# Patient Record
Sex: Female | Born: 1965 | Race: Black or African American | Hispanic: No | Marital: Married | Smoking: Current every day smoker
Health system: Southern US, Community
[De-identification: ages and names within clinical notes are randomized; demographics above are authoritative.]

## PROBLEM LIST (undated history)

## (undated) DIAGNOSIS — F3110 Bipolar disorder, current episode manic without psychotic features, unspecified: Secondary | ICD-10-CM

## (undated) DIAGNOSIS — J45909 Unspecified asthma, uncomplicated: Secondary | ICD-10-CM

## (undated) DIAGNOSIS — R918 Other nonspecific abnormal finding of lung field: Secondary | ICD-10-CM

## (undated) DIAGNOSIS — F1910 Other psychoactive substance abuse, uncomplicated: Secondary | ICD-10-CM

## (undated) DIAGNOSIS — J449 Chronic obstructive pulmonary disease, unspecified: Secondary | ICD-10-CM

## (undated) DIAGNOSIS — F32A Depression, unspecified: Secondary | ICD-10-CM

## (undated) DIAGNOSIS — F329 Major depressive disorder, single episode, unspecified: Secondary | ICD-10-CM

## (undated) DIAGNOSIS — R569 Unspecified convulsions: Secondary | ICD-10-CM

## (undated) DIAGNOSIS — T7840XA Allergy, unspecified, initial encounter: Secondary | ICD-10-CM

## (undated) DIAGNOSIS — Z720 Tobacco use: Secondary | ICD-10-CM

## (undated) DIAGNOSIS — F419 Anxiety disorder, unspecified: Secondary | ICD-10-CM

## (undated) DIAGNOSIS — F319 Bipolar disorder, unspecified: Secondary | ICD-10-CM

## (undated) DIAGNOSIS — E785 Hyperlipidemia, unspecified: Secondary | ICD-10-CM

## (undated) HISTORY — PX: TUBAL LIGATION: SHX77

## (undated) HISTORY — DX: Tobacco use: Z72.0

## (undated) HISTORY — DX: Unspecified convulsions: R56.9

## (undated) HISTORY — DX: Allergy, unspecified, initial encounter: T78.40XA

## (undated) HISTORY — DX: Depression, unspecified: F32.A

## (undated) HISTORY — DX: Chronic obstructive pulmonary disease, unspecified: J44.9

## (undated) HISTORY — DX: Other psychoactive substance abuse, uncomplicated: F19.10

## (undated) HISTORY — PX: ABDOMINAL HYSTERECTOMY: SHX81

## (undated) HISTORY — DX: Anxiety disorder, unspecified: F41.9

## (undated) HISTORY — DX: Other nonspecific abnormal finding of lung field: R91.8

## (undated) HISTORY — DX: Major depressive disorder, single episode, unspecified: F32.9

---

## 1994-02-13 DIAGNOSIS — F319 Bipolar disorder, unspecified: Secondary | ICD-10-CM

## 1994-02-13 DIAGNOSIS — F3110 Bipolar disorder, current episode manic without psychotic features, unspecified: Secondary | ICD-10-CM

## 1994-02-13 HISTORY — DX: Bipolar disorder, unspecified: F31.9

## 1994-02-13 HISTORY — DX: Bipolar disorder, current episode manic without psychotic features, unspecified: F31.10

## 2004-04-05 ENCOUNTER — Ambulatory Visit: Payer: Self-pay | Admitting: Unknown Physician Specialty

## 2004-04-13 ENCOUNTER — Ambulatory Visit: Payer: Self-pay | Admitting: Unknown Physician Specialty

## 2004-05-14 ENCOUNTER — Ambulatory Visit: Payer: Self-pay | Admitting: Unknown Physician Specialty

## 2005-02-21 ENCOUNTER — Ambulatory Visit: Payer: Self-pay | Admitting: Unknown Physician Specialty

## 2005-03-16 ENCOUNTER — Ambulatory Visit: Payer: Self-pay | Admitting: Unknown Physician Specialty

## 2005-09-29 ENCOUNTER — Other Ambulatory Visit: Payer: Self-pay

## 2005-09-29 ENCOUNTER — Inpatient Hospital Stay: Payer: Self-pay | Admitting: Psychiatry

## 2005-11-14 ENCOUNTER — Inpatient Hospital Stay: Payer: Self-pay | Admitting: Psychiatry

## 2011-03-07 ENCOUNTER — Emergency Department: Payer: Self-pay | Admitting: Emergency Medicine

## 2012-10-03 ENCOUNTER — Emergency Department: Payer: Self-pay | Admitting: Emergency Medicine

## 2012-10-04 ENCOUNTER — Encounter (HOSPITAL_COMMUNITY): Payer: Self-pay | Admitting: Emergency Medicine

## 2012-10-04 ENCOUNTER — Emergency Department (HOSPITAL_COMMUNITY)
Admission: EM | Admit: 2012-10-04 | Discharge: 2012-10-04 | Disposition: A | Discharge status: Home / Self Care | Payer: Self-pay | Attending: Emergency Medicine | Admitting: Emergency Medicine

## 2012-10-04 DIAGNOSIS — R21 Rash and other nonspecific skin eruption: Secondary | ICD-10-CM | POA: Insufficient documentation

## 2012-10-04 DIAGNOSIS — F17200 Nicotine dependence, unspecified, uncomplicated: Secondary | ICD-10-CM | POA: Insufficient documentation

## 2012-10-04 DIAGNOSIS — R6889 Other general symptoms and signs: Secondary | ICD-10-CM | POA: Insufficient documentation

## 2012-10-04 DIAGNOSIS — R059 Cough, unspecified: Secondary | ICD-10-CM | POA: Insufficient documentation

## 2012-10-04 DIAGNOSIS — R05 Cough: Secondary | ICD-10-CM

## 2012-10-04 DIAGNOSIS — R55 Syncope and collapse: Secondary | ICD-10-CM | POA: Insufficient documentation

## 2012-10-04 DIAGNOSIS — Z8659 Personal history of other mental and behavioral disorders: Secondary | ICD-10-CM | POA: Insufficient documentation

## 2012-10-04 DIAGNOSIS — Z8679 Personal history of other diseases of the circulatory system: Secondary | ICD-10-CM | POA: Insufficient documentation

## 2012-10-04 DIAGNOSIS — R054 Cough syncope: Secondary | ICD-10-CM

## 2012-10-04 DIAGNOSIS — J45901 Unspecified asthma with (acute) exacerbation: Secondary | ICD-10-CM | POA: Insufficient documentation

## 2012-10-04 HISTORY — DX: Unspecified asthma, uncomplicated: J45.909

## 2012-10-04 HISTORY — DX: Bipolar disorder, current episode manic without psychotic features, unspecified: F31.10

## 2012-10-04 HISTORY — DX: Bipolar disorder, unspecified: F31.9

## 2012-10-04 LAB — GLUCOSE, CAPILLARY: Glucose-Capillary: 127 mg/dL — ABNORMAL HIGH (ref 70–99)

## 2012-10-04 MED ORDER — ALBUTEROL SULFATE HFA 108 (90 BASE) MCG/ACT IN AERS
2.0000 | INHALATION_SPRAY | Freq: Once | RESPIRATORY_TRACT | Status: AC
  Administered 2012-10-04: 2 via RESPIRATORY_TRACT
  Filled 2012-10-04 (×2): qty 6.7

## 2012-10-04 NOTE — ED Provider Notes (Signed)
CSN: [id number]     Arrival date & time 10/04/12  0831 History     First MD Initiated Contact with Patient 10/04/12 0831     Chief Complaint  Patient presents with  . Loss of Consciousness   (Consider location/radiation/quality/duration/timing/severity/associated sxs/prior Treatment) Patient is a 47 y.o. female presenting with syncope. The history is provided by the patient.  Loss of Consciousness Episode history:  Single Most recent episode:  Today Duration: less than 1 minute. Timing: once. Progression:  Resolved Chronicity:  Recurrent Context comment:  Coughing after drinking coffee Witnessed: yes   Associated symptoms: no chest pain, no fever, no focal weakness, no headaches, no nausea, no recent injury, no shortness of breath, no vomiting and no weakness     Past Medical History  Diagnosis Date  . Asthma   . Manic depressive illness 1996  . Bipolar affective, manic 1996   History reviewed. No pertinent past surgical history. History reviewed. No pertinent family history. History  Substance Use Topics  . Smoking status: Current Every Day Smoker -- 0.50 packs/day    Types: Cigarettes  . Smokeless tobacco: Never Used  . Alcohol Use: No   OB History   Grav Para Term Preterm Abortions TAB SAB Ect Mult Living                 Review of Systems  Constitutional: Negative for fever.  Respiratory: Positive for cough and wheezing (had wheezing last night that has improved after going to OSH ER). Negative for shortness of breath.   Cardiovascular: Positive for syncope. Negative for chest pain.  Gastrointestinal: Negative for nausea, vomiting and abdominal pain.  Genitourinary: Negative for menstrual problem.  Neurological: Negative for focal weakness, weakness and headaches.  All other systems reviewed and are negative.    Allergies  Review of patient's allergies indicates not on file.  Home Medications  No current outpatient prescriptions on file. BP 119/80   Pulse 82  Temp(Src) 97.9 F (36.6 C) (Oral)  Resp 16  SpO2 98% Physical Exam  Vitals reviewed. Constitutional: She is oriented to person, place, and time. She appears well-developed and well-nourished.  HENT:  Head: Normocephalic and atraumatic.  Right Ear: External ear normal.  Left Ear: External ear normal.  Nose: Nose normal.  Mouth/Throat: Oropharynx is clear and moist.  Eyes: EOM are normal. Pupils are equal, round, and reactive to light. Right eye exhibits no discharge. Left eye exhibits no discharge.  Cardiovascular: Normal rate, regular rhythm, normal heart sounds and intact distal pulses.   Pulmonary/Chest: Effort normal. She has wheezes (scant wheezes).  Abdominal: Soft. There is no tenderness.  Neurological: She is alert and oriented to person, place, and time. She has normal strength. No cranial nerve deficit or sensory deficit. She exhibits normal muscle tone. GCS eye subscore is 4. GCS verbal subscore is 5. GCS motor subscore is 6.  Skin: Skin is warm and dry.    ED Course   Procedures (including critical care time)  Labs Reviewed  GLUCOSE, CAPILLARY - Abnormal; Notable for the following:    Glucose-Capillary 127 (*)    All other components within normal limits    Date: 10/04/2012  Rate: 80  Rhythm: normal sinus rhythm  QRS Axis: normal  Intervals: normal  ST/T Wave abnormalities: nonspecific ST/T changes  Conduction Disutrbances:right bundle branch block  Narrative Interpretation: RBBB  Old EKG Reviewed: none available   No results found. 1. Cough syncope     MDM  47 year old female with recurrent syncope  after coughing. She says she was choking on coffee and had prolonged coughing and then felt lightheaded and passed out. Did not injure herself and feels back to her normal self now. Denies any chest pain or shortness of breath. Neurologic exam normal. Is consistent with vasovagal syncope. Her EKG shows a right bundle branch block and there are no old ones  to confirm if new or old. Tried to get old EKG from OSH, but patient feels well and does not want to wait. I discussed that while it is likely old and seems unlikely to be causing her sx today, she needs to follow up closely with her PCP. She agrees and will call today.  Audree Camel, MD 10/04/12 [phone]

## 2012-10-04 NOTE — ED Notes (Addendum)
According to EMS, patient was at work and choked on some coffee, passed out.  The patient said it has happened before where she chokes on something she is eating or drinking and passes out.  The patient has been checked out by her PCP and they told her nothing is wrong with her.  Patient was evaluated yesterday for a severe asthma attack at [facility].  She is out of her medication and that is why this happened.  EMS advised her lung sounds are clear.

## 2012-10-04 NOTE — ED Notes (Signed)
Patient says she was drinking coffee and choked.  The patient says she knows when she is getting ready to choke because she can't speak, she cannot do anything then "something goes to her brain and she passes out".  She feels light headed when it happens.  She says this has happened before.  EMS transported her to be evaluated.

## 2012-10-04 NOTE — ED Notes (Signed)
EKG given to Dr. Criss Alvine. Copy placed in pt chart.

## 2012-12-17 DIAGNOSIS — R569 Unspecified convulsions: Secondary | ICD-10-CM | POA: Insufficient documentation

## 2012-12-17 DIAGNOSIS — J449 Chronic obstructive pulmonary disease, unspecified: Secondary | ICD-10-CM | POA: Insufficient documentation

## 2012-12-20 ENCOUNTER — Ambulatory Visit: Payer: Self-pay

## 2013-02-24 ENCOUNTER — Ambulatory Visit: Payer: Self-pay

## 2013-11-12 ENCOUNTER — Emergency Department: Payer: Self-pay | Admitting: Emergency Medicine

## 2013-11-12 LAB — DRUG SCREEN, URINE

## 2013-11-12 LAB — CBC
HCT: 39.1 % (ref 35.0–47.0)
HGB: 12.3 g/dL (ref 12.0–16.0)
MCH: 28.5 pg (ref 26.0–34.0)
MCHC: 31.6 g/dL — ABNORMAL LOW (ref 32.0–36.0)
MCV: 90 fL (ref 80–100)
Platelet: 308 10*3/uL (ref 150–440)
RBC: 4.32 10*6/uL (ref 3.80–5.20)
RDW: 13.4 % (ref 11.5–14.5)
WBC: 10.1 10*3/uL (ref 3.6–11.0)

## 2013-11-12 LAB — COMPREHENSIVE METABOLIC PANEL
Albumin: 3.5 g/dL (ref 3.4–5.0)
Alkaline Phosphatase: 90 U/L
Anion Gap: 6 — ABNORMAL LOW (ref 7–16)
BUN: 9 mg/dL (ref 7–18)
Bilirubin,Total: <0.1 mg/dL — ABNORMAL LOW (ref 0.2–1.0)
Calcium, Total: 9 mg/dL (ref 8.5–10.1)
Chloride: 107 mmol/L (ref 98–107)
Co2: 28 mmol/L (ref 21–32)
Creatinine: 1.08 mg/dL (ref 0.60–1.30)
EGFR (African American): >60
EGFR (Non-African Amer.): 58 — ABNORMAL LOW
Glucose: 123 mg/dL — ABNORMAL HIGH (ref 65–99)
Osmolality: 281 (ref 275–301)
Potassium: 4.1 mmol/L (ref 3.5–5.1)
SGOT(AST): 22 U/L (ref 15–37)
SGPT (ALT): 26 U/L
Sodium: 141 mmol/L (ref 136–145)
Total Protein: 7.4 g/dL (ref 6.4–8.2)

## 2013-11-12 LAB — URINALYSIS, COMPLETE
Bacteria: NONE SEEN
Bilirubin,UR: NEGATIVE
Blood: NEGATIVE
Glucose,UR: NEGATIVE mg/dL (ref 0–75)
Ketone: NEGATIVE
Leukocyte Esterase: NEGATIVE
Nitrite: NEGATIVE
Ph: 5 (ref 4.5–8.0)
Protein: NEGATIVE
RBC,UR: 1 /HPF (ref 0–5)
Specific Gravity: 1.018 (ref 1.003–1.030)
Squamous Epithelial: 1
WBC UR: NONE SEEN /HPF (ref 0–5)

## 2013-11-12 LAB — ETHANOL: Ethanol: <3 mg/dL

## 2014-01-15 LAB — CBC AND DIFFERENTIAL
HCT: 37 % (ref 36–46)
Hemoglobin: 12.3 g/dL (ref 12.0–16.0)
Neutrophils Absolute: 5 /uL
Platelets: 287 10*3/uL (ref 150–399)
WBC: 8.8 10^3/mL

## 2014-01-15 LAB — TSH: TSH: 1.45 u[IU]/mL (ref 0.41–5.90)

## 2014-01-15 LAB — LIPID PANEL
Cholesterol: 169 mg/dL (ref 0–200)
HDL: 41 mg/dL (ref 35–70)
LDL Cholesterol: 109 mg/dL
Triglycerides: 96 mg/dL (ref 40–160)

## 2014-01-15 LAB — HEPATIC FUNCTION PANEL
ALT: 14 U/L (ref 7–35)
AST: 13 U/L (ref 13–35)
Alkaline Phosphatase: 79 U/L (ref 25–125)
Bilirubin, Total: 0.2 mg/dL

## 2014-01-15 LAB — BASIC METABOLIC PANEL
BUN: 11 mg/dL (ref 4–21)
Creatinine: 1.1 mg/dL (ref 0.5–1.1)
Glucose: 93 mg/dL
Potassium: 4.8 mmol/L (ref 3.4–5.3)
Sodium: 142 mmol/L (ref 137–147)

## 2014-01-15 LAB — HEMOGLOBIN A1C: Hemoglobin A1C: 6.8

## 2014-04-03 DIAGNOSIS — E119 Type 2 diabetes mellitus without complications: Secondary | ICD-10-CM | POA: Insufficient documentation

## 2015-04-15 ENCOUNTER — Telehealth: Payer: Self-pay

## 2015-04-15 NOTE — Telephone Encounter (Signed)
Wants to schedule appointment needs to update eligibility first. Patient was given information.

## 2015-05-03 ENCOUNTER — Emergency Department
Admission: EM | Admit: 2015-05-03 | Discharge: 2015-05-03 | Disposition: A | Discharge status: Home / Self Care | Payer: Self-pay | Attending: Emergency Medicine | Admitting: Emergency Medicine

## 2015-05-03 ENCOUNTER — Encounter: Payer: Self-pay | Admitting: Emergency Medicine

## 2015-05-03 DIAGNOSIS — Y9389 Activity, other specified: Secondary | ICD-10-CM | POA: Insufficient documentation

## 2015-05-03 DIAGNOSIS — S39012A Strain of muscle, fascia and tendon of lower back, initial encounter: Secondary | ICD-10-CM | POA: Insufficient documentation

## 2015-05-03 DIAGNOSIS — F17210 Nicotine dependence, cigarettes, uncomplicated: Secondary | ICD-10-CM | POA: Insufficient documentation

## 2015-05-03 DIAGNOSIS — Y929 Unspecified place or not applicable: Secondary | ICD-10-CM | POA: Insufficient documentation

## 2015-05-03 DIAGNOSIS — Z7951 Long term (current) use of inhaled steroids: Secondary | ICD-10-CM | POA: Insufficient documentation

## 2015-05-03 DIAGNOSIS — Y990 Civilian activity done for income or pay: Secondary | ICD-10-CM | POA: Insufficient documentation

## 2015-05-03 DIAGNOSIS — X501XXA Overexertion from prolonged static or awkward postures, initial encounter: Secondary | ICD-10-CM | POA: Insufficient documentation

## 2015-05-03 DIAGNOSIS — F3110 Bipolar disorder, current episode manic without psychotic features, unspecified: Secondary | ICD-10-CM | POA: Insufficient documentation

## 2015-05-03 DIAGNOSIS — J45909 Unspecified asthma, uncomplicated: Secondary | ICD-10-CM | POA: Insufficient documentation

## 2015-05-03 MED ORDER — NAPROXEN 500 MG PO TABS: 500.0000 mg | ORAL_TABLET | Freq: Two times a day (BID) | ORAL | Status: DC

## 2015-05-03 MED ORDER — KETOROLAC TROMETHAMINE 30 MG/ML IJ SOLN
30.0000 mg | Freq: Once | INTRAMUSCULAR | Status: AC
  Administered 2015-05-03: 30 mg via INTRAMUSCULAR
  Filled 2015-05-03: qty 1

## 2015-05-03 NOTE — ED Provider Notes (Signed)
[facility] Emergency Department Provider Note  ____________________________________________  Time seen: Approximately 9:30 AM  I have reviewed the triage vital signs and the nursing notes.   HISTORY  Chief Complaint Back Pain    HPI Erica Gomez is a 50 y.o. female , NAD, presents to the emergency department with lower back pain that began yesterday while working. States she bent down to get something from the floor and felt a pop and pain in the lower back. Has not had any radiation of pain. Hasn't noted any redness or rashes. Denies any history of lower back injuries or chronic pain. Has not taken anything for her pain at this time. Does note the pain is moderate in nature. Does not endorse any numbness, weakness, tingling. Has not had loss of bowel or bladder control. Denies saddle paresthesias.   Past Medical History  Diagnosis Date  . Asthma   . Manic depressive illness ([place]) 1996  . Bipolar affective, manic ([place]) 1996    There are no active problems to display for this patient.   No past surgical history on file.  Current Outpatient Rx  Name  Route  Sig  Dispense  Refill  . albuterol (PROVENTIL HFA;VENTOLIN HFA) 108 (90 BASE) MCG/ACT inhaler   Inhalation   Inhale 2 puffs into the lungs every 6 (six) hours as needed for wheezing.         . citalopram (CELEXA) 20 MG tablet   Oral   Take 20 mg by mouth daily.         . Fluticasone-Salmeterol (ADVAIR) 500-50 MCG/DOSE AEPB   Inhalation   Inhale 1 puff into the lungs every 12 (twelve) hours.         . naproxen (NAPROSYN) 500 MG tablet   Oral   Take 1 tablet (500 mg total) by mouth 2 (two) times daily with a meal.   14 tablet   0   . Omega-3 Fatty Acids (FISH OIL PO)   Oral   Take 1 tablet by mouth daily.         . Prenatal Vit-Fe Fumarate-FA (PRENATAL PO)   Oral   Take 1 tablet by mouth daily.         . risperiDONE (RISPERDAL) 2 MG tablet   Oral   Take 2 mg by mouth  daily.           Allergies Review of patient's allergies indicates no known allergies.  No family history on file.  Social History Social History  Substance Use Topics  . Smoking status: Current Every Day Smoker -- 0.50 packs/day    Types: Cigarettes  . Smokeless tobacco: Never Used  . Alcohol Use: No     Review of Systems  Constitutional: No fever/chills Cardiovascular: No chest pain. Respiratory:  No shortness of breath. No wheezing.  Gastrointestinal: No abdominal pain.  No nausea, vomiting. Genitourinary: Negative for dysuria, hematuria. Musculoskeletal: Positive for lower back pain.  Skin: Negative for rash. Neurological: Negative for headaches, focal weakness or numbness. No tingling. No saddle paresthesias. 10-point ROS otherwise negative.  ____________________________________________   PHYSICAL EXAM:  VITAL SIGNS: ED Triage Vitals  Enc Vitals Group     BP 05/03/15 0859 116/70 mmHg     Pulse Rate 05/03/15 0859 87     Resp 05/03/15 0859 18     Temp 05/03/15 0859 98.2 F (36.8 C)     Temp Source 05/03/15 0859 Oral     SpO2 05/03/15 0859 100 %  Weight 05/03/15 0859 170 lb (77.111 kg)     Height 05/03/15 0859 5\' 6"  (1.676 m)     Head Cir --      Peak Flow --      Pain Score 05/03/15 0848 8     Pain Loc --      Pain Edu? --      Excl. in [place]? --     Constitutional: Alert and oriented. Well appearing and in no acute distress. Eyes: Conjunctivae are normal. PERRL. EOMI without pain.  Head: Atraumatic. Neck: No cervical spine tenderness to palpation.  Supple with full range of motion Cardiovascular:  Good peripheral circulation. Respiratory: Normal respiratory effort without tachypnea or retractions.  Musculoskeletal: Mild tenderness to palpation about the lower back diffusely. No muscle spasms appreciated to palpation. Full range of motion of the lumbar spine with mild tenderness with left lateral flexion and mid line extension.  No joint effusions.  Stiff movement out of the bed and when walking due to pain. Neurologic:  Normal speech and language. No gross focal neurologic deficits are appreciated.  Skin:  Skin is warm, dry and intact. No rash, skin source noted. Psychiatric: Mood and affect are normal. Speech and behavior are normal. Patient exhibits appropriate insight and judgement.   ____________________________________________   LABS  None  ____________________________________________  EKG  None ____________________________________________  RADIOLOGY  None ____________________________________________    PROCEDURES  Procedure(s) performed: None    Medications  ketorolac (TORADOL) 30 MG/ML injection 30 mg (30 mg Intramuscular Given 05/03/15 0956)   Patient notes improvement in pain after administration of Toradol. Patient is also visualized walking without pain or hesitation which is improved.  ____________________________________________   INITIAL IMPRESSION / ASSESSMENT AND PLAN / ED COURSE  Patient's diagnosis is consistent with lower back strain. Patient will be discharged home with prescriptions for naproxen to take as directed as needed for pain. Patient will apply ice or heat 20 minutes 3-4 times daily as needed for lower back pain. Patient given a note for work restricting her to not lift anything over 25 pounds over the next 5 days. Patient is to follow up with [facility] or other outpatient medical facility as deemed appropriate by [employer] if symptoms persist past this treatment course. Patient is given ED precautions to return to the ED for any worsening or new symptoms.    ____________________________________________  FINAL CLINICAL IMPRESSION(S) / ED DIAGNOSES  Final diagnoses:  Strain of muscle, fascia and tendon of lower back, initial encounter      NEW MEDICATIONS STARTED DURING THIS VISIT:  New Prescriptions   NAPROXEN (NAPROSYN) 500 MG TABLET    Take 1 tablet (500  mg total) by mouth 2 (two) times daily with a meal.         Braxton Feathers, PA-C 05/03/15 1025  Lisa Roca, MD 05/03/15 1552

## 2015-05-03 NOTE — ED Notes (Signed)
Injured back at work yesterday while bending.

## 2015-05-03 NOTE — ED Notes (Signed)
Developed back pain yesterday at work  States she bent down and felt pain to left lower back   Pain is non radiating but does increase with movement

## 2015-05-03 NOTE — Discharge Instructions (Signed)
Back Injury Prevention Back injuries can be very painful. They can also be difficult to heal. After having one back injury, you are more likely to injure your back again. It is important to learn how to avoid injuring or re-injuring your back. The following tips can help you to prevent a back injury. WHAT SHOULD I KNOW ABOUT PHYSICAL FITNESS?  Exercise for 30 minutes per day on most days of the week or as directed by your health care provider. Make sure to:  Do aerobic exercises, such as walking, jogging, biking, or swimming.  Do exercises that increase balance and strength, such as tai chi and yoga. These can decrease your risk of falling and injuring your back.  Do stretching exercises to help with flexibility.  Try to develop strong abdominal muscles. Your abdominal muscles provide a lot of the support that is needed by your back.  Maintain a healthy weight. This helps to decrease your risk of a back injury. WHAT SHOULD I KNOW ABOUT MY DIET?  Talk with your health care provider about your overall diet. Take supplements and vitamins only as directed by your health care provider.  Talk with your health care provider about how much calcium and vitamin D you need each day. These nutrients help to prevent weakening of the bones (osteoporosis). Osteoporosis can cause broken (fractured) bones, which lead to back pain.  Include good sources of calcium in your diet, such as dairy products, green leafy vegetables, and products that have had calcium added to them (fortified).  Include good sources of vitamin D in your diet, such as milk and foods that are fortified with vitamin D. WHAT SHOULD I KNOW ABOUT MY POSTURE?  Sit up straight and stand up straight. Avoid leaning forward when you sit or hunching over when you stand.  Choose chairs that have good low-back (lumbar) support.  If you work at a desk, sit close to it so you do not need to lean over. Keep your chin tucked in. Keep your neck  drawn back, and keep your elbows bent at a right angle. Your arms should look like the letter "L."  Sit high and close to the steering wheel when you drive. Add a lumbar support to your car seat, if needed.  Avoid sitting or standing in one position for very long. Take breaks to get up, stretch, and walk around at least one time every hour. Take breaks every hour if you are driving for long periods of time.  Sleep on your side with your knees slightly bent, or sleep on your back with a pillow under your knees. Do not lie on the front of your body to sleep. WHAT SHOULD I KNOW ABOUT LIFTING, TWISTING, AND REACHING? Lifting and Heavy Lifting  Avoid heavy lifting, especially repetitive heavy lifting. If you must do heavy lifting:  Stretch before lifting.  Work slowly.  Rest between lifts.  Use a tool such as a cart or a dolly to move objects if one is available.  Make several small trips instead of carrying one heavy load.  Ask for help when you need it, especially when moving big objects.  Follow these steps when lifting:  Stand with your feet shoulder-width apart.  Get as close to the object as you can. Do not try to pick up a heavy object that is far from your body.  Use handles or lifting straps if they are available.  Bend at your knees. Squat down, but keep your heels off the floor.  Keep your shoulders pulled back, your chin tucked in, and your back straight.  Lift the object slowly while you tighten the muscles in your legs, abdomen, and buttocks. Keep the object as close to the center of your body as possible.  Follow these steps when putting down a heavy load:  Stand with your feet shoulder-width apart.  Lower the object slowly while you tighten the muscles in your legs, abdomen, and buttocks. Keep the object as close to the center of your body as possible.  Keep your shoulders pulled back, your chin tucked in, and your back straight.  Bend at your knees. Squat  down, but keep your heels off the floor.  Use handles or lifting straps if they are available. Twisting and Reaching  Avoid lifting heavy objects above your waist.  Do not twist at your waist while you are lifting or carrying a load. If you need to turn, move your feet.  Do not bend over without bending at your knees.  Avoid reaching over your head, across a table, or for an object on a high surface. WHAT ARE SOME OTHER TIPS?  Avoid wet floors and icy ground. Keep sidewalks clear of ice to prevent falls.  Do not sleep on a mattress that is too soft or too hard.  Keep items that are used frequently within easy reach.  Put heavier objects on shelves at waist level, and put lighter objects on lower or higher shelves.  Find ways to decrease your stress, such as exercise, massage, or relaxation techniques. Stress can build up in your muscles. Tense muscles are more vulnerable to injury.  Talk with your health care provider if you feel anxious or depressed. These conditions can make back pain worse.  Wear flat heel shoes with cushioned soles.  Avoid sudden movements.  Use both shoulder straps when carrying a backpack.  Do not use any tobacco products, including cigarettes, chewing tobacco, or electronic cigarettes. If you need help quitting, ask your health care provider.   This information is not intended to replace advice given to you by your health care provider. Make sure you discuss any questions you have with your health care provider.   Document Released: 03/09/2004 Document Revised: 06/16/2014 Document Reviewed: 02/03/2014 [employer] Interactive Patient Education 2016 [place] Strain With Rehab A strain is an injury in which a tendon or muscle is torn. The muscles and tendons of the lower back are vulnerable to strains. However, these muscles and tendons are very strong and require a great force to be injured. Strains are classified into three categories. Grade  1 strains cause pain, but the tendon is not lengthened. Grade 2 strains include a lengthened ligament, due to the ligament being stretched or partially ruptured. With grade 2 strains there is still function, although the function may be decreased. Grade 3 strains involve a complete tear of the tendon or muscle, and function is usually impaired. SYMPTOMS   Pain in the lower back.  Pain that affects one side more than the other.  Pain that gets worse with movement and may be felt in the hip, buttocks, or back of the thigh.  Muscle spasms of the muscles in the back.  Swelling along the muscles of the back.  Loss of strength of the back muscles.  Crackling sound (crepitation) when the muscles are touched. CAUSES  Lower back strains occur when a force is placed on the muscles or tendons that is greater than they can handle. Common  causes of injury include:  Prolonged overuse of the muscle-tendon units in the lower back, usually from incorrect posture.  A single violent injury or force applied to the back. RISK INCREASES WITH:  Sports that involve twisting forces on the spine or a lot of bending at the waist (football, rugby, weightlifting, bowling, golf, tennis, speed skating, racquetball, swimming, running, gymnastics, diving).  Poor strength and flexibility.  Failure to warm up properly before activity.  Family history of lower back pain or disk disorders.  Previous back injury or surgery (especially fusion).  Poor posture with lifting, especially heavy objects.  Prolonged sitting, especially with poor posture. PREVENTION   Learn and use proper posture when sitting or lifting (maintain proper posture when sitting, lift using the knees and legs, not at the waist).  Warm up and stretch properly before activity.  Allow for adequate recovery between workouts.  Maintain physical fitness:  Strength, flexibility, and endurance.  Cardiovascular fitness. PROGNOSIS  If treated  properly, lower back strains usually heal within 6 weeks. RELATED COMPLICATIONS   Recurring symptoms, resulting in a chronic problem.  Chronic inflammation, scarring, and partial muscle-tendon tear.  Delayed healing or resolution of symptoms.  Prolonged disability. TREATMENT  Treatment first involves the use of ice and medicine, to reduce pain and inflammation. The use of strengthening and stretching exercises may help reduce pain with activity. These exercises may be performed at home or with a therapist. Severe injuries may require referral to a therapist for further evaluation and treatment, such as ultrasound. Your caregiver may advise that you wear a back brace or corset, to help reduce pain and discomfort. Often, prolonged bed rest results in greater harm then benefit. Corticosteroid injections may be recommended. However, these should be reserved for the most serious cases. It is important to avoid using your back when lifting objects. At night, sleep on your back on a firm mattress with a pillow placed under your knees. If non-surgical treatment is unsuccessful, surgery may be needed.  MEDICATION   If pain medicine is needed, nonsteroidal anti-inflammatory medicines (aspirin and ibuprofen), or other minor pain relievers (acetaminophen), are often advised.  Do not take pain medicine for 7 days before surgery.  Prescription pain relievers may be given, if your caregiver thinks they are needed. Use only as directed and only as much as you need.  Ointments applied to the skin may be helpful.  Corticosteroid injections may be given by your caregiver. These injections should be reserved for the most serious cases, because they may only be given a certain number of times. HEAT AND COLD  Cold treatment (icing) should be applied for 10 to 15 minutes every 2 to 3 hours for inflammation and pain, and immediately after activity that aggravates your symptoms. Use ice packs or an ice  massage.  Heat treatment may be used before performing stretching and strengthening activities prescribed by your caregiver, physical therapist, or athletic trainer. Use a heat pack or a warm water soak. SEEK MEDICAL CARE IF:   Symptoms get worse or do not improve in 2 to 4 weeks, despite treatment.  You develop numbness, weakness, or loss of bowel or bladder function.  New, unexplained symptoms develop. (Drugs used in treatment may produce side effects.) EXERCISES  RANGE OF MOTION (ROM) AND STRETCHING EXERCISES - Low Back Strain Most people with lower back pain will find that their symptoms get worse with excessive bending forward (flexion) or arching at the lower back (extension). The exercises which will help resolve  your symptoms will focus on the opposite motion.  Your physician, physical therapist or athletic trainer will help you determine which exercises will be most helpful to resolve your lower back pain. Do not complete any exercises without first consulting with your caregiver. Discontinue any exercises which make your symptoms worse until you speak to your caregiver.  If you have pain, numbness or tingling which travels down into your buttocks, leg or foot, the goal of the therapy is for these symptoms to move closer to your back and eventually resolve. Sometimes, these leg symptoms will get better, but your lower back pain may worsen. This is typically an indication of progress in your rehabilitation. Be very alert to any changes in your symptoms and the activities in which you participated in the 24 hours prior to the change. Sharing this information with your caregiver will allow him/her to most efficiently treat your condition.  These exercises may help you when beginning to rehabilitate your injury. Your symptoms may resolve with or without further involvement from your physician, physical therapist or athletic trainer. While completing these exercises, remember:  Restoring tissue  flexibility helps normal motion to return to the joints. This allows healthier, less painful movement and activity.  An effective stretch should be held for at least 30 seconds.  A stretch should never be painful. You should only feel a gentle lengthening or release in the stretched tissue. FLEXION RANGE OF MOTION AND STRETCHING EXERCISES: STRETCH - Flexion, Single Knee to Chest   Lie on a firm bed or floor with both legs extended in front of you.  Keeping one leg in contact with the floor, bring your opposite knee to your chest. Hold your leg in place by either grabbing behind your thigh or at your knee.  Pull until you feel a gentle stretch in your lower back. Hold __________ seconds.  Slowly release your grasp and repeat the exercise with the opposite side. Repeat __________ times. Complete this exercise __________ times per day.  STRETCH - Flexion, Double Knee to Chest   Lie on a firm bed or floor with both legs extended in front of you.  Keeping one leg in contact with the floor, bring your opposite knee to your chest.  Tense your stomach muscles to support your back and then lift your other knee to your chest. Hold your legs in place by either grabbing behind your thighs or at your knees.  Pull both knees toward your chest until you feel a gentle stretch in your lower back. Hold __________ seconds.  Tense your stomach muscles and slowly return one leg at a time to the floor. Repeat __________ times. Complete this exercise __________ times per day.  STRETCH - Low Trunk Rotation  Lie on a firm bed or floor. Keeping your legs in front of you, bend your knees so they are both pointed toward the ceiling and your feet are flat on the floor.  Extend your arms out to the side. This will stabilize your upper body by keeping your shoulders in contact with the floor.  Gently and slowly drop both knees together to one side until you feel a gentle stretch in your lower back. Hold for  __________ seconds.  Tense your stomach muscles to support your lower back as you bring your knees back to the starting position. Repeat the exercise to the other side. Repeat __________ times. Complete this exercise __________ times per day  EXTENSION RANGE OF MOTION AND FLEXIBILITY EXERCISES: STRETCH - Extension, Prone  on Elbows   Lie on your stomach on the floor, a bed will be too soft. Place your palms about shoulder width apart and at the height of your head.  Place your elbows under your shoulders. If this is too painful, stack pillows under your chest.  Allow your body to relax so that your hips drop lower and make contact more completely with the floor.  Hold this position for __________ seconds.  Slowly return to lying flat on the floor. Repeat __________ times. Complete this exercise __________ times per day.  RANGE OF MOTION - Extension, Prone Press Ups  Lie on your stomach on the floor, a bed will be too soft. Place your palms about shoulder width apart and at the height of your head.  Keeping your back as relaxed as possible, slowly straighten your elbows while keeping your hips on the floor. You may adjust the placement of your hands to maximize your comfort. As you gain motion, your hands will come more underneath your shoulders.  Hold this position __________ seconds.  Slowly return to lying flat on the floor. Repeat __________ times. Complete this exercise __________ times per day.  RANGE OF MOTION- Quadruped, Neutral Spine   Assume a hands and knees position on a firm surface. Keep your hands under your shoulders and your knees under your hips. You may place padding under your knees for comfort.  Drop your head and point your tail bone toward the ground below you. This will round out your lower back like an angry cat. Hold this position for __________ seconds.  Slowly lift your head and release your tail bone so that your back sags into a large arch, like an old  horse.  Hold this position for __________ seconds.  Repeat this until you feel limber in your lower back.  Now, find your "sweet spot." This will be the most comfortable position somewhere between the two previous positions. This is your neutral spine. Once you have found this position, tense your stomach muscles to support your lower back.  Hold this position for __________ seconds. Repeat __________ times. Complete this exercise __________ times per day.  STRENGTHENING EXERCISES - Low Back Strain These exercises may help you when beginning to rehabilitate your injury. These exercises should be done near your "sweet spot." This is the neutral, low-back arch, somewhere between fully rounded and fully arched, that is your least painful position. When performed in this safe range of motion, these exercises can be used for people who have either a flexion or extension based injury. These exercises may resolve your symptoms with or without further involvement from your physician, physical therapist or athletic trainer. While completing these exercises, remember:   Muscles can gain both the endurance and the strength needed for everyday activities through controlled exercises.  Complete these exercises as instructed by your physician, physical therapist or athletic trainer. Increase the resistance and repetitions only as guided.  You may experience muscle soreness or fatigue, but the pain or discomfort you are trying to eliminate should never worsen during these exercises. If this pain does worsen, stop and make certain you are following the directions exactly. If the pain is still present after adjustments, discontinue the exercise until you can discuss the trouble with your caregiver. STRENGTHENING - Deep Abdominals, Pelvic Tilt  Lie on a firm bed or floor. Keeping your legs in front of you, bend your knees so they are both pointed toward the ceiling and your feet are flat on the  floor.  Tense  your lower abdominal muscles to press your lower back into the floor. This motion will rotate your pelvis so that your tail bone is scooping upwards rather than pointing at your feet or into the floor.  With a gentle tension and even breathing, hold this position for __________ seconds. Repeat __________ times. Complete this exercise __________ times per day.  STRENGTHENING - Abdominals, Crunches   Lie on a firm bed or floor. Keeping your legs in front of you, bend your knees so they are both pointed toward the ceiling and your feet are flat on the floor. Cross your arms over your chest.  Slightly tip your chin down without bending your neck.  Tense your abdominals and slowly lift your trunk high enough to just clear your shoulder blades. Lifting higher can put excessive stress on the lower back and does not further strengthen your abdominal muscles.  Control your return to the starting position. Repeat __________ times. Complete this exercise __________ times per day.  STRENGTHENING - Quadruped, Opposite UE/LE Lift   Assume a hands and knees position on a firm surface. Keep your hands under your shoulders and your knees under your hips. You may place padding under your knees for comfort.  Find your neutral spine and gently tense your abdominal muscles so that you can maintain this position. Your shoulders and hips should form a rectangle that is parallel with the floor and is not twisted.  Keeping your trunk steady, lift your right hand no higher than your shoulder and then your left leg no higher than your hip. Make sure you are not holding your breath. Hold this position __________ seconds.  Continuing to keep your abdominal muscles tense and your back steady, slowly return to your starting position. Repeat with the opposite arm and leg. Repeat __________ times. Complete this exercise __________ times per day.  STRENGTHENING - Lower Abdominals, Double Knee Lift  Lie on a firm bed or  floor. Keeping your legs in front of you, bend your knees so they are both pointed toward the ceiling and your feet are flat on the floor.  Tense your abdominal muscles to brace your lower back and slowly lift both of your knees until they come over your hips. Be certain not to hold your breath.  Hold __________ seconds. Using your abdominal muscles, return to the starting position in a slow and controlled manner. Repeat __________ times. Complete this exercise __________ times per day.  POSTURE AND BODY MECHANICS CONSIDERATIONS - Low Back Strain Keeping correct posture when sitting, standing or completing your activities will reduce the stress put on different body tissues, allowing injured tissues a chance to heal and limiting painful experiences. The following are general guidelines for improved posture. Your physician or physical therapist will provide you with any instructions specific to your needs. While reading these guidelines, remember:  The exercises prescribed by your provider will help you have the flexibility and strength to maintain correct postures.  The correct posture provides the best environment for your joints to work. All of your joints have less wear and tear when properly supported by a spine with good posture. This means you will experience a healthier, less painful body.  Correct posture must be practiced with all of your activities, especially prolonged sitting and standing. Correct posture is as important when doing repetitive low-stress activities (typing) as it is when doing a single heavy-load activity (lifting). RESTING POSITIONS Consider which positions are most painful for you when choosing  a resting position. If you have pain with flexion-based activities (sitting, bending, stooping, squatting), choose a position that allows you to rest in a less flexed posture. You would want to avoid curling into a fetal position on your side. If your pain worsens with  extension-based activities (prolonged standing, working overhead), avoid resting in an extended position such as sleeping on your stomach. Most people will find more comfort when they rest with their spine in a more neutral position, neither too rounded nor too arched. Lying on a non-sagging bed on your side with a pillow between your knees, or on your back with a pillow under your knees will often provide some relief. Keep in mind, being in any one position for a prolonged period of time, no matter how correct your posture, can still lead to stiffness. PROPER SITTING POSTURE In order to minimize stress and discomfort on your spine, you must sit with correct posture. Sitting with good posture should be effortless for a healthy body. Returning to good posture is a gradual process. Many people can work toward this most comfortably by using various supports until they have the flexibility and strength to maintain this posture on their own. When sitting with proper posture, your ears will fall over your shoulders and your shoulders will fall over your hips. You should use the back of the chair to support your upper back. Your lower back will be in a neutral position, just slightly arched. You may place a small pillow or folded towel at the base of your lower back for support.  When working at a desk, create an environment that supports good, upright posture. Without extra support, muscles tire, which leads to excessive strain on joints and other tissues. Keep these recommendations in mind: CHAIR:  A chair should be able to slide under your desk when your back makes contact with the back of the chair. This allows you to work closely.  The chair's height should allow your eyes to be level with the upper part of your monitor and your hands to be slightly lower than your elbows. BODY POSITION  Your feet should make contact with the floor. If this is not possible, use a foot rest.  Keep your ears over your  shoulders. This will reduce stress on your neck and lower back. INCORRECT SITTING POSTURES  If you are feeling tired and unable to assume a healthy sitting posture, do not slouch or slump. This puts excessive strain on your back tissues, causing more damage and pain. Healthier options include:  Using more support, like a lumbar pillow.  Switching tasks to something that requires you to be upright or walking.  Talking a brief walk.  Lying down to rest in a neutral-spine position. PROLONGED STANDING WHILE SLIGHTLY LEANING FORWARD  When completing a task that requires you to lean forward while standing in one place for a long time, place either foot up on a stationary 2-4 inch high object to help maintain the best posture. When both feet are on the ground, the lower back tends to lose its slight inward curve. If this curve flattens (or becomes too large), then the back and your other joints will experience too much stress, tire more quickly, and can cause pain. CORRECT STANDING POSTURES Proper standing posture should be assumed with all daily activities, even if they only take a few moments, like when brushing your teeth. As in sitting, your ears should fall over your shoulders and your shoulders should fall over your  hips. You should keep a slight tension in your abdominal muscles to brace your spine. Your tailbone should point down to the ground, not behind your body, resulting in an over-extended swayback posture.  INCORRECT STANDING POSTURES  Common incorrect standing postures include a forward head, locked knees and/or an excessive swayback. WALKING Walk with an upright posture. Your ears, shoulders and hips should all line-up. PROLONGED ACTIVITY IN A FLEXED POSITION When completing a task that requires you to bend forward at your waist or lean over a low surface, try to find a way to stabilize 3 out of 4 of your limbs. You can place a hand or elbow on your thigh or rest a knee on the surface  you are reaching across. This will provide you more stability so that your muscles do not fatigue as quickly. By keeping your knees relaxed, or slightly bent, you will also reduce stress across your lower back. CORRECT LIFTING TECHNIQUES DO :   Assume a wide stance. This will provide you more stability and the opportunity to get as close as possible to the object which you are lifting.  Tense your abdominals to brace your spine. Bend at the knees and hips. Keeping your back locked in a neutral-spine position, lift using your leg muscles. Lift with your legs, keeping your back straight.  Test the weight of unknown objects before attempting to lift them.  Try to keep your elbows locked down at your sides in order get the best strength from your shoulders when carrying an object.  Always ask for help when lifting heavy or awkward objects. INCORRECT LIFTING TECHNIQUES DO NOT:   Lock your knees when lifting, even if it is a small object.  Bend and twist. Pivot at your feet or move your feet when needing to change directions.  Assume that you can safely pick up even a paper clip without proper posture.   This information is not intended to replace advice given to you by your health care provider. Make sure you discuss any questions you have with your health care provider.   Document Released: 01/30/2005 Document Revised: 02/20/2014 Document Reviewed: 05/14/2008 Elsevier Interactive Patient Education [employer].

## 2015-05-12 DIAGNOSIS — J449 Chronic obstructive pulmonary disease, unspecified: Secondary | ICD-10-CM

## 2015-05-12 DIAGNOSIS — E119 Type 2 diabetes mellitus without complications: Secondary | ICD-10-CM

## 2015-05-12 DIAGNOSIS — R569 Unspecified convulsions: Secondary | ICD-10-CM

## 2015-05-18 ENCOUNTER — Ambulatory Visit: Payer: Self-pay

## 2015-09-14 ENCOUNTER — Ambulatory Visit: Payer: Self-pay | Admitting: Urology

## 2015-09-14 VITALS — BP 110/73 | HR 80 | Ht 66.0 in | Wt 179.0 lb

## 2015-09-14 DIAGNOSIS — G8929 Other chronic pain: Secondary | ICD-10-CM

## 2015-09-14 DIAGNOSIS — M25531 Pain in right wrist: Principal | ICD-10-CM

## 2015-09-14 MED ORDER — NAPROXEN 500 MG PO TABS: 500.0000 mg | ORAL_TABLET | Freq: Two times a day (BID) | ORAL | 0 refills | Status: DC

## 2015-09-14 NOTE — Progress Notes (Signed)
  Patient: Erica Gomez Female    DOB: 1965-09-26   50 y.o.   MRN: [id number] Visit Date: 09/14/2015  Today's Provider: [place]   Chief Complaint  Patient presents with  . Wrist Pain    Finger numbness  . Back Pain    Sore area has expanded over 3 mo   Subjective:    HPI Patient states both hands have been numbness and pain for a number of years, but over the last 3 months the left hand pain and numbness has increased.  Patient is also having pain in her mid back for the last month.  Rubbing the back, makes it worse.  Naproxen helped.        No Known Allergies Previous Medications   ALBUTEROL (PROVENTIL HFA;VENTOLIN HFA) 108 (90 BASE) MCG/ACT INHALER    Inhale 2 puffs into the lungs every 6 (six) hours as needed for wheezing.   CITALOPRAM (CELEXA) 20 MG TABLET    Take 20 mg by mouth daily.   FLUTICASONE-SALMETEROL (ADVAIR) 500-50 MCG/DOSE AEPB    Inhale 1 puff into the lungs every 12 (twelve) hours.   NAPROXEN (NAPROSYN) 500 MG TABLET    Take 1 tablet (500 mg total) by mouth 2 (two) times daily with a meal.   OMEGA-3 FATTY ACIDS (FISH OIL PO)    Take 1 tablet by mouth daily.   PRENATAL VIT-FE FUMARATE-FA (PRENATAL PO)    Take 1 tablet by mouth daily.   RISPERIDONE (RISPERDAL) 2 MG TABLET    Take 2 mg by mouth daily.    Review of Systems  Social History  Substance Use Topics  . Smoking status: Current Every Day Smoker    Packs/day: 0.50    Types: Cigarettes  . Smokeless tobacco: Never Used  . Alcohol use No   Objective:   BP 110/73   Pulse 80   Ht 5\' 6"  (1.676 m)   Wt 179 lb (81.2 kg)   SpO2 97%   BMI 28.89 kg/m   Physical Exam Constitutional: Well nourished. Alert and oriented, No acute distress. HEENT: [place] AT, moist mucus membranes. Trachea midline, no masses. Cardiovascular: No clubbing, cyanosis, or edema. Respiratory: Normal respiratory effort, no increased work of breathing. Skin: No rashes, bruises or suspicious lesions. Lymph: No cervical  or inguinal adenopathy. Neurologic: Grossly intact, no focal deficits, moving all 4 extremities. Psychiatric: Normal mood and affect. Patient with tenderness along the right anterior forearm and in right Paraspinous muscle in the thoracic spine      Assessment & Plan:     1. Wrist pain  -appointment with Dr. Vickki Hearing  -script for naproxen      [facility]

## 2015-09-22 ENCOUNTER — Encounter: Payer: Self-pay | Admitting: Specialist

## 2015-10-05 ENCOUNTER — Ambulatory Visit: Payer: Self-pay | Admitting: Urology

## 2015-10-05 VITALS — BP 114/65 | HR 97 | Wt 184.0 lb

## 2015-10-05 DIAGNOSIS — R229 Localized swelling, mass and lump, unspecified: Secondary | ICD-10-CM

## 2015-10-05 NOTE — Progress Notes (Signed)
  Patient: Erica Gomez Female    DOB: 07/26/1965   50 y.o.   MRN: [id number] Visit Date: 10/05/2015  Today's Provider: [place]   Chief Complaint  Patient presents with  . Mass    bump in between shoulder blades, painful, has grown in size, has had for 5-6mo  . Medication Refill    Proventil    Subjective:    HPI    Patient states both hands have been numbness and pain for a number of years, but over the last 3 months the left hand pain and numbness has increased.  Patient is also having pain in her mid back for the last month.  Rubbing the back, makes it worse.  Naproxen helped.  Seen Dr. Vickki Hearing- has carpal tunnel  Bump is painful to palpation and has grown from a dime to a quarter- coughs ferociously- new onset cough-coughs up a yellowish brown mucus      Allergies  Allergen Reactions  . Flax Seed Oil [Bio-Flax] Hives    Pt. Self reported   Previous Medications   ALBUTEROL (PROVENTIL HFA;VENTOLIN HFA) 108 (90 BASE) MCG/ACT INHALER    Inhale 2 puffs into the lungs every 6 (six) hours as needed for wheezing.   CITALOPRAM (CELEXA) 20 MG TABLET    Take 20 mg by mouth daily.   FLUTICASONE-SALMETEROL (ADVAIR) 500-50 MCG/DOSE AEPB    Inhale 1 puff into the lungs every 12 (twelve) hours.   NAPROXEN (NAPROSYN) 500 MG TABLET    Take 1 tablet (500 mg total) by mouth 2 (two) times daily with a meal.   OMEGA-3 FATTY ACIDS (FISH OIL PO)    Take 1 tablet by mouth daily.   PRENATAL VIT-FE FUMARATE-FA (PRENATAL PO)    Take 1 tablet by mouth daily.   RISPERIDONE (RISPERDAL) 2 MG TABLET    Take 2 mg by mouth daily.    Review of Systems  Social History  Substance Use Topics  . Smoking status: Current Every Day Smoker    Packs/day: 0.50    Types: Cigarettes  . Smokeless tobacco: Current User    Types: Chew     Comment: Using chew to wean herself off cigarettes  . Alcohol use Yes     Comment: history   Objective:   BP 114/65   Pulse 97   Wt 184 lb (83.5 kg)   SpO2  96%   BMI 29.70 kg/m   Physical Exam Constitutional: Well nourished. Alert and oriented, No acute distress. HEENT: [place] AT, moist mucus membranes. Trachea midline, no masses. Cardiovascular: No clubbing, cyanosis, or edema. Respiratory: Normal respiratory effort, no increased work of breathing. Skin: No rashes, bruises or suspicious lesions. Lymph: No cervical or inguinal adenopathy. Neurologic: Grossly intact, no focal deficits, moving all 4 extremities. Psychiatric: Normal mood and affect. Pain in the right upper back, cannot appreciate lump the patient is describing      Assessment & Plan:     1. Lump in back  - cannot appreciate it on exam  - fearful of an ominous process  - patient is a smoker  - new cough  - schedule CT chest        [facility] [place]

## 2016-01-14 ENCOUNTER — Telehealth: Payer: Self-pay

## 2016-01-14 NOTE — Telephone Encounter (Signed)
Called pt to let her know I scheduled her CT for 12/6 at 4:30 she will go to the [facility] Imaging center. No answer.

## 2016-01-14 NOTE — Telephone Encounter (Addendum)
Tried calling pt again and man answered stating her the number is no longer hers. Deleted from system. Letter sent to pt informing her of CT appointment on 12/6.

## 2016-01-19 ENCOUNTER — Ambulatory Visit: Admission: RE | Admit: 2016-01-19 | Payer: Self-pay | Source: Ambulatory Visit

## 2016-02-22 ENCOUNTER — Emergency Department
Admission: EM | Admit: 2016-02-22 | Discharge: 2016-02-22 | Disposition: A | Discharge status: Home / Self Care | Payer: Self-pay | Attending: Emergency Medicine | Admitting: Emergency Medicine

## 2016-02-22 ENCOUNTER — Encounter: Payer: Self-pay | Admitting: *Deleted

## 2016-02-22 DIAGNOSIS — F17210 Nicotine dependence, cigarettes, uncomplicated: Secondary | ICD-10-CM | POA: Insufficient documentation

## 2016-02-22 DIAGNOSIS — F17220 Nicotine dependence, chewing tobacco, uncomplicated: Secondary | ICD-10-CM | POA: Insufficient documentation

## 2016-02-22 DIAGNOSIS — Z79899 Other long term (current) drug therapy: Secondary | ICD-10-CM | POA: Insufficient documentation

## 2016-02-22 DIAGNOSIS — J45909 Unspecified asthma, uncomplicated: Secondary | ICD-10-CM | POA: Insufficient documentation

## 2016-02-22 DIAGNOSIS — L0201 Cutaneous abscess of face: Secondary | ICD-10-CM | POA: Insufficient documentation

## 2016-02-22 DIAGNOSIS — J449 Chronic obstructive pulmonary disease, unspecified: Secondary | ICD-10-CM | POA: Insufficient documentation

## 2016-02-22 MED ORDER — OXYCODONE-ACETAMINOPHEN 5-325 MG PO TABS
1.0000 | ORAL_TABLET | Freq: Once | ORAL | Status: AC
  Administered 2016-02-22: 1 via ORAL
  Filled 2016-02-22: qty 1

## 2016-02-22 MED ORDER — SULFAMETHOXAZOLE-TRIMETHOPRIM 800-160 MG PO TABS: 1.0000 | ORAL_TABLET | Freq: Two times a day (BID) | ORAL | 0 refills | Status: DC

## 2016-02-22 MED ORDER — LIDOCAINE-EPINEPHRINE (PF) 2 %-1:200000 IJ SOLN: 10.0000 mL | Freq: Once | INTRAMUSCULAR | Status: DC

## 2016-02-22 MED ORDER — SULFAMETHOXAZOLE-TRIMETHOPRIM 800-160 MG PO TABS
1.0000 | ORAL_TABLET | Freq: Once | ORAL | Status: AC
  Administered 2016-02-22: 1 via ORAL
  Filled 2016-02-22: qty 1

## 2016-02-22 MED ORDER — HYDROCODONE-ACETAMINOPHEN 5-325 MG PO TABS: 1.0000 | ORAL_TABLET | ORAL | 0 refills | Status: DC | PRN

## 2016-02-22 NOTE — ED Notes (Signed)

## 2016-02-22 NOTE — ED Provider Notes (Signed)
[facility] Emergency Department Provider Note  ____________________________________________  Time seen: Approximately 10:05 PM  I have reviewed the triage vital signs and the nursing notes.   HISTORY  Chief Complaint Abscess    HPI Erica Gomez is a 51 y.o. female who presents to emergency department complaining of an abscess to the forehead. Patient states over the last 5 days she has had a "pimple" grown becoming increasingly red and tender. Patient states that she has no history of reoccurring skin lesions. She denies any wound prior to formation of "pimple." Patient denies any fevers or chills, headache, visual changes, abdominal pain, nausea or vomiting. Patient does report some erythema and edema surrounding the skin lesion to the forehead. No other complaint at this time.   Past Medical History:  Diagnosis Date  . Asthma   . Bipolar affective, manic ([place]) 1996  . COPD (chronic obstructive pulmonary disease) ([place])   . Manic depressive illness ([place]) 1996  . Seizures [facility])     Patient Active Problem List   Diagnosis Date Noted  . Diabetes ([place]) 04/03/2014  . COPD (chronic obstructive pulmonary disease) ([place]) 12/17/2012  . Seizure ([place]) 12/17/2012    Past Surgical History:  Procedure Laterality Date  . ABDOMINAL HYSTERECTOMY      Prior to Admission medications   Medication Sig Start Date End Date Taking? Authorizing Provider  albuterol (PROVENTIL HFA;VENTOLIN HFA) 108 (90 BASE) MCG/ACT inhaler Inhale 2 puffs into the lungs every 6 (six) hours as needed for wheezing.    Historical Provider, MD  citalopram (CELEXA) 20 MG tablet Take 20 mg by mouth daily.    Historical Provider, MD  Fluticasone-Salmeterol (ADVAIR) 500-50 MCG/DOSE AEPB Inhale 1 puff into the lungs every 12 (twelve) hours.    Historical Provider, MD  naproxen (NAPROSYN) 500 MG tablet Take 1 tablet (500 mg total) by mouth 2 (two) times daily with a meal. 09/14/15   Nori Riis, PA-C  Omega-3 Fatty Acids (FISH OIL PO) Take 1 tablet by mouth daily.    Historical Provider, MD  Prenatal Vit-Fe Fumarate-FA (PRENATAL PO) Take 1 tablet by mouth daily.    Historical Provider, MD  risperiDONE (RISPERDAL) 2 MG tablet Take 2 mg by mouth daily.    Historical Provider, MD    Allergies Flax seed oil [bio-flax]  Family History  Problem Relation Age of Onset  . Heart attack Father     Social History Social History  Substance Use Topics  . Smoking status: Current Every Day Smoker    Packs/day: 0.50    Types: Cigarettes  . Smokeless tobacco: Current User    Types: Chew     Comment: Using chew to wean herself off cigarettes  . Alcohol use No     Comment: history     Review of Systems  Constitutional: No fever/chills Eyes: No visual changes.  ENT: No upper respiratory complaints. Cardiovascular: no chest pain. Respiratory: no cough. No SOB. Gastrointestinal: No abdominal pain.  No nausea, no vomiting.  No diarrhea.  No constipation. Musculoskeletal: Negative for musculoskeletal pain. Skin: Positive for enlarged "pimple" to the forehead. Neurological: Negative for headaches, focal weakness or numbness. 10-point ROS otherwise negative.  ____________________________________________   PHYSICAL EXAM:  VITAL SIGNS: ED Triage Vitals  Enc Vitals Group     BP 02/22/16 2006 (!) 153/78     Pulse Rate 02/22/16 2006 (!) 105     Resp 02/22/16 2006 20     Temp 02/22/16 2006 100 F (37.8 C)  Temp Source 02/22/16 2006 Oral     SpO2 02/22/16 2006 95 %     Weight 02/22/16 2007 186 lb (84.4 kg)     Height 02/22/16 2007 5\' 6"  (1.676 m)     Head Circumference --      Peak Flow --      Pain Score 02/22/16 2007 7     Pain Loc --      Pain Edu? --      Excl. in [place]? --      Constitutional: Alert and oriented. Well appearing and in no acute distress. Eyes: Conjunctivae are normal. PERRL. EOMI. Head: Atraumatic.See below note for skin description. ENT:       Ears:       Nose: No congestion/rhinnorhea.      Mouth/Throat: Mucous membranes are moist.  Neck: No stridor.   Hematological/Lymphatic/Immunilogical: No cervical lymphadenopathy. Cardiovascular: Normal rate, regular rhythm. Normal S1 and S2.  Good peripheral circulation. Respiratory: Normal respiratory effort without tachypnea or retractions. Lungs CTAB. Good air entry to the bases with no decreased or absent breath sounds. Musculoskeletal: Full range of motion to all extremities. No gross deformities appreciated. Neurologic:  Normal speech and language. No gross focal neurologic deficits are appreciated.  Skin:  Skin is warm, dry and intact. No rash noted. Erythematous and edematous skin lesion noted to the right side of the forehead. This is just lateral to midline. Area is erythematous, edematous, indurated, fluctuant. No drainage. Mild surrounding erythema and edema consistent with cellulitis. Psychiatric: Mood and affect are normal. Speech and behavior are normal. Patient exhibits appropriate insight and judgement.   ____________________________________________   LABS (all labs ordered are listed, but only abnormal results are displayed)  Labs Reviewed - No data to display ____________________________________________  EKG   ____________________________________________  RADIOLOGY   No results found.  ____________________________________________    PROCEDURES  Procedure(s) performed:    Marland KitchenMarland KitchenIncision and Drainage Date/Time: 02/23/2016 12:04 AM Performed by: Betha Loa D Authorized by: Betha Loa D   Consent:    Consent obtained:  Verbal   Consent given by:  Patient   Risks discussed:  Bleeding, incomplete drainage and pain Location:    Type:  Abscess   Size:  2 cm   Location:  Head   Head location:  Scalp Pre-procedure details:    Skin preparation:  Betadine Anesthesia (see MAR for exact dosages):    Anesthesia method:  Local infiltration    Local anesthetic:  Lidocaine 1% w/o epi Procedure type:    Complexity:  Simple Procedure details:    Needle aspiration: no     Incision types:  Single straight   Incision depth:  Subcutaneous   Scalpel blade:  11   Wound management:  Probed and deloculated and irrigated with saline   Drainage:  Purulent and bloody   Drainage amount:  Moderate   Wound treatment:  Wound left open   Packing materials:  None Post-procedure details:    Patient tolerance of procedure:  Tolerated well, no immediate complications      Medications - No data to display   ____________________________________________   INITIAL IMPRESSION / ASSESSMENT AND PLAN / ED COURSE  Pertinent labs & imaging results that were available during my care of the patient were reviewed by me and considered in my medical decision making (see chart for details).  Review of the [place] CSRS was performed in accordance of the [place] prior to dispensing any controlled drugs.  Clinical Course     Patient's  diagnosis is consistent with Abscess to the forehead. Discussed with patient incision and drainage in the emergency department versus placing patient on antibiotics and referral to ENT surgeon to minimize any scarring. Patient verbalizes at this time to proceed with incision and drainage in the emergency department. This is completed as described above. Very minimal incision was necessary to obtain drainage. Moderate drainage for a facial abscess but wound is not deep and does not require packing. Patient is given first antibiotic in the emergency department.. Patient will be discharged home with prescriptions for antibiotics and very limited pain medication. Patient is to follow up with primary care as needed or otherwise directed. Patient is given ED precautions to return to the ED for any worsening or new symptoms.     ____________________________________________  FINAL CLINICAL IMPRESSION(S) / ED DIAGNOSES  Final diagnoses:   None      NEW MEDICATIONS STARTED DURING THIS VISIT:  New Prescriptions   No medications on file        This chart was dictated using voice recognition software/Dragon. Despite best efforts to proofread, errors can occur which can change the meaning. Any change was purely unintentional.    Darletta Moll, PA-C 02/23/16 0007    Eula Listen, MD 02/29/16 [phone]

## 2016-02-22 NOTE — ED Triage Notes (Signed)
Pt ambulatory to triage.  Pt has swollen red area to right forehead (abscess).  No drainage.  Area tender to touch.

## 2016-04-04 ENCOUNTER — Telehealth: Payer: Self-pay

## 2016-04-04 NOTE — Telephone Encounter (Signed)
Received PAP application from [facility] for Advair placed for provider to sign.

## 2016-04-14 ENCOUNTER — Telehealth: Payer: Self-pay | Admitting: Pharmacist

## 2016-04-14 NOTE — Telephone Encounter (Signed)
Faxed script to [place] for refill on Advair 500/50.

## 2016-04-27 ENCOUNTER — Ambulatory Visit: Payer: Self-pay | Admitting: Ophthalmology

## 2016-05-11 ENCOUNTER — Ambulatory Visit: Payer: Self-pay | Admitting: Nurse Practitioner

## 2016-05-11 VITALS — BP 134/81 | HR 90 | Temp 98.2°F | Wt 177.3 lb

## 2016-05-11 DIAGNOSIS — F32A Depression, unspecified: Secondary | ICD-10-CM

## 2016-05-11 DIAGNOSIS — F329 Major depressive disorder, single episode, unspecified: Secondary | ICD-10-CM

## 2016-05-11 DIAGNOSIS — I10 Essential (primary) hypertension: Secondary | ICD-10-CM

## 2016-05-11 DIAGNOSIS — E782 Mixed hyperlipidemia: Secondary | ICD-10-CM

## 2016-05-11 DIAGNOSIS — E010 Iodine-deficiency related diffuse (endemic) goiter: Secondary | ICD-10-CM

## 2016-05-11 DIAGNOSIS — R231 Pallor: Secondary | ICD-10-CM

## 2016-05-11 DIAGNOSIS — R7309 Other abnormal glucose: Secondary | ICD-10-CM

## 2016-05-11 DIAGNOSIS — R5383 Other fatigue: Principal | ICD-10-CM

## 2016-05-11 DIAGNOSIS — H47292 Other optic atrophy, left eye: Secondary | ICD-10-CM

## 2016-05-11 NOTE — Progress Notes (Unsigned)
HAS NOT BEEN  TO CLINIC FOR 2-3 YEARS, THOUGH RECENT SEEN AT ER, CYST OR ABSCESS ON r FOREHEAD, NEEDED iI&D.  TOOK ANTIBIOTICS  CONTINUES TO SMOKE 10 CIGARETTES PER DAY  ETOH NONE, NO RECREATIONAL SUBSTANCES OR NARCOTICS   PSYCHIATRIC MEDS PRESCRIBED BY [facility]  INTERMITTENTLY BINGES ON SODA, MAY DRINK A CASE WITHIN 3 DAYS. STATES THE PEPSI HAS CAUSED WEIGHT GAIN.    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 SUBMANDIBULAR ADENOPATHY  AP RRR BBRS CLEAR BUT DIMINISHED, AS ANTICIPATED IN A SMOKER.    NO MASS PALPALPATED ON l UPPER BACK.    ? OF THYROMEGALY PARTICULARLY ON R SIDE  IMPRESSION/PLAN: WILL ORDER APPROPRIATE HEALTH ASSESSMENT LABS AND HAVE PT RETURN FOR REVIEW.    WILL ORDER AN US OF THE THYROID GLAND,

## 2016-05-12 LAB — CBC WITH DIFFERENTIAL/PLATELET
Basophils Absolute: 0 10*3/uL (ref 0.0–0.2)
Basos: 0 %
EOS (ABSOLUTE): 0.3 10*3/uL (ref 0.0–0.4)
Eos: 3 %
Hematocrit: 36.7 % (ref 34.0–46.6)
Hemoglobin: 11.9 g/dL (ref 11.1–15.9)
Immature Grans (Abs): 0 10*3/uL (ref 0.0–0.1)
Immature Granulocytes: 0 %
Lymphocytes Absolute: 3.1 10*3/uL (ref 0.7–3.1)
Lymphs: 36 %
MCH: 28.7 pg (ref 26.6–33.0)
MCHC: 32.4 g/dL (ref 31.5–35.7)
MCV: 88 fL (ref 79–97)
Monocytes Absolute: 0.5 10*3/uL (ref 0.1–0.9)
Monocytes: 6 %
Neutrophils Absolute: 4.7 10*3/uL (ref 1.4–7.0)
Neutrophils: 55 %
Platelets: 302 10*3/uL (ref 150–379)
RBC: 4.15 x10E6/uL (ref 3.77–5.28)
RDW: 14 % (ref 12.3–15.4)
WBC: 8.6 10*3/uL (ref 3.4–10.8)

## 2016-05-12 LAB — COMPREHENSIVE METABOLIC PANEL
ALT: 16 IU/L (ref 0–32)
AST: 13 IU/L (ref 0–40)
Albumin/Globulin Ratio: 1.6 (ref 1.2–2.2)
Albumin: 4.2 g/dL (ref 3.5–5.5)
Alkaline Phosphatase: 78 IU/L (ref 39–117)
BUN/Creatinine Ratio: 15 (ref 9–23)
BUN: 17 mg/dL (ref 6–24)
Bilirubin Total: <0.2 mg/dL (ref 0.0–1.2)
CO2: 26 mmol/L (ref 18–29)
Calcium: 9.6 mg/dL (ref 8.7–10.2)
Chloride: 102 mmol/L (ref 96–106)
Creatinine, Ser: 1.12 mg/dL — ABNORMAL HIGH (ref 0.57–1.00)
GFR calc Af Amer: 66 mL/min/{1.73_m2} (ref >59)
GFR calc non Af Amer: 57 mL/min/{1.73_m2} — ABNORMAL LOW (ref >59)
Globulin, Total: 2.6 g/dL (ref 1.5–4.5)
Glucose: 103 mg/dL — ABNORMAL HIGH (ref 65–99)
Potassium: 4 mmol/L (ref 3.5–5.2)
Sodium: 143 mmol/L (ref 134–144)
Total Protein: 6.8 g/dL (ref 6.0–8.5)

## 2016-05-12 LAB — LIPID PANEL
Chol/HDL Ratio: 4 ratio units (ref 0.0–4.4)
Cholesterol, Total: 170 mg/dL (ref 100–199)
HDL: 43 mg/dL (ref >39)
LDL Calculated: 87 mg/dL (ref 0–99)
Triglycerides: 199 mg/dL — ABNORMAL HIGH (ref 0–149)
VLDL Cholesterol Cal: 40 mg/dL (ref 5–40)

## 2016-05-12 LAB — HEMOGLOBIN A1C
Est. average glucose Bld gHb Est-mCnc: 137 mg/dL
Hgb A1c MFr Bld: 6.4 % — ABNORMAL HIGH (ref 4.8–5.6)

## 2016-05-12 LAB — THYROID PANEL WITH TSH
Free Thyroxine Index: 1.6 (ref 1.2–4.9)
T3 Uptake Ratio: 28 % (ref 24–39)
T4, Total: 5.6 ug/dL (ref 4.5–12.0)
TSH: 1.03 u[IU]/mL (ref 0.450–4.500)

## 2016-05-23 ENCOUNTER — Ambulatory Visit: Payer: Self-pay | Admitting: Adult Health Nurse Practitioner

## 2016-05-23 VITALS — BP 116/71 | HR 97 | Temp 97.9°F | Ht 66.0 in | Wt 182.0 lb

## 2016-05-23 DIAGNOSIS — E119 Type 2 diabetes mellitus without complications: Secondary | ICD-10-CM

## 2016-05-23 NOTE — Progress Notes (Signed)
  Patient: Erica Gomez Female    DOB: [birth year]   51 y.o.   MRN: [id number] Visit Date: 05/23/2016  Today's Provider: Staci Acosta, NP   Chief Complaint  Patient presents with  . Follow-up   Subjective:    HPI   Here today for review of labs.  A1C 6.4 improved from 6.8 with diet control.    Denies any current concerns.    Allergies  Allergen Reactions  . Flax Seed Oil [Bio-Flax] Hives    Pt. Self reported   Previous Medications   ALBUTEROL (PROVENTIL HFA;VENTOLIN HFA) 108 (90 BASE) MCG/ACT INHALER    Inhale 2 puffs into the lungs every 6 (six) hours as needed for wheezing.   CITALOPRAM (CELEXA) 20 MG TABLET    Take 20 mg by mouth daily.   FLUTICASONE-SALMETEROL (ADVAIR) 500-50 MCG/DOSE AEPB    Inhale 1 puff into the lungs every 12 (twelve) hours.   HYDROCODONE-ACETAMINOPHEN (NORCO/VICODIN) 5-325 MG TABLET    Take 1 tablet by mouth every 4 (four) hours as needed for moderate pain.   NAPROXEN (NAPROSYN) 500 MG TABLET    Take 1 tablet (500 mg total) by mouth 2 (two) times daily with a meal.   OMEGA-3 FATTY ACIDS (FISH OIL PO)    Take 1 tablet by mouth daily.   PRENATAL VIT-FE FUMARATE-FA (PRENATAL PO)    Take 1 tablet by mouth daily.   RISPERIDONE (RISPERDAL) 2 MG TABLET    Take 2 mg by mouth daily.   SULFAMETHOXAZOLE-TRIMETHOPRIM (BACTRIM DS,SEPTRA DS) 800-160 MG TABLET    Take 1 tablet by mouth 2 (two) times daily.    Review of Systems  All other systems reviewed and are negative.   Social History  Substance Use Topics  . Smoking status: Current Every Day Smoker    Packs/day: 0.50    Years: 36.00    Types: Cigarettes  . Smokeless tobacco: Current User    Types: Chew     Comment: Using chew to wean herself off cigarettes  . Alcohol use No     Comment: history   Objective:   BP 116/71   Pulse 97   Temp 97.9 F (36.6 C)   Ht 5\' 6"  (1.676 m)   Wt 182 lb (82.6 kg)   BMI 29.38 kg/m   Physical Exam  Constitutional: She is oriented to person, place, and  time. She appears well-developed and well-nourished.  HENT:  Head: Normocephalic and atraumatic.  Cardiovascular: Normal rate, regular rhythm and normal heart sounds.   Pulmonary/Chest: Effort normal and breath sounds normal.  Abdominal: Soft. Bowel sounds are normal.  Neurological: She is alert and oriented to person, place, and time.  Skin: Skin is warm and dry.  Vitals reviewed.       Assessment & Plan:       DM:  Discussed diabetes diet modifications and healthy lifestyle changes.  Encouraged increase water intake.   Other labs stable.       Staci Acosta, NP   [facility] [place]

## 2016-05-23 NOTE — Patient Instructions (Signed)
Diabetes Mellitus and Food It is important for you to manage your blood sugar (glucose) level. Your blood glucose level can be greatly affected by what you eat. Eating healthier foods in the appropriate amounts throughout the day at about the same time each day will help you control your blood glucose level. It can also help slow or prevent worsening of your diabetes mellitus. Healthy eating may even help you improve the level of your blood pressure and reach or maintain a healthy weight. General recommendations for healthful eating and cooking habits include:  Eating meals and snacks regularly. Avoid going long periods of time without eating to lose weight.  Eating a diet that consists mainly of plant-based foods, such as fruits, vegetables, nuts, legumes, and whole grains.  Using low-heat cooking methods, such as baking, instead of high-heat cooking methods, such as deep frying.  Work with your dietitian to make sure you understand how to use the Nutrition Facts information on food labels. How can food affect me? Carbohydrates Carbohydrates affect your blood glucose level more than any other type of food. Your dietitian will help you determine how many carbohydrates to eat at each meal and teach you how to count carbohydrates. Counting carbohydrates is important to keep your blood glucose at a healthy level, especially if you are using insulin or taking certain medicines for diabetes mellitus. Alcohol Alcohol can cause sudden decreases in blood glucose (hypoglycemia), especially if you use insulin or take certain medicines for diabetes mellitus. Hypoglycemia can be a life-threatening condition. Symptoms of hypoglycemia (sleepiness, dizziness, and disorientation) are similar to symptoms of having too much alcohol. If your health care provider has given you approval to drink alcohol, do so in moderation and use the following guidelines:  Women should not have more than one drink per day, and men  should not have more than two drinks per day. One drink is equal to: ? 12 oz of beer. ? 5 oz of wine. ? 1 oz of hard liquor.  Do not drink on an empty stomach.  Keep yourself hydrated. Have water, diet soda, or unsweetened iced tea.  Regular soda, juice, and other mixers might contain a lot of carbohydrates and should be counted.  What foods are not recommended? As you make food choices, it is important to remember that all foods are not the same. Some foods have fewer nutrients per serving than other foods, even though they might have the same number of calories or carbohydrates. It is difficult to get your body what it needs when you eat foods with fewer nutrients. Examples of foods that you should avoid that are high in calories and carbohydrates but low in nutrients include:  Trans fats (most processed foods list trans fats on the Nutrition Facts label).  Regular soda.  Juice.  Candy.  Sweets, such as cake, pie, doughnuts, and cookies.  Fried foods.  What foods can I eat? Eat nutrient-rich foods, which will nourish your body and keep you healthy. The food you should eat also will depend on several factors, including:  The calories you need.  The medicines you take.  Your weight.  Your blood glucose level.  Your blood pressure level.  Your cholesterol level.  You should eat a variety of foods, including:  Protein. ? Lean cuts of meat. ? Proteins low in saturated fats, such as fish, egg whites, and beans. Avoid processed meats.  Fruits and vegetables. ? Fruits and vegetables that may help control blood glucose levels, such as apples,   mangoes, and yams.  Dairy products. ? Choose fat-free or low-fat dairy products, such as milk, yogurt, and cheese.  Grains, bread, pasta, and rice. ? Choose whole grain products, such as multigrain bread, whole oats, and brown rice. These foods may help control blood pressure.  Fats. ? Foods containing healthful fats, such as  nuts, avocado, olive oil, canola oil, and fish.  Does everyone with diabetes mellitus have the same meal plan? Because every person with diabetes mellitus is different, there is not one meal plan that works for everyone. It is very important that you meet with a dietitian who will help you create a meal plan that is just right for you. This information is not intended to replace advice given to you by your health care provider. Make sure you discuss any questions you have with your health care provider. Document Released: 10/27/2004 Document Revised: 07/08/2015 Document Reviewed: 12/27/2012 Elsevier Interactive Patient Education  2017 Elsevier Inc.  

## 2016-06-01 ENCOUNTER — Telehealth: Payer: Self-pay | Admitting: Pharmacy Technician

## 2016-06-01 ENCOUNTER — Telehealth: Payer: Self-pay

## 2016-06-01 NOTE — Telephone Encounter (Signed)
[facility] application sent today.

## 2016-06-01 NOTE — Telephone Encounter (Signed)
Patient provided 2018 poi.  Approved to receive medication assistance at [facility], as long as eligibility continues to be met.  Ventolin & Advair Prescription Assistance Applications sent to [facility] for signature.  [facility]

## 2016-06-14 ENCOUNTER — Telehealth: Payer: Self-pay

## 2016-06-14 NOTE — Telephone Encounter (Signed)
Placed signed application/script in [facility] folder for pickup. 

## 2016-06-14 NOTE — Telephone Encounter (Signed)
Received PAP application from [facility] for Ventolin & Advair placed for provider to sign.

## 2016-06-15 ENCOUNTER — Telehealth: Payer: Self-pay | Admitting: Pharmacist

## 2016-06-15 NOTE — Telephone Encounter (Signed)
06/15/16 Placed refill online with [facility] for Advair 500/50-to ship 07/13/16.

## 2016-06-29 ENCOUNTER — Telehealth: Payer: Self-pay | Admitting: Pharmacist

## 2016-06-29 NOTE — Telephone Encounter (Signed)
06/29/16 Faxed Re Enrollment application to [facility] for Ventolin HFA Inhale 2 puffs every 4-6 hours as needed, and Advair 500/50 Inhale 1 puff twice a day, rinse mouth and spit after each use.

## 2016-07-20 ENCOUNTER — Telehealth: Payer: Self-pay

## 2016-07-20 NOTE — Telephone Encounter (Signed)
Received PAP application from [facility] for Ventolin HFA placed for provider to sign. 

## 2016-08-02 ENCOUNTER — Telehealth: Payer: Self-pay

## 2016-08-02 DIAGNOSIS — M545 Low back pain: Secondary | ICD-10-CM

## 2016-08-02 NOTE — Telephone Encounter (Signed)
Placed signed application/script in [facility] folder for pickup. 

## 2016-08-02 NOTE — Telephone Encounter (Signed)
[facility] said they could not see the CT order. So another one was resent.

## 2016-08-07 ENCOUNTER — Ambulatory Visit
Admission: RE | Admit: 2016-08-07 | Discharge: 2016-08-07 | Disposition: A | Discharge status: Home / Self Care | Payer: Self-pay | Source: Ambulatory Visit | Attending: Nurse Practitioner | Admitting: Nurse Practitioner

## 2016-08-07 DIAGNOSIS — M545 Low back pain: Secondary | ICD-10-CM

## 2016-08-08 ENCOUNTER — Other Ambulatory Visit: Payer: Self-pay | Admitting: Adult Health Nurse Practitioner

## 2016-08-08 ENCOUNTER — Telehealth: Payer: Self-pay | Admitting: Pharmacist

## 2016-08-08 DIAGNOSIS — M546 Pain in thoracic spine: Secondary | ICD-10-CM

## 2016-08-08 NOTE — Telephone Encounter (Signed)
08/08/16 Faxed script to [place] for refill on Ventolin HFA Inhale 2 puffs every 4-6 hours as needed.[name]

## 2016-08-08 NOTE — Telephone Encounter (Signed)
08/07/16 Requested refill online with [facility] for Advair 500/50 & Ventolin-to release 09/29/16, order# [id number].[name]

## 2016-08-11 ENCOUNTER — Ambulatory Visit
Admission: RE | Admit: 2016-08-11 | Discharge: 2016-08-11 | Disposition: A | Discharge status: Home / Self Care | Payer: Self-pay | Source: Ambulatory Visit | Attending: Nurse Practitioner | Admitting: Nurse Practitioner

## 2016-08-11 DIAGNOSIS — M546 Pain in thoracic spine: Secondary | ICD-10-CM | POA: Insufficient documentation

## 2016-08-11 DIAGNOSIS — R918 Other nonspecific abnormal finding of lung field: Secondary | ICD-10-CM | POA: Insufficient documentation

## 2016-09-07 ENCOUNTER — Ambulatory Visit: Payer: Self-pay | Admitting: Family Medicine

## 2016-09-07 VITALS — BP 119/67 | HR 98 | Wt 186.0 lb

## 2016-09-07 DIAGNOSIS — E119 Type 2 diabetes mellitus without complications: Secondary | ICD-10-CM

## 2016-09-07 MED ORDER — BACLOFEN 10 MG PO TABS: 10.0000 mg | ORAL_TABLET | Freq: Three times a day (TID) | ORAL | 0 refills | Status: DC

## 2016-09-07 MED ORDER — IBUPROFEN 800 MG PO TABS: 800.0000 mg | ORAL_TABLET | Freq: Three times a day (TID) | ORAL | 5 refills | Status: DC | PRN

## 2016-09-07 NOTE — Progress Notes (Signed)
   Subjective:    Patient ID: Erica Gomez, female    DOB: 10-17-1965, 51 y.o.   MRN: [id number]  HPI  FU for Chest CT results  Pt reports back pain    Patient Active Problem List   Diagnosis Date Noted  . Diabetes ([place]) 04/03/2014  . COPD (chronic obstructive pulmonary disease) ([place]) 12/17/2012  . Seizure ([place]) 12/17/2012   Allergies as of 09/07/2016      Reactions   Flax Seed Oil [bio-flax] Hives   Pt. Self reported      Medication List       Accurate as of 09/07/16  7:51 PM. Always use your most recent med list.          albuterol 108 (90 Base) MCG/ACT inhaler Commonly known as:  PROVENTIL HFA;VENTOLIN HFA Inhale 2 puffs into the lungs every 6 (six) hours as needed for wheezing.   citalopram 20 MG tablet Commonly known as:  CELEXA Take 20 mg by mouth daily.   FISH OIL PO Take 1 tablet by mouth daily.   Fluticasone-Salmeterol 500-50 MCG/DOSE Aepb Commonly known as:  ADVAIR Inhale 1 puff into the lungs every 12 (twelve) hours.   HYDROcodone-acetaminophen 5-325 MG tablet Commonly known as:  NORCO/VICODIN Take 1 tablet by mouth every 4 (four) hours as needed for moderate pain.   naproxen 500 MG tablet Commonly known as:  NAPROSYN Take 1 tablet (500 mg total) by mouth 2 (two) times daily with a meal.   PRENATAL PO Take 1 tablet by mouth daily.   risperiDONE 2 MG tablet Commonly known as:  RISPERDAL Take 2 mg by mouth daily.   sulfamethoxazole-trimethoprim 800-160 MG tablet Commonly known as:  BACTRIM DS,SEPTRA DS Take 1 tablet by mouth 2 (two) times daily.        Review of Systems     Objective:   Physical Exam  Constitutional: She is oriented to person, place, and time. She appears well-developed and well-nourished.  HENT:  Head: Normocephalic and atraumatic.  Right Ear: External ear normal.  Left Ear: External ear normal.  Eyes: No scleral icterus.  Cardiovascular: Normal rate, regular rhythm and normal heart sounds.   Pulmonary/Chest:  Effort normal and breath sounds normal.  Abdominal: Soft.  Neurological: She is alert and oriented to person, place, and time.  Skin: Skin is warm and dry.  Psychiatric: She has a normal mood and affect. Her behavior is normal. Judgment and thought content normal.    BP 119/67 (BP Location: Left Arm)   Pulse 98   Wt 186 lb (84.4 kg)   BMI 30.02 kg/m       Assessment & Plan:  CT results show pulmonary nodules; Recommend smoking cessation  FU in 1 mo and  FU Chest CT in 3- 6 mo  Labs today Met C and A1C  Start Ibuprofen 800 mg every 8 hrs PRN and Baclofen 10 mg, 3 times daily. Pt advised to limit use. I have done the exam and reviewed the above chart and it is accurate to the best of my knowledge. Development worker, community has been used in this note in any air is in the dictation or transcription are unintentional. Miguel Aschoff MD

## 2016-09-26 ENCOUNTER — Ambulatory Visit: Payer: Self-pay | Admitting: Family Medicine

## 2016-09-26 VITALS — BP 133/86 | HR 94 | Temp 98.2°F | Resp 16 | Ht 67.0 in | Wt 182.9 lb

## 2016-09-26 DIAGNOSIS — R079 Chest pain, unspecified: Secondary | ICD-10-CM

## 2016-09-26 DIAGNOSIS — Z720 Tobacco use: Secondary | ICD-10-CM

## 2016-09-26 DIAGNOSIS — E119 Type 2 diabetes mellitus without complications: Secondary | ICD-10-CM

## 2016-09-26 DIAGNOSIS — R918 Other nonspecific abnormal finding of lung field: Secondary | ICD-10-CM

## 2016-09-26 HISTORY — DX: Other nonspecific abnormal finding of lung field: R91.8

## 2016-09-26 LAB — GLUCOSE, POCT (MANUAL RESULT ENTRY): POC Glucose: 154 mg/dl — AB (ref 70–99)

## 2016-09-26 NOTE — Progress Notes (Signed)
BP 133/86   Pulse 94   Temp 98.2 F (36.8 C)   Resp 16   Ht '5\' 7"'$  (1.702 m)   Wt 182 lb 14.4 oz (83 kg)   BMI 28.65 kg/m    Subjective:    Patient ID: Erica Gomez, female    DOB: May 08, 1965, 51 y.o.   MRN: [id number]  HPI: Erica Gomez is a 51 y.o. female  Chief Complaint  Patient presents with  . Follow-up    HPI  Pt f/u for nodules on her lung. Scribe typed during the visit, MD reviewed later Reviewed scan of chest from 08/11/16. Reviewed recent lab results.  Pt reports pain under L arm and sometimes SOB. Pt reports stress symptoms and smokes 3 cigs back to back.  Reviewed symptoms of heart attack and told pt to call 911 if experienced.  Type 2 diabetes; not taking asspirin (advised to start aspirin right away)  Patient had a chest CT done August 11, 2016 Impression from that report copied and pasted below: IMPRESSION: 1. Scattered sub solid nodules within each lung, each 4-5 mm in size, locations detailed above. Non-contrast chest CT at 3-6 months is recommended. If nodules persist and are stable at that time, consider additional non-contrast chest CT examinations at 2 and 4 years. This recommendation follows the consensus statement: Guidelines for Management of Incidental Pulmonary Nodules Detected on CT Images: From the Fleischner Society 2017; Radiology 2017; 284:228-243. 2. Remainder of the chest CT is unremarkable, as detailed above. No source for mid upper centralized back pain identified. No acute or suspicious osseous finding. No significant degenerative change in the thoracic spine. Paravertebral soft tissues and soft tissues of the flank are unremarkable.   Electronically Signed   By: Franki Cabot M.D.   On: 08/11/2016 10:13  Relevant past medical, surgical, family and social history reviewed Past Medical History:  Diagnosis Date  . Allergy   . Anxiety   . Asthma   . Bipolar affective, manic ([place]) 1996  . COPD (chronic obstructive  pulmonary disease) ([place])   . Depression   . Lung nodule, multiple 09/26/2016  . Manic depressive illness ([place]) 1996  . Seizures ([place])   . Substance abuse   . Tobacco abuse 10/04/2016   Past Surgical History:  Procedure Laterality Date  . ABDOMINAL HYSTERECTOMY    . TUBAL LIGATION     Family History  Problem Relation Age of Onset  . Heart attack Father    Social History   Social History  . Marital status: Divorced    Spouse name: N/A  . Number of children: N/A  . Years of education: N/A   Occupational History  . Not on file.   Social History Main Topics  . Smoking status: Current Every Day Smoker    Packs/day: 0.50    Years: 36.00    Types: Cigarettes  . Smokeless tobacco: Current User    Types: Chew, Snuff     Comment: Using chew to wean herself off cigarettes  . Alcohol use No     Comment: history  . Drug use: Yes    Types: "Crack" cocaine     Comment: history 2013  . Sexual activity: Yes    Birth control/ protection: Condom, None   Other Topics Concern  . Not on file   Social History Narrative  . No narrative on file   Interim medical history since last visit reviewed. Allergies and medications reviewed  Review of Systems Per HPI unless  specifically indicated above      Objective:    BP 133/86   Pulse 94   Temp 98.2 F (36.8 C)   Resp 16   Ht '5\' 7"'$  (1.702 m)   Wt 182 lb 14.4 oz (83 kg)   BMI 28.65 kg/m   Wt Readings from Last 3 Encounters:  09/26/16 182 lb 14.4 oz (83 kg)  09/07/16 186 lb (84.4 kg)  05/23/16 182 lb (82.6 kg)    Physical Exam  Constitutional: She is oriented to person, place, and time. She appears well-developed and well-nourished. No distress.  Eyes: No scleral icterus.  Cardiovascular: Normal rate and regular rhythm.   Pulmonary/Chest: Effort normal. She has wheezes.  Musculoskeletal: She exhibits no edema.  Neurological: She is alert and oriented to person, place, and time.  Psychiatric: She has a normal mood and  affect.  Dorsalis pedis is good. Small bunions. No caluses or sores.  Diabetic Foot Form - Detailed   Diabetic Foot Exam - detailed Diabetic Foot exam was performed with the following findings:  Yes 09/26/2016  6:35 PM  Visual Foot Exam completed.:  Yes  Pulse Foot Exam completed.:  Yes  Right Dorsalis Pedis:  Present Left Dorsalis Pedis:  Present  Sensory Foot Exam Completed.:  Yes Semmes-Weinstein Monofilament Test R Site 1-Great Toe:  Pos L Site 1-Great Toe:  Pos          Assessment & Plan:   Problem List Items Addressed This Visit      Endocrine   Diabetes ([place])    Foot exam by MD      Relevant Medications   aspirin EC 81 MG tablet     Other   Tobacco abuse    Advised patient to quit smoking      Lung nodule, multiple - Primary    Discussed need for f/u CT scan in Sept. Reviewed CT scan with her in detail. There are already two CT scans ordered in the chart, one ordered in June and another ordered in July, so I will not re-order the CT scan of the chest. Encouraged her to contact office if she does not hear about the scheduling (date/time) so she does not slip through any crack. Very important to get this done. She agrees and verbalizes understanding Quit smoking stressed      Relevant Medications   aspirin EC 81 MG tablet   Other Relevant Orders   POCT Glucose (CBG) (Completed)   Lipid panel (Completed)   Ambulatory referral to Cardiology   Comp Met (CMET) (Completed)   Hemoglobin A1c (Completed)    Other Visit Diagnoses    Diabetes mellitus without complication (HCC)       Relevant Medications   aspirin EC 81 MG tablet   Other Relevant Orders   POCT Glucose (CBG) (Completed)   Lipid panel (Completed)   Ambulatory referral to Cardiology   Comp Met (CMET) (Completed)   Hemoglobin A1c (Completed)   Chest pain, unspecified type       Add aspirin. Refer to cardio. advised patient to call 911 if symptoms recur   Relevant Medications   aspirin EC 81 MG tablet    Other Relevant Orders   POCT Glucose (CBG) (Completed)   Lipid panel (Completed)   Ambulatory referral to Cardiology   Comp Met (CMET) (Completed)   Hemoglobin A1c (Completed)       Follow up plan: Return in about 3 months (around 12/27/2016).   Begin pt on baby aspirin, '81mg'$ . Chest  pain. Referral to cardiology.  Encouraged pt to quit smoking.  Counseled pt on decreasing fats in diet.   F/u in 1 week after CT scan. Approx Oct.  An after-visit summary was printed and given to the patient at [facility].  Please see the patient instructions which may contain other information and recommendations beyond what is mentioned above in the assessment and plan.  Meds ordered this encounter  Medications  . aspirin EC 81 MG tablet    Sig: Take 1 tablet (81 mg total) by mouth daily. No ibuprofen for at least one hour    Orders Placed This Encounter  Procedures  . Lipid panel  . Comp Met (CMET)  . Hemoglobin A1c  . Ambulatory referral to Cardiology  . POCT Glucose (CBG)

## 2016-09-26 NOTE — Patient Instructions (Addendum)
Steps to Quit Smoking Smoking tobacco can be bad for your health. It can also affect almost every organ in your body. Smoking puts you and people around you at risk for many serious long-lasting (chronic) diseases. Quitting smoking is hard, but it is one of the best things that you can do for your health. It is never too late to quit. What are the benefits of quitting smoking? When you quit smoking, you lower your risk for getting serious diseases and conditions. They can include:  Lung cancer or lung disease.  Heart disease.  Stroke.  Heart attack.  Not being able to have children (infertility).  Weak bones (osteoporosis) and broken bones (fractures).  If you have coughing, wheezing, and shortness of breath, those symptoms may get better when you quit. You may also get sick less often. If you are pregnant, quitting smoking can help to lower your chances of having a baby of low birth weight. What can I do to help me quit smoking? Talk with your doctor about what can help you quit smoking. Some things you can do (strategies) include:  Quitting smoking totally, instead of slowly cutting back how much you smoke over a period of time.  Going to in-person counseling. You are more likely to quit if you go to many counseling sessions.  Using resources and support systems, such as: ? Online chats with a counselor. ? Phone quitlines. ? Printed self-help materials. ? Support groups or group counseling. ? Text messaging programs. ? Mobile phone apps or applications.  Taking medicines. Some of these medicines may have nicotine in them. If you are pregnant or breastfeeding, do not take any medicines to quit smoking unless your doctor says it is okay. Talk with your doctor about counseling or other things that can help you.  Talk with your doctor about using more than one strategy at the same time, such as taking medicines while you are also going to in-person counseling. This can help make  quitting easier. What things can I do to make it easier to quit? Quitting smoking might feel very hard at first, but there is a lot that you can do to make it easier. Take these steps:  Talk to your family and friends. Ask them to support and encourage you.  Call phone quitlines, reach out to support groups, or work with a counselor.  Ask people who smoke to not smoke around you.  Avoid places that make you want (trigger) to smoke, such as: ? Bars. ? Parties. ? Smoke-break areas at work.  Spend time with people who do not smoke.  Lower the stress in your life. Stress can make you want to smoke. Try these things to help your stress: ? Getting regular exercise. ? Deep-breathing exercises. ? Yoga. ? Meditating. ? Doing a body scan. To do this, close your eyes, focus on one area of your body at a time from head to toe, and notice which parts of your body are tense. Try to relax the muscles in those areas.  Download or buy apps on your mobile phone or tablet that can help you stick to your quit plan. There are many free apps, such as QuitGuide from the CDC (Centers for Disease Control and Prevention). You can find more support from smokefree.gov and other websites.  This information is not intended to replace advice given to you by your health care provider. Make sure you discuss any questions you have with your health care provider. Document Released: 11/26/2008 Document   Revised: 09/28/2015 Document Reviewed: 06/16/2014 Elsevier Interactive Patient Education  2018 Elsevier Inc.  

## 2016-09-26 NOTE — Assessment & Plan Note (Addendum)
Discussed need for f/u CT scan in Sept. Reviewed CT scan with her in detail. There are already two CT scans ordered in the chart, one ordered in June and another ordered in July, so I will not re-order the CT scan of the chest. Encouraged her to contact office if she does not hear about the scheduling (date/time) so she does not slip through any crack. Very important to get this done. She agrees and verbalizes understanding Quit smoking stressed

## 2016-09-27 LAB — COMPREHENSIVE METABOLIC PANEL
ALT: 14 IU/L (ref 0–32)
AST: 15 IU/L (ref 0–40)
Albumin/Globulin Ratio: 1.6 (ref 1.2–2.2)
Albumin: 4.4 g/dL (ref 3.5–5.5)
Alkaline Phosphatase: 82 IU/L (ref 39–117)
BUN/Creatinine Ratio: 13 (ref 9–23)
BUN: 16 mg/dL (ref 6–24)
Bilirubin Total: <0.2 mg/dL (ref 0.0–1.2)
CO2: 26 mmol/L (ref 20–29)
Calcium: 9.8 mg/dL (ref 8.7–10.2)
Chloride: 102 mmol/L (ref 96–106)
Creatinine, Ser: 1.26 mg/dL — ABNORMAL HIGH (ref 0.57–1.00)
GFR calc Af Amer: 57 mL/min/{1.73_m2} — ABNORMAL LOW (ref >59)
GFR calc non Af Amer: 50 mL/min/{1.73_m2} — ABNORMAL LOW (ref >59)
Globulin, Total: 2.7 g/dL (ref 1.5–4.5)
Glucose: 121 mg/dL — ABNORMAL HIGH (ref 65–99)
Potassium: 4.1 mmol/L (ref 3.5–5.2)
Sodium: 143 mmol/L (ref 134–144)
Total Protein: 7.1 g/dL (ref 6.0–8.5)

## 2016-09-27 LAB — LIPID PANEL
Chol/HDL Ratio: 4 ratio (ref 0.0–4.4)
Cholesterol, Total: 172 mg/dL (ref 100–199)
HDL: 43 mg/dL (ref >39)
LDL Calculated: 105 mg/dL — ABNORMAL HIGH (ref 0–99)
Triglycerides: 121 mg/dL (ref 0–149)
VLDL Cholesterol Cal: 24 mg/dL (ref 5–40)

## 2016-09-27 LAB — HEMOGLOBIN A1C
Est. average glucose Bld gHb Est-mCnc: 140 mg/dL
Hgb A1c MFr Bld: 6.5 % — ABNORMAL HIGH (ref 4.8–5.6)

## 2016-10-04 ENCOUNTER — Encounter: Payer: Self-pay | Admitting: Family Medicine

## 2016-10-04 DIAGNOSIS — Z720 Tobacco use: Secondary | ICD-10-CM

## 2016-10-04 HISTORY — DX: Tobacco use: Z72.0

## 2016-10-04 NOTE — Assessment & Plan Note (Signed)
Advised patient to quit smoking

## 2016-10-04 NOTE — Assessment & Plan Note (Signed)
Foot exam by MD 

## 2016-11-07 ENCOUNTER — Telehealth: Payer: Self-pay | Admitting: Pharmacist

## 2016-11-07 NOTE — Telephone Encounter (Signed)
11/07/16 Placed refill online with [facility] for Advair & Ventolin, to release 01/04/17, order# [id number].[name]

## 2016-12-11 ENCOUNTER — Ambulatory Visit: Payer: Self-pay

## 2016-12-12 ENCOUNTER — Ambulatory Visit
Admission: RE | Admit: 2016-12-12 | Discharge: 2016-12-12 | Disposition: A | Discharge status: Home / Self Care | Payer: Self-pay | Source: Ambulatory Visit | Attending: Family Medicine | Admitting: Family Medicine

## 2016-12-12 DIAGNOSIS — E119 Type 2 diabetes mellitus without complications: Secondary | ICD-10-CM

## 2016-12-18 NOTE — Progress Notes (Deleted)
New Outpatient Visit Date: 12/19/2016  Referring Provider: No referring provider defined for this encounter.  Chief Complaint: ***  HPI:  Ms. Erica Gomez is a 51 y.o. female who is being seen today for the evaluation of chest pain at the request of No ref. provider found. She has a history of diabetes mellitus, COPD, seizure disorder, anxiety, and bipolar disorder. ***  --------------------------------------------------------------------------------------------------  Cardiovascular History & Procedures: Cardiovascular Problems:   Chest pain  Risk Factors:  Diabetes mellitus and tobacco use  Cath/PCI:  ***  CV Surgery:  ***  EP Procedures and Devices:  ***  Non-Invasive Evaluation(s):  CT chest without contrast (12/12/16): Scattered coronary artery calcifications are present. Scattered groundglass opacities in both lungs, right greater than left, stable from prior study in 07/2016.  Recent CV Pertinent Labs: Lab Results  Component Value Date   CHOL 172 09/26/2016   HDL 43 09/26/2016   LDLCALC 105 (H) 09/26/2016   TRIG 121 09/26/2016   CHOLHDL 4.0 09/26/2016   K 4.1 09/26/2016   K 4.1 11/12/2013   BUN 16 09/26/2016   BUN 9 11/12/2013   CREATININE 1.26 (H) 09/26/2016   CREATININE 1.08 11/12/2013    --------------------------------------------------------------------------------------------------  Past Medical History:  Diagnosis Date  . Allergy   . Anxiety   . Asthma   . Bipolar affective, manic ([place]) 1996  . COPD (chronic obstructive pulmonary disease) ([place])   . Depression   . Lung nodule, multiple 09/26/2016  . Manic depressive illness ([place]) 1996  . Seizures ([place])   . Substance abuse   . Tobacco abuse 10/04/2016    Past Surgical History:  Procedure Laterality Date  . ABDOMINAL HYSTERECTOMY    . TUBAL LIGATION      No outpatient medications have been marked as taking for the 12/19/16 encounter (Appointment) with Adalynn Corne, Harrell Gave, MD.     Allergies: Flax seed oil [bio-flax]  Social History   Socioeconomic History  . Marital status: Divorced    Spouse name: Not on file  . Number of children: Not on file  . Years of education: Not on file  . Highest education level: Not on file  Social Needs  . Financial resource strain: Not on file  . Food insecurity - worry: Not on file  . Food insecurity - inability: Not on file  . Transportation needs - medical: Not on file  . Transportation needs - non-medical: Not on file  Occupational History  . Not on file  Tobacco Use  . Smoking status: Current Every Day Smoker    Packs/day: 0.50    Years: 36.00    Pack years: 18.00    Types: Cigarettes  . Smokeless tobacco: Current User    Types: Chew, Snuff  . Tobacco comment: Using chew to wean herself off cigarettes  Substance and Sexual Activity  . Alcohol use: No    Comment: history  . Drug use: Yes    Types: "Crack" cocaine    Comment: history 2013  . Sexual activity: Yes    Birth control/protection: Condom, None  Other Topics Concern  . Not on file  Social History Narrative  . Not on file    Family History  Problem Relation Age of Onset  . Heart attack Father     Review of Systems: A 12-system review of systems was performed and was negative except as noted in the HPI.  --------------------------------------------------------------------------------------------------  Physical Exam: There were no vitals taken for this visit.  General:  *** HEENT: No conjunctival pallor or  scleral icterus. Moist mucous membranes. OP clear. Neck: Supple without lymphadenopathy, thyromegaly, JVD, or HJR. No carotid bruit. Lungs: Normal work of breathing. Clear to auscultation bilaterally without wheezes or crackles. Heart: Regular rate and rhythm without murmurs, rubs, or gallops. Non-displaced PMI. Abd: Bowel sounds present. Soft, NT/ND without hepatosplenomegaly Ext: No lower extremity edema. Radial, PT, and DP pulses are  2+ bilaterally Skin: Warm and dry without rash. Neuro: CNIII-XII intact. Strength and fine-touch sensation intact in upper and lower extremities bilaterally. Psych: Normal mood and affect.  EKG:  ***  Lab Results  Component Value Date   WBC 8.6 05/11/2016   HGB 11.9 05/11/2016   HCT 36.7 05/11/2016   MCV 88 05/11/2016   PLT 302 05/11/2016    Lab Results  Component Value Date   NA 143 09/26/2016   K 4.1 09/26/2016   CL 102 09/26/2016   CO2 26 09/26/2016   BUN 16 09/26/2016   CREATININE 1.26 (H) 09/26/2016   GLUCOSE 121 (H) 09/26/2016   ALT 14 09/26/2016    Lab Results  Component Value Date   CHOL 172 09/26/2016   HDL 43 09/26/2016   LDLCALC 105 (H) 09/26/2016   TRIG 121 09/26/2016   CHOLHDL 4.0 09/26/2016     --------------------------------------------------------------------------------------------------  ASSESSMENT AND PLAN: [name], MD 12/18/2016 8:40 AM

## 2016-12-19 ENCOUNTER — Ambulatory Visit: Payer: Self-pay | Admitting: Internal Medicine

## 2017-02-01 ENCOUNTER — Ambulatory Visit: Payer: Self-pay | Admitting: Urology

## 2017-02-01 VITALS — BP 133/84 | HR 110 | Temp 98.0°F | Wt 184.4 lb

## 2017-02-01 DIAGNOSIS — Z720 Tobacco use: Secondary | ICD-10-CM

## 2017-02-01 DIAGNOSIS — J0110 Acute frontal sinusitis, unspecified: Secondary | ICD-10-CM

## 2017-02-01 MED ORDER — NICOTINE 21 MG/24HR TD PT24: 21.0000 mg | MEDICATED_PATCH | Freq: Every day | TRANSDERMAL | 0 refills | Status: DC

## 2017-02-01 MED ORDER — SULFAMETHOXAZOLE-TRIMETHOPRIM 800-160 MG PO TABS: 1.0000 | ORAL_TABLET | Freq: Two times a day (BID) | ORAL | 0 refills | Status: DC

## 2017-02-01 MED ORDER — AMOXICILLIN-POT CLAVULANATE 875-125 MG PO TABS: 1.0000 | ORAL_TABLET | Freq: Two times a day (BID) | ORAL | 0 refills | Status: DC

## 2017-02-01 MED ORDER — NICOTINE 10 MG IN INHA: RESPIRATORY_TRACT | 0 refills | Status: DC

## 2017-02-01 NOTE — Progress Notes (Signed)
  Patient: Erica Gomez Female    DOB: November 12, 1965   51 y.o.   MRN: [id number] Visit Date: 02/01/2017  Today's Provider: Zara Council, PA-C   Chief Complaint  Patient presents with  . URI    severe cough, choking from phlegm, patient also has CT related questions from last scan  . Nicotine Dependence    pt is ready to quit smoking, would like counseling if possible   Subjective:    URI   This is a new problem. The current episode started 1 to 4 weeks ago. The problem has been gradually worsening. There has been no fever. Associated symptoms include chest pain, coughing, diarrhea, headaches, a plugged ear sensation, sinus pain and wheezing. Pertinent negatives include no abdominal pain, congestion, dysuria, ear pain, joint pain, joint swelling, nausea, neck pain, rash, rhinorrhea, sneezing, sore throat, swollen glands or vomiting. She has tried decongestant for the symptoms. The treatment provided mild relief.   Feels like she is a "slave" to the nicotine.  She wants to quit.      Allergies  Allergen Reactions  . Flax Seed Oil [Bio-Flax] Hives    Pt. Self reported   This SmartLink is deprecated. Use AVSMEDLIST instead to display the medication list for a patient.  Review of Systems  HENT: Positive for sinus pain. Negative for congestion, ear pain, rhinorrhea, sneezing and sore throat.   Respiratory: Positive for cough and wheezing.   Cardiovascular: Positive for chest pain.  Gastrointestinal: Positive for diarrhea. Negative for abdominal pain, nausea and vomiting.  Genitourinary: Negative for dysuria.  Musculoskeletal: Negative for joint pain and neck pain.  Skin: Negative for rash.  Neurological: Positive for headaches.    Social History   Tobacco Use  . Smoking status: Current Every Day Smoker    Packs/day: 0.50    Years: 36.00    Pack years: 18.00    Types: Cigarettes  . Smokeless tobacco: Current User    Types: Chew, Snuff  . Tobacco comment: Using chew to wean  herself off cigarettes  Substance Use Topics  . Alcohol use: No    Comment: history   Objective:   BP 133/84 (BP Location: Left Arm, Patient Position: Sitting, Cuff Size: Normal)   Pulse (!) 110   Temp 98 F (36.7 C) (Oral)   Wt 184 lb 6.4 oz (83.6 kg)   SpO2 95%   BMI 28.88 kg/m   Physical Exam Constitutional: Well nourished. Alert and oriented, No acute distress. HEENT: [place] AT, moist mucus membranes. Trachea midline, no masses. Cardiovascular: No clubbing, cyanosis, or edema. Respiratory: Normal respiratory effort, no increased work of breathing. Skin: No rashes, bruises or suspicious lesions. Lymph: No cervical or inguinal adenopathy. Neurologic: Grossly intact, no focal deficits, moving all 4 extremities. Psychiatric: Normal mood and affect.      Assessment & Plan:     1. URI/sinusitis   - start Augmentin 875/125, one tablet x 7 days  - RTC if no improvement  2. Nicotine dependence  - given the Quit brochure  - prescribed both the nicotine patches and inhalers to Med. Management as patient had better success with the patches in the past but not sure if availability of the patches through med management      Zara Council, PA-C   [facility]

## 2017-02-14 ENCOUNTER — Telehealth: Payer: Self-pay | Admitting: Pharmacist

## 2017-02-14 NOTE — Telephone Encounter (Signed)
02/14/17 Placed refills online for Advair 500/50 & Ventolin, to release 02/27/17, order# [id number].[name]

## 2017-02-22 DIAGNOSIS — R079 Chest pain, unspecified: Secondary | ICD-10-CM | POA: Insufficient documentation

## 2017-02-22 NOTE — Progress Notes (Signed)
Cardiology Office Note  Date:  02/23/2017   ID:  Erica Gomez, DOB 09/08/1965, MRN [id number]  PCP:  System, Pcp Not In   Chief Complaint  Patient presents with  . New Patient (Initial Visit)    Referred by Dr. Sanda Klein. Chest Pain. Patient c/o Chest pressure and tightness.  Meds reviewed verbally with patient.     HPI:  52 yo woman with PMH of  Smoking, 1/2 ppd, substance abuse Type 2 diabetes Bipolar/depression Seizures Cough syncope 09/2012 CT scan chest 2018 with significant coronary calcification three-vessel, worse in the LAD Presenting by referral from Dr. Sanda Klein for consultation of her chest pain  She reports that several months ago she was having some Tightness in chest,  worse when anxious Has been having some symptoms for several months Denies having any symptoms on exertion Better with deep breathes, relaxes, symptoms go away Currently doing exercise on a track, does 1 fast loop around in the slow lap Denies having significant shortness of breath or chest tightness  Currently on the keto diet Using meal supplement,  Eating avacado, nuts "all day long" Down 8-10 pounds over the past several weeks  Works in a plant, very active at work May have carpel tunnel, tingling in her hands frequently, using wrist splints  CT scan chest October 2018 reviewed with her in detail Significant mid to distal LAD calcification, proximal RCA No significant atherosclerosis in the aorta  Previous Echo 02/2013 reviewed with her  EF 24 - 65%  EKG personally reviewed by myself on todays visit Shows normal sinus rhythm rate 78 bpm no significant ST or T wave changes  PMH:   has a past medical history of Allergy, Anxiety, Asthma, Bipolar affective, manic ([place]) (1996), COPD (chronic obstructive pulmonary disease) ([place]), Depression, Lung nodule, multiple (09/26/2016), Manic depressive illness ([place]) (1996), Seizures ([place]), Substance abuse ([place]), and Tobacco abuse (10/04/2016).  PSH:    Past  Surgical History:  Procedure Laterality Date  . ABDOMINAL HYSTERECTOMY    . TUBAL LIGATION      Current Outpatient Medications  Medication Sig Dispense Refill  . albuterol (PROVENTIL HFA;VENTOLIN HFA) 108 (90 BASE) MCG/ACT inhaler Inhale 2 puffs into the lungs every 6 (six) hours as needed for wheezing.    Marland Kitchen amoxicillin-clavulanate (AUGMENTIN) 875-125 MG tablet Take 1 tablet by mouth every 12 (twelve) hours. 14 tablet 0  . aspirin EC 81 MG tablet Take 1 tablet (81 mg total) by mouth daily. No ibuprofen for at least one hour    . baclofen (LIORESAL) 10 MG tablet Take 1 tablet (10 mg total) by mouth 3 (three) times daily. 90 each 0  . citalopram (CELEXA) 20 MG tablet Take 20 mg by mouth daily.    . Fluticasone-Salmeterol (ADVAIR) 500-50 MCG/DOSE AEPB Inhale 1 puff into the lungs every 12 (twelve) hours.    Marland Kitchen ibuprofen (ADVIL,MOTRIN) 800 MG tablet Take 1 tablet (800 mg total) by mouth every 8 (eight) hours as needed. 90 tablet 5  . nicotine (NICODERM CQ) 21 mg/24hr patch Place 1 patch (21 mg total) onto the skin daily. 28 patch 0  . nicotine (NICOTROL) 10 MG inhaler 6-16 catridges/day inhaled x 6-12 weeks, then taper dose over 6-12 week to discontinue smoking 42 each 0  . Omega-3 Fatty Acids (FISH OIL PO) Take 1 tablet by mouth daily.    . risperiDONE (RISPERDAL) 2 MG tablet Take 2 mg by mouth daily.     No current facility-administered medications for this visit.  Allergies:   Flax seed oil [bio-flax]   Social History:  The patient  reports that she has been smoking cigarettes.  She has a 18.00 pack-year smoking history. Her smokeless tobacco use includes chew and snuff. She reports that she uses drugs. Drug: "Crack" cocaine. She reports that she does not drink alcohol.   Family History:   family history includes Heart attack in her father.    Review of Systems: Review of Systems  Constitutional: Negative.   Respiratory: Negative.   Cardiovascular: Negative.   Gastrointestinal:  Negative.   Musculoskeletal: Negative.   Neurological: Negative.   Psychiatric/Behavioral: Negative.   All other systems reviewed and are negative.    PHYSICAL EXAM: VS:  BP 114/76 (BP Location: Right Arm, Patient Position: Sitting, Cuff Size: Normal)   Pulse 78   Ht 5\' 6"  (1.676 m)   Wt 176 lb 12 oz (80.2 kg)   BMI 28.53 kg/m  , BMI Body mass index is 28.53 kg/m. GEN: Well nourished, well developed, in no acute distress  HEENT: normal  Neck: no JVD, carotid bruits, or masses Cardiac: RRR; no murmurs, rubs, or gallops,no edema  Respiratory:  clear to auscultation bilaterally, normal work of breathing GI: soft, nontender, nondistended, + BS MS: no deformity or atrophy  Skin: warm and dry, no rash Neuro:  Strength and sensation are intact Psych: euthymic mood, full affect    Recent Labs: 05/11/2016: Hemoglobin 11.9; Platelets 302; TSH 1.030 09/26/2016: ALT 14; BUN 16; Creatinine, Ser 1.26; Potassium 4.1; Sodium 143    Lipid Panel Lab Results  Component Value Date   CHOL 172 09/26/2016   HDL 43 09/26/2016   LDLCALC 105 (H) 09/26/2016   TRIG 121 09/26/2016      Wt Readings from Last 3 Encounters:  02/23/17 176 lb 12 oz (80.2 kg)  02/01/17 184 lb 6.4 oz (83.6 kg)  09/26/16 182 lb 14.4 oz (83 kg)       ASSESSMENT AND PLAN:  Tobacco abuse Long discussion concerning smoking cessation,  10 minutes spent discussing options for smoking cessation We have provided a prescription for Chantix Stressed the importance of smoking cessation given coronary artery disease  Chest pain with moderate risk for cardiac etiology - Plan: EKG 12-Lead Atypical in nature Currently exercising on a regular basis with good exercise tolerance and no chest pain symptoms We did offer stress testing including stress Myoview, stress echo She has declined at this time.  Recommended she call us if she has any anginal symptoms  Centrilobular emphysema ([place]) Long discussion concerning smoking  cessation Treatment as above  Type 2 diabetes mellitus without complication, without long-term current use of insulin ([place]) Recommended she continue her strict diet, low carbohydrate, aggressive exercise  Coronary artery disease Significant coronary calcification seen on CT scan 2018 Long history of smoking Stressed importance of smoking cessation We will start Crestor 10 mg daily goal LDL less than 70, preferably 60 Stressed importance of weight loss as she is prediabetic  Disposition:   F/U  12 months   Total encounter time more than 60 minutes  Greater than 50% was spent in counseling and coordination of care with the patient  Patient was seen in consultation for Dr. Sanda Klein and will be referred back to her office for ongoing care of the issues detailed above  Orders Placed This Encounter  Procedures  . EKG 12-Lead     Signed, Esmond Plants, M.D., Ph.D. 02/23/2017  [place], [place]

## 2017-02-23 ENCOUNTER — Telehealth: Payer: Self-pay | Admitting: Pharmacist

## 2017-02-23 ENCOUNTER — Ambulatory Visit (INDEPENDENT_AMBULATORY_CARE_PROVIDER_SITE_OTHER): Payer: Self-pay | Admitting: Cardiovascular Disease

## 2017-02-23 ENCOUNTER — Encounter: Payer: Self-pay | Admitting: Cardiovascular Disease

## 2017-02-23 VITALS — BP 114/76 | HR 78 | Ht 66.0 in | Wt 176.8 lb

## 2017-02-23 DIAGNOSIS — J432 Centrilobular emphysema: Secondary | ICD-10-CM

## 2017-02-23 DIAGNOSIS — E119 Type 2 diabetes mellitus without complications: Secondary | ICD-10-CM

## 2017-02-23 DIAGNOSIS — I2510 Atherosclerotic heart disease of native coronary artery without angina pectoris: Secondary | ICD-10-CM | POA: Insufficient documentation

## 2017-02-23 DIAGNOSIS — I25118 Atherosclerotic heart disease of native coronary artery with other forms of angina pectoris: Secondary | ICD-10-CM

## 2017-02-23 DIAGNOSIS — Z720 Tobacco use: Secondary | ICD-10-CM

## 2017-02-23 DIAGNOSIS — R079 Chest pain, unspecified: Secondary | ICD-10-CM

## 2017-02-23 MED ORDER — VARENICLINE TARTRATE 1 MG PO TABS: 1.0000 mg | ORAL_TABLET | Freq: Two times a day (BID) | ORAL | 3 refills | Status: DC

## 2017-02-23 MED ORDER — ROSUVASTATIN CALCIUM 10 MG PO TABS: 10.0000 mg | ORAL_TABLET | Freq: Every day | ORAL | 4 refills | Status: DC

## 2017-02-23 NOTE — Telephone Encounter (Signed)
02/23/17 I have received provider portion of Pfizer application for Nicotrol Inhaler, I am holding for patient to return her portion, mailed to patient 02/02/17.Erica Gomez

## 2017-02-23 NOTE — Patient Instructions (Addendum)
Call for any chest pains with exertion (on the track)   Medication Instructions:   Please start crestor one a day for cholesterol Goal LDL 60 Right now 100  Start chantix 1/2 pill twice a day for a few weeks Then up to a full pill twice a day   Labwork:  No new labs needed  Testing/Procedures:  No further testing at this time   Follow-Up: It was a pleasure seeing you in the office today. Please call us if you have new issues that need to be addressed before your next appt.  [phone]  Your physician wants you to follow-up in: 12 months.  You will receive a reminder letter in the mail two months in advance. If you don't receive a letter, please call our office to schedule the follow-up appointment.  If you need a refill on your cardiac medications before your next appointment, please call your pharmacy.

## 2017-02-28 ENCOUNTER — Telehealth: Payer: Self-pay | Admitting: Cardiovascular Disease

## 2017-02-28 NOTE — Telephone Encounter (Signed)
Erica Gomez from [facility] dropped off paperwork to be completed and signed Placed in Nurse Box

## 2017-03-01 ENCOUNTER — Telehealth: Payer: Self-pay | Admitting: Pharmacist

## 2017-03-01 NOTE — Telephone Encounter (Signed)
03/01/17 Received a pharmacy printout for new med- Chantix Starter month-Take 0.5mg  once daily for 3 days, then 0.5mg  twice daily for 4 days, then 1 mg two times a day starting on day 8 for 11 weeks. Stop smoking on Day 4.  Chantix 1mg  continuing pack-Take 1 tablet by mouth 2 times a day for 11 weeks.  I have added med, Psychologist, counselling will take to Dr. Rockey Situ @ [facility] to sign, also mailing patient her portion to sign & return, also requesting more current monthly income. We have Feb/March in patient chart. [name]

## 2017-04-11 ENCOUNTER — Telehealth: Payer: Self-pay | Admitting: Pharmacy Technician

## 2017-04-11 NOTE — Telephone Encounter (Signed)
Received updated proof of income.  Patient eligible to receive medication assistance at [facility] through 2019, as long as eligibility requirements continue to be met.  [facility]

## 2017-04-13 ENCOUNTER — Telehealth: Payer: Self-pay | Admitting: Pharmacist

## 2017-04-13 NOTE — Telephone Encounter (Signed)
/  02/2017 3:05:45 PM - Chantix  04/13/17 I have faxed Pfizer application for Chantix (Starting month box) Take 0.5mg  by mouth once daily for 3 days, then take 0.5mg  by mouth twice daily for 4 days, then take 1mg  by mouth twice daily for 11 weeks, Stop smoking on Day 4. Also Chantix 1mg  Take 1 tablet by mouth two times a day for 11 weeks.[name]

## 2017-04-16 ENCOUNTER — Ambulatory Visit: Payer: Self-pay

## 2017-04-16 ENCOUNTER — Encounter (INDEPENDENT_AMBULATORY_CARE_PROVIDER_SITE_OTHER): Payer: Self-pay

## 2017-04-16 ENCOUNTER — Other Ambulatory Visit: Payer: Self-pay

## 2017-04-16 VITALS — BP 118/72 | Ht 66.0 in | Wt 171.0 lb

## 2017-04-16 DIAGNOSIS — Z79899 Other long term (current) drug therapy: Secondary | ICD-10-CM

## 2017-04-16 NOTE — Progress Notes (Signed)
[facility] Visit Note  Patient: Erica Gomez MRN: [id number] Date of Birth: 02/16/1965 PCP: System, Pcp Not In   [place] 52 y.o. female presents for a medication therapy management visit today with the pharmacist.  BP 118/72 (BP Location: Right Arm)   Ht 5\' 6"  (1.676 m)   Wt 171 lb (77.6 kg)   BMI 27.60 kg/m   Patient Information   Past Medical History:  Diagnosis Date  . Allergy   . Anxiety   . Asthma   . Bipolar affective, manic ([place]) 1996  . COPD (chronic obstructive pulmonary disease) ([place])   . Depression   . Lung nodule, multiple 09/26/2016  . Manic depressive illness ([place]) 1996  . Seizures ([place])   . Substance abuse ([place])   . Tobacco abuse 10/04/2016      Past Surgical History:  Procedure Laterality Date  . ABDOMINAL HYSTERECTOMY    . TUBAL LIGATION       Family History  Problem Relation Age of Onset  . Stroke Mother   . Heart attack Father     New Diagnoses (since last visit):   Family Support: Poor. She states that both of her brothers are very unsupportive but that she does have a significant other who helps take care of her.   Lifestyle Diet: 2-3 meals a day  Keto - avocado, cottage cheese, protein meats (chicken, salmon), fruits, veggies Drinks: water, diet coke   Current Exercise Habits: Home exercise routine, Type of exercise: walking, Frequency (Times/Week): 3       Social History   Substance and Sexual Activity  Alcohol Use No   Comment: history      Social History   Tobacco Use  Smoking Status Current Every Day Smoker  . Packs/day: 1.00  . Years: 36.00  . Pack years: 36.00  . Types: Cigarettes  Smokeless Tobacco Current User  . Types: Chew, Snuff  Tobacco Comment   Using chew to wean herself off cigarettes      Health Maintenance  Topic Date Due  . PNEUMOCOCCAL POLYSACCHARIDE VACCINE (1) 09/29/1967  . OPHTHALMOLOGY EXAM  09/29/1975  . URINE MICROALBUMIN  09/29/1975  . HIV Screening   09/28/1980  . TETANUS/TDAP  09/28/1984  . PAP SMEAR  09/29/1986  . MAMMOGRAM  09/29/2015  . COLONOSCOPY  09/29/2015  . INFLUENZA VACCINE  09/13/2016  . HEMOGLOBIN A1C  03/29/2017  . FOOT EXAM  09/26/2017     Assessment and Plan:  Bipolar/Mood: risperdone, citalopram; The patient states that her mood is currently very well controlled by her medications. She has no complaints.   Asthma/COPD: albuterol rescue inhaler and Advair inhaler; The patient states that she has to use her rescue inhaler every day. She typically uses it 2-3 times a day. I explained that as she continues to work towards quitting smoking that her SOB will improve. She also states that she has noticed that as she has lost weight that her SOB has improved. The patient expressed understanding about quitting smoking. I encouraged her to follow up at the [facility] to talk about her inhalers with them.   Pre-diabetes: Patient is currently not on any antidiabetic medications. Her last A1c was 6.5 on 09/2016. She states that she is currently on the keto diet and has been doing really well. She does not currently check her BG at home. She says that she is going to continue eating well and trying to exercise more. The patient is also on rosuvastatin and her last  lipid panel from 09/2016 showed a TC of 172, HCL of 43, and LDL of 105. I suspect that it has improved further since then as she reports that she has lost 20 lbs in the last few months and her diet has changed.   Smoking: varenicline, nictotine inhaler. Patient is very passionate about quitting smoking. She is actively trying to cut back on her cigarette use each day. She states that she is excited to start Chantix and that she does not like to use the nicotine inhaler but uses it when she doesn't want to smoke a cigarette.   Muscle pain: Patient was previously on baclofen and ibuprofen 800mg . She states that she still has some muscle aches especially after work where her motions are  very repetitive. She states that she would like to be back on baclofen. I encouraged her to go to the [facility] to be evaluated.   I saw Ms. Chrisp today for her MTM appointment. She was very pleasant and very motivated about her health. She stated that she has greatly improved her diet and that she is trying to exercise more. She is also very motivated to stop smoking and is looking forward to trying Chantix. I scheduled a 6 month follow up appointment with her.   Lendon Ka, PharmD Pharmacy Resident

## 2017-05-08 ENCOUNTER — Telehealth: Payer: Self-pay | Admitting: Pharmacist

## 2017-05-08 NOTE — Telephone Encounter (Signed)
05/08/2017 2:16:19 PM - Advair 500/50 & Ventolin refills  05/08/17 Placed refill online with [facility] for Advair 500/50 & Ventolin HFA 93mcg they will ship 05/21/17, order# [id number].Erica Gomez

## 2017-05-14 ENCOUNTER — Telehealth: Payer: Self-pay | Admitting: Pharmacist

## 2017-05-14 NOTE — Telephone Encounter (Signed)
05/14/2017 11:21:14 AM - Recd [employer] request for renewal  05/14/17 I have received letter from [place] stating patient renewal ends 07/02/17. I have printed scripts for Advair 500/50 & Ventolin HFA-sending to [facility] for provider to sign, also mailing patient her portion of application to sign & return.[name]

## 2017-05-15 ENCOUNTER — Telehealth: Payer: Self-pay | Admitting: Pharmacist

## 2017-05-15 NOTE — Telephone Encounter (Signed)
05/15/2017 10:08:38 AM - Advair & Ventolin renewal  05/15/17 Patient came by office yesterday and dropped of her page of application that [place] had mailed to her. I had put a letter in the mailbox to send to patient for her to sign [employer] form and need 2018 taxes. I have pulled that letter our of mailbox-corrected letter and now only asking for patient's 2018 taxes and mailing to patient.[name]

## 2017-05-16 ENCOUNTER — Telehealth: Payer: Self-pay | Admitting: Pharmacist

## 2017-05-16 NOTE — Telephone Encounter (Signed)
05/16/2017 1:54:33 PM - Chantix 1mg   05/16/17 Called [employer] spoke with Njeri to refill Chantix 1mg , per [employer] guidelines patient can only get 5 bottles of 1mg -if patient is going to stay on 1mg , [facility] will need another script. There is (3) bottles of 56 each bottle in the pharmacy on shelf. I discussed with Christan, she has a note in QS1 to let me know when last bottle filled if patient is going to stay on this med. I will then need to send page 2 of application (in [employer]) to [facility] for provider to sign.[name]

## 2017-05-28 ENCOUNTER — Telehealth: Payer: Self-pay | Admitting: Pharmacist

## 2017-05-28 NOTE — Telephone Encounter (Signed)
05/28/2017 2:54:18 PM - Advair 500/50 & Ventolin Renewal  05/28/17 Faxed [employer] Re Enrollment application for Ventolin HFA & Advair 500/50.[name]

## 2017-06-21 ENCOUNTER — Other Ambulatory Visit: Payer: Self-pay | Admitting: Internal Medicine

## 2017-09-10 ENCOUNTER — Other Ambulatory Visit: Payer: Self-pay | Admitting: Internal Medicine

## 2017-09-26 ENCOUNTER — Emergency Department
Admission: EM | Admit: 2017-09-26 | Discharge: 2017-09-26 | Disposition: A | Discharge status: Home / Self Care | Payer: Self-pay | Attending: Emergency Medicine | Admitting: Emergency Medicine

## 2017-09-26 ENCOUNTER — Emergency Department: Payer: Self-pay

## 2017-09-26 ENCOUNTER — Encounter: Payer: Self-pay | Admitting: *Deleted

## 2017-09-26 ENCOUNTER — Other Ambulatory Visit: Payer: Self-pay

## 2017-09-26 DIAGNOSIS — F17210 Nicotine dependence, cigarettes, uncomplicated: Secondary | ICD-10-CM | POA: Insufficient documentation

## 2017-09-26 DIAGNOSIS — J449 Chronic obstructive pulmonary disease, unspecified: Secondary | ICD-10-CM | POA: Insufficient documentation

## 2017-09-26 DIAGNOSIS — Z79899 Other long term (current) drug therapy: Secondary | ICD-10-CM | POA: Insufficient documentation

## 2017-09-26 DIAGNOSIS — M25511 Pain in right shoulder: Secondary | ICD-10-CM | POA: Insufficient documentation

## 2017-09-26 DIAGNOSIS — Y33XXXA Other specified events, undetermined intent, initial encounter: Secondary | ICD-10-CM | POA: Insufficient documentation

## 2017-09-26 DIAGNOSIS — Y9389 Activity, other specified: Secondary | ICD-10-CM | POA: Insufficient documentation

## 2017-09-26 DIAGNOSIS — M75101 Unspecified rotator cuff tear or rupture of right shoulder, not specified as traumatic: Secondary | ICD-10-CM | POA: Insufficient documentation

## 2017-09-26 DIAGNOSIS — Y998 Other external cause status: Secondary | ICD-10-CM | POA: Insufficient documentation

## 2017-09-26 DIAGNOSIS — Y929 Unspecified place or not applicable: Secondary | ICD-10-CM | POA: Insufficient documentation

## 2017-09-26 DIAGNOSIS — Z7982 Long term (current) use of aspirin: Secondary | ICD-10-CM | POA: Insufficient documentation

## 2017-09-26 DIAGNOSIS — I2510 Atherosclerotic heart disease of native coronary artery without angina pectoris: Secondary | ICD-10-CM | POA: Insufficient documentation

## 2017-09-26 DIAGNOSIS — E119 Type 2 diabetes mellitus without complications: Secondary | ICD-10-CM | POA: Insufficient documentation

## 2017-09-26 DIAGNOSIS — W19XXXA Unspecified fall, initial encounter: Secondary | ICD-10-CM

## 2017-09-26 MED ORDER — HYDROCODONE-ACETAMINOPHEN 5-325 MG PO TABS
1.0000 | ORAL_TABLET | ORAL | Status: AC
  Administered 2017-09-26: 1 via ORAL
  Filled 2017-09-26: qty 1

## 2017-09-26 MED ORDER — PREDNISONE 10 MG PO TABS: 10.0000 mg | ORAL_TABLET | Freq: Every day | ORAL | 0 refills | Status: DC

## 2017-09-26 NOTE — ED Triage Notes (Signed)
Pt states she fell x 3 weeks ago, injuring R shoulder. Pt has had no medical treatment. For this injury. Pt has decreased mobility in R arm, unable to lift it to shoulder height. Pt states pain radiating to R shoulder and neck.

## 2017-09-26 NOTE — Discharge Instructions (Addendum)
Please take prednisone as prescribed.  He may use Tylenol for additional pain relief.  Call orthopedics tomorrow to schedule follow-up appointment return to the ER for any worsening symptoms or urgent changes in your health.

## 2017-09-26 NOTE — ED Provider Notes (Signed)
[place] EMERGENCY DEPARTMENT Provider Note   CSN: [id number] Arrival date & time: 09/26/17  1836     History   Chief Complaint Chief Complaint  Patient presents with  . Shoulder Injury    HPI Erica Gomez is a 52 y.o. female presents to the emergency department for evaluation of right shoulder pain.  Patient states she fell 3 weeks ago after a mechanical fall and landed on her right shoulder.  No head injury, loss of consciousness, nausea or vomiting.  She only developed some mild shoulder pain.  Patient's been taking intermittent episodes of ibuprofen.  Patient states she slept on her arm wrong last night developed increasing pain throughout the right shoulder.  She has pain with abduction and flexion greater than 90 degrees.  No numbness tingling or radicular symptoms.  Pain is only with shoulder range of motion and no pain with neck range of motion.  She denies any neck pain.  Her pain is 7 out of 10.  HPI  Past Medical History:  Diagnosis Date  . Allergy   . Anxiety   . Asthma   . Bipolar affective, manic ([place]) 1996  . COPD (chronic obstructive pulmonary disease) ([place])   . Depression   . Lung nodule, multiple 09/26/2016  . Manic depressive illness ([place]) 1996  . Seizures ([place])   . Substance abuse ([place])   . Tobacco abuse 10/04/2016    Patient Active Problem List   Diagnosis Date Noted  . CAD (coronary artery disease), native coronary artery 02/23/2017  . Chest pain 02/22/2017  . Chest pain with moderate risk for cardiac etiology 02/22/2017  . Tobacco abuse 10/04/2016  . Lung nodule, multiple 09/26/2016  . Diabetes ([place]) 04/03/2014  . COPD (chronic obstructive pulmonary disease) ([place]) 12/17/2012  . Seizure ([place]) 12/17/2012    Past Surgical History:  Procedure Laterality Date  . ABDOMINAL HYSTERECTOMY    . TUBAL LIGATION       OB History   None      Home Medications    Prior to Admission medications   Medication Sig Start Date  End Date Taking? Authorizing Provider  ADVAIR DISKUS 500-50 MCG/DOSE AEPB INHALE 1 PUFF 2 TIMES A DAY. RINSE MOUTH AND SPIT AFTER EACH USE. 06/21/17   Tawni Millers, MD  aspirin EC 81 MG tablet Take 1 tablet (81 mg total) by mouth daily. No ibuprofen for at least one hour 09/26/16   Arnetha Courser, MD  citalopram (CELEXA) 20 MG tablet Take 20 mg by mouth daily.    [provider]  nicotine (NICOTROL) 10 MG inhaler 6-16 catridges/day inhaled x 6-12 weeks, then taper dose over 6-12 week to discontinue smoking 02/01/17   Zara Council A, PA-C  predniSONE (DELTASONE) 10 MG tablet Take 1 tablet (10 mg total) by mouth daily. 6,5,4,3,2,1 six day taper 09/26/17   Duanne Guess, PA-C  risperiDONE (RISPERDAL) 2 MG tablet Take 2 mg by mouth daily.    [provider]  rosuvastatin (CRESTOR) 10 MG tablet Take 1 tablet (10 mg total) by mouth daily. 02/23/17   Minna Merritts, MD  varenicline (CHANTIX CONTINUING MONTH PAK) 1 MG tablet Take 1 tablet (1 mg total) by mouth 2 (two) times daily. 02/23/17   Minna Merritts, MD  VENTOLIN HFA 108 (90 Base) MCG/ACT inhaler INHALE 2 PUFFS EVERY 4 TO 6 HOURS AS NEEDED 09/10/17   Tawni Millers, MD    Family History Family History  Problem Relation Age  of Onset  . Stroke Mother   . Heart attack Father     Social History Social History   Tobacco Use  . Smoking status: Current Every Day Smoker    Packs/day: 1.00    Years: 36.00    Pack years: 36.00    Types: Cigarettes  . Smokeless tobacco: Current User    Types: Chew, Snuff  . Tobacco comment: Using chew to wean herself off cigarettes  Substance Use Topics  . Alcohol use: No    Comment: history  . Drug use: No    Types: "Crack" cocaine    Comment: history 2013     Allergies   Flax seed oil [bio-flax]   Review of Systems Review of Systems  Constitutional: Negative for activity change.  Eyes: Negative for pain and visual disturbance.  Respiratory: Negative for shortness of  breath.   Cardiovascular: Negative for chest pain and leg swelling.  Gastrointestinal: Negative for abdominal pain.  Genitourinary: Negative for flank pain and pelvic pain.  Musculoskeletal: Positive for arthralgias and myalgias. Negative for gait problem, joint swelling, neck pain and neck stiffness.  Skin: Negative for wound.  Neurological: Negative for dizziness, syncope, weakness, light-headedness, numbness and headaches.  Psychiatric/Behavioral: Negative for confusion and decreased concentration.     Physical Exam Updated Vital Signs Ht 5\' 6"  (1.676 m)   Wt 78 kg   BMI 27.76 kg/m   Physical Exam  Constitutional: She is oriented to person, place, and time. She appears well-developed and well-nourished.  HENT:  Head: Normocephalic and atraumatic.  Eyes: Conjunctivae are normal.  Neck: Normal range of motion.  Cardiovascular: Normal rate.  Pulmonary/Chest: Effort normal. No respiratory distress. She exhibits no tenderness.  Musculoskeletal: Normal range of motion.  No cervical thoracic or lumbar spinous process tenderness.  Patient has normal range of motion of cervical spine with no discomfort.  Patient has tenderness along the proximal humerus with palpation with no swelling ecchymosis or skin breakdown noted.  She is nontender along the clavicle.  She has active abduction and flexion to 90 degrees but severe pain with range of motion greater than 90 degrees.  Passively she can be increased to 120 but has pain with try to maintain this range of motion.  She has good internal and external rotation with only mild discomfort.  She has positive Hawkins impingement sign.  She is nervous intact right upper extremity.  Neurological: She is alert and oriented to person, place, and time.  Skin: Skin is warm. No rash noted.  Psychiatric: She has a normal mood and affect. Her behavior is normal. Thought content normal.     ED Treatments / Results  Labs (all labs ordered are listed, but  only abnormal results are displayed) Labs Reviewed - No data to display  EKG None  Radiology Dg Shoulder Right  Result Date: 09/26/2017 CLINICAL DATA:  Shoulder injury decreased mobility EXAM: RIGHT SHOULDER - 2+ VIEW COMPARISON:  None. FINDINGS: No fracture or dislocation. Small loose body versus remote fracture deformity at the inferior glenoid. Mild AC joint degenerative change. IMPRESSION: 1. No acute osseous abnormality. 2. Mild degenerative change of the [facility] joint and glenohumeral interval. Tiny loose body versus old fracture injury of the inferior glenoid Electronically Signed   By: Donavan Foil M.D.   On: 09/26/2017 20:03    Procedures Procedures (including critical care time)  Medications Ordered in ED Medications  HYDROcodone-acetaminophen (NORCO/VICODIN) 5-325 MG per tablet 1 tablet (has no administration in time range)  Initial Impression / Assessment and Plan / ED Course  I have reviewed the triage vital signs and the nursing notes.  Pertinent labs & imaging results that were available during my care of the patient were reviewed by me and considered in my medical decision making (see chart for details).     52 year old female with mechanical fall several weeks ago landing on her right shoulder.  Patient's pain is increased over the last few weeks.  She has symptoms concerning for possible rotator cuff injury.  No spinous process tenderness along cervical spine.  No neurological deficits.  Patient is given prednisone taper and will follow-up with orthopedics.  She understands signs and symptoms return to the ED for. Final Clinical Impressions(s) / ED Diagnoses   Final diagnoses:  Fall, initial encounter  Acute pain of right shoulder  Rotator cuff syndrome, right    ED Discharge Orders         Ordered    predniSONE (DELTASONE) 10 MG tablet  Daily     09/26/17 2021           Duanne Guess, PA-C 09/26/17 2024    Delman Kitten, MD 09/27/17 0004

## 2017-09-26 NOTE — ED Notes (Signed)
FIRST NURSE NOTE:  Pt states she fell 3 months ago on shoulder and continues to have pain.

## 2017-10-16 ENCOUNTER — Telehealth: Payer: Self-pay | Admitting: Pharmacist

## 2017-10-16 NOTE — Telephone Encounter (Signed)
10/16/2017 12:13:10 PM - Advair & Ventolin refills  10/16/17 Placed refill online with [facility] for Advair 500/50 & Ventolin HFA, to release 10/29/17, order# [id number].[name]

## 2017-10-18 ENCOUNTER — Encounter: Payer: Self-pay | Admitting: Pharmacist

## 2017-10-22 ENCOUNTER — Other Ambulatory Visit: Payer: Self-pay | Admitting: Internal Medicine

## 2017-12-04 ENCOUNTER — Telehealth: Payer: Self-pay | Admitting: Adult Health Nurse Practitioner

## 2017-12-04 NOTE — Telephone Encounter (Signed)
Called to set up follow-up appointment. No answer but left voicemail.

## 2017-12-19 ENCOUNTER — Ambulatory Visit: Payer: Worker's Compensation | Admitting: Pharmacist

## 2017-12-19 ENCOUNTER — Other Ambulatory Visit: Payer: Self-pay

## 2017-12-19 ENCOUNTER — Encounter (INDEPENDENT_AMBULATORY_CARE_PROVIDER_SITE_OTHER): Payer: Self-pay

## 2017-12-19 ENCOUNTER — Encounter: Payer: Self-pay | Admitting: Pharmacist

## 2017-12-19 VITALS — BP 116/72 | Ht 66.0 in | Wt 179.0 lb

## 2017-12-19 DIAGNOSIS — Z79899 Other long term (current) drug therapy: Secondary | ICD-10-CM

## 2017-12-19 NOTE — Progress Notes (Signed)
Medication Management Clinic Visit Note  Patient: Erica Gomez MRN: [id number] Date of Birth: 04/26/1965 PCP: System, Pcp Not In   [place] 52 y.o. female presents for 44mos f/u MTM visit today.  BP 116/72 (BP Location: Left Arm, Patient Position: Sitting, Cuff Size: Normal)   Ht 5\' 6"  (1.676 m)   Wt 179 lb (81.2 kg)   BMI 28.89 kg/m   Patient Information   Past Medical History:  Diagnosis Date  . Allergy   . Anxiety   . Asthma   . Bipolar affective, manic ([place]) 1996  . COPD (chronic obstructive pulmonary disease) ([place])   . Depression   . Lung nodule, multiple 09/26/2016  . Manic depressive illness ([place]) 1996  . Seizures ([place])   . Substance abuse ([place])   . Tobacco abuse 10/04/2016      Past Surgical History:  Procedure Laterality Date  . ABDOMINAL HYSTERECTOMY    . TUBAL LIGATION       Family History  Problem Relation Age of Onset  . Stroke Mother   . Heart attack Father      Lifestyle Diet: Struggles w/carbs  Breakfast: eggs, bacon, tomatos, sometimes fried potatoes or grits. [place] yogurt Lunch/dinner: salmon, baked chicken, salad, bologna sandwich, ham and cheese Drinks:     Current Exercise Habits: The patient does not participate in regular exercise at present       Social History   Substance and Sexual Activity  Alcohol Use No   Comment: history      Social History   Tobacco Use  Smoking Status Current Every Day Smoker  . Packs/day: 1.00  . Years: 36.00  . Pack years: 36.00  . Types: Cigarettes  Smokeless Tobacco Current User  . Types: Chew, Snuff  Tobacco Comment   Using chew to wean herself off cigarettes      Health Maintenance  Topic Date Due  . PNEUMOCOCCAL POLYSACCHARIDE VACCINE AGE 15-64 HIGH RISK  09/29/1967  . OPHTHALMOLOGY EXAM  09/29/1975  . URINE MICROALBUMIN  09/29/1975  . HIV Screening  09/28/1980  . TETANUS/TDAP  09/28/1984  . PAP SMEAR  09/29/1986  . MAMMOGRAM  09/29/2015  . COLONOSCOPY  09/29/2015   . HEMOGLOBIN A1C  03/29/2017  . INFLUENZA VACCINE  09/13/2017  . FOOT EXAM  09/26/2017   Outpatient Encounter Medications as of 12/19/2017  Medication Sig  . ADVAIR DISKUS 500-50 MCG/DOSE AEPB INHALE 1 PUFF 2 TIMES A DAY. RINSE MOUTH AND SPIT AFTER EACH USE.  Marland Kitchen aspirin EC 81 MG tablet Take 1 tablet (81 mg total) by mouth daily. No ibuprofen for at least one hour  . citalopram (CELEXA) 20 MG tablet Take 20 mg by mouth daily.  Marland Kitchen ibuprofen (ADVIL,MOTRIN) 800 MG tablet Take 800 mg by mouth every 8 (eight) hours as needed.  . risperiDONE (RISPERDAL) 2 MG tablet Take 2 mg by mouth daily.  . rosuvastatin (CRESTOR) 10 MG tablet Take 1 tablet (10 mg total) by mouth daily.  . varenicline (CHANTIX CONTINUING MONTH PAK) 1 MG tablet Take 1 tablet (1 mg total) by mouth 2 (two) times daily.  . VENTOLIN HFA 108 (90 Base) MCG/ACT inhaler INHALE 2 PUFFS EVERY 4 TO 6 HOURS AS NEEDED  . [DISCONTINUED] nicotine (NICOTROL) 10 MG inhaler 6-16 catridges/day inhaled x 6-12 weeks, then taper dose over 6-12 week to discontinue smoking  . [DISCONTINUED] predniSONE (DELTASONE) 10 MG tablet Take 1 tablet (10 mg total) by mouth daily. 6,5,4,3,2,1 six day taper   No facility-administered encounter  medications on file as of 12/19/2017.     Assessment and Plan:  Compliance/adherance: pt knows names/doses/freq/indications of all medications with very little prompting. Pt states she does not miss doses of risperidone and citalopram. She does miss doses of her Advair. Last fill for rosuvastatin was May 2019. Her last fill for Chantix was 11/05/17.  Bipolar/Anxiety: pt is well controlled on combination of risperidone and citalopram. She states that she has been able to keep her job for over 1 year and manages very well on this combination   Smoking cessation: pt is motivated to quit.She wants to quit but has trouble when she is inconsistent with taking the Chantix. Suggested pt think of taking Chantix likes she thinks of taking  her bipolar meds. That she needs it to stop smoking and for her health. Pt was accepting and positive to this suggestion. Pt was also willing to return in 89mos for f/u visit for assessment and encouragement with smoking cessation and diet/exercise.  Asthma/COPD: using rescue daily, using Advair. Pt aware that she needs to take the Advair 2x/daily consistently to decrease use of rescue inhaler. Set goal with pt to increase Advair compliance. Pt admits she feels better when she takes it as prescribed.   RTC: 3 mos - f/u on smoking cessation  Netta Neat, PharmD, [facility]) [phone]

## 2017-12-26 ENCOUNTER — Telehealth: Payer: Self-pay | Admitting: Pharmacist

## 2017-12-26 NOTE — Telephone Encounter (Signed)
12/26/2017 2:07:16 PM - Chantix letter to provider  12/26/17 According to [facility] for patient to continue to receive Chantix, they will need a script and letter of medical necessity from provider. I have typed letter and will take to [facility] for Dr. Rockey Situ to sign for patient to continue Chantix, also printed script and provider page of application.[name]

## 2018-02-14 ENCOUNTER — Telehealth: Payer: Self-pay | Admitting: Pharmacist

## 2018-02-14 NOTE — Telephone Encounter (Signed)
02/14/2018 11:07:40 AM - refills-Advair & Ventolin  02/14/18 Placed refills for Advair 500/50 & Ventolin HFA online with [facility], to release 03/22/18, order# [id number].[name]

## 2018-03-05 ENCOUNTER — Other Ambulatory Visit: Payer: Self-pay | Admitting: Internal Medicine

## 2018-03-10 ENCOUNTER — Emergency Department: Payer: Self-pay

## 2018-03-10 ENCOUNTER — Emergency Department
Admission: EM | Admit: 2018-03-10 | Discharge: 2018-03-10 | Disposition: A | Discharge status: Home / Self Care | Payer: Self-pay | Attending: Emergency Medicine | Admitting: Emergency Medicine

## 2018-03-10 ENCOUNTER — Encounter: Payer: Self-pay | Admitting: Emergency Medicine

## 2018-03-10 ENCOUNTER — Other Ambulatory Visit: Payer: Self-pay

## 2018-03-10 DIAGNOSIS — J219 Acute bronchiolitis, unspecified: Secondary | ICD-10-CM | POA: Insufficient documentation

## 2018-03-10 DIAGNOSIS — J45909 Unspecified asthma, uncomplicated: Secondary | ICD-10-CM | POA: Insufficient documentation

## 2018-03-10 DIAGNOSIS — Z7982 Long term (current) use of aspirin: Secondary | ICD-10-CM | POA: Insufficient documentation

## 2018-03-10 DIAGNOSIS — Z79899 Other long term (current) drug therapy: Secondary | ICD-10-CM | POA: Insufficient documentation

## 2018-03-10 DIAGNOSIS — J449 Chronic obstructive pulmonary disease, unspecified: Secondary | ICD-10-CM | POA: Insufficient documentation

## 2018-03-10 DIAGNOSIS — I2510 Atherosclerotic heart disease of native coronary artery without angina pectoris: Secondary | ICD-10-CM | POA: Insufficient documentation

## 2018-03-10 DIAGNOSIS — F17210 Nicotine dependence, cigarettes, uncomplicated: Secondary | ICD-10-CM | POA: Insufficient documentation

## 2018-03-10 LAB — BASIC METABOLIC PANEL
Anion gap: 6 (ref 5–15)
BUN: 16 mg/dL (ref 6–20)
CO2: 26 mmol/L (ref 22–32)
Calcium: 9.3 mg/dL (ref 8.9–10.3)
Chloride: 107 mmol/L (ref 98–111)
Creatinine, Ser: 0.94 mg/dL (ref 0.44–1.00)
GFR calc Af Amer: >60 mL/min (ref >60)
GFR calc non Af Amer: >60 mL/min (ref >60)
Glucose, Bld: 116 mg/dL — ABNORMAL HIGH (ref 70–99)
Potassium: 4.1 mmol/L (ref 3.5–5.1)
Sodium: 139 mmol/L (ref 135–145)

## 2018-03-10 LAB — CBC
HCT: 41.7 % (ref 36.0–46.0)
Hemoglobin: 13.3 g/dL (ref 12.0–15.0)
MCH: 29 pg (ref 26.0–34.0)
MCHC: 31.9 g/dL (ref 30.0–36.0)
MCV: 90.8 fL (ref 80.0–100.0)
Platelets: 364 10*3/uL (ref 150–400)
RBC: 4.59 MIL/uL (ref 3.87–5.11)
RDW: 12.8 % (ref 11.5–15.5)
WBC: 7.5 10*3/uL (ref 4.0–10.5)
nRBC: 0 % (ref 0.0–0.2)

## 2018-03-10 LAB — INFLUENZA PANEL BY PCR (TYPE A & B)
Influenza A By PCR: NEGATIVE
Influenza B By PCR: NEGATIVE

## 2018-03-10 LAB — TROPONIN I: Troponin I: <0.03 ng/mL (ref <0.03)

## 2018-03-10 MED ORDER — IPRATROPIUM-ALBUTEROL 0.5-2.5 (3) MG/3ML IN SOLN
3.0000 mL | Freq: Once | RESPIRATORY_TRACT | Status: AC
  Administered 2018-03-10: 3 mL via RESPIRATORY_TRACT
  Filled 2018-03-10: qty 3

## 2018-03-10 MED ORDER — PREDNISONE 20 MG PO TABS: 60.0000 mg | ORAL_TABLET | Freq: Every day | ORAL | 0 refills | Status: DC

## 2018-03-10 MED ORDER — METHYLPREDNISOLONE SODIUM SUCC 125 MG IJ SOLR
125.0000 mg | Freq: Once | INTRAMUSCULAR | Status: AC
  Administered 2018-03-10: 125 mg via INTRAVENOUS
  Filled 2018-03-10: qty 2

## 2018-03-10 NOTE — ED Notes (Signed)
Pt states "I've been short of breath since I got over the flu." states that was Tuesday. Speaking in complete sentences. Hx asthma. On room air. No distress noted. Family at bedside. Moving all extremities on own.

## 2018-03-10 NOTE — ED Provider Notes (Signed)
[facility] Emergency Department Provider Note ____________________________________________   First MD Initiated Contact with Patient 03/10/18 0805     (approximate)  I have reviewed the triage vital signs and the nursing notes.   HISTORY  Chief Complaint Shortness of Breath    HPI Erica Gomez is a 53 y.o. female with PMH as noted below including asthma and COPD who presents with shortness of breath over the last several days, gradual onset, worsening course, associated with nonproductive cough.  The patient states that last week she had flulike symptoms with malaise, vomiting, and diarrhea, and then the shortness of breath started after that.  She states that it has not been helped with her normal inhalers at home.   Past Medical History:  Diagnosis Date  . Allergy   . Anxiety   . Asthma   . Bipolar affective, manic ([place]) 1996  . COPD (chronic obstructive pulmonary disease) ([place])   . Depression   . Lung nodule, multiple 09/26/2016  . Manic depressive illness ([place]) 1996  . Seizures ([place])   . Substance abuse ([place])   . Tobacco abuse 10/04/2016    Patient Active Problem List   Diagnosis Date Noted  . CAD (coronary artery disease), native coronary artery 02/23/2017  . Chest pain 02/22/2017  . Chest pain with moderate risk for cardiac etiology 02/22/2017  . Tobacco abuse 10/04/2016  . Lung nodule, multiple 09/26/2016  . Diabetes ([place]) 04/03/2014  . COPD (chronic obstructive pulmonary disease) ([place]) 12/17/2012  . Seizure ([place]) 12/17/2012    Past Surgical History:  Procedure Laterality Date  . ABDOMINAL HYSTERECTOMY    . TUBAL LIGATION      Prior to Admission medications   Medication Sig Start Date End Date Taking? Authorizing Provider  ADVAIR DISKUS 500-50 MCG/DOSE AEPB INHALE 1 PUFF 2 TIMES A DAY. RINSE MOUTH AND SPIT AFTER EACH USE. 10/23/17   Tawni Millers, MD  aspirin EC 81 MG tablet Take 1 tablet (81 mg total) by mouth daily. No  ibuprofen for at least one hour 09/26/16   Arnetha Courser, MD  citalopram (CELEXA) 20 MG tablet Take 20 mg by mouth daily.    [provider]  ibuprofen (ADVIL,MOTRIN) 800 MG tablet Take 800 mg by mouth every 8 (eight) hours as needed.    [provider]  risperiDONE (RISPERDAL) 2 MG tablet Take 2 mg by mouth daily.    [provider]  rosuvastatin (CRESTOR) 10 MG tablet Take 1 tablet (10 mg total) by mouth daily. 02/23/17   Minna Merritts, MD  varenicline (CHANTIX CONTINUING MONTH PAK) 1 MG tablet Take 1 tablet (1 mg total) by mouth 2 (two) times daily. 02/23/17   Minna Merritts, MD  VENTOLIN HFA 108 (90 Base) MCG/ACT inhaler INHALE 2 PUFFS EVERY 4 TO 6 HOURS AS NEEDED 09/10/17   Tawni Millers, MD    Allergies Flax seed oil [bio-flax]  Family History  Problem Relation Age of Onset  . Stroke Mother   . Heart attack Father     Social History Social History   Tobacco Use  . Smoking status: Current Every Day Smoker    Packs/day: 1.00    Years: 36.00    Pack years: 36.00    Types: Cigarettes  . Smokeless tobacco: Current User    Types: Chew, Snuff  . Tobacco comment: Using chew to wean herself off cigarettes  Substance Use Topics  . Alcohol use: No    Comment: history  .  Drug use: No    Types: "Crack" cocaine    Comment: history 2013    Review of Systems  Constitutional: No fever. Eyes: No redness. ENT: Positive for nasal congestion. Cardiovascular: Denies chest pain. Respiratory: Positive for shortness of breath. Gastrointestinal: No vomiting.  Genitourinary: Negative for flank pain.  Musculoskeletal: Negative for back pain. Skin: Negative for rash. Neurological: Negative for headache.   ____________________________________________   PHYSICAL EXAM:  VITAL SIGNS: ED Triage Vitals  Enc Vitals Group     BP 03/10/18 0754 131/90     Pulse Rate 03/10/18 0754 80     Resp 03/10/18 0754 (!) 22     Temp 03/10/18 0754 97.6 F (36.4 C)       Temp Source 03/10/18 0754 Oral     SpO2 03/10/18 0754 92 %     Weight 03/10/18 0753 185 lb (83.9 kg)     Height 03/10/18 0753 5\' 6"  (1.676 m)     Head Circumference --      Peak Flow --      Pain Score 03/10/18 0753 0     Pain Loc --      Pain Edu? --      Excl. in [place]? --     Constitutional: Alert and oriented.  Relatively well appearing and in no acute distress. Eyes: Conjunctivae are normal.  Head: Atraumatic. Nose: No congestion/rhinnorhea. Mouth/Throat: Mucous membranes are moist.   Neck: Normal range of motion.  Cardiovascular: Normal rate, regular rhythm. Grossly normal heart sounds.  Good peripheral circulation. Respiratory: Normal respiratory effort.  No retractions.  Decreased breath sounds bilaterally with wheezing.. Gastrointestinal: No distention.  Musculoskeletal: No lower extremity edema.  Extremities warm and well perfused.  Neurologic:  Normal speech and language. No gross focal neurologic deficits are appreciated.  Skin:  Skin is warm and dry. No rash noted. Psychiatric: Mood and affect are normal. Speech and behavior are normal.  ____________________________________________   LABS (all labs ordered are listed, but only abnormal results are displayed)  Labs Reviewed  CBC  BASIC METABOLIC PANEL  TROPONIN I   ____________________________________________  EKG  ED ECG REPORT I, Arta Silence, the attending physician, personally viewed and interpreted this ECG.  Date: 03/10/2018 EKG Time: 758 Rate: 76 Rhythm: normal sinus rhythm QRS Axis: normal Intervals: RBBB ST/T Wave abnormalities: normal Narrative Interpretation: no evidence of acute ischemia  ____________________________________________  RADIOLOGY  CXR: No focal infiltrate  ____________________________________________   PROCEDURES  Procedure(s) performed: No  Procedures  Critical Care performed: No ____________________________________________   INITIAL IMPRESSION /  ASSESSMENT AND PLAN / ED COURSE  Pertinent labs & imaging results that were available during my care of the patient were reviewed by me and considered in my medical decision making (see chart for details).  53 year old female with a history of asthma, COPD, and other PMH as noted above presents with shortness of breath over the last several days, occurring after she had viral type symptoms with vomiting and diarrhea last week.  On exam the patient is overall comfortable appearing.  Her vital signs are normal except for borderline low O2 saturation.  She has no acute respiratory distress.  She has decreased breath sounds bilaterally with some scattered wheezing.  The remainder of the exam is as described above.  Differential includes asthma/COPD exacerbation, viral bronchitis, influenza, or pneumonia.  We will obtain chest x-ray, lab work-up, give nebs and steroid, and reassess.  ----------------------------------------- 11:13 AM on 03/10/2018 -----------------------------------------  Chest x-ray and flu are negative.  The patient's lab work-up is unremarkable.  She is stable for discharge home at this time.  She is feeling better after the nebulizer and steroid.  I counseled her on the results of the work-up.  Prednisone is been prescribed and she will use her normal inhaler over the next few days.  Return precautions given, and she expresses understanding. ____________________________________________   FINAL CLINICAL IMPRESSION(S) / ED DIAGNOSES  Final diagnoses:  None      NEW MEDICATIONS STARTED DURING THIS VISIT:  New Prescriptions   No medications on file     Note:  This document was prepared using Dragon voice recognition software and may include unintentional dictation errors.    Arta Silence, MD 03/10/18 1113

## 2018-03-10 NOTE — ED Triage Notes (Signed)
Pt to ED via POV c/o shortness of breath that started on Tuesday and has progressively gotten worse. Pt has been using inhaler without relief. Pt states that she has productive cough as well. Pt has hx/o asthma.

## 2018-03-10 NOTE — Discharge Instructions (Addendum)
Take the prednisone as prescribed starting tomorrow and finish the full 4-day course.  You should use your albuterol inhaler, 2 puffs every 4-6 hours for the next several days.  Return to the ER for new, worsening, or persistent shortness of breath, cough, fever, weakness, or any other new or worsening symptoms that concern you.

## 2018-03-10 NOTE — ED Notes (Signed)
Pt in xray

## 2018-03-11 ENCOUNTER — Emergency Department: Payer: Self-pay

## 2018-03-11 ENCOUNTER — Other Ambulatory Visit: Payer: Self-pay

## 2018-03-11 ENCOUNTER — Inpatient Hospital Stay
Admission: EM | Admit: 2018-03-11 | Discharge: 2018-03-14 | DRG: 190 | Disposition: A | Discharge status: Home / Self Care | Payer: Self-pay | Attending: Internal Medicine | Admitting: Internal Medicine

## 2018-03-11 ENCOUNTER — Other Ambulatory Visit: Payer: Self-pay | Admitting: Internal Medicine

## 2018-03-11 DIAGNOSIS — E119 Type 2 diabetes mellitus without complications: Secondary | ICD-10-CM

## 2018-03-11 DIAGNOSIS — E1165 Type 2 diabetes mellitus with hyperglycemia: Secondary | ICD-10-CM | POA: Diagnosis not present

## 2018-03-11 DIAGNOSIS — Z79899 Other long term (current) drug therapy: Secondary | ICD-10-CM

## 2018-03-11 DIAGNOSIS — Z7982 Long term (current) use of aspirin: Secondary | ICD-10-CM

## 2018-03-11 DIAGNOSIS — Y92230 Patient room in hospital as the place of occurrence of the external cause: Secondary | ICD-10-CM | POA: Diagnosis not present

## 2018-03-11 DIAGNOSIS — J441 Chronic obstructive pulmonary disease with (acute) exacerbation: Principal | ICD-10-CM

## 2018-03-11 DIAGNOSIS — F319 Bipolar disorder, unspecified: Secondary | ICD-10-CM | POA: Diagnosis present

## 2018-03-11 DIAGNOSIS — F17220 Nicotine dependence, chewing tobacco, uncomplicated: Secondary | ICD-10-CM | POA: Diagnosis present

## 2018-03-11 DIAGNOSIS — J9601 Acute respiratory failure with hypoxia: Secondary | ICD-10-CM

## 2018-03-11 DIAGNOSIS — Z91018 Allergy to other foods: Secondary | ICD-10-CM

## 2018-03-11 DIAGNOSIS — T380X5A Adverse effect of glucocorticoids and synthetic analogues, initial encounter: Secondary | ICD-10-CM | POA: Diagnosis not present

## 2018-03-11 DIAGNOSIS — I2510 Atherosclerotic heart disease of native coronary artery without angina pectoris: Secondary | ICD-10-CM | POA: Diagnosis present

## 2018-03-11 DIAGNOSIS — Z90710 Acquired absence of both cervix and uterus: Secondary | ICD-10-CM

## 2018-03-11 DIAGNOSIS — F419 Anxiety disorder, unspecified: Secondary | ICD-10-CM | POA: Diagnosis present

## 2018-03-11 DIAGNOSIS — I4581 Long QT syndrome: Secondary | ICD-10-CM | POA: Diagnosis present

## 2018-03-11 DIAGNOSIS — E785 Hyperlipidemia, unspecified: Secondary | ICD-10-CM | POA: Diagnosis present

## 2018-03-11 DIAGNOSIS — Z8249 Family history of ischemic heart disease and other diseases of the circulatory system: Secondary | ICD-10-CM

## 2018-03-11 DIAGNOSIS — Z716 Tobacco abuse counseling: Secondary | ICD-10-CM

## 2018-03-11 DIAGNOSIS — Z7951 Long term (current) use of inhaled steroids: Secondary | ICD-10-CM

## 2018-03-11 DIAGNOSIS — F17210 Nicotine dependence, cigarettes, uncomplicated: Secondary | ICD-10-CM | POA: Diagnosis present

## 2018-03-11 DIAGNOSIS — I4510 Unspecified right bundle-branch block: Secondary | ICD-10-CM | POA: Diagnosis present

## 2018-03-11 DIAGNOSIS — Z823 Family history of stroke: Secondary | ICD-10-CM

## 2018-03-11 DIAGNOSIS — Z23 Encounter for immunization: Secondary | ICD-10-CM

## 2018-03-11 HISTORY — DX: Hyperlipidemia, unspecified: E78.5

## 2018-03-11 LAB — BASIC METABOLIC PANEL
Anion gap: 9 (ref 5–15)
BUN: 15 mg/dL (ref 6–20)
CO2: 24 mmol/L (ref 22–32)
Calcium: 9.5 mg/dL (ref 8.9–10.3)
Chloride: 104 mmol/L (ref 98–111)
Creatinine, Ser: 0.89 mg/dL (ref 0.44–1.00)
GFR calc Af Amer: >60 mL/min (ref >60)
GFR calc non Af Amer: >60 mL/min (ref >60)
Glucose, Bld: 126 mg/dL — ABNORMAL HIGH (ref 70–99)
Potassium: 3.7 mmol/L (ref 3.5–5.1)
Sodium: 137 mmol/L (ref 135–145)

## 2018-03-11 LAB — CBC WITH DIFFERENTIAL/PLATELET
Abs Immature Granulocytes: 0.11 10*3/uL — ABNORMAL HIGH (ref 0.00–0.07)
Basophils Absolute: 0 10*3/uL (ref 0.0–0.1)
Basophils Relative: 0 %
Eosinophils Absolute: 0 10*3/uL (ref 0.0–0.5)
Eosinophils Relative: 0 %
HCT: 41 % (ref 36.0–46.0)
Hemoglobin: 13.2 g/dL (ref 12.0–15.0)
Immature Granulocytes: 1 %
Lymphocytes Relative: 18 %
Lymphs Abs: 3.2 10*3/uL (ref 0.7–4.0)
MCH: 29.7 pg (ref 26.0–34.0)
MCHC: 32.2 g/dL (ref 30.0–36.0)
MCV: 92.1 fL (ref 80.0–100.0)
Monocytes Absolute: 0.7 10*3/uL (ref 0.1–1.0)
Monocytes Relative: 4 %
Neutro Abs: 13.4 10*3/uL — ABNORMAL HIGH (ref 1.7–7.7)
Neutrophils Relative %: 77 %
Platelets: 395 10*3/uL (ref 150–400)
RBC: 4.45 MIL/uL (ref 3.87–5.11)
RDW: 12.9 % (ref 11.5–15.5)
WBC: 17.4 10*3/uL — ABNORMAL HIGH (ref 4.0–10.5)
nRBC: 0 % (ref 0.0–0.2)

## 2018-03-11 LAB — TROPONIN I: Troponin I: <0.03 ng/mL (ref <0.03)

## 2018-03-11 MED ORDER — IPRATROPIUM-ALBUTEROL 0.5-2.5 (3) MG/3ML IN SOLN
3.0000 mL | Freq: Once | RESPIRATORY_TRACT | Status: AC
  Administered 2018-03-11: 3 mL via RESPIRATORY_TRACT

## 2018-03-11 MED ORDER — DOXYCYCLINE HYCLATE 100 MG PO TABS
100.0000 mg | ORAL_TABLET | Freq: Once | ORAL | Status: AC
  Administered 2018-03-11: 100 mg via ORAL
  Filled 2018-03-11: qty 1

## 2018-03-11 MED ORDER — METHYLPREDNISOLONE SODIUM SUCC 125 MG IJ SOLR
125.0000 mg | Freq: Once | INTRAMUSCULAR | Status: AC
  Administered 2018-03-11: 125 mg via INTRAVENOUS
  Filled 2018-03-11: qty 2

## 2018-03-11 NOTE — ED Notes (Signed)
Report given to Butch RN 

## 2018-03-11 NOTE — ED Notes (Signed)
Pt placed on 2L nasal cannula at this time. Pt standing and O2 at 85-86% RA. Pt advised to take deep breaths and pt still at 86%.

## 2018-03-11 NOTE — ED Provider Notes (Signed)
[facility] Emergency Department Provider Note  ____________________________________________  Time seen: Approximately 8:38 PM  I have reviewed the triage vital signs and the nursing notes.   HISTORY  Chief Complaint Shortness of Breath   HPI Erica Gomez is a 53 y.o. female with a history of smoking, COPD, diabetes who presents for evaluation of wheezing and shortness of breath.  Patient reports a week of a productive cough, dyspnea on exertion and wheezing.  She was seen here yesterday with negative chest x-ray, labs and flu swab.  She was started on prednisone.  She reports that her symptoms have gotten worse.  She reports more prominent dyspnea with exertion and wheezing.  She has been using her inhalers.  No fever or chills.  No personal or family history of blood clots, recent travel immobilization, leg pain or swelling, hemoptysis, or exogenous hormones.  No chest pain.  Patient continues to smoke.   Past Medical History:  Diagnosis Date  . Allergy   . Anxiety   . Asthma   . Bipolar affective, manic ([place]) 1996  . COPD (chronic obstructive pulmonary disease) ([place])   . Depression   . Lung nodule, multiple 09/26/2016  . Manic depressive illness ([place]) 1996  . Seizures ([place])   . Substance abuse ([place])   . Tobacco abuse 10/04/2016    Patient Active Problem List   Diagnosis Date Noted  . CAD (coronary artery disease), native coronary artery 02/23/2017  . Chest pain 02/22/2017  . Chest pain with moderate risk for cardiac etiology 02/22/2017  . Tobacco abuse 10/04/2016  . Lung nodule, multiple 09/26/2016  . Diabetes ([place]) 04/03/2014  . COPD (chronic obstructive pulmonary disease) ([place]) 12/17/2012  . Seizure ([place]) 12/17/2012    Past Surgical History:  Procedure Laterality Date  . ABDOMINAL HYSTERECTOMY    . TUBAL LIGATION      Prior to Admission medications   Medication Sig Start Date End Date Taking? Authorizing Provider  ADVAIR DISKUS  500-50 MCG/DOSE AEPB INHALE 1 PUFF 2 TIMES A DAY. RINSE MOUTH AND SPIT AFTER EACH USE. 10/23/17   Tawni Millers, MD  aspirin EC 81 MG tablet Take 1 tablet (81 mg total) by mouth daily. No ibuprofen for at least one hour 09/26/16   Arnetha Courser, MD  citalopram (CELEXA) 20 MG tablet Take 20 mg by mouth daily.    [provider]  ibuprofen (ADVIL,MOTRIN) 800 MG tablet Take 800 mg by mouth every 8 (eight) hours as needed.    [provider]  predniSONE (DELTASONE) 20 MG tablet Take 3 tablets (60 mg total) by mouth daily with breakfast for 4 days. 03/11/18 03/15/18  Arta Silence, MD  risperiDONE (RISPERDAL) 2 MG tablet Take 2 mg by mouth daily.    [provider]  rosuvastatin (CRESTOR) 10 MG tablet Take 1 tablet (10 mg total) by mouth daily. 02/23/17   Minna Merritts, MD  varenicline (CHANTIX CONTINUING MONTH PAK) 1 MG tablet Take 1 tablet (1 mg total) by mouth 2 (two) times daily. 02/23/17   Minna Merritts, MD  VENTOLIN HFA 108 (90 Base) MCG/ACT inhaler INHALE 2 PUFFS EVERY 4 TO 6 HOURS AS NEEDED 09/10/17   Tawni Millers, MD    Allergies Flax seed oil [bio-flax]  Family History  Problem Relation Age of Onset  . Stroke Mother   . Heart attack Father     Social History Social History   Tobacco Use  . Smoking status: Current Every Day  Smoker    Packs/day: 1.00    Years: 36.00    Pack years: 36.00    Types: Cigarettes  . Smokeless tobacco: Current User    Types: Chew, Snuff  . Tobacco comment: Using chew to wean herself off cigarettes  Substance Use Topics  . Alcohol use: No    Comment: history  . Drug use: No    Types: "Crack" cocaine    Comment: history 2013    Review of Systems  Constitutional: Negative for fever. Eyes: Negative for visual changes. ENT: Negative for sore throat. Neck: No neck pain  Cardiovascular: Negative for chest pain. Respiratory: + shortness of breath, wheezing, and cough Gastrointestinal: Negative for abdominal  pain, vomiting or diarrhea. Genitourinary: Negative for dysuria. Musculoskeletal: Negative for back pain. Skin: Negative for rash. Neurological: Negative for headaches, weakness or numbness. Psych: No SI or HI  ____________________________________________   PHYSICAL EXAM:  VITAL SIGNS: ED Triage Vitals  Enc Vitals Group     BP 03/11/18 1718 130/85     Pulse Rate 03/11/18 1718 93     Resp 03/11/18 1718 (!) 24     Temp 03/11/18 1718 98.3 F (36.8 C)     Temp Source 03/11/18 1718 Oral     SpO2 03/11/18 1718 91 %     Weight 03/11/18 1719 189 lb (85.7 kg)     Height 03/11/18 1719 5\' 6"  (1.676 m)     Head Circumference --      Peak Flow --      Pain Score 03/11/18 1731 7     Pain Loc --      Pain Edu? --      Excl. in [place]? --     Constitutional: Alert and oriented. Well appearing and in no apparent distress. HEENT:      Head: Normocephalic and atraumatic.         Eyes: Conjunctivae are normal. Sclera is non-icteric.       Mouth/Throat: Mucous membranes are moist.       Neck: Supple with no signs of meningismus. Cardiovascular: Regular rate and rhythm. No murmurs, gallops, or rubs. 2+ symmetrical distal pulses are present in all extremities. No JVD. Respiratory: Decreased air movement bilaterally with diffuse expiratory wheezes, tachypnea, no hypoxia  gastrointestinal: Soft, non tender, and non distended with positive bowel sounds. No rebound or guarding. Musculoskeletal: Nontender with normal range of motion in all extremities. No edema, cyanosis, or erythema of extremities. Neurologic: Normal speech and language. Face is symmetric. Moving all extremities. No gross focal neurologic deficits are appreciated. Skin: Skin is warm, dry and intact. No rash noted. Psychiatric: Mood and affect are normal. Speech and behavior are normal.  ____________________________________________   LABS (all labs ordered are listed, but only abnormal results are displayed)  Labs Reviewed  CBC  WITH DIFFERENTIAL/PLATELET  TROPONIN I  BASIC METABOLIC PANEL   ____________________________________________  EKG  ED ECG REPORT I, Rudene Re, the attending physician, personally viewed and interpreted this ECG.  Normal sinus rhythm, rate of 99, right bundle branch block, prolonged QTC, normal axis, no ST elevations or depressions.  Unchanged from prior. ____________________________________________  RADIOLOGY  I have personally reviewed the images performed during this visit and I agree with the Radiologist's read.   Interpretation by Radiologist:  Dg Chest 2 View  Result Date: 03/11/2018 CLINICAL DATA:  53 year old female with a history of shortness of breath EXAM: CHEST - 2 VIEW COMPARISON:  03/10/2018 FINDINGS: Cardiomediastinal silhouette unchanged in size and contour. No  pneumothorax. No pleural effusion. No confluent airspace disease. Coarsened interstitial markings, similar to the comparison. No displaced fracture. IMPRESSION: Chronic changes without evidence of acute cardiopulmonary disease Electronically Signed   By: Corrie Mckusick D.O.   On: 03/11/2018 21:07      ____________________________________________   PROCEDURES  Procedure(s) performed: None Procedures Critical Care performed: yes  CRITICAL CARE Performed by: Rudene Re  ?  Total critical care time: 35 min  Critical care time was exclusive of separately billable procedures and treating other patients.  Critical care was necessary to treat or prevent imminent or life-threatening deterioration.  Critical care was time spent personally by me on the following activities: development of treatment plan with patient and/or surrogate as well as nursing, discussions with consultants, evaluation of patient's response to treatment, examination of patient, obtaining history from patient or surrogate, ordering and performing treatments and interventions, ordering and review of laboratory studies,  ordering and review of radiographic studies, pulse oximetry and re-evaluation of patient's condition.  ____________________________________________   INITIAL IMPRESSION / ASSESSMENT AND PLAN / ED COURSE   53 y.o. female with a history of smoking, COPD, diabetes who presents for evaluation of wheezing, cough, and shortness of breath x 1 week. Patient is extremely well appearing, mildly increased work of breathing, no hypoxia, severely diminished air movement with diffuse expiratory wheezes throughout.  Patient seen here yesterday with negative flu, normal chest x-ray, unchanged EKG, normal troponin, no signs of sepsis on lab work.  No need for repeat lab work at this time.  Will repeat a chest x-ray to rule out developing pneumonia.  Will treat patient with duo nebs and steroids.  Will include doxycycline for bronchitis/COPD exacerbation.    _________________________ 10:34 PM on 03/11/2018 -----------------------------------------  Chest x-ray negative for pneumonia.  After 3 DuoNeb's patient became hypoxic and tachycardic just with standing.  She was placed on 2 L nasal cannula with resolution of her hypoxia.  Will check basic labs and admit to the hospitalist for acute respiratory failure.   As part of my medical decision making, I reviewed the following data within the [facility] notes reviewed and incorporated, Labs reviewed , EKG interpreted , Old EKG reviewed, Old chart reviewed, Radiograph reviewed , Discussed with admitting physician , Notes from prior ED visits and [place] Controlled Substance Database    Pertinent labs & imaging results that were available during my care of the patient were reviewed by me and considered in my medical decision making (see chart for details).    ____________________________________________   FINAL CLINICAL IMPRESSION(S) / ED DIAGNOSES  Final diagnoses:  Acute respiratory failure with hypoxia (HCC)  COPD exacerbation (HCC)       NEW MEDICATIONS STARTED DURING THIS VISIT:  ED Discharge Orders    None       Note:  This document was prepared using Dragon voice recognition software and may include unintentional dictation errors.    Alfred Levins, [place], MD 03/11/18 [phone]

## 2018-03-11 NOTE — ED Notes (Signed)
Pt given meal tray and drink at this time 

## 2018-03-11 NOTE — ED Triage Notes (Signed)
Pt c/o increased SOB over the past week, was seen here yesterday and dx with bronchitis and given Rx for steroids, states she has been taking them and inhaler but it is getting worse, not able to walk more then a couple feet without getting out of breathing,

## 2018-03-12 ENCOUNTER — Encounter: Payer: Self-pay | Admitting: Internal Medicine

## 2018-03-12 DIAGNOSIS — F419 Anxiety disorder, unspecified: Secondary | ICD-10-CM | POA: Diagnosis present

## 2018-03-12 DIAGNOSIS — J441 Chronic obstructive pulmonary disease with (acute) exacerbation: Secondary | ICD-10-CM | POA: Diagnosis present

## 2018-03-12 DIAGNOSIS — E785 Hyperlipidemia, unspecified: Secondary | ICD-10-CM

## 2018-03-12 HISTORY — DX: Hyperlipidemia, unspecified: E78.5

## 2018-03-12 LAB — BASIC METABOLIC PANEL
Anion gap: 5 (ref 5–15)
BUN: 17 mg/dL (ref 6–20)
CO2: 25 mmol/L (ref 22–32)
Calcium: 9.6 mg/dL (ref 8.9–10.3)
Chloride: 110 mmol/L (ref 98–111)
Creatinine, Ser: 1.01 mg/dL — ABNORMAL HIGH (ref 0.44–1.00)
GFR calc Af Amer: >60 mL/min (ref >60)
GFR calc non Af Amer: >60 mL/min (ref >60)
Glucose, Bld: 268 mg/dL — ABNORMAL HIGH (ref 70–99)
Potassium: 4.3 mmol/L (ref 3.5–5.1)
Sodium: 140 mmol/L (ref 135–145)

## 2018-03-12 LAB — CBC
HCT: 39.4 % (ref 36.0–46.0)
Hemoglobin: 12.6 g/dL (ref 12.0–15.0)
MCH: 29.7 pg (ref 26.0–34.0)
MCHC: 32 g/dL (ref 30.0–36.0)
MCV: 92.9 fL (ref 80.0–100.0)
Platelets: 400 10*3/uL (ref 150–400)
RBC: 4.24 MIL/uL (ref 3.87–5.11)
RDW: 13.2 % (ref 11.5–15.5)
WBC: 16.7 10*3/uL — ABNORMAL HIGH (ref 4.0–10.5)
nRBC: 0 % (ref 0.0–0.2)

## 2018-03-12 LAB — GLUCOSE, CAPILLARY
Glucose-Capillary: 162 mg/dL — ABNORMAL HIGH (ref 70–99)
Glucose-Capillary: 131 mg/dL — ABNORMAL HIGH (ref 70–99)
Glucose-Capillary: 145 mg/dL — ABNORMAL HIGH (ref 70–99)
Glucose-Capillary: 226 mg/dL — ABNORMAL HIGH (ref 70–99)

## 2018-03-12 MED ORDER — BENZONATATE 100 MG PO CAPS: 200.0000 mg | ORAL_CAPSULE | Freq: Three times a day (TID) | ORAL | Status: DC | PRN

## 2018-03-12 MED ORDER — ASPIRIN EC 81 MG PO TBEC
81.0000 mg | DELAYED_RELEASE_TABLET | Freq: Every day | ORAL | Status: DC
  Administered 2018-03-12 – 2018-03-14 (×3): 81 mg via ORAL
  Filled 2018-03-12 (×3): qty 1

## 2018-03-12 MED ORDER — ONDANSETRON HCL 4 MG PO TABS: 4.0000 mg | ORAL_TABLET | Freq: Four times a day (QID) | ORAL | Status: DC | PRN

## 2018-03-12 MED ORDER — MOMETASONE FURO-FORMOTEROL FUM 200-5 MCG/ACT IN AERO
2.0000 | INHALATION_SPRAY | Freq: Two times a day (BID) | RESPIRATORY_TRACT | Status: DC
  Filled 2018-03-12 (×2): qty 8.8

## 2018-03-12 MED ORDER — IPRATROPIUM-ALBUTEROL 0.5-2.5 (3) MG/3ML IN SOLN: 3.0000 mL | RESPIRATORY_TRACT | Status: DC | PRN

## 2018-03-12 MED ORDER — GUAIFENESIN-DM 100-10 MG/5ML PO SYRP
5.0000 mL | ORAL_SOLUTION | ORAL | Status: DC | PRN
  Administered 2018-03-12 – 2018-03-13 (×2): 5 mL via ORAL
  Filled 2018-03-12 (×3): qty 5

## 2018-03-12 MED ORDER — ACETAMINOPHEN 325 MG PO TABS: 650.0000 mg | ORAL_TABLET | Freq: Four times a day (QID) | ORAL | Status: DC | PRN

## 2018-03-12 MED ORDER — RISPERIDONE 1 MG PO TABS
2.0000 mg | ORAL_TABLET | Freq: Every day | ORAL | Status: DC
  Administered 2018-03-12 – 2018-03-13 (×2): 2 mg via ORAL
  Filled 2018-03-12 (×3): qty 2

## 2018-03-12 MED ORDER — ENOXAPARIN SODIUM 40 MG/0.4ML ~~LOC~~ SOLN
40.0000 mg | SUBCUTANEOUS | Status: DC
  Administered 2018-03-12 – 2018-03-14 (×3): 40 mg via SUBCUTANEOUS
  Filled 2018-03-12 (×3): qty 0.4

## 2018-03-12 MED ORDER — BUDESONIDE 0.25 MG/2ML IN SUSP
0.2500 mg | Freq: Two times a day (BID) | RESPIRATORY_TRACT | Status: DC
  Administered 2018-03-12 – 2018-03-14 (×4): 0.25 mg via RESPIRATORY_TRACT
  Filled 2018-03-12 (×3): qty 2

## 2018-03-12 MED ORDER — INSULIN ASPART 100 UNIT/ML ~~LOC~~ SOLN
0.0000 [IU] | Freq: Three times a day (TID) | SUBCUTANEOUS | Status: DC
  Administered 2018-03-12: 1 [IU] via SUBCUTANEOUS
  Administered 2018-03-12: 09:00:00 2 [IU] via SUBCUTANEOUS
  Administered 2018-03-12: 3 [IU] via SUBCUTANEOUS
  Administered 2018-03-12: 1 [IU] via SUBCUTANEOUS
  Administered 2018-03-13: 21:00:00 2 [IU] via SUBCUTANEOUS
  Administered 2018-03-13: 13:00:00 3 [IU] via SUBCUTANEOUS
  Administered 2018-03-13: 2 [IU] via SUBCUTANEOUS
  Administered 2018-03-13 – 2018-03-14 (×2): 1 [IU] via SUBCUTANEOUS
  Filled 2018-03-12 (×9): qty 1

## 2018-03-12 MED ORDER — CITALOPRAM HYDROBROMIDE 20 MG PO TABS
20.0000 mg | ORAL_TABLET | Freq: Every day | ORAL | Status: DC
  Administered 2018-03-12 – 2018-03-14 (×3): 20 mg via ORAL
  Filled 2018-03-12 (×3): qty 1

## 2018-03-12 MED ORDER — METHYLPREDNISOLONE SODIUM SUCC 125 MG IJ SOLR
60.0000 mg | Freq: Four times a day (QID) | INTRAMUSCULAR | Status: DC
  Administered 2018-03-12 – 2018-03-13 (×6): 60 mg via INTRAVENOUS
  Filled 2018-03-12 (×6): qty 2

## 2018-03-12 MED ORDER — AZITHROMYCIN 500 MG PO TABS
500.0000 mg | ORAL_TABLET | Freq: Every day | ORAL | Status: DC
  Administered 2018-03-12 – 2018-03-14 (×3): 500 mg via ORAL
  Filled 2018-03-12 (×3): qty 1

## 2018-03-12 MED ORDER — IPRATROPIUM-ALBUTEROL 0.5-2.5 (3) MG/3ML IN SOLN
3.0000 mL | Freq: Four times a day (QID) | RESPIRATORY_TRACT | Status: DC
  Administered 2018-03-12 – 2018-03-14 (×8): 3 mL via RESPIRATORY_TRACT
  Filled 2018-03-12 (×8): qty 3

## 2018-03-12 MED ORDER — VARENICLINE TARTRATE 1 MG PO TABS
1.0000 mg | ORAL_TABLET | Freq: Two times a day (BID) | ORAL | Status: DC
  Administered 2018-03-12 – 2018-03-14 (×5): 1 mg via ORAL
  Filled 2018-03-12 (×6): qty 1

## 2018-03-12 MED ORDER — INFLUENZA VAC SPLIT QUAD 0.5 ML IM SUSY
0.5000 mL | PREFILLED_SYRINGE | INTRAMUSCULAR | Status: AC
  Administered 2018-03-14: 0.5 mL via INTRAMUSCULAR
  Filled 2018-03-12: qty 0.5

## 2018-03-12 MED ORDER — ACETAMINOPHEN 650 MG RE SUPP: 650.0000 mg | Freq: Four times a day (QID) | RECTAL | Status: DC | PRN

## 2018-03-12 MED ORDER — ROSUVASTATIN CALCIUM 10 MG PO TABS
10.0000 mg | ORAL_TABLET | Freq: Every day | ORAL | Status: DC
  Administered 2018-03-12 – 2018-03-13 (×2): 10 mg via ORAL
  Filled 2018-03-12 (×2): qty 1

## 2018-03-12 MED ORDER — GUAIFENESIN ER 600 MG PO TB12
600.0000 mg | ORAL_TABLET | Freq: Two times a day (BID) | ORAL | Status: DC
  Administered 2018-03-12 – 2018-03-14 (×5): 600 mg via ORAL
  Filled 2018-03-12 (×5): qty 1

## 2018-03-12 MED ORDER — ORAL CARE MOUTH RINSE
15.0000 mL | Freq: Two times a day (BID) | OROMUCOSAL | Status: DC
  Administered 2018-03-12 – 2018-03-14 (×3): 15 mL via OROMUCOSAL

## 2018-03-12 MED ORDER — ONDANSETRON HCL 4 MG/2ML IJ SOLN: 4.0000 mg | Freq: Four times a day (QID) | INTRAMUSCULAR | Status: DC | PRN

## 2018-03-12 NOTE — Plan of Care (Signed)

## 2018-03-12 NOTE — H&P (Signed)
[facility] [place] NAME: Erica Gomez    MR#:  [id number]  DATE OF BIRTH:  28-Aug-1965  DATE OF ADMISSION:  03/11/2018  PRIMARY CARE PHYSICIAN: Patient, No Pcp Per   REQUESTING/REFERRING PHYSICIAN: Alfred Levins, MD  CHIEF COMPLAINT:   Chief Complaint  Patient presents with  . Shortness of Breath    HISTORY OF PRESENT ILLNESS:  Erica Gomez  is a 53 y.o. female who presents with chief complaint as above.  Patient presents to the ED after about 1 week of progressively increasing shortness of breath.  Patient states she has had a lot of wheezing, especially over the last few days.  She came to the ED and was found here to be hypoxic requiring oxygen, she does not normally wear oxygen at home.  Work-up in the ED is consistent with COPD exacerbation.  Hospitalist were called for admission  PAST MEDICAL HISTORY:   Past Medical History:  Diagnosis Date  . Allergy   . Anxiety   . Asthma   . Bipolar affective, manic ([place]) 1996  . COPD (chronic obstructive pulmonary disease) ([place])   . Depression   . HLD (hyperlipidemia) 03/12/2018  . Lung nodule, multiple 09/26/2016  . Manic depressive illness ([place]) 1996  . Seizures ([place])   . Substance abuse ([place])   . Tobacco abuse 10/04/2016     PAST SURGICAL HISTORY:   Past Surgical History:  Procedure Laterality Date  . ABDOMINAL HYSTERECTOMY    . TUBAL LIGATION       SOCIAL HISTORY:   Social History   Tobacco Use  . Smoking status: Current Every Day Smoker    Packs/day: 1.00    Years: 36.00    Pack years: 36.00    Types: Cigarettes  . Smokeless tobacco: Current User    Types: Chew, Snuff  . Tobacco comment: Using chew to wean herself off cigarettes  Substance Use Topics  . Alcohol use: No    Comment: history     FAMILY HISTORY:   Family History  Problem Relation Age of Onset  . Stroke Mother   . Heart attack Father      DRUG ALLERGIES:   Allergies  Allergen  Reactions  . Flax Seed Oil [Bio-Flax] Hives    Pt. Self reported    MEDICATIONS AT HOME:   Prior to Admission medications   Medication Sig Start Date End Date Taking? Authorizing Provider  ADVAIR DISKUS 500-50 MCG/DOSE AEPB INHALE 1 PUFF 2 TIMES A DAY. RINSE MOUTH AND SPIT AFTER EACH USE. Patient taking differently: Inhale 1 puff into the lungs 2 (two) times daily.  10/23/17  Yes Tawni Millers, MD  aspirin EC 81 MG tablet Take 1 tablet (81 mg total) by mouth daily. No ibuprofen for at least one hour 09/26/16  Yes Lada, Satira Anis, MD  citalopram (CELEXA) 20 MG tablet Take 20 mg by mouth daily.   Yes [provider]  ibuprofen (ADVIL,MOTRIN) 800 MG tablet Take 800 mg by mouth every 8 (eight) hours as needed for fever, headache or mild pain.    Yes [provider]  predniSONE (DELTASONE) 20 MG tablet Take 3 tablets (60 mg total) by mouth daily with breakfast for 4 days. 03/11/18 03/15/18 Yes Arta Silence, MD  risperiDONE (RISPERDAL) 2 MG tablet Take 2 mg by mouth daily.   Yes [provider]  rosuvastatin (CRESTOR) 10 MG tablet Take 1 tablet (10 mg total) by mouth daily. 02/23/17  Yes Gollan,  Kathlene November, MD  varenicline (CHANTIX CONTINUING MONTH PAK) 1 MG tablet Take 1 tablet (1 mg total) by mouth 2 (two) times daily. 02/23/17  Yes Gollan, Kathlene November, MD  VENTOLIN HFA 108 (90 Base) MCG/ACT inhaler INHALE 2 PUFFS EVERY 4 TO 6 HOURS AS NEEDED Patient taking differently: Inhale 2 puffs into the lungs every 4 (four) hours as needed for wheezing or shortness of breath.  09/10/17  Yes Tawni Millers, MD    REVIEW OF SYSTEMS:  Review of Systems  Constitutional: Negative for chills, fever, malaise/fatigue and weight loss.  HENT: Negative for ear pain, hearing loss and tinnitus.   Eyes: Negative for blurred vision, double vision, pain and redness.  Respiratory: Positive for cough, shortness of breath and wheezing. Negative for hemoptysis.   Cardiovascular: Negative for chest  pain, palpitations, orthopnea and leg swelling.  Gastrointestinal: Negative for abdominal pain, constipation, diarrhea, nausea and vomiting.  Genitourinary: Negative for dysuria, frequency and hematuria.  Musculoskeletal: Negative for back pain, joint pain and neck pain.  Skin:       No acne, rash, or lesions  Neurological: Negative for dizziness, tremors, focal weakness and weakness.  Endo/Heme/Allergies: Negative for polydipsia. Does not bruise/bleed easily.  Psychiatric/Behavioral: Negative for depression. The patient is not nervous/anxious and does not have insomnia.      VITAL SIGNS:   Vitals:   03/11/18 1718 03/11/18 1719 03/11/18 2226 03/11/18 2311  BP: 130/85  135/85 129/87  Pulse: 93  (!) 104 81  Resp: (!) 24  18 18   Temp: 98.3 F (36.8 C)     TempSrc: Oral     SpO2: 91%  (!) 86% 100%  Weight:  85.7 kg    Height:  5\' 6"  (1.676 m)     Wt Readings from Last 3 Encounters:  03/11/18 85.7 kg  03/10/18 83.9 kg  12/19/17 81.2 kg    PHYSICAL EXAMINATION:  Physical Exam  Vitals reviewed. Constitutional: She is oriented to person, place, and time. She appears well-developed and well-nourished. No distress.  HENT:  Head: Normocephalic and atraumatic.  Mouth/Throat: Oropharynx is clear and moist.  Eyes: Pupils are equal, round, and reactive to light. Conjunctivae and EOM are normal. No scleral icterus.  Neck: Normal range of motion. Neck supple. No JVD present. No thyromegaly present.  Cardiovascular: Normal rate, regular rhythm and intact distal pulses. Exam reveals no gallop and no friction rub.  No murmur heard. Respiratory: Effort normal. No respiratory distress. She has wheezes. She has no rales.  GI: Soft. Bowel sounds are normal. She exhibits no distension. There is no abdominal tenderness.  Musculoskeletal: Normal range of motion.        General: No edema.     Comments: No arthritis, no gout  Lymphadenopathy:    She has no cervical adenopathy.  Neurological: She  is alert and oriented to person, place, and time. No cranial nerve deficit.  No dysarthria, no aphasia  Skin: Skin is warm and dry. No rash noted. No erythema.  Psychiatric: She has a normal mood and affect. Her behavior is normal. Judgment and thought content normal.    LABORATORY PANEL:   CBC Recent Labs  Lab 03/11/18 2300  WBC 17.4*  HGB 13.2  HCT 41.0  PLT 395   ------------------------------------------------------------------------------------------------------------------  Chemistries  Recent Labs  Lab 03/11/18 2300  NA 137  K 3.7  CL 104  CO2 24  GLUCOSE 126*  BUN 15  CREATININE 0.89  CALCIUM 9.5   ------------------------------------------------------------------------------------------------------------------  Cardiac Enzymes  Recent Labs  Lab 03/11/18 2300  TROPONINI <0.03   ------------------------------------------------------------------------------------------------------------------  RADIOLOGY:  Dg Chest 2 View  Result Date: 03/11/2018 CLINICAL DATA:  53 year old female with a history of shortness of breath EXAM: CHEST - 2 VIEW COMPARISON:  03/10/2018 FINDINGS: Cardiomediastinal silhouette unchanged in size and contour. No pneumothorax. No pleural effusion. No confluent airspace disease. Coarsened interstitial markings, similar to the comparison. No displaced fracture. IMPRESSION: Chronic changes without evidence of acute cardiopulmonary disease Electronically Signed   By: Corrie Mckusick D.O.   On: 03/11/2018 21:07   Dg Chest 2 View  Result Date: 03/10/2018 CLINICAL DATA:  Smoker. History of COPD, asthma. Now with cough for 1-2 weeks. EXAM: CHEST - 2 VIEW COMPARISON:  Chest x-ray dated 11/12/2013. FINDINGS: Heart size and mediastinal contours are within normal limits. Lungs are clear. No pleural effusion. Osseous structures about the chest are unremarkable. IMPRESSION: No active cardiopulmonary disease. No evidence of pneumonia or pulmonary edema.  Electronically Signed   By: Franki Cabot M.D.   On: 03/10/2018 08:29    EKG:   Orders placed or performed during the hospital encounter of 03/11/18  . EKG 12-Lead  . EKG 12-Lead    IMPRESSION AND PLAN:  Principal Problem:   COPD with acute exacerbation (HCC) -IV steroids, azithromycin, duo nebs, PRN antitussive and other supportive care, continue home meds Active Problems:   Diabetes ([place]) -sliding scale insulin coverage   CAD (coronary artery disease), native coronary artery -continue home medications   Anxiety -continue home dose anxiolytic   HLD (hyperlipidemia) -continue home dose antilipid  Chart review performed and case discussed with ED provider. Labs, imaging and/or ECG reviewed by provider and discussed with patient/family. Management plans discussed with the patient and/or family.  DVT PROPHYLAXIS: SubQ lovenox   GI PROPHYLAXIS:  None  ADMISSION STATUS: Inpatient     CODE STATUS: Full  TOTAL TIME TAKING CARE OF THIS PATIENT: 45 minutes.   Ethlyn Daniels 03/12/2018, 12:31 AM  [facility]  Office  [phone]  CC: Primary care physician; Patient, No Pcp Per  Note:  This document was prepared using Dragon voice recognition software and may include unintentional dictation errors.

## 2018-03-12 NOTE — Progress Notes (Addendum)
[place] at [facility]                                                                                                                                                                                  Patient Demographics   Erica Gomez, is a 53 y.o. female, DOB - Jul 12, 1965, PIR:[id number]  Admit date - 03/11/2018   Admitting Physician Lance Coon, MD  Outpatient Primary MD for the patient is Patient, No Pcp Per   LOS - 0  Subjective: Patient continues to be short of breath and having some cough.  Denies any fevers or chills.    Review of Systems:   CONSTITUTIONAL: No documented fever. No fatigue, weakness. No weight gain, no weight loss.  EYES: No blurry or double vision.  ENT: No tinnitus. No postnasal drip. No redness of the oropharynx.  RESPIRATORY: Positive cough, no wheeze, no hemoptysis.  Positive dyspnea.  CARDIOVASCULAR: No chest pain. No orthopnea. No palpitations. No syncope.  GASTROINTESTINAL: No nausea, no vomiting or diarrhea. No abdominal pain. No melena or hematochezia.  GENITOURINARY: No dysuria or hematuria.  ENDOCRINE: No polyuria or nocturia. No heat or cold intolerance.  HEMATOLOGY: No anemia. No bruising. No bleeding.  INTEGUMENTARY: No rashes. No lesions.  MUSCULOSKELETAL: No arthritis. No swelling. No gout.  NEUROLOGIC: No numbness, tingling, or ataxia. No seizure-type activity.  PSYCHIATRIC: No anxiety. No insomnia. No ADD.    Vitals:   Vitals:   03/12/18 0454 03/12/18 0515 03/12/18 0810 03/12/18 1207  BP: (!) 145/85   (!) 146/90  Pulse: 96   81  Resp: 20   18  Temp: 98.3 F (36.8 C)   97.8 F (36.6 C)  TempSrc: Oral   Oral  SpO2: 94% 94% 94% 95%  Weight:      Height:        Wt Readings from Last 3 Encounters:  03/11/18 85.7 kg  03/10/18 83.9 kg  12/19/17 81.2 kg    No intake or output data in the 24 hours ending 03/12/18 1356  Physical Exam:   GENERAL: Pleasant-appearing in no apparent distress.  HEAD,  EYES, EARS, NOSE AND THROAT: Atraumatic, normocephalic. Extraocular muscles are intact. Pupils equal and reactive to light. Sclerae anicteric. No conjunctival injection. No oro-pharyngeal erythema.  NECK: Supple. There is no jugular venous distention. No bruits, no lymphadenopathy, no thyromegaly.  HEART: Regular rate and rhythm,. No murmurs, no rubs, no clicks.  LUNGS: Bilateral wheezing throughout both lungs no accessory muscle usage ABDOMEN: Soft, flat, nontender, nondistended. Has good bowel sounds. No hepatosplenomegaly appreciated.  EXTREMITIES: No evidence of any cyanosis, clubbing, or peripheral edema.  +2 pedal and radial pulses bilaterally.  NEUROLOGIC:  The patient is alert, awake, and oriented x3 with no focal motor or sensory deficits appreciated bilaterally.  SKIN: Moist and warm with no rashes appreciated.  Psych: Not anxious, depressed LN: No inguinal LN enlargement    Antibiotics   Anti-infectives (From admission, onward)   Start     Dose/Rate Route Frequency Ordered Stop   03/12/18 1000  azithromycin (ZITHROMAX) tablet 500 mg     500 mg Oral Daily 03/12/18 0145     03/11/18 2045  doxycycline (VIBRA-TABS) tablet 100 mg     100 mg Oral  Once 03/11/18 2038 03/11/18 2100      Medications   Scheduled Meds: . aspirin EC  81 mg Oral Daily  . azithromycin  500 mg Oral Daily  . budesonide (PULMICORT) nebulizer solution  0.25 mg Nebulization BID  . citalopram  20 mg Oral Daily  . enoxaparin (LOVENOX) injection  40 mg Subcutaneous Q24H  . guaiFENesin  600 mg Oral BID  . [START ON 03/13/2018] Influenza vac split quadrivalent PF  0.5 mL Intramuscular Tomorrow-1000  . insulin aspart  0-9 Units Subcutaneous TID AC & HS  . ipratropium-albuterol  3 mL Nebulization Q6H  . mouth rinse  15 mL Mouth Rinse BID  . methylPREDNISolone (SOLU-MEDROL) injection  60 mg Intravenous Q6H  . risperiDONE  2 mg Oral Daily  . rosuvastatin  10 mg Oral Daily   Continuous Infusions: PRN  Meds:.acetaminophen **OR** acetaminophen, benzonatate, guaiFENesin-dextromethorphan, ondansetron **OR** ondansetron (ZOFRAN) IV   Data Review:   Micro Results No results found for this or any previous visit (from the past 240 hour(s)).  Radiology Reports Dg Chest 2 View  Result Date: 03/11/2018 CLINICAL DATA:  53 year old female with a history of shortness of breath EXAM: CHEST - 2 VIEW COMPARISON:  03/10/2018 FINDINGS: Cardiomediastinal silhouette unchanged in size and contour. No pneumothorax. No pleural effusion. No confluent airspace disease. Coarsened interstitial markings, similar to the comparison. No displaced fracture. IMPRESSION: Chronic changes without evidence of acute cardiopulmonary disease Electronically Signed   By: Corrie Mckusick D.O.   On: 03/11/2018 21:07   Dg Chest 2 View  Result Date: 03/10/2018 CLINICAL DATA:  Smoker. History of COPD, asthma. Now with cough for 1-2 weeks. EXAM: CHEST - 2 VIEW COMPARISON:  Chest x-ray dated 11/12/2013. FINDINGS: Heart size and mediastinal contours are within normal limits. Lungs are clear. No pleural effusion. Osseous structures about the chest are unremarkable. IMPRESSION: No active cardiopulmonary disease. No evidence of pneumonia or pulmonary edema. Electronically Signed   By: Franki Cabot M.D.   On: 03/10/2018 08:29     CBC Recent Labs  Lab 03/10/18 0800 03/11/18 2300 03/12/18 0550  WBC 7.5 17.4* 16.7*  HGB 13.3 13.2 12.6  HCT 41.7 41.0 39.4  PLT 364 395 400  MCV 90.8 92.1 92.9  MCH 29.0 29.7 29.7  MCHC 31.9 32.2 32.0  RDW 12.8 12.9 13.2  LYMPHSABS  --  3.2  --   MONOABS  --  0.7  --   EOSABS  --  0.0  --   BASOSABS  --  0.0  --     Chemistries  Recent Labs  Lab 03/10/18 0800 03/11/18 2300 03/12/18 0550  NA 139 137 140  K 4.1 3.7 4.3  CL 107 104 110  CO2 26 24 25   GLUCOSE 116* 126* 268*  BUN 16 15 17   CREATININE 0.94 0.89 1.01*  CALCIUM 9.3 9.5 9.6    ------------------------------------------------------------------------------------------------------------------ estimated creatinine clearance is 71.9 mL/min (A) (by C-G formula  based on SCr of 1.01 mg/dL (H)). ------------------------------------------------------------------------------------------------------------------ No results for input(s): HGBA1C in the last 72 hours. ------------------------------------------------------------------------------------------------------------------ No results for input(s): CHOL, HDL, LDLCALC, TRIG, CHOLHDL, LDLDIRECT in the last 72 hours. ------------------------------------------------------------------------------------------------------------------ No results for input(s): TSH, T4TOTAL, T3FREE, THYROIDAB in the last 72 hours.  Invalid input(s): FREET3 ------------------------------------------------------------------------------------------------------------------ No results for input(s): VITAMINB12, FOLATE, FERRITIN, TIBC, IRON, RETICCTPCT in the last 72 hours.  Coagulation profile No results for input(s): INR, PROTIME in the last 168 hours.  No results for input(s): DDIMER in the last 72 hours.  Cardiac Enzymes Recent Labs  Lab 03/10/18 0800 03/11/18 2300  TROPONINI <0.03 <0.03   ------------------------------------------------------------------------------------------------------------------ Invalid input(s): POCBNP    Assessment & Plan   Patient is 53 year old presenting with worsening shortness of breath   1.  Acute COPD with acute exacerbation ([place]) -continue IV steroids and azithromycin and DuoNebs.  Add Pulmicort to current regimen  2.  Diabetes ([place]) -blood sugars elevated due to steroids sliding scale insulin coverage 3. CAD (coronary artery disease), native coronary artery -continue home medications 4.  Anxiety -continue home dose anxiolytic 5.  HLD (hyperlipidemia) -continue home dose antilipid 6.  Nicotine abuse  smoking cessation provided strongly recommended patient to continue not to smoke 4 minutes spent patient states that she is on Chantix I will resume     Code Status Orders  (From admission, onward)         Start     Ordered   03/12/18 0146  Full code  Continuous     03/12/18 0145        Code Status History    This patient has a current code status but no historical code status.           Consults  none   DVT Prophylaxis  Lovenox   Lab Results  Component Value Date   PLT 400 03/12/2018     Time Spent in minutes 50min 12pm to 1245pm Greater than 50% of time spent in care coordination and counseling patient regarding the condition and plan of care.   Dustin Flock M.D on 03/12/2018 at 1:56 PM  Between 7am to 6pm - Pager - [phone]  After 6pm go to [email or web address] - Proofreader  [employer]   Office  [phone]

## 2018-03-12 NOTE — ED Notes (Signed)
ED TO INPATIENT HANDOFF REPORT  ED Nurse Name and Phone #: Daiva Nakayama, RN [phone]  Name/Age/Gender Erica Gomez 53 y.o. female Room/Bed: ED01A/ED01A  Code Status   Code Status: Not on file  Home/SNF/Other Home Pt A&Ox4 Is this baseline? Yes  Triage Complete: Triage complete  Chief Complaint SOB  Triage Note Pt c/o increased SOB over the past week, was seen here yesterday and dx with bronchitis and given Rx for steroids, states she has been taking them and inhaler but it is getting worse, not able to walk more then a couple feet without getting out of breathing,    Allergies Allergies  Allergen Reactions  . Flax Seed Oil [Bio-Flax] Hives    Pt. Self reported    Level of Care/Admitting Diagnosis ED Disposition    ED Disposition Condition Comment   Admit  Hospital Area: [place] [[id number]]  Level of Care: Med-Surg [16]  Diagnosis: COPD with acute exacerbation [facility]) [[id number]]  Admitting Physician: Lance Coon [[id number]]  Attending Physician: Lance Coon [phone]  Estimated length of stay: past midnight tomorrow  Certification:: I certify this patient will need inpatient services for at least 2 midnights  PT Class (Do Not Modify): Inpatient [101]  PT Acc Code (Do Not Modify): Private [1]       Medical/Surgery History Past Medical History:  Diagnosis Date  . Allergy   . Anxiety   . Asthma   . Bipolar affective, manic ([place]) 1996  . COPD (chronic obstructive pulmonary disease) ([place])   . Depression   . HLD (hyperlipidemia) 03/12/2018  . Lung nodule, multiple 09/26/2016  . Manic depressive illness ([place]) 1996  . Seizures ([place])   . Substance abuse ([place])   . Tobacco abuse 10/04/2016   Past Surgical History:  Procedure Laterality Date  . ABDOMINAL HYSTERECTOMY    . TUBAL LIGATION       IV Location/Drains/Wounds Patient Lines/Drains/Airways Status   Active Line/Drains/Airways    Name:   Placement date:   Placement time:   Site:   Days:    Peripheral IV 03/11/18 Left Antecubital   03/11/18    2300    Antecubital   1          Intake/Output Last 24 hours No intake or output data in the 24 hours ending 03/12/18 0126  Labs/Imaging Results for orders placed or performed during the hospital encounter of 03/11/18 (from the past 48 hour(s))  CBC with Differential/Platelet     Status: Abnormal   Collection Time: 03/11/18 11:00 PM  Result Value Ref Range   WBC 17.4 (H) 4.0 - 10.5 K/uL   RBC 4.45 3.87 - 5.11 MIL/uL   Hemoglobin 13.2 12.0 - 15.0 g/dL   HCT 41.0 36.0 - 46.0 %   MCV 92.1 80.0 - 100.0 fL   MCH 29.7 26.0 - 34.0 pg   MCHC 32.2 30.0 - 36.0 g/dL   RDW 12.9 11.5 - 15.5 %   Platelets 395 150 - 400 K/uL   nRBC 0.0 0.0 - 0.2 %   Neutrophils Relative % 77 %   Neutro Abs 13.4 (H) 1.7 - 7.7 K/uL   Lymphocytes Relative 18 %   Lymphs Abs 3.2 0.7 - 4.0 K/uL   Monocytes Relative 4 %   Monocytes Absolute 0.7 0.1 - 1.0 K/uL   Eosinophils Relative 0 %   Eosinophils Absolute 0.0 0.0 - 0.5 K/uL   Basophils Relative 0 %   Basophils Absolute 0.0 0.0 - 0.1 K/uL   Immature Granulocytes  1 %   Abs Immature Granulocytes 0.11 (H) 0.00 - 0.07 K/uL    Comment: Performed at [facility], [address] [place], [place] [address]  Troponin I - ONCE - STAT     Status: None   Collection Time: 03/11/18 11:00 PM  Result Value Ref Range   Troponin I <0.03 <0.03 ng/mL    Comment: Performed at [facility], [address] [place], [place] [address]  Basic metabolic panel     Status: Abnormal   Collection Time: 03/11/18 11:00 PM  Result Value Ref Range   Sodium 137 135 - 145 mmol/L   Potassium 3.7 3.5 - 5.1 mmol/L   Chloride 104 98 - 111 mmol/L   CO2 24 22 - 32 mmol/L   Glucose, Bld 126 (H) 70 - 99 mg/dL   BUN 15 6 - 20 mg/dL   Creatinine, Ser 0.89 0.44 - 1.00 mg/dL   Calcium 9.5 8.9 - 10.3 mg/dL   GFR calc non Af Amer >60 >60 mL/min   GFR calc Af Amer >60 >60 mL/min   Anion gap 9 5 - 15    Comment: Performed  at [facility], [address] [place], [place] [address]   Dg Chest 2 View  Result Date: 03/11/2018 CLINICAL DATA:  53 year old female with a history of shortness of breath EXAM: CHEST - 2 VIEW COMPARISON:  03/10/2018 FINDINGS: Cardiomediastinal silhouette unchanged in size and contour. No pneumothorax. No pleural effusion. No confluent airspace disease. Coarsened interstitial markings, similar to the comparison. No displaced fracture. IMPRESSION: Chronic changes without evidence of acute cardiopulmonary disease Electronically Signed   By: Corrie Mckusick D.O.   On: 03/11/2018 21:07   Dg Chest 2 View  Result Date: 03/10/2018 CLINICAL DATA:  Smoker. History of COPD, asthma. Now with cough for 1-2 weeks. EXAM: CHEST - 2 VIEW COMPARISON:  Chest x-ray dated 11/12/2013. FINDINGS: Heart size and mediastinal contours are within normal limits. Lungs are clear. No pleural effusion. Osseous structures about the chest are unremarkable. IMPRESSION: No active cardiopulmonary disease. No evidence of pneumonia or pulmonary edema. Electronically Signed   By: Franki Cabot M.D.   On: 03/10/2018 08:29    Pending Labs [employer] (From admission, onward)    Start     Ordered   Signed and Held  HIV antibody (Routine Testing)  Once,   R     Signed and Held   Signed and Held  CBC  (enoxaparin (LOVENOX)    CrCl >/= 30 ml/min)  Once,   R    Comments:  Baseline for enoxaparin therapy IF NOT ALREADY DRAWN.  Notify MD if PLT < 100 K.    Signed and Held   Signed and Held  Creatinine, serum  (enoxaparin (LOVENOX)    CrCl >/= 30 ml/min)  Once,   R    Comments:  Baseline for enoxaparin therapy IF NOT ALREADY DRAWN.    Signed and Held   Signed and Held  Creatinine, serum  (enoxaparin (LOVENOX)    CrCl >/= 30 ml/min)  Weekly,   R    Comments:  while on enoxaparin therapy    Signed and Held   Signed and Held  Basic metabolic panel  Tomorrow morning,   R     Signed and Held   Signed and Held  CBC  Tomorrow  morning,   R     Signed and Held          Vitals/Pain Today's Vitals  03/11/18 1731 03/11/18 2226 03/11/18 2311 03/12/18 0031  BP:  135/85 129/87 (!) 146/91  Pulse:  (!) 104 81 87  Resp:  18 18 18   Temp:      TempSrc:      SpO2:  (!) 86% 100% 96%  Weight:      Height:      PainSc: 7        Isolation Precautions No active isolations  Medications Medications  ipratropium-albuterol (DUONEB) 0.5-2.5 (3) MG/3ML nebulizer solution 3 mL (3 mLs Nebulization Given 03/11/18 2200)  ipratropium-albuterol (DUONEB) 0.5-2.5 (3) MG/3ML nebulizer solution 3 mL (3 mLs Nebulization Given 03/11/18 2200)  ipratropium-albuterol (DUONEB) 0.5-2.5 (3) MG/3ML nebulizer solution 3 mL (3 mLs Nebulization Given 03/11/18 2200)  doxycycline (VIBRA-TABS) tablet 100 mg (100 mg Oral Given 03/11/18 2100)  methylPREDNISolone sodium succinate (SOLU-MEDROL) 125 mg/2 mL injection 125 mg (125 mg Intravenous Given 03/11/18 2314)    Mobility walks Low fall risk   Focused Assessments    Recommendations: See Admitting Provider Note  Report given to:   Additional Notes: Extreme SHOB with minimal exertion

## 2018-03-13 LAB — HEMOGLOBIN A1C
Hgb A1c MFr Bld: 7 % — ABNORMAL HIGH (ref 4.8–5.6)
Mean Plasma Glucose: 154.2 mg/dL

## 2018-03-13 LAB — CBC
HCT: 39.5 % (ref 36.0–46.0)
Hemoglobin: 12.9 g/dL (ref 12.0–15.0)
MCH: 29.9 pg (ref 26.0–34.0)
MCHC: 32.7 g/dL (ref 30.0–36.0)
MCV: 91.4 fL (ref 80.0–100.0)
Platelets: 409 10*3/uL — ABNORMAL HIGH (ref 150–400)
RBC: 4.32 MIL/uL (ref 3.87–5.11)
RDW: 12.9 % (ref 11.5–15.5)
WBC: 25.3 10*3/uL — ABNORMAL HIGH (ref 4.0–10.5)
nRBC: 0 % (ref 0.0–0.2)

## 2018-03-13 LAB — BASIC METABOLIC PANEL
Anion gap: 8 (ref 5–15)
BUN: 17 mg/dL (ref 6–20)
CO2: 26 mmol/L (ref 22–32)
Calcium: 9.4 mg/dL (ref 8.9–10.3)
Chloride: 103 mmol/L (ref 98–111)
Creatinine, Ser: 0.79 mg/dL (ref 0.44–1.00)
GFR calc Af Amer: >60 mL/min (ref >60)
GFR calc non Af Amer: >60 mL/min (ref >60)
Glucose, Bld: 153 mg/dL — ABNORMAL HIGH (ref 70–99)
Potassium: 4.2 mmol/L (ref 3.5–5.1)
Sodium: 137 mmol/L (ref 135–145)

## 2018-03-13 LAB — PROCALCITONIN: Procalcitonin: <0.1 ng/mL

## 2018-03-13 LAB — HIV ANTIBODY (ROUTINE TESTING W REFLEX): HIV Screen 4th Generation wRfx: NONREACTIVE

## 2018-03-13 LAB — GLUCOSE, CAPILLARY
Glucose-Capillary: 145 mg/dL — ABNORMAL HIGH (ref 70–99)
Glucose-Capillary: 207 mg/dL — ABNORMAL HIGH (ref 70–99)
Glucose-Capillary: 211 mg/dL — ABNORMAL HIGH (ref 70–99)
Glucose-Capillary: 188 mg/dL — ABNORMAL HIGH (ref 70–99)

## 2018-03-13 MED ORDER — METHYLPREDNISOLONE SODIUM SUCC 125 MG IJ SOLR
60.0000 mg | Freq: Two times a day (BID) | INTRAMUSCULAR | Status: DC
  Administered 2018-03-13 – 2018-03-14 (×2): 60 mg via INTRAVENOUS
  Filled 2018-03-13 (×2): qty 2

## 2018-03-13 MED ORDER — ACETYLCYSTEINE 20 % IN SOLN
4.0000 mL | Freq: Two times a day (BID) | RESPIRATORY_TRACT | Status: DC
  Administered 2018-03-13 – 2018-03-14 (×3): 4 mL via RESPIRATORY_TRACT
  Filled 2018-03-13 (×3): qty 4

## 2018-03-13 MED ORDER — DOCUSATE SODIUM 100 MG PO CAPS
100.0000 mg | ORAL_CAPSULE | Freq: Two times a day (BID) | ORAL | Status: DC
  Administered 2018-03-13 – 2018-03-14 (×2): 100 mg via ORAL
  Filled 2018-03-13 (×2): qty 1

## 2018-03-13 MED ORDER — POLYETHYLENE GLYCOL 3350 17 G PO PACK
17.0000 g | PACK | Freq: Every day | ORAL | Status: DC
  Administered 2018-03-13 – 2018-03-14 (×2): 17 g via ORAL
  Filled 2018-03-13 (×2): qty 1

## 2018-03-13 NOTE — Progress Notes (Signed)
[place] at [facility]                                                                                                                                                                                  Patient Demographics   Erica Gomez, is a 53 y.o. female, DOB - [birth year], LFY:[id number]  Admit date - 03/11/2018   Admitting Physician Lance Coon, MD  Outpatient Primary MD for the patient is Patient, No Pcp Per   LOS - 1  Subjective: Patient still short of breath oxygen dropped with activity   Review of Systems:   CONSTITUTIONAL: No documented fever. No fatigue, weakness. No weight gain, no weight loss.  EYES: No blurry or double vision.  ENT: No tinnitus. No postnasal drip. No redness of the oropharynx.  RESPIRATORY: Positive cough, no wheeze, no hemoptysis.  Positive dyspnea.  CARDIOVASCULAR: No chest pain. No orthopnea. No palpitations. No syncope.  GASTROINTESTINAL: No nausea, no vomiting or diarrhea. No abdominal pain. No melena or hematochezia.  GENITOURINARY: No dysuria or hematuria.  ENDOCRINE: No polyuria or nocturia. No heat or cold intolerance.  HEMATOLOGY: No anemia. No bruising. No bleeding.  INTEGUMENTARY: No rashes. No lesions.  MUSCULOSKELETAL: No arthritis. No swelling. No gout.  NEUROLOGIC: No numbness, tingling, or ataxia. No seizure-type activity.  PSYCHIATRIC: No anxiety. No insomnia. No ADD.    Vitals:   Vitals:   03/13/18 0506 03/13/18 0823 03/13/18 1324 03/13/18 1339  BP: 135/90     Pulse: 81     Resp: 18     Temp: 97.9 F (36.6 C)     TempSrc: Oral     SpO2: 96% 96% 95% 95%  Weight:      Height:        Wt Readings from Last 3 Encounters:  03/11/18 85.7 kg  03/10/18 83.9 kg  12/19/17 81.2 kg     Intake/Output Summary (Last 24 hours) at 03/13/2018 1415 Last data filed at 03/13/2018 0933 Gross per 24 hour  Intake 717 ml  Output -  Net 717 ml    Physical Exam:   GENERAL: Pleasant-appearing in no apparent  distress.  HEAD, EYES, EARS, NOSE AND THROAT: Atraumatic, normocephalic. Extraocular muscles are intact. Pupils equal and reactive to light. Sclerae anicteric. No conjunctival injection. No oro-pharyngeal erythema.  NECK: Supple. There is no jugular venous distention. No bruits, no lymphadenopathy, no thyromegaly.  HEART: Regular rate and rhythm,. No murmurs, no rubs, no clicks.  LUNGS: Bilateral wheezing throughout both lungs no accessory muscle usage ABDOMEN: Soft, flat, nontender, nondistended. Has good bowel sounds. No hepatosplenomegaly appreciated.  EXTREMITIES: No evidence of any cyanosis, clubbing, or peripheral edema.  +2 pedal  and radial pulses bilaterally.  NEUROLOGIC: The patient is alert, awake, and oriented x3 with no focal motor or sensory deficits appreciated bilaterally.  SKIN: Moist and warm with no rashes appreciated.  Psych: Not anxious, depressed LN: No inguinal LN enlargement    Antibiotics   Anti-infectives (From admission, onward)   Start     Dose/Rate Route Frequency Ordered Stop   03/12/18 1000  azithromycin (ZITHROMAX) tablet 500 mg     500 mg Oral Daily 03/12/18 0145     03/11/18 2045  doxycycline (VIBRA-TABS) tablet 100 mg     100 mg Oral  Once 03/11/18 2038 03/11/18 2100      Medications   Scheduled Meds: . acetylcysteine  4 mL Nebulization BID  . aspirin EC  81 mg Oral Daily  . azithromycin  500 mg Oral Daily  . budesonide (PULMICORT) nebulizer solution  0.25 mg Nebulization BID  . citalopram  20 mg Oral Daily  . enoxaparin (LOVENOX) injection  40 mg Subcutaneous Q24H  . guaiFENesin  600 mg Oral BID  . Influenza vac split quadrivalent PF  0.5 mL Intramuscular Tomorrow-1000  . insulin aspart  0-9 Units Subcutaneous TID AC & HS  . ipratropium-albuterol  3 mL Nebulization Q6H  . mouth rinse  15 mL Mouth Rinse BID  . methylPREDNISolone (SOLU-MEDROL) injection  60 mg Intravenous Q12H  . risperiDONE  2 mg Oral Daily  . rosuvastatin  10 mg Oral Daily   . varenicline  1 mg Oral BID   Continuous Infusions: PRN Meds:.acetaminophen **OR** acetaminophen, benzonatate, guaiFENesin-dextromethorphan, ondansetron **OR** ondansetron (ZOFRAN) IV   Data Review:   Micro Results No results found for this or any previous visit (from the past 240 hour(s)).  Radiology Reports Dg Chest 2 View  Result Date: 03/11/2018 CLINICAL DATA:  53 year old female with a history of shortness of breath EXAM: CHEST - 2 VIEW COMPARISON:  03/10/2018 FINDINGS: Cardiomediastinal silhouette unchanged in size and contour. No pneumothorax. No pleural effusion. No confluent airspace disease. Coarsened interstitial markings, similar to the comparison. No displaced fracture. IMPRESSION: Chronic changes without evidence of acute cardiopulmonary disease Electronically Signed   By: Corrie Mckusick D.O.   On: 03/11/2018 21:07   Dg Chest 2 View  Result Date: 03/10/2018 CLINICAL DATA:  Smoker. History of COPD, asthma. Now with cough for 1-2 weeks. EXAM: CHEST - 2 VIEW COMPARISON:  Chest x-ray dated 11/12/2013. FINDINGS: Heart size and mediastinal contours are within normal limits. Lungs are clear. No pleural effusion. Osseous structures about the chest are unremarkable. IMPRESSION: No active cardiopulmonary disease. No evidence of pneumonia or pulmonary edema. Electronically Signed   By: Franki Cabot M.D.   On: 03/10/2018 08:29     CBC Recent Labs  Lab 03/10/18 0800 03/11/18 2300 03/12/18 0550 03/13/18 0500  WBC 7.5 17.4* 16.7* 25.3*  HGB 13.3 13.2 12.6 12.9  HCT 41.7 41.0 39.4 39.5  PLT 364 395 400 409*  MCV 90.8 92.1 92.9 91.4  MCH 29.0 29.7 29.7 29.9  MCHC 31.9 32.2 32.0 32.7  RDW 12.8 12.9 13.2 12.9  LYMPHSABS  --  3.2  --   --   MONOABS  --  0.7  --   --   EOSABS  --  0.0  --   --   BASOSABS  --  0.0  --   --     Chemistries  Recent Labs  Lab 03/10/18 0800 03/11/18 2300 03/12/18 0550 03/13/18 0500  NA 139 137 140 137  K 4.1 3.7 4.3  4.2  CL 107 104 110 103   CO2 26 24 25 26   GLUCOSE 116* 126* 268* 153*  BUN 16 15 17 17   CREATININE 0.94 0.89 1.01* 0.79  CALCIUM 9.3 9.5 9.6 9.4   ------------------------------------------------------------------------------------------------------------------ estimated creatinine clearance is 90.8 mL/min (by C-G formula based on SCr of 0.79 mg/dL). ------------------------------------------------------------------------------------------------------------------ Recent Labs    03/12/18 0550  HGBA1C 7.0*   ------------------------------------------------------------------------------------------------------------------ No results for input(s): CHOL, HDL, LDLCALC, TRIG, CHOLHDL, LDLDIRECT in the last 72 hours. ------------------------------------------------------------------------------------------------------------------ No results for input(s): TSH, T4TOTAL, T3FREE, THYROIDAB in the last 72 hours.  Invalid input(s): FREET3 ------------------------------------------------------------------------------------------------------------------ No results for input(s): VITAMINB12, FOLATE, FERRITIN, TIBC, IRON, RETICCTPCT in the last 72 hours.  Coagulation profile No results for input(s): INR, PROTIME in the last 168 hours.  No results for input(s): DDIMER in the last 72 hours.  Cardiac Enzymes Recent Labs  Lab 03/10/18 0800 03/11/18 2300  TROPONINI <0.03 <0.03   ------------------------------------------------------------------------------------------------------------------ Invalid input(s): POCBNP    Assessment & Plan   Patient is 53 year old presenting with worsening shortness of breath   1.  Acute COPD with acute exacerbation ([place]) -continue IV steroids and azithromycin and DuoNebs.  Add Pulmicort to current regimen 2.  Diabetes ([place]) -blood sugars elevated due to steroids sliding scale insulin coverage hemoglobin A1c 7 3. CAD (coronary artery disease), native coronary artery -continue home  medications 4.  Anxiety -continue home dose anxiolytic 5.  HLD (hyperlipidemia) -continue home dose antilipid 6.  Nicotine abuse smoking cessation provided strongly recommended patient to continue not to smoke 4 minutes spent patient states that she is on Chantix I will resume     Code Status Orders  (From admission, onward)         Start     Ordered   03/12/18 0146  Full code  Continuous     03/12/18 0145        Code Status History    This patient has a current code status but no historical code status.           Consults  none   DVT Prophylaxis  Lovenox   Lab Results  Component Value Date   PLT 409 (H) 03/13/2018     Time Spent in minutes 35 minutes  Dustin Flock M.D on 03/13/2018 at 2:15 PM  Between 7am to 6pm - Pager - [phone]  After 6pm go to [email or web address] - Proofreader  [employer]   Office  [phone]

## 2018-03-13 NOTE — Progress Notes (Signed)
Tried to wean pt down to 2.5 liters, but pt was desating to the 80's during sleep. Increased O2 to 4L/min and patient at 96%. Will continue to monitor.

## 2018-03-13 NOTE — Care Management Note (Signed)
Case Management Note  Patient Details  Name: DAGNY FIORENTINO MRN: [id number] Date of Birth: August 05, 1965  Subjective/Objective:                  Met with patient to discuss DC plans and needs Patient is in service with Med management for medications, I gave her to [facility] also and explained that they often work together. Patient stated that she has no other needs at this time.  She does have transportation.    Action/Plan: Provided with the [facility]   Expected Discharge Date:                  Expected Discharge Plan:     In-House Referral:     Discharge planning Services  CM Consult  Post Acute Care Choice:    Choice offered to:     DME Arranged:    DME Agency:     HH Arranged:    [facility] Agency:     Status of Service:  Completed, signed off  If discussed at [employer] of Stay Meetings, dates discussed:    Additional Comments:  Su Hilt, RN 03/13/2018, 1:39 PM

## 2018-03-13 NOTE — Progress Notes (Signed)
Pt ambulated around nurses station. O2 stat remained in 90's ranging from 93-96 overall. Pt weaned to 1 L [place]. Pt decreases in O2 stats appaer to be related to when she in napping or sleeping.

## 2018-03-14 LAB — CBC
HCT: 39.2 % (ref 36.0–46.0)
Hemoglobin: 12.8 g/dL (ref 12.0–15.0)
MCH: 29.8 pg (ref 26.0–34.0)
MCHC: 32.7 g/dL (ref 30.0–36.0)
MCV: 91.2 fL (ref 80.0–100.0)
Platelets: 396 10*3/uL (ref 150–400)
RBC: 4.3 MIL/uL (ref 3.87–5.11)
RDW: 13 % (ref 11.5–15.5)
WBC: 22.3 10*3/uL — ABNORMAL HIGH (ref 4.0–10.5)
nRBC: 0 % (ref 0.0–0.2)

## 2018-03-14 LAB — BASIC METABOLIC PANEL
Anion gap: 8 (ref 5–15)
BUN: 20 mg/dL (ref 6–20)
CO2: 27 mmol/L (ref 22–32)
Calcium: 9.3 mg/dL (ref 8.9–10.3)
Chloride: 104 mmol/L (ref 98–111)
Creatinine, Ser: 0.96 mg/dL (ref 0.44–1.00)
GFR calc Af Amer: >60 mL/min (ref >60)
GFR calc non Af Amer: >60 mL/min (ref >60)
Glucose, Bld: 153 mg/dL — ABNORMAL HIGH (ref 70–99)
Potassium: 4.3 mmol/L (ref 3.5–5.1)
Sodium: 139 mmol/L (ref 135–145)

## 2018-03-14 LAB — GLUCOSE, CAPILLARY: Glucose-Capillary: 125 mg/dL — ABNORMAL HIGH (ref 70–99)

## 2018-03-14 MED ORDER — IPRATROPIUM-ALBUTEROL 0.5-2.5 (3) MG/3ML IN SOLN: 3.0000 mL | Freq: Four times a day (QID) | RESPIRATORY_TRACT | 2 refills | Status: DC

## 2018-03-14 MED ORDER — GUAIFENESIN ER 600 MG PO TB12: 600.0000 mg | ORAL_TABLET | Freq: Two times a day (BID) | ORAL | 0 refills | Status: AC

## 2018-03-14 MED ORDER — AZITHROMYCIN 500 MG PO TABS: 500.0000 mg | ORAL_TABLET | Freq: Every day | ORAL | 0 refills | Status: AC

## 2018-03-14 MED ORDER — TIOTROPIUM BROMIDE MONOHYDRATE 18 MCG IN CAPS: 18.0000 ug | ORAL_CAPSULE | Freq: Every day | RESPIRATORY_TRACT | 11 refills | Status: DC

## 2018-03-14 MED ORDER — GUAIFENESIN-DM 100-10 MG/5ML PO SYRP: 5.0000 mL | ORAL_SOLUTION | ORAL | 0 refills | Status: DC | PRN

## 2018-03-14 MED ORDER — PREDNISONE 10 MG (21) PO TBPK: ORAL_TABLET | ORAL | 0 refills | Status: DC

## 2018-03-14 NOTE — Progress Notes (Signed)
Checked with ED  For patient shoes, they did not have, Care manager got her a pair to go home with, patient now thinks relative  took them to patients  home in a bag.

## 2018-03-14 NOTE — Discharge Summary (Signed)
[place] at [facility], 53 y.o., DOB 02/04/1966, MRN [id number]. Admission date: 03/11/2018 Discharge Date 03/14/2018 Primary MD Patient, No Pcp Per Admitting Physician Lance Coon, MD  Admission Diagnosis  COPD exacerbation ([place]) [J44.1] Acute respiratory failure with hypoxia [facility]) [J96.01]  Discharge Diagnosis   Principal Problem:   COPD with acute exacerbation ([place])   Diabetes ([place])   CAD (coronary artery disease), native coronary artery   Anxiety   HLD (hyperlipidemia)            Hospital Course  Erica Gomez  is a 53 y.o. female who presents with chief complaint as above.  Patient presents to the ED after about 1 week of progressively increasing shortness of breath.  Pt was hypoxic on presentation cxr was negative.  Patient acute COPD exacerbation she is still requiring oxygen.  We will arrange for home.            Consults  None  Significant Tests:  See full reports for all details     Dg Chest 2 View  Result Date: 03/11/2018 CLINICAL DATA:  53 year old female with a history of shortness of breath EXAM: CHEST - 2 VIEW COMPARISON:  03/10/2018 FINDINGS: Cardiomediastinal silhouette unchanged in size and contour. No pneumothorax. No pleural effusion. No confluent airspace disease. Coarsened interstitial markings, similar to the comparison. No displaced fracture. IMPRESSION: Chronic changes without evidence of acute cardiopulmonary disease Electronically Signed   By: Corrie Mckusick D.O.   On: 03/11/2018 21:07   Dg Chest 2 View  Result Date: 03/10/2018 CLINICAL DATA:  Smoker. History of COPD, asthma. Now with cough for 1-2 weeks. EXAM: CHEST - 2 VIEW COMPARISON:  Chest x-ray dated 11/12/2013. FINDINGS: Heart size and mediastinal contours are within normal limits. Lungs are clear. No pleural effusion. Osseous structures about the chest are unremarkable. IMPRESSION: No active cardiopulmonary disease. No evidence of pneumonia or  pulmonary edema. Electronically Signed   By: Franki Cabot M.D.   On: 03/10/2018 08:29       Today   Subjective:   Erica Gomez patient's breathing is improved she is very anxious to go home Objective:   Blood pressure (!) 155/97, pulse 70, temperature 98.1 F (36.7 C), temperature source Oral, resp. rate 19, height 5\' 6"  (1.676 m), weight 85.7 kg, SpO2 92 %.  .  Intake/Output Summary (Last 24 hours) at 03/14/2018 1122 Last data filed at 03/13/2018 2300 Gross per 24 hour  Intake 340 ml  Output -  Net 340 ml    Exam VITAL SIGNS: Blood pressure (!) 155/97, pulse 70, temperature 98.1 F (36.7 C), temperature source Oral, resp. rate 19, height 5\' 6"  (1.676 m), weight 85.7 kg, SpO2 92 %.  GENERAL:  53 y.o.-year-old patient lying in the bed with no acute distress.  EYES: Pupils equal, round, reactive to light and accommodation. No scleral icterus. Extraocular muscles intact.  HEENT: Head atraumatic, normocephalic. Oropharynx and nasopharynx clear.  NECK:  Supple, no jugular venous distention. No thyroid enlargement, no tenderness.  LUNGS: Normal breath sounds bilaterally, no wheezing, rales,rhonchi or crepitation. No use of accessory muscles of respiration.  CARDIOVASCULAR: S1, S2 normal. No murmurs, rubs, or gallops.  ABDOMEN: Soft, nontender, nondistended. Bowel sounds present. No organomegaly or mass.  EXTREMITIES: No pedal edema, cyanosis, or clubbing.  NEUROLOGIC: Cranial nerves II through XII are intact. Muscle strength 5/5 in all extremities. Sensation intact. Gait not checked.  PSYCHIATRIC: The patient is alert and oriented x 3.  SKIN: No  obvious rash, lesion, or ulcer.   Data Review     CBC w Diff:  Lab Results  Component Value Date   WBC 22.3 (H) 03/14/2018   HGB 12.8 03/14/2018   HGB 11.9 05/11/2016   HCT 39.2 03/14/2018   HCT 36.7 05/11/2016   PLT 396 03/14/2018   PLT 302 05/11/2016   LYMPHOPCT 18 03/11/2018   MONOPCT 4 03/11/2018   EOSPCT 0 03/11/2018    BASOPCT 0 03/11/2018   CMP:  Lab Results  Component Value Date   NA 139 03/14/2018   NA 143 09/26/2016   NA 141 11/12/2013   K 4.3 03/14/2018   K 4.1 11/12/2013   CL 104 03/14/2018   CL 107 11/12/2013   CO2 27 03/14/2018   CO2 28 11/12/2013   BUN 20 03/14/2018   BUN 16 09/26/2016   BUN 9 11/12/2013   CREATININE 0.96 03/14/2018   CREATININE 1.08 11/12/2013   GLU 93 01/15/2014   PROT 7.1 09/26/2016   PROT 7.4 11/12/2013   ALBUMIN 4.4 09/26/2016   ALBUMIN 3.5 11/12/2013   BILITOT <0.2 09/26/2016   BILITOT < 0.1 (L) 11/12/2013   ALKPHOS 82 09/26/2016   ALKPHOS 90 11/12/2013   AST 15 09/26/2016   AST 22 11/12/2013   ALT 14 09/26/2016   ALT 26 11/12/2013  .  Micro Results No results found for this or any previous visit (from the past 240 hour(s)).      Code Status Orders  (From admission, onward)         Start     Ordered   03/12/18 0146  Full code  Continuous     03/12/18 0145        Code Status History    This patient has a current code status but no historical code status.          Follow-up Information    Laverle Hobby, MD. Go on 04/04/2018.   Specialty:  Pulmonary Disease Why:  @11 :30am Contact information: [address] [place] [address] [phone]        PCP. Schedule an appointment as soon as possible for a visit in 1 week.   Why:  Patient to make own follow up appt with PCP [facility]          Discharge Medications   Allergies as of 03/14/2018      Reactions   Flax Seed Oil [bio-flax] Hives   Pt. Self reported      Medication List    STOP taking these medications   predniSONE 20 MG tablet Commonly known as:  DELTASONE Replaced by:  predniSONE 10 MG (21) Tbpk tablet     TAKE these medications   ADVAIR DISKUS 500-50 MCG/DOSE Aepb Generic drug:  Fluticasone-Salmeterol INHALE 1 PUFF 2 TIMES A DAY. RINSE MOUTH AND SPIT AFTER EACH USE. What changed:  See the new  instructions.   aspirin EC 81 MG tablet Take 1 tablet (81 mg total) by mouth daily. No ibuprofen for at least one hour   azithromycin 500 MG tablet Commonly known as:  ZITHROMAX Take 1 tablet (500 mg total) by mouth daily for 3 days.   citalopram 20 MG tablet Commonly known as:  CELEXA Take 20 mg by mouth daily.   guaiFENesin 600 MG 12 hr tablet Commonly known as:  MUCINEX Take 1 tablet (600 mg total) by mouth 2 (two) times daily for 5 days.   guaiFENesin-dextromethorphan 100-10 MG/5ML syrup  Commonly known as:  ROBITUSSIN DM Take 5 mLs by mouth every 4 (four) hours as needed for cough.   ibuprofen 800 MG tablet Commonly known as:  ADVIL,MOTRIN Take 800 mg by mouth every 8 (eight) hours as needed for fever, headache or mild pain.   ipratropium-albuterol 0.5-2.5 (3) MG/3ML Soln Commonly known as:  DUONEB Take 3 mLs by nebulization every 6 (six) hours.   predniSONE 10 MG (21) Tbpk tablet Commonly known as:  STERAPRED UNI-PAK 21 TAB START AT 60MG  TAPER BY 10MG  UNTIL COMPLETE Replaces:  predniSONE 20 MG tablet   risperiDONE 2 MG tablet Commonly known as:  RISPERDAL Take 2 mg by mouth daily.   rosuvastatin 10 MG tablet Commonly known as:  CRESTOR Take 1 tablet (10 mg total) by mouth daily.   tiotropium 18 MCG inhalation capsule Commonly known as:  SPIRIVA HANDIHALER Place 1 capsule (18 mcg total) into inhaler and inhale daily.   varenicline 1 MG tablet Commonly known as:  CHANTIX CONTINUING MONTH PAK Take 1 tablet (1 mg total) by mouth 2 (two) times daily.   VENTOLIN HFA 108 (90 Base) MCG/ACT inhaler Generic drug:  albuterol INHALE 2 PUFFS EVERY 4 TO 6 HOURS AS NEEDED What changed:  See the new instructions.            Durable Medical Equipment  (From admission, onward)         Start     Ordered   03/14/18 0933  For home use only DME Nebulizer machine  Once    Question:  Patient needs a nebulizer to treat with the following condition  Answer:  COPD  (chronic obstructive pulmonary disease) ([place])   03/14/18 0933   03/14/18 0933  DME Oxygen  Once    Question Answer Comment  Mode or (Route) Nasal cannula   Liters per Minute 3   Oxygen delivery system Gas      03/14/18 0933             Total Time in preparing paper work, data evaluation and todays exam - 41 minutes  Dustin Flock M.D on 03/14/2018 at [place]  [phone]

## 2018-03-14 NOTE — Progress Notes (Signed)
SATURATION QUALIFICATIONS: (This note is used to comply with regulatory documentation for home oxygen)  Patient Saturations on Room Air at Rest = 93%  Patient Saturations on Room Air while Ambulating = 95%  Patient Saturations on  Liters of oxygen while Ambulating = %  Please briefly explain why patient needs home oxygen: Collier Bullock RN

## 2018-03-14 NOTE — Progress Notes (Signed)
[place] at [place] was admitted to the Hospital on 03/11/2018 and Discharged  03/14/2018 and should be excused from work/school   for 7 days starting 03/11/2018 , may return to work/school without any restrictions.  Call Dustin Flock MD with questions.  Dustin Flock M.D on 03/14/2018,at 9:56 AM  [place] [facility]  [phone]

## 2018-03-14 NOTE — Care Management (Signed)
RNCM received message that this patient had a need in order to go home.  I spoke with the patient to determine the need.  She stated that she did not have any shoes that her husband took them home with him.  I went to the Clothing closet and obtained a new pair of slip on shoes and provided them to the patient.  I alerted the nurse that the patient was ready for DC from a CM stand point

## 2018-03-14 NOTE — Progress Notes (Deleted)
Pt ambulated to restroom and sats went down to 90%. Pt sat at the edge of bed and used incentive spirometer with sats at 98%. Pt still tolerating 1L.

## 2018-03-18 ENCOUNTER — Telehealth: Payer: Self-pay

## 2018-03-18 NOTE — Telephone Encounter (Signed)
EMMI Follow-up: Noted on the report that the patient does not have a scheduled follow-up appointment. It is noted on the After Visit Summary that she has an appointment with Dr. Gayla Doss scheduled and needs to see MD at [facility]. Left a message on her voicemail to call me at her convenience.

## 2018-03-19 NOTE — Telephone Encounter (Signed)
EMMI follow-up: Second phone call made to Erica Gomez and left my contact information if she has any questions/concerns.

## 2018-03-21 ENCOUNTER — Telehealth: Payer: Self-pay

## 2018-03-21 NOTE — Telephone Encounter (Signed)
EMMI Follow-up: Erica Gomez left a voice message on my phone yesterday and I called her back but had to leave another message.  Left my contact information so she could call at her convenience if she had questions.

## 2018-03-22 ENCOUNTER — Telehealth: Payer: Self-pay

## 2018-03-22 NOTE — Telephone Encounter (Signed)
EMMI Follow-up: Erica Gomez called back as she received the second automated call.  Said she hasn't made her appointment yet [facility] but plans to prior to her follow-up appointment on 04/04/18. No other needs noted.

## 2018-04-02 NOTE — Progress Notes (Deleted)
The patient is a 53 year old female with a history of COPD, she had a recent exacerbation and 3-day admission to the hospital and January 2020.  **Chest x-ray 03/11/2018>> imaging personally reviewed, hyperinflation consistent with emphysema.

## 2018-04-04 ENCOUNTER — Inpatient Hospital Stay: Payer: Self-pay | Admitting: Internal Medicine

## 2018-04-04 ENCOUNTER — Telehealth: Payer: Self-pay | Admitting: Cardiovascular Disease

## 2018-04-04 ENCOUNTER — Other Ambulatory Visit: Payer: Self-pay | Admitting: Cardiovascular Disease

## 2018-04-04 NOTE — Telephone Encounter (Signed)
rosuvastatin (CRESTOR) 10 MG tablet 90 tablet 0 04/04/2018    Sig: TAKE ONE TABLET BY MOUTH EVERY DAY   Sent to pharmacy as: rosuvastatin (CRESTOR) 10 MG tablet   Notes to Pharmacy: Patient needs an appointment for further refills, Thanks ! [phone]   E-Prescribing Status: Sent to pharmacy (04/04/2018 9:25 AM EST)   Pharmacy   MEDICATION [facility]. [place], [place] [phone]

## 2018-04-04 NOTE — Telephone Encounter (Signed)
LMOV to schedule follow up 

## 2018-04-04 NOTE — Telephone Encounter (Signed)
-----   Message from Janan [place], [place] sent at 04/04/2018  9:24 AM EST ----- Regarding: Appointment for refills Patient needs an appointment for further refills,   Thanks!

## 2018-04-08 ENCOUNTER — Other Ambulatory Visit: Payer: Self-pay | Admitting: Cardiovascular Disease

## 2018-04-09 ENCOUNTER — Telehealth: Payer: Self-pay | Admitting: Pharmacy Technician

## 2018-04-09 NOTE — Telephone Encounter (Signed)
Patient asked that [facility] change name due to her recent marriage.  Made patient aware that we need spouse's income, tax return and bank statements.  [facility]

## 2018-04-25 NOTE — Telephone Encounter (Signed)
LMOV to schedule fu

## 2018-05-06 ENCOUNTER — Emergency Department
Admission: EM | Admit: 2018-05-06 | Discharge: 2018-05-06 | Disposition: A | Discharge status: Home / Self Care | Payer: Self-pay | Attending: Emergency Medicine | Admitting: Emergency Medicine

## 2018-05-06 ENCOUNTER — Other Ambulatory Visit: Payer: Self-pay

## 2018-05-06 DIAGNOSIS — Z79899 Other long term (current) drug therapy: Secondary | ICD-10-CM | POA: Insufficient documentation

## 2018-05-06 DIAGNOSIS — E119 Type 2 diabetes mellitus without complications: Secondary | ICD-10-CM | POA: Insufficient documentation

## 2018-05-06 DIAGNOSIS — J449 Chronic obstructive pulmonary disease, unspecified: Secondary | ICD-10-CM | POA: Insufficient documentation

## 2018-05-06 DIAGNOSIS — Z7689 Persons encountering health services in other specified circumstances: Secondary | ICD-10-CM | POA: Insufficient documentation

## 2018-05-06 DIAGNOSIS — F17210 Nicotine dependence, cigarettes, uncomplicated: Secondary | ICD-10-CM | POA: Insufficient documentation

## 2018-05-06 DIAGNOSIS — Z7982 Long term (current) use of aspirin: Secondary | ICD-10-CM | POA: Insufficient documentation

## 2018-05-06 DIAGNOSIS — I2510 Atherosclerotic heart disease of native coronary artery without angina pectoris: Secondary | ICD-10-CM | POA: Insufficient documentation

## 2018-05-06 NOTE — ED Triage Notes (Signed)
Pt arrives POV for Cough and HA. Onset today, employer sent her to be cleared before returning to work. Pt is afebrile. Denies any recent travel or exposure.

## 2018-05-06 NOTE — ED Provider Notes (Signed)
[facility] Emergency Department Provider Note  ____________________________________________   None    (approximate)  I have reviewed the triage vital signs and the nursing notes.   HISTORY  Chief Complaint Cough    HPI Erica Gomez is a 53 y.o. female that presents to the emergency department for a return to work note. Patient states that she has a chronic smokers cough that is not any different than normal. She states that she coughed and sneezed a couple of times at work today and her job is requesting a note to return to work. She took a couple of tylenol for a slight headache earlier, which has resolved. No fever, nasal congestion, sore throat, SOB, CP.     Past Medical History:  Diagnosis Date  . Allergy   . Anxiety   . Asthma   . Bipolar affective, manic ([place]) 1996  . COPD (chronic obstructive pulmonary disease) ([place])   . Depression   . HLD (hyperlipidemia) 03/12/2018  . Lung nodule, multiple 09/26/2016  . Manic depressive illness ([place]) 1996  . Seizures ([place])   . Substance abuse ([place])   . Tobacco abuse 10/04/2016    Patient Active Problem List   Diagnosis Date Noted  . Anxiety 03/12/2018  . COPD with acute exacerbation ([place]) 03/12/2018  . HLD (hyperlipidemia) 03/12/2018  . CAD (coronary artery disease), native coronary artery 02/23/2017  . Chest pain 02/22/2017  . Chest pain with moderate risk for cardiac etiology 02/22/2017  . Tobacco abuse 10/04/2016  . Lung nodule, multiple 09/26/2016  . Diabetes ([place]) 04/03/2014  . COPD (chronic obstructive pulmonary disease) ([place]) 12/17/2012  . Seizure ([place]) 12/17/2012    Past Surgical History:  Procedure Laterality Date  . ABDOMINAL HYSTERECTOMY    . TUBAL LIGATION      Prior to Admission medications   Medication Sig Start Date End Date Taking? Authorizing Provider  ADVAIR DISKUS 500-50 MCG/DOSE AEPB INHALE 1 PUFF 2 TIMES A DAY. RINSE MOUTH AND SPIT AFTER EACH USE. Patient taking  differently: Inhale 1 puff into the lungs 2 (two) times daily.  10/23/17   Tawni Millers, MD  aspirin EC 81 MG tablet Take 1 tablet (81 mg total) by mouth daily. No ibuprofen for at least one hour 09/26/16   Arnetha Courser, MD  citalopram (CELEXA) 20 MG tablet Take 20 mg by mouth daily.    [provider]  guaiFENesin-dextromethorphan (ROBITUSSIN DM) 100-10 MG/5ML syrup Take 5 mLs by mouth every 4 (four) hours as needed for cough. 03/14/18   Dustin Flock, MD  ibuprofen (ADVIL,MOTRIN) 800 MG tablet Take 800 mg by mouth every 8 (eight) hours as needed for fever, headache or mild pain.     [provider]  ipratropium-albuterol (DUONEB) 0.5-2.5 (3) MG/3ML SOLN Take 3 mLs by nebulization every 6 (six) hours. 03/14/18   Dustin Flock, MD  predniSONE (STERAPRED UNI-PAK 21 TAB) 10 MG (21) TBPK tablet START AT 60MG  TAPER BY 10MG  UNTIL COMPLETE 03/14/18   Dustin Flock, MD  risperiDONE (RISPERDAL) 2 MG tablet Take 2 mg by mouth daily.    [provider]  rosuvastatin (CRESTOR) 10 MG tablet TAKE ONE TABLET BY MOUTH EVERY DAY 04/08/18   Minna Merritts, MD  tiotropium (SPIRIVA HANDIHALER) 18 MCG inhalation capsule Place 1 capsule (18 mcg total) into inhaler and inhale daily. 03/14/18 03/14/19  Dustin Flock, MD  varenicline (CHANTIX CONTINUING MONTH PAK) 1 MG tablet Take 1 tablet (1 mg total) by mouth 2 (two) times  daily. 02/23/17   Gollan, Kathlene November, MD  VENTOLIN HFA 108 (90 Base) MCG/ACT inhaler INHALE 2 PUFFS EVERY 4 TO 6 HOURS AS NEEDED Patient taking differently: Inhale 2 puffs into the lungs every 4 (four) hours as needed for wheezing or shortness of breath.  09/10/17   Tawni Millers, MD    Allergies Flax seed oil [bio-flax]  Family History  Problem Relation Age of Onset  . Stroke Mother   . Heart attack Father     Social History Social History   Tobacco Use  . Smoking status: Current Every Day Smoker    Packs/day: 1.00    Years: 36.00    Pack years: 36.00     Types: Cigarettes  . Smokeless tobacco: Current User    Types: Chew, Snuff  . Tobacco comment: Using chew to wean herself off cigarettes  Substance Use Topics  . Alcohol use: No    Comment: "Occasionally"  . Drug use: No    Types: "Crack" cocaine    Comment: history 2013    Review of Systems Constitutional: No fever. ENT: No nasal congestion.  Cardiovascular: No chest pain. Respiratory: Chronic cough. No SOB.  Musculoskeletal: Negative for neck pain nor stiffness. Integumentary: Negative for rash. Neurological: No focal weakness nor numbness.   ____________________________________________   PHYSICAL EXAM:  VITAL SIGNS: ED Triage Vitals [05/06/18 1827]  Enc Vitals Group     BP 117/85     Pulse Rate 90     Resp 16     Temp 98 F (36.7 C)     Temp Source Oral     SpO2 100 %     Weight 186 lb (84.4 kg)     Height 5\' 6"  (1.676 m)     Head Circumference      Peak Flow      Pain Score 0     Pain Loc      Pain Edu?      Excl. in [place]?     Constitutional: Alert and oriented. Generally well appearing and in no acute distress. Eyes: Conjunctivae are normal.  Nose: No congestion. Neck: No stridor.  No meningeal signs.   Cardiovascular: Grossly normal heart sounds. Respiratory: Lungs CTAB. No tachypnea or retractions.  Musculoskeletal: No lower extremity tenderness nor edema. No gross deformities of extremities. Neurologic:  Normal speech and language. No gross focal neurologic deficits are appreciated.  Skin:  Skin is warm, dry and intact. No rash noted.   ____________________________________________   LABS (all labs ordered are listed, but only abnormal results are displayed)  Labs Reviewed - No data to display  ____________________________________________   RADIOLOGY   Official radiology report(s): No results found.  ____________________________________________    INITIAL IMPRESSION / MDM / ASSESSMENT AND PLAN / ED COURSE      Patient presented to  the emergency department for a return to work note. She states her cough is chronic and she feels well.  Patient's vital signs are stable.  This patient currently does not meet criteria for covid-19 testing and I explained that in detail to the patient.  The evaluation today is reassuring with no evidence of emergent medical condition that requires further work-up or evaluation or inpatient treatment.  I provided follow-up recommendations and strict return precautions.  The patient understands and agrees with the plan.   ____________________________________________  FINAL CLINICAL IMPRESSION(S) / ED DIAGNOSES  Final diagnoses:  Return to work evaluation        Note:  This document  was prepared using Systems analyst and may include unintentional dictation errors.   Laban Emperor, PA-C 05/06/18 [employer], [place], MD 05/06/18 2217

## 2018-05-16 NOTE — Telephone Encounter (Signed)
Spoke to patient about scheduling an evisit  Patient will consider and call back to schedule

## 2018-05-21 ENCOUNTER — Other Ambulatory Visit: Payer: Self-pay | Admitting: Internal Medicine

## 2018-05-22 ENCOUNTER — Other Ambulatory Visit: Payer: Self-pay | Admitting: Internal Medicine

## 2018-05-23 ENCOUNTER — Ambulatory Visit: Payer: Self-pay

## 2018-05-23 ENCOUNTER — Other Ambulatory Visit: Payer: Self-pay

## 2018-07-04 ENCOUNTER — Telehealth: Payer: Self-pay | Admitting: Pharmacist

## 2018-07-04 NOTE — Telephone Encounter (Signed)
07/04/2018 3:28:59 PM - Advair & Ventolin renewal & poi  07/04/2018 I am mailing patient her portion of [place] application for renewal-Advair & Ventolin, also a copy of notice Inez Catalina had provided to patient for 1 month check stubs for Ivin Boot[employer], [employer] for [employer] and last 30 days bank statements. I am taking scripts to [facility] for provider to sign.[name]

## 2018-07-10 NOTE — Telephone Encounter (Signed)
3 attempts to schedule closing encounter

## 2018-07-12 ENCOUNTER — Telehealth: Payer: Self-pay | Admitting: Pharmacist

## 2018-07-12 NOTE — Telephone Encounter (Signed)
07/12/2018 10:12:54 AM - Advair & Ventolin renewal pending  07/12/2018 I have received the signed scripts for Ventolin & Advair back from provider, holding for patient to return her portion mailed to her 07/04/2018.[name]

## 2018-08-01 ENCOUNTER — Telehealth: Payer: Self-pay | Admitting: Pharmacist

## 2018-08-01 NOTE — Telephone Encounter (Signed)
08/01/2018 1:55:22 PM - Ventolin HFA & Advair 500/50 to [place]  [id number]/[phone] Faxed [employer] application for renewal-Ventolin HFA 75mcg Inhale 2 puffs every 4-6 hours as needed & Advair 500/50 Inhale 1 puff twice a day, rinse mouth & spit after each use with household income.[name]

## 2018-10-23 ENCOUNTER — Other Ambulatory Visit: Payer: Self-pay

## 2018-10-23 ENCOUNTER — Encounter: Payer: Self-pay | Admitting: Emergency Medicine

## 2018-10-23 ENCOUNTER — Emergency Department
Admission: EM | Admit: 2018-10-23 | Discharge: 2018-10-24 | Disposition: A | Discharge status: Home / Self Care | Payer: Self-pay | Attending: Emergency Medicine | Admitting: Emergency Medicine

## 2018-10-23 DIAGNOSIS — J45909 Unspecified asthma, uncomplicated: Secondary | ICD-10-CM | POA: Diagnosis not present

## 2018-10-23 DIAGNOSIS — Z7982 Long term (current) use of aspirin: Secondary | ICD-10-CM | POA: Insufficient documentation

## 2018-10-23 DIAGNOSIS — G5603 Carpal tunnel syndrome, bilateral upper limbs: Secondary | ICD-10-CM | POA: Diagnosis not present

## 2018-10-23 DIAGNOSIS — J449 Chronic obstructive pulmonary disease, unspecified: Secondary | ICD-10-CM | POA: Insufficient documentation

## 2018-10-23 DIAGNOSIS — M79641 Pain in right hand: Secondary | ICD-10-CM

## 2018-10-23 DIAGNOSIS — E119 Type 2 diabetes mellitus without complications: Secondary | ICD-10-CM | POA: Diagnosis not present

## 2018-10-23 DIAGNOSIS — R202 Paresthesia of skin: Secondary | ICD-10-CM | POA: Diagnosis present

## 2018-10-23 DIAGNOSIS — F17210 Nicotine dependence, cigarettes, uncomplicated: Secondary | ICD-10-CM | POA: Insufficient documentation

## 2018-10-23 DIAGNOSIS — M79642 Pain in left hand: Secondary | ICD-10-CM

## 2018-10-23 DIAGNOSIS — I259 Chronic ischemic heart disease, unspecified: Secondary | ICD-10-CM | POA: Diagnosis not present

## 2018-10-23 MED ORDER — MELOXICAM 7.5 MG PO TABS: 7.5000 mg | ORAL_TABLET | Freq: Every day | ORAL | 0 refills | Status: DC

## 2018-10-23 MED ORDER — IBUPROFEN 600 MG PO TABS: 600.0000 mg | ORAL_TABLET | Freq: Once | ORAL | Status: DC

## 2018-10-23 MED ORDER — MELOXICAM 7.5 MG PO TABS
7.5000 mg | ORAL_TABLET | Freq: Once | ORAL | Status: AC
  Administered 2018-10-23: 7.5 mg via ORAL
  Filled 2018-10-23: qty 1

## 2018-10-23 MED ORDER — PREDNISONE 20 MG PO TABS: 60.0000 mg | ORAL_TABLET | Freq: Once | ORAL | Status: DC

## 2018-10-23 NOTE — ED Triage Notes (Signed)
Pt to triage states bilateral hand pain and reports hands are "locking up".  States has been happening for some time but today lasted over and hour.  Pt states she has job with repetitive motion of bilateral wrists.

## 2018-10-23 NOTE — ED Provider Notes (Signed)
[facility] Emergency Department Provider Note  ____________________________________________  Time seen: Approximately 10:54 PM  I have reviewed the triage vital signs and the nursing notes.   HISTORY  Chief Complaint Hand Pain    HPI Erica Gomez is a 53 y.o. female that presents to the emergency department for evaluation of bilateral hand pain, numbness, tingling for 1 year.  Occasionally she will have pain that shoots up into her forearm.  Pain will occasionally wake her up at night.  Patient uses her hands and wrists a lot for work.  She usually has pain while she is at work.  Sometimes when she is at work, her hands will cramp and she is able to massage the pain out.  No trauma.  No alleviating measures have been attempted.   Past Medical History:  Diagnosis Date  . Allergy   . Anxiety   . Asthma   . Bipolar affective, manic ([place]) 1996  . COPD (chronic obstructive pulmonary disease) ([place])   . Depression   . HLD (hyperlipidemia) 03/12/2018  . Lung nodule, multiple 09/26/2016  . Manic depressive illness ([place]) 1996  . Seizures ([place])   . Substance abuse ([place])   . Tobacco abuse 10/04/2016    Patient Active Problem List   Diagnosis Date Noted  . Anxiety 03/12/2018  . COPD with acute exacerbation ([place]) 03/12/2018  . HLD (hyperlipidemia) 03/12/2018  . CAD (coronary artery disease), native coronary artery 02/23/2017  . Chest pain 02/22/2017  . Chest pain with moderate risk for cardiac etiology 02/22/2017  . Tobacco abuse 10/04/2016  . Lung nodule, multiple 09/26/2016  . Diabetes ([place]) 04/03/2014  . COPD (chronic obstructive pulmonary disease) ([place]) 12/17/2012  . Seizure ([place]) 12/17/2012    Past Surgical History:  Procedure Laterality Date  . ABDOMINAL HYSTERECTOMY    . TUBAL LIGATION      Prior to Admission medications   Medication Sig Start Date End Date Taking? Authorizing Provider  aspirin EC 81 MG tablet Take 1 tablet (81 mg total) by  mouth daily. No ibuprofen for at least one hour 09/26/16   Arnetha Courser, MD  citalopram (CELEXA) 20 MG tablet Take 20 mg by mouth daily.    [provider]  Fluticasone-Salmeterol (ADVAIR DISKUS) 500-50 MCG/DOSE AEPB Inhale 1 puff into the lungs 2 (two) times daily. 05/22/18   Tawni Millers, MD  guaiFENesin-dextromethorphan (ROBITUSSIN DM) 100-10 MG/5ML syrup Take 5 mLs by mouth every 4 (four) hours as needed for cough. 03/14/18   Dustin Flock, MD  ibuprofen (ADVIL,MOTRIN) 800 MG tablet Take 800 mg by mouth every 8 (eight) hours as needed for fever, headache or mild pain.     [provider]  ipratropium-albuterol (DUONEB) 0.5-2.5 (3) MG/3ML SOLN Take 3 mLs by nebulization every 6 (six) hours. 03/14/18   Dustin Flock, MD  meloxicam (MOBIC) 7.5 MG tablet Take 1 tablet (7.5 mg total) by mouth daily. 10/23/18 10/23/19  Laban Emperor, PA-C  predniSONE (STERAPRED UNI-PAK 21 TAB) 10 MG (21) TBPK tablet START AT 60MG  TAPER BY 10MG  UNTIL COMPLETE 03/14/18   Dustin Flock, MD  risperiDONE (RISPERDAL) 2 MG tablet Take 2 mg by mouth daily.    [provider]  rosuvastatin (CRESTOR) 10 MG tablet TAKE ONE TABLET BY MOUTH EVERY DAY 04/08/18   Minna Merritts, MD  tiotropium (SPIRIVA HANDIHALER) 18 MCG inhalation capsule Place 1 capsule (18 mcg total) into inhaler and inhale daily. 03/14/18 03/14/19  Dustin Flock, MD  varenicline ([place]  PAK) 1 MG tablet Take 1 tablet (1 mg total) by mouth 2 (two) times daily. 02/23/17   Gollan, Kathlene November, MD  VENTOLIN HFA 108 (90 Base) MCG/ACT inhaler INHALE 2 PUFFS EVERY 4 TO 6 HOURS AS NEEDED Patient taking differently: Inhale 2 puffs into the lungs every 4 (four) hours as needed for wheezing or shortness of breath.  09/10/17   Tawni Millers, MD    Allergies Flax seed oil [bio-flax]  Family History  Problem Relation Age of Onset  . Stroke Mother   . Heart attack Father     Social History Social History   Tobacco Use   . Smoking status: Current Every Day Smoker    Packs/day: 1.00    Years: 36.00    Pack years: 36.00    Types: Cigarettes  . Smokeless tobacco: Current User    Types: Chew, Snuff  . Tobacco comment: Using chew to wean herself off cigarettes  Substance Use Topics  . Alcohol use: No    Comment: "Occasionally"  . Drug use: No    Types: "Crack" cocaine    Comment: history 2013     Review of Systems  Cardiovascular: No chest pain. Respiratory: No SOB. Gastrointestinal:  No nausea, no vomiting.  Musculoskeletal: Positive for hand pain. Skin: Negative for rash, abrasions, lacerations, ecchymosis. Neurological: Negative for headaches.  Positive for bilateral numbness and tingling.   ____________________________________________   PHYSICAL EXAM:  VITAL SIGNS: ED Triage Vitals  Enc Vitals Group     BP 10/23/18 2050 133/82     Pulse Rate 10/23/18 2050 87     Resp 10/23/18 2050 18     Temp 10/23/18 2050 97.7 F (36.5 C)     Temp Source 10/23/18 2050 Oral     SpO2 10/23/18 2050 96 %     Weight 10/23/18 2052 170 lb (77.1 kg)     Height 10/23/18 2052 5\' 6"  (1.676 m)     Head Circumference --      Peak Flow --      Pain Score --      Pain Loc --      Pain Edu? --      Excl. in [place]? --      Constitutional: Alert and oriented. Well appearing and in no acute distress. Eyes: Conjunctivae are normal. PERRL. EOMI. Head: Atraumatic. ENT:      Ears:      Nose: No congestion/rhinnorhea.      Mouth/Throat: Mucous membranes are moist.  Neck: No stridor.   Cardiovascular: Normal rate, regular rhythm.  Good peripheral circulation. Respiratory: Normal respiratory effort without tachypnea or retractions. Lungs CTAB. Good air entry to the bases with no decreased or absent breath sounds. Musculoskeletal: Full range of motion to all extremities. No gross deformities appreciated.  Grip strength intact.  No swelling.  No erythema.  Positive Tinel's and Phalen's. Neurologic:  Normal speech  and language. No gross focal neurologic deficits are appreciated.  Skin:  Skin is warm, dry and intact. No rash noted. Psychiatric: Mood and affect are normal. Speech and behavior are normal. Patient exhibits appropriate insight and judgement.   ____________________________________________   LABS (all labs ordered are listed, but only abnormal results are displayed)  Labs Reviewed - No data to display ____________________________________________  EKG   ____________________________________________  RADIOLOGY   No results found.  ____________________________________________    PROCEDURES  Procedure(s) performed:    Procedures    Medications  meloxicam (MOBIC) tablet 7.5 mg (7.5 mg Oral  Given 10/23/18 2307)     ____________________________________________   INITIAL IMPRESSION / ASSESSMENT AND PLAN / ED COURSE  Pertinent labs & imaging results that were available during my care of the patient were reviewed by me and considered in my medical decision making (see chart for details).  Review of the [place] CSRS was performed in accordance of the [place] prior to dispensing any controlled drugs.   Patient's diagnosis is consistent with carpal tunnel.  Bilateral wrist splints were given.  Patient dates that hands already feel better with splints on.  Patient will be discharged home with prescriptions for mobic. Patient is to follow up with hand Ortho as directed. Patient is given ED precautions to return to the ED for any worsening or new symptoms.   Erica Gomez was evaluated in Emergency Department on 10/23/2018 for the symptoms described in the history of present illness. She was evaluated in the context of the global COVID-19 pandemic, which necessitated consideration that the patient might be at risk for infection with the SARS-CoV-2 virus that causes COVID-19. Institutional protocols and algorithms that pertain to the evaluation of patients at risk for COVID-19 are in a  state of rapid change based on information released by regulatory bodies including the CDC and federal and state organizations. These policies and algorithms were followed during the patient's care in the ED.  ____________________________________________  FINAL CLINICAL IMPRESSION(S) / ED DIAGNOSES  Final diagnoses:  Bilateral carpal tunnel syndrome  Pain in both hands      NEW MEDICATIONS STARTED DURING THIS VISIT:  ED Discharge Orders         Ordered    meloxicam (MOBIC) 7.5 MG tablet  Daily     10/23/18 2330              This chart was dictated using voice recognition software/Dragon. Despite best efforts to proofread, errors can occur which can change the meaning. Any change was purely unintentional.    Laban Emperor, PA-C 10/23/18 2343    Carrie Mew, MD 10/24/18 2342

## 2018-10-24 ENCOUNTER — Telehealth: Payer: Self-pay | Admitting: Pharmacist

## 2018-10-24 NOTE — Telephone Encounter (Signed)
10/24/2018 12:19:04 PM - Ventolin & Advair 500/50 refills  10/24/2018 Placed refill online with [facility] for Ventolin [id number] & Advair 500/50 [id number], to ship 11/06/2018, order# [id number].Erica Gomez

## 2018-11-07 ENCOUNTER — Encounter: Payer: Self-pay | Admitting: Gerontology

## 2018-11-07 ENCOUNTER — Other Ambulatory Visit: Payer: Self-pay

## 2018-11-07 ENCOUNTER — Ambulatory Visit: Payer: Self-pay | Admitting: Gerontology

## 2018-11-07 VITALS — BP 111/67 | HR 100 | Temp 97.9°F | Ht 66.0 in | Wt 171.0 lb

## 2018-11-07 DIAGNOSIS — M79641 Pain in right hand: Secondary | ICD-10-CM | POA: Insufficient documentation

## 2018-11-07 DIAGNOSIS — M79642 Pain in left hand: Secondary | ICD-10-CM

## 2018-11-07 DIAGNOSIS — M542 Cervicalgia: Secondary | ICD-10-CM

## 2018-11-07 DIAGNOSIS — Z0000 Encounter for general adult medical examination without abnormal findings: Secondary | ICD-10-CM

## 2018-11-07 DIAGNOSIS — J439 Emphysema, unspecified: Secondary | ICD-10-CM

## 2018-11-07 MED ORDER — SPIRIVA HANDIHALER 18 MCG IN CAPS: 18.0000 ug | ORAL_CAPSULE | Freq: Every day | RESPIRATORY_TRACT | 11 refills | Status: DC

## 2018-11-07 MED ORDER — MELOXICAM 7.5 MG PO TABS: 7.5000 mg | ORAL_TABLET | Freq: Every day | ORAL | 0 refills | Status: DC

## 2018-11-07 MED ORDER — FLUTICASONE-SALMETEROL 500-50 MCG/DOSE IN AEPB: 1.0000 | INHALATION_SPRAY | Freq: Two times a day (BID) | RESPIRATORY_TRACT | 1 refills | Status: DC

## 2018-11-07 MED ORDER — ALBUTEROL SULFATE HFA 108 (90 BASE) MCG/ACT IN AERS: INHALATION_SPRAY | RESPIRATORY_TRACT | 0 refills | Status: DC

## 2018-11-07 NOTE — Patient Instructions (Signed)
Carbohydrate Counting for Diabetes Mellitus, Adult  Carbohydrate counting is a method of keeping track of how many carbohydrates you eat. Eating carbohydrates naturally increases the amount of sugar (glucose) in the blood. Counting how many carbohydrates you eat helps keep your blood glucose within normal limits, which helps you manage your diabetes (diabetes mellitus). It is important to know how many carbohydrates you can safely have in each meal. This is different for every person. A diet and nutrition specialist (registered dietitian) can help you make a meal plan and calculate how many carbohydrates you should have at each meal and snack. Carbohydrates are found in the following foods:  Grains, such as breads and cereals.  Dried beans and soy products.  Starchy vegetables, such as potatoes, peas, and corn.  Fruit and fruit juices.  Milk and yogurt.  Sweets and snack foods, such as cake, cookies, candy, chips, and soft drinks. How do I count carbohydrates? There are two ways to count carbohydrates in food. You can use either of the methods or a combination of both. Reading "Nutrition Facts" on packaged food The "Nutrition Facts" list is included on the labels of almost all packaged foods and beverages in the [place] It includes:  The serving size.  Information about nutrients in each serving, including the grams (g) of carbohydrate per serving. To use the "Nutrition Facts":  Decide how many servings you will have.  Multiply the number of servings by the number of carbohydrates per serving.  The resulting number is the total amount of carbohydrates that you will be having. Learning standard serving sizes of other foods When you eat carbohydrate foods that are not packaged or do not include "Nutrition Facts" on the label, you need to measure the servings in order to count the amount of carbohydrates:  Measure the foods that you will eat with a food scale or measuring cup, if needed.   Decide how many standard-size servings you will eat.  Multiply the number of servings by 15. Most carbohydrate-rich foods have about 15 g of carbohydrates per serving. ? For example, if you eat 8 oz (170 g) of strawberries, you will have eaten 2 servings and 30 g of carbohydrates (2 servings x 15 g = 30 g).  For foods that have more than one food mixed, such as soups and casseroles, you must count the carbohydrates in each food that is included. The following list contains standard serving sizes of common carbohydrate-rich foods. Each of these servings has about 15 g of carbohydrates:   hamburger bun or  English muffin.   oz (15 mL) syrup.   oz (14 g) jelly.  1 slice of bread.  1 six-inch tortilla.  3 oz (85 g) cooked rice or pasta.  4 oz (113 g) cooked dried beans.  4 oz (113 g) starchy vegetable, such as peas, corn, or potatoes.  4 oz (113 g) hot cereal.  4 oz (113 g) mashed potatoes or  of a large baked potato.  4 oz (113 g) canned or frozen fruit.  4 oz (120 mL) fruit juice.  4-6 crackers.  6 chicken nuggets.  6 oz (170 g) unsweetened dry cereal.  6 oz (170 g) plain fat-free yogurt or yogurt sweetened with artificial sweeteners.  8 oz (240 mL) milk.  8 oz (170 g) fresh fruit or one small piece of fruit.  24 oz (680 g) popped popcorn. Example of carbohydrate counting Sample meal  3 oz (85 g) chicken breast.  6 oz (170 g)   brown rice.  4 oz (113 g) corn.  8 oz (240 mL) milk.  8 oz (170 g) strawberries with sugar-free whipped topping. Carbohydrate calculation 1. Identify the foods that contain carbohydrates: ? Rice. ? Corn. ? Milk. ? Strawberries. 2. Calculate how many servings you have of each food: ? 2 servings rice. ? 1 serving corn. ? 1 serving milk. ? 1 serving strawberries. 3. Multiply each number of servings by 15 g: ? 2 servings rice x 15 g = 30 g. ? 1 serving corn x 15 g = 15 g. ? 1 serving milk x 15 g = 15 g. ? 1 serving  strawberries x 15 g = 15 g. 4. Add together all of the amounts to find the total grams of carbohydrates eaten: ? 30 g + 15 g + 15 g + 15 g = 75 g of carbohydrates total. Summary  Carbohydrate counting is a method of keeping track of how many carbohydrates you eat.  Eating carbohydrates naturally increases the amount of sugar (glucose) in the blood.  Counting how many carbohydrates you eat helps keep your blood glucose within normal limits, which helps you manage your diabetes.  A diet and nutrition specialist (registered dietitian) can help you make a meal plan and calculate how many carbohydrates you should have at each meal and snack. This information is not intended to replace advice given to you by your health care provider. Make sure you discuss any questions you have with your health care provider. Document Released: 01/30/2005 Document Revised: 08/24/2016 Document Reviewed: 07/14/2015 Elsevier Patient Education  2020 Elsevier Inc.  

## 2018-11-07 NOTE — Progress Notes (Signed)
Established Patient Office Visit  Subjective:  Patient ID: Erica Gomez, female    DOB: Sep 12, 1965  Age: 53 y.o. MRN: [id number]  CC:  Chief Complaint  Patient presents with  . Carpal Tunnel    bilateral, recently seen in ER    HPI Erica Gomez presents for follow up of bilateral arm pain, numbness and tingling that has being going on for more than one year. She was non compliant with her follow up appointments . She was evaluated at the ED on 10/23/2018 for bilateral hand pain, numbness, tingling and diagnoses was consistent with Bilateral carpal tunnel syndrome. She reports that taking 7.5 mg Meloxicam and wearing hand splint moderately relieved her symptoms. She states that the pain increases in intensity from 5-8 when she's using both hands at work. She reports that the pain originates from her thumb and radiates to her fore arm. She states that pain and numbness wakes her up at night and it's difficult making a fist. She also c/o intermittent dull pain to posterior neck that radiates to her right shoulder. She states that pain intensity increases from 4-6 when lying on her right side. She denies numbness and muscle weakness.   Her HgbA1c done on 03/12/2018 was 7 % and she never followed up for treatment. She reports experiencing polyuria, urinary frequency, but denies dysuria, urinary urgency, flank pain, fever and chills. Her blood glucose was checked during office visit and it was 87 mg/dl. She has a history of COPD, she's compliant with her medications, reports that her breathing is stable, but continues to smoke 1 pack of cigarette in 2-3 days and admits the desire to quit. She reports that she has not had mammogram, pap smear and colonoscopy done. She requests influenza vaccine, states that she' s doing well and offers no further concerns.  Past Medical History:  Diagnosis Date  . Allergy   . Anxiety   . Asthma   . Bipolar affective, manic ([place]) 1996  . COPD (chronic obstructive  pulmonary disease) ([place])   . Depression   . HLD (hyperlipidemia) 03/12/2018  . Lung nodule, multiple 09/26/2016  . Manic depressive illness ([place]) 1996  . Seizures ([place])   . Substance abuse ([place])   . Tobacco abuse 10/04/2016    Past Surgical History:  Procedure Laterality Date  . ABDOMINAL HYSTERECTOMY    . TUBAL LIGATION      Family History  Problem Relation Age of Onset  . Stroke Mother   . Heart attack Father     Social History   Socioeconomic History  . Marital status: Married    Spouse name: Not on file  . Number of children: Not on file  . Years of education: Not on file  . Highest education level: Not on file  Occupational History  . Not on file  Social Needs  . Financial resource strain: Not on file  . Food insecurity    Worry: Not on file    Inability: Not on file  . Transportation needs    Medical: Not on file    Non-medical: Not on file  Tobacco Use  . Smoking status: Current Every Day Smoker    Packs/day: 1.00    Years: 36.00    Pack years: 36.00    Types: Cigarettes  . Smokeless tobacco: Former Systems developer    Types: Chew, Snuff  . Tobacco comment: Using chew to wean herself off cigarettes  Substance and Sexual Activity  . Alcohol use: No  Comment: "Occasionally"  . Drug use: No    Types: "Crack" cocaine    Comment: history 2013  . Sexual activity: Yes    Birth control/protection: Condom, None  Lifestyle  . Physical activity    Days per week: Not on file    Minutes per session: Not on file  . Stress: Not on file  Relationships  . Social Herbalist on phone: Not on file    Gets together: Not on file    Attends religious service: Not on file    Active member of club or organization: Not on file    Attends meetings of clubs or organizations: Not on file    Relationship status: Not on file  . Intimate partner violence    Fear of current or ex partner: Not on file    Emotionally abused: Not on file    Physically abused: Not on file     Forced sexual activity: Not on file  Other Topics Concern  . Not on file  Social History Narrative  . Not on file    Outpatient Medications Prior to Visit  Medication Sig Dispense Refill  . aspirin EC 81 MG tablet Take 1 tablet (81 mg total) by mouth daily. No ibuprofen for at least one hour    . citalopram (CELEXA) 20 MG tablet Take 20 mg by mouth daily.    Marland Kitchen ipratropium-albuterol (DUONEB) 0.5-2.5 (3) MG/3ML SOLN Take 3 mLs by nebulization every 6 (six) hours. 360 mL 2  . risperiDONE (RISPERDAL) 2 MG tablet Take 2 mg by mouth daily.    . varenicline (CHANTIX CONTINUING MONTH PAK) 1 MG tablet Take 1 tablet (1 mg total) by mouth 2 (two) times daily. 60 tablet 3  . Fluticasone-Salmeterol (ADVAIR DISKUS) 500-50 MCG/DOSE AEPB Inhale 1 puff into the lungs 2 (two) times daily. 3 each 1  . meloxicam (MOBIC) 7.5 MG tablet Take 1 tablet (7.5 mg total) by mouth daily. 10 tablet 0  . tiotropium (SPIRIVA HANDIHALER) 18 MCG inhalation capsule Place 1 capsule (18 mcg total) into inhaler and inhale daily. 30 capsule 11  . VENTOLIN HFA 108 (90 Base) MCG/ACT inhaler INHALE 2 PUFFS EVERY 4 TO 6 HOURS AS NEEDED (Patient taking differently: Inhale 2 puffs into the lungs every 4 (four) hours as needed for wheezing or shortness of breath. ) 54 g 0  . guaiFENesin-dextromethorphan (ROBITUSSIN DM) 100-10 MG/5ML syrup Take 5 mLs by mouth every 4 (four) hours as needed for cough. 118 mL 0  . ibuprofen (ADVIL,MOTRIN) 800 MG tablet Take 800 mg by mouth every 8 (eight) hours as needed for fever, headache or mild pain.     . predniSONE (STERAPRED UNI-PAK 21 TAB) 10 MG (21) TBPK tablet START AT '60MG'$  TAPER BY '10MG'$  UNTIL COMPLETE 21 tablet 0  . rosuvastatin (CRESTOR) 10 MG tablet TAKE ONE TABLET BY MOUTH EVERY DAY (Patient not taking: Reported on 11/07/2018) 30 tablet 0   No facility-administered medications prior to visit.     Allergies  Allergen Reactions  . Flax Seed Oil [Bio-Flax] Hives    Pt. Self reported     ROS Review of Systems  Constitutional: Negative.   Eyes: Negative.   Respiratory: Negative.   Cardiovascular: Negative.   Gastrointestinal: Negative.   Endocrine: Positive for polyuria.  Genitourinary: Negative.   Musculoskeletal: Positive for neck pain.  Skin: Negative.   Neurological: Positive for numbness (bilateral fingers.).  Psychiatric/Behavioral: Negative.       Objective:    Physical  Exam  Constitutional: She is oriented to person, place, and time. She appears well-developed.  HENT:  Head: Normocephalic and atraumatic.  Eyes: Pupils are equal, round, and reactive to light. EOM are normal.  Neck: Normal range of motion.  Cardiovascular: Normal rate and regular rhythm.  Pulmonary/Chest: Effort normal and breath sounds normal.  Abdominal: Soft. Bowel sounds are normal.  Genitourinary:    Genitourinary Comments: Deferred per patient.   Musculoskeletal:        General: Tenderness (with palpation of wrist) present.  Neurological: She is alert and oriented to person, place, and time.  Skin: Skin is warm and dry.  Psychiatric: She has a normal mood and affect. Her behavior is normal. Judgment and thought content normal.    BP 111/67 (BP Location: Right Arm, Patient Position: Sitting)   Pulse 100   Temp 97.9 F (36.6 C)   Ht '5\' 6"'$  (1.676 m)   Wt 171 lb (77.6 kg)   BMI 27.60 kg/m  Wt Readings from Last 3 Encounters:  11/07/18 171 lb (77.6 kg)  10/23/18 170 lb (77.1 kg)  05/06/18 186 lb (84.4 kg)     Health Maintenance Due  Topic Date Due  . PNEUMOCOCCAL POLYSACCHARIDE VACCINE AGE 35-64 HIGH RISK  09/29/1967  . OPHTHALMOLOGY EXAM  09/29/1975  . URINE MICROALBUMIN  09/29/1975  . TETANUS/TDAP  09/28/1984  . PAP SMEAR-Modifier  09/29/1986  . MAMMOGRAM  09/29/2015  . COLONOSCOPY  09/29/2015  . FOOT EXAM  09/26/2017  . HEMOGLOBIN A1C  09/10/2018    There are no preventive care reminders to display for this patient.  Lab Results  Component Value Date    TSH 1.030 05/11/2016   Lab Results  Component Value Date   WBC 22.3 (H) 03/14/2018   HGB 12.8 03/14/2018   HCT 39.2 03/14/2018   MCV 91.2 03/14/2018   PLT 396 03/14/2018   Lab Results  Component Value Date   NA 139 03/14/2018   K 4.3 03/14/2018   CO2 27 03/14/2018   GLUCOSE 153 (H) 03/14/2018   BUN 20 03/14/2018   CREATININE 0.96 03/14/2018   BILITOT <0.2 09/26/2016   ALKPHOS 82 09/26/2016   AST 15 09/26/2016   ALT 14 09/26/2016   PROT 7.1 09/26/2016   ALBUMIN 4.4 09/26/2016   CALCIUM 9.3 03/14/2018   ANIONGAP 8 03/14/2018   Lab Results  Component Value Date   CHOL 172 09/26/2016   Lab Results  Component Value Date   HDL 43 09/26/2016   Lab Results  Component Value Date   LDLCALC 105 (H) 09/26/2016   Lab Results  Component Value Date   TRIG 121 09/26/2016   Lab Results  Component Value Date   CHOLHDL 4.0 09/26/2016   Lab Results  Component Value Date   HGBA1C 7.0 (H) 03/12/2018      Assessment & Plan:     1. Pulmonary emphysema, unspecified emphysema type ([place]) - She will continue on current treatment regimen and was strongly encouraged on smoking cessation. - Fluticasone-Salmeterol (ADVAIR DISKUS) 500-50 MCG/DOSE AEPB; Inhale 1 puff into the lungs 2 (two) times daily.  Dispense: 3 each; Refill: 1 - tiotropium (SPIRIVA HANDIHALER) 18 MCG inhalation capsule; Place 1 capsule (18 mcg total) into inhaler and inhale daily.  Dispense: 30 capsule; Refill: 11 - albuterol (VENTOLIN HFA) 108 (90 Base) MCG/ACT inhaler; INHALE 2 PUFFS EVERY 4 TO 6 HOURS AS NEEDED  Dispense: 54 g; Refill: 0  2. Bilateral hand pain - Ddx Carpel Tunnel Syndrome, she will continue  to take Meloxicam and wear hand splint while at work. She was encouraged to complete the charity care application for Neurology referral. - Ambulatory referral to Neurology - meloxicam (MOBIC) 7.5 MG tablet; Take 1 tablet (7.5 mg total) by mouth daily.  Dispense: 20 tablet; Refill: 0  3. Health care  maintenance - Routine labs will be checked. - HgB A1c; Future - Urine Microalbumin w/creat. ratio; Future - Lipid panel; Future - CBC w/Diff; Future - Comp Met (CMET); Future - Urinalysis; Future - Ambulatory referral to Hematology / Oncology for Mammogram and Pap smear. - Ambulatory referral to Gastroenterology for Colonoscopy - Flu Vaccine QUAD 6+ mos PF IM (Fluarix Quad PF) was administered.  4. Neck pain - She will continue Meloxicam and follow up with Dr Vickki Hearing. - meloxicam (MOBIC) 7.5 MG tablet; Take 1 tablet (7.5 mg total) by mouth daily.  Dispense: 20 tablet; Refill: 0 - Ambulatory referral to Orthopedic Surgery with Dr Vickki Hearing.   Follow-up: Return in about 2 weeks (around 11/21/2018), or if symptoms worsen or fail to improve.    Geneal Huebert Jerold Coom[place], NP

## 2018-11-13 ENCOUNTER — Other Ambulatory Visit: Payer: Self-pay

## 2018-11-13 DIAGNOSIS — Z0000 Encounter for general adult medical examination without abnormal findings: Secondary | ICD-10-CM

## 2018-11-14 ENCOUNTER — Other Ambulatory Visit: Payer: Self-pay

## 2018-11-14 ENCOUNTER — Telehealth: Payer: Self-pay

## 2018-11-14 DIAGNOSIS — Z1211 Encounter for screening for malignant neoplasm of colon: Secondary | ICD-10-CM

## 2018-11-14 LAB — MICROALBUMIN / CREATININE URINE RATIO
Creatinine, Urine: 109.8 mg/dL
Microalb/Creat Ratio: 4 mg/g creat (ref 0–29)
Microalbumin, Urine: 4.2 ug/mL

## 2018-11-14 LAB — COMPREHENSIVE METABOLIC PANEL
ALT: 14 IU/L (ref 0–32)
AST: 12 IU/L (ref 0–40)
Albumin/Globulin Ratio: 2 (ref 1.2–2.2)
Albumin: 4.3 g/dL (ref 3.8–4.9)
Alkaline Phosphatase: 72 IU/L (ref 39–117)
BUN/Creatinine Ratio: 13 (ref 9–23)
BUN: 14 mg/dL (ref 6–24)
Bilirubin Total: <0.2 mg/dL (ref 0.0–1.2)
CO2: 24 mmol/L (ref 20–29)
Calcium: 9.5 mg/dL (ref 8.7–10.2)
Chloride: 103 mmol/L (ref 96–106)
Creatinine, Ser: 1.11 mg/dL — ABNORMAL HIGH (ref 0.57–1.00)
GFR calc Af Amer: 66 mL/min/{1.73_m2} (ref >59)
GFR calc non Af Amer: 57 mL/min/{1.73_m2} — ABNORMAL LOW (ref >59)
Globulin, Total: 2.2 g/dL (ref 1.5–4.5)
Glucose: 124 mg/dL — ABNORMAL HIGH (ref 65–99)
Potassium: 4.4 mmol/L (ref 3.5–5.2)
Sodium: 142 mmol/L (ref 134–144)
Total Protein: 6.5 g/dL (ref 6.0–8.5)

## 2018-11-14 LAB — CBC WITH DIFFERENTIAL/PLATELET
Basophils Absolute: 0 10*3/uL (ref 0.0–0.2)
Basos: 0 %
EOS (ABSOLUTE): 0.3 10*3/uL (ref 0.0–0.4)
Eos: 4 %
Hematocrit: 38.3 % (ref 34.0–46.6)
Hemoglobin: 12.3 g/dL (ref 11.1–15.9)
Immature Grans (Abs): 0 10*3/uL (ref 0.0–0.1)
Immature Granulocytes: 0 %
Lymphocytes Absolute: 3.3 10*3/uL — ABNORMAL HIGH (ref 0.7–3.1)
Lymphs: 40 %
MCH: 29.4 pg (ref 26.6–33.0)
MCHC: 32.1 g/dL (ref 31.5–35.7)
MCV: 91 fL (ref 79–97)
Monocytes Absolute: 0.6 10*3/uL (ref 0.1–0.9)
Monocytes: 7 %
Neutrophils Absolute: 4.1 10*3/uL (ref 1.4–7.0)
Neutrophils: 49 %
Platelets: 308 10*3/uL (ref 150–450)
RBC: 4.19 x10E6/uL (ref 3.77–5.28)
RDW: 12.6 % (ref 11.7–15.4)
WBC: 8.4 10*3/uL (ref 3.4–10.8)

## 2018-11-14 LAB — LIPID PANEL
Chol/HDL Ratio: 3.8 ratio (ref 0.0–4.4)
Cholesterol, Total: 162 mg/dL (ref 100–199)
HDL: 43 mg/dL (ref >39)
LDL Chol Calc (NIH): 95 mg/dL (ref 0–99)
Triglycerides: 137 mg/dL (ref 0–149)
VLDL Cholesterol Cal: 24 mg/dL (ref 5–40)

## 2018-11-14 LAB — URINALYSIS
Bilirubin, UA: NEGATIVE
Glucose, UA: NEGATIVE
Ketones, UA: NEGATIVE
Leukocytes,UA: NEGATIVE
Nitrite, UA: NEGATIVE
Protein,UA: NEGATIVE
RBC, UA: NEGATIVE
Specific Gravity, UA: 1.017 (ref 1.005–1.030)
Urobilinogen, Ur: 0.2 mg/dL (ref 0.2–1.0)
pH, UA: 7.5 (ref 5.0–7.5)

## 2018-11-14 LAB — HEMOGLOBIN A1C
Est. average glucose Bld gHb Est-mCnc: 128 mg/dL
Hgb A1c MFr Bld: 6.1 % — ABNORMAL HIGH (ref 4.8–5.6)

## 2018-11-14 MED ORDER — NA SULFATE-K SULFATE-MG SULF 17.5-3.13-1.6 GM/177ML PO SOLN: 1.0000 | Freq: Once | ORAL | 0 refills | Status: AC

## 2018-11-14 NOTE — Telephone Encounter (Signed)
Gastroenterology Pre-Procedure Review  Request Date: 11/28/2018 Requesting Physician: Dr. Vicente Males  PATIENT REVIEW QUESTIONS: The patient responded to the following health history questions as indicated:    1. Are you having any GI issues? no 2. Do you have a personal history of Polyps? no 3. Do you have a family history of Colon Cancer or Polyps? no 4. Diabetes Mellitus? yes (controlled) 5. Joint replacements in the past 12 months?Hospitalized in Febuary 2020 for asthma  6. Major health problems in the past 3 months?no 7. Any artificial heart valves, MVP, or defibrillator?no    MEDICATIONS & ALLERGIES:    Patient reports the following regarding taking any anticoagulation/antiplatelet therapy:   Plavix, Coumadin, Eliquis, Xarelto, Lovenox, Pradaxa, Brilinta, or Effient? no Aspirin? no  Patient confirms/reports the following medications:  Current Outpatient Medications  Medication Sig Dispense Refill  . albuterol (VENTOLIN HFA) 108 (90 Base) MCG/ACT inhaler INHALE 2 PUFFS EVERY 4 TO 6 HOURS AS NEEDED 54 g 0  . aspirin EC 81 MG tablet Take 1 tablet (81 mg total) by mouth daily. No ibuprofen for at least one hour    . citalopram (CELEXA) 20 MG tablet Take 20 mg by mouth daily.    . Fluticasone-Salmeterol (ADVAIR DISKUS) 500-50 MCG/DOSE AEPB Inhale 1 puff into the lungs 2 (two) times daily. 3 each 1  . ipratropium-albuterol (DUONEB) 0.5-2.5 (3) MG/3ML SOLN Take 3 mLs by nebulization every 6 (six) hours. 360 mL 2  . meloxicam (MOBIC) 7.5 MG tablet Take 1 tablet (7.5 mg total) by mouth daily. 20 tablet 0  . risperiDONE (RISPERDAL) 2 MG tablet Take 2 mg by mouth daily.    Marland Kitchen tiotropium (SPIRIVA HANDIHALER) 18 MCG inhalation capsule Place 1 capsule (18 mcg total) into inhaler and inhale daily. 30 capsule 11  . varenicline (CHANTIX CONTINUING MONTH PAK) 1 MG tablet Take 1 tablet (1 mg total) by mouth 2 (two) times daily. 60 tablet 3   No current facility-administered medications for this visit.      Patient confirms/reports the following allergies:  Allergies  Allergen Reactions  . Flax Seed Oil [Bio-Flax] Hives    Pt. Self reported    No orders of the defined types were placed in this encounter.   AUTHORIZATION INFORMATION Primary Insurance: 1D#: Group #:  Secondary Insurance: 1D#: Group #:  SCHEDULE INFORMATION: Date: 11/28/2018 Time: Location: [place]

## 2018-11-21 ENCOUNTER — Ambulatory Visit: Payer: Self-pay | Admitting: Gerontology

## 2018-11-21 ENCOUNTER — Encounter: Payer: Self-pay | Admitting: Gerontology

## 2018-11-21 ENCOUNTER — Other Ambulatory Visit: Payer: Self-pay

## 2018-11-21 VITALS — BP 111/70 | HR 101 | Ht 66.0 in | Wt 177.0 lb

## 2018-11-21 DIAGNOSIS — Z0000 Encounter for general adult medical examination without abnormal findings: Secondary | ICD-10-CM

## 2018-11-21 DIAGNOSIS — R7303 Prediabetes: Secondary | ICD-10-CM | POA: Insufficient documentation

## 2018-11-21 DIAGNOSIS — M542 Cervicalgia: Secondary | ICD-10-CM

## 2018-11-21 DIAGNOSIS — M79642 Pain in left hand: Secondary | ICD-10-CM

## 2018-11-21 DIAGNOSIS — M79641 Pain in right hand: Secondary | ICD-10-CM

## 2018-11-21 MED ORDER — METFORMIN HCL 1000 MG PO TABS: 500.0000 mg | ORAL_TABLET | Freq: Every day | ORAL | 0 refills | Status: DC

## 2018-11-21 MED ORDER — GABAPENTIN 100 MG PO CAPS: 100.0000 mg | ORAL_CAPSULE | Freq: Three times a day (TID) | ORAL | 0 refills | Status: DC

## 2018-11-21 NOTE — Progress Notes (Signed)
Established Patient Office Visit  Subjective:  Patient ID: Erica Gomez, female    DOB: 1965-04-19  Age: 53 y.o. MRN: [id number]  CC: No chief complaint on file.   HPI Erica Gomez presents for follow up of bilateral hand pain, neck pain and lab review. She reports that the pain to her bilateral hand didn't improve with taking Meloxicam and her Job referred her to [facility] for the management of her bilateral hand pain, she is on Prednisone taper. She also endorses that she continues to experience intermittent dull 5/10 pain to posterior neck that radiates to her right shoulder. She reports that pain started after she slipped and landed on her back on her front porch few months ago. She states that pain is worst if she lays on her right side, and she denies muscle weakness.  Her HgbA1c done on 11/13/2018 improved from 7% 8 months ago to 6.1%, Lipid panel was within normal limits, Serum creatinine was 1.11 mg/dl and eGFR for non A A was 57. She states that her breathing is stable, compliant with her medications, continues to smoke one pack of cigarette daily and admits the desire to quit. She states that she's doing well and offers no further complaint.  Past Medical History:  Diagnosis Date  . Allergy   . Anxiety   . Asthma   . Bipolar affective, manic ([place]) 1996  . COPD (chronic obstructive pulmonary disease) ([place])   . Depression   . HLD (hyperlipidemia) 03/12/2018  . Lung nodule, multiple 09/26/2016  . Manic depressive illness ([place]) 1996  . Seizures ([place])   . Substance abuse ([place])   . Tobacco abuse 10/04/2016    Past Surgical History:  Procedure Laterality Date  . ABDOMINAL HYSTERECTOMY    . TUBAL LIGATION      Family History  Problem Relation Age of Onset  . Stroke Mother   . Heart attack Father     Social History   Socioeconomic History  . Marital status: Married    Spouse name: Not on file  . Number of children: Not on file  . Years of education: Not on file   . Highest education level: Not on file  Occupational History  . Not on file  Social Needs  . Financial resource strain: Not on file  . Food insecurity    Worry: Not on file    Inability: Not on file  . Transportation needs    Medical: Not on file    Non-medical: Not on file  Tobacco Use  . Smoking status: Current Every Day Smoker    Packs/day: 1.00    Years: 36.00    Pack years: 36.00    Types: Cigarettes  . Smokeless tobacco: Former Systems developer    Types: Chew, Snuff  . Tobacco comment: Using chew to wean herself off cigarettes  Substance and Sexual Activity  . Alcohol use: No    Comment: "Occasionally"  . Drug use: No    Types: "Crack" cocaine    Comment: history 2013  . Sexual activity: Yes    Birth control/protection: Condom, None  Lifestyle  . Physical activity    Days per week: Not on file    Minutes per session: Not on file  . Stress: Not on file  Relationships  . Social Herbalist on phone: Not on file    Gets together: Not on file    Attends religious service: Not on file    Active member of  club or organization: Not on file    Attends meetings of clubs or organizations: Not on file    Relationship status: Not on file  . Intimate partner violence    Fear of current or ex partner: Not on file    Emotionally abused: Not on file    Physically abused: Not on file    Forced sexual activity: Not on file  Other Topics Concern  . Not on file  Social History Narrative  . Not on file    Outpatient Medications Prior to Visit  Medication Sig Dispense Refill  . albuterol (VENTOLIN HFA) 108 (90 Base) MCG/ACT inhaler INHALE 2 PUFFS EVERY 4 TO 6 HOURS AS NEEDED 54 g 0  . aspirin EC 81 MG tablet Take 1 tablet (81 mg total) by mouth daily. No ibuprofen for at least one hour    . citalopram (CELEXA) 20 MG tablet Take 20 mg by mouth daily.    . Fluticasone-Salmeterol (ADVAIR DISKUS) 500-50 MCG/DOSE AEPB Inhale 1 puff into the lungs 2 (two) times daily. 3 each 1  .  ipratropium-albuterol (DUONEB) 0.5-2.5 (3) MG/3ML SOLN Take 3 mLs by nebulization every 6 (six) hours. 360 mL 2  . risperiDONE (RISPERDAL) 2 MG tablet Take 2 mg by mouth daily.    Marland Kitchen tiotropium (SPIRIVA HANDIHALER) 18 MCG inhalation capsule Place 1 capsule (18 mcg total) into inhaler and inhale daily. 30 capsule 11  . varenicline (CHANTIX CONTINUING MONTH PAK) 1 MG tablet Take 1 tablet (1 mg total) by mouth 2 (two) times daily. 60 tablet 3  . meloxicam (MOBIC) 7.5 MG tablet Take 1 tablet (7.5 mg total) by mouth daily. 20 tablet 0   No facility-administered medications prior to visit.     Allergies  Allergen Reactions  . Flax Seed Oil [Bio-Flax] Hives    Pt. Self reported    ROS Review of Systems  Constitutional: Negative.   Respiratory: Negative.   Cardiovascular: Negative.   Endocrine: Negative.   Genitourinary: Negative.   Musculoskeletal: Positive for neck pain.  Skin: Negative.   Neurological: Positive for numbness (intermittent numbness to bilateral hands).  Psychiatric/Behavioral: Negative.       Objective:    Physical Exam  Constitutional: She is oriented to person, place, and time. She appears well-developed.  HENT:  Head: Normocephalic and atraumatic.  Neck: Normal range of motion.  Cardiovascular: Normal rate and regular rhythm.  Pulmonary/Chest: Effort normal and breath sounds normal.  Musculoskeletal: Normal range of motion.  Neurological: She is alert and oriented to person, place, and time.  Skin: Skin is warm and dry.  Psychiatric: She has a normal mood and affect. Her behavior is normal. Judgment and thought content normal.    BP 111/70 (BP Location: Right Arm, Patient Position: Sitting)   Pulse (!) 101   Ht '5\' 6"'$  (1.676 m)   Wt 177 lb (80.3 kg)   BMI 28.57 kg/m  Wt Readings from Last 3 Encounters:  11/21/18 177 lb (80.3 kg)  11/07/18 171 lb (77.6 kg)  10/23/18 170 lb (77.1 kg)   She was encouraged to continue on weight loss regimen.  Health  Maintenance Due  Topic Date Due  . PNEUMOCOCCAL POLYSACCHARIDE VACCINE AGE 73-64 HIGH RISK  09/29/1967  . OPHTHALMOLOGY EXAM  09/29/1975  . TETANUS/TDAP  09/28/1984  . PAP SMEAR-Modifier  09/29/1986  . MAMMOGRAM  09/29/2015  . COLONOSCOPY  09/29/2015  . FOOT EXAM  09/26/2017    There are no preventive care reminders to display for this patient.  Lab Results  Component Value Date   TSH 1.030 05/11/2016   Lab Results  Component Value Date   WBC 8.4 11/13/2018   HGB 12.3 11/13/2018   HCT 38.3 11/13/2018   MCV 91 11/13/2018   PLT 308 11/13/2018   Lab Results  Component Value Date   NA 142 11/13/2018   K 4.4 11/13/2018   CO2 24 11/13/2018   GLUCOSE 124 (H) 11/13/2018   BUN 14 11/13/2018   CREATININE 1.11 (H) 11/13/2018   BILITOT <0.2 11/13/2018   ALKPHOS 72 11/13/2018   AST 12 11/13/2018   ALT 14 11/13/2018   PROT 6.5 11/13/2018   ALBUMIN 4.3 11/13/2018   CALCIUM 9.5 11/13/2018   ANIONGAP 8 03/14/2018   Lab Results  Component Value Date   CHOL 162 11/13/2018   Lab Results  Component Value Date   HDL 43 11/13/2018   Lab Results  Component Value Date   LDLCALC 95 11/13/2018   Lab Results  Component Value Date   TRIG 137 11/13/2018   Lab Results  Component Value Date   CHOLHDL 3.8 11/13/2018   Lab Results  Component Value Date   HGBA1C 6.1 (H) 11/13/2018      Assessment & Plan:     1. Bilateral hand pain - She will start gabapentin, was educated on medication side effects and advised to notify clinic. She will continue to follow up with [facility]. - gabapentin (NEURONTIN) 100 MG capsule; Take 1 capsule (100 mg total) by mouth 3 (three) times daily.  Dispense: 30 capsule; Refill: 0  2. Neck pain - She will continue on gabapentin and follow up with Ortho Dr Vickki Hearing  3. Prediabetes - She will start on metformin, was educated on side effects and advised to notify clinic. She was advised to continue on low carb/ non concentrated sweet diet,  exercise as tolerated. - metFORMIN (GLUCOPHAGE) 1000 MG tablet; Take 0.5 tablets (500 mg total) by mouth daily with breakfast.  Dispense: 30 tablet; Refill: 0  4. Health care maintenance - She was strongly advised on smoking cessation and [place] Quit line information was provided. - She was advised to increase water intake and will recheck  Basic Metabolic Panel (BMET); Future - Ambulatory referral to Hematology / Oncology for Mammogram and Pap smear.   Follow-up: Return in about 1 month (around 12/24/2018), or if symptoms worsen or fail to improve.    Ruston Fedora Jerold Coombe, NP

## 2018-11-21 NOTE — Patient Instructions (Signed)
Carbohydrate Counting for Diabetes Mellitus, Adult  Carbohydrate counting is a method of keeping track of how many carbohydrates you eat. Eating carbohydrates naturally increases the amount of sugar (glucose) in the blood. Counting how many carbohydrates you eat helps keep your blood glucose within normal limits, which helps you manage your diabetes (diabetes mellitus). It is important to know how many carbohydrates you can safely have in each meal. This is different for every person. A diet and nutrition specialist (registered dietitian) can help you make a meal plan and calculate how many carbohydrates you should have at each meal and snack. Carbohydrates are found in the following foods:  Grains, such as breads and cereals.  Dried beans and soy products.  Starchy vegetables, such as potatoes, peas, and corn.  Fruit and fruit juices.  Milk and yogurt.  Sweets and snack foods, such as cake, cookies, candy, chips, and soft drinks. How do I count carbohydrates? There are two ways to count carbohydrates in food. You can use either of the methods or a combination of both. Reading "Nutrition Facts" on packaged food The "Nutrition Facts" list is included on the labels of almost all packaged foods and beverages in the [place] It includes:  The serving size.  Information about nutrients in each serving, including the grams (g) of carbohydrate per serving. To use the "Nutrition Facts":  Decide how many servings you will have.  Multiply the number of servings by the number of carbohydrates per serving.  The resulting number is the total amount of carbohydrates that you will be having. Learning standard serving sizes of other foods When you eat carbohydrate foods that are not packaged or do not include "Nutrition Facts" on the label, you need to measure the servings in order to count the amount of carbohydrates:  Measure the foods that you will eat with a food scale or measuring cup, if needed.   Decide how many standard-size servings you will eat.  Multiply the number of servings by 15. Most carbohydrate-rich foods have about 15 g of carbohydrates per serving. ? For example, if you eat 8 oz (170 g) of strawberries, you will have eaten 2 servings and 30 g of carbohydrates (2 servings x 15 g = 30 g).  For foods that have more than one food mixed, such as soups and casseroles, you must count the carbohydrates in each food that is included. The following list contains standard serving sizes of common carbohydrate-rich foods. Each of these servings has about 15 g of carbohydrates:   hamburger bun or  English muffin.   oz (15 mL) syrup.   oz (14 g) jelly.  1 slice of bread.  1 six-inch tortilla.  3 oz (85 g) cooked rice or pasta.  4 oz (113 g) cooked dried beans.  4 oz (113 g) starchy vegetable, such as peas, corn, or potatoes.  4 oz (113 g) hot cereal.  4 oz (113 g) mashed potatoes or  of a large baked potato.  4 oz (113 g) canned or frozen fruit.  4 oz (120 mL) fruit juice.  4-6 crackers.  6 chicken nuggets.  6 oz (170 g) unsweetened dry cereal.  6 oz (170 g) plain fat-free yogurt or yogurt sweetened with artificial sweeteners.  8 oz (240 mL) milk.  8 oz (170 g) fresh fruit or one small piece of fruit.  24 oz (680 g) popped popcorn. Example of carbohydrate counting Sample meal  3 oz (85 g) chicken breast.  6 oz (170 g)   brown rice.  4 oz (113 g) corn.  8 oz (240 mL) milk.  8 oz (170 g) strawberries with sugar-free whipped topping. Carbohydrate calculation 1. Identify the foods that contain carbohydrates: ? Rice. ? Corn. ? Milk. ? Strawberries. 2. Calculate how many servings you have of each food: ? 2 servings rice. ? 1 serving corn. ? 1 serving milk. ? 1 serving strawberries. 3. Multiply each number of servings by 15 g: ? 2 servings rice x 15 g = 30 g. ? 1 serving corn x 15 g = 15 g. ? 1 serving milk x 15 g = 15 g. ? 1 serving  strawberries x 15 g = 15 g. 4. Add together all of the amounts to find the total grams of carbohydrates eaten: ? 30 g + 15 g + 15 g + 15 g = 75 g of carbohydrates total. Summary  Carbohydrate counting is a method of keeping track of how many carbohydrates you eat.  Eating carbohydrates naturally increases the amount of sugar (glucose) in the blood.  Counting how many carbohydrates you eat helps keep your blood glucose within normal limits, which helps you manage your diabetes.  A diet and nutrition specialist (registered dietitian) can help you make a meal plan and calculate how many carbohydrates you should have at each meal and snack. This information is not intended to replace advice given to you by your health care provider. Make sure you discuss any questions you have with your health care provider. Document Released: 01/30/2005 Document Revised: 08/24/2016 Document Reviewed: 07/14/2015 Elsevier Patient Education  2020 Elsevier Inc.  

## 2018-11-25 ENCOUNTER — Other Ambulatory Visit
Admission: RE | Admit: 2018-11-25 | Discharge: 2018-11-25 | Disposition: A | Discharge status: Home / Self Care | Payer: Self-pay | Source: Ambulatory Visit | Attending: Gastroenterology | Admitting: Gastroenterology

## 2018-11-25 DIAGNOSIS — Z20828 Contact with and (suspected) exposure to other viral communicable diseases: Secondary | ICD-10-CM | POA: Insufficient documentation

## 2018-11-25 DIAGNOSIS — K5790 Diverticulosis of intestine, part unspecified, without perforation or abscess without bleeding: Secondary | ICD-10-CM | POA: Insufficient documentation

## 2018-11-25 DIAGNOSIS — K635 Polyp of colon: Secondary | ICD-10-CM | POA: Insufficient documentation

## 2018-11-25 DIAGNOSIS — Z01812 Encounter for preprocedural laboratory examination: Secondary | ICD-10-CM | POA: Insufficient documentation

## 2018-11-25 LAB — SARS CORONAVIRUS 2 (TAT 6-24 HRS): SARS Coronavirus 2: NEGATIVE

## 2018-11-28 ENCOUNTER — Encounter
Admission: RE | Disposition: A | Discharge status: Home / Self Care | Payer: Self-pay | Source: Home / Self Care | Attending: Gastroenterology

## 2018-11-28 ENCOUNTER — Ambulatory Visit: Payer: Self-pay | Admitting: Anesthesiology

## 2018-11-28 ENCOUNTER — Other Ambulatory Visit: Payer: Self-pay

## 2018-11-28 ENCOUNTER — Ambulatory Visit
Admission: RE | Admit: 2018-11-28 | Discharge: 2018-11-28 | Disposition: A | Discharge status: Home / Self Care | Payer: Self-pay | Attending: Gastroenterology | Admitting: Gastroenterology

## 2018-11-28 DIAGNOSIS — R569 Unspecified convulsions: Secondary | ICD-10-CM | POA: Insufficient documentation

## 2018-11-28 DIAGNOSIS — R918 Other nonspecific abnormal finding of lung field: Secondary | ICD-10-CM | POA: Insufficient documentation

## 2018-11-28 DIAGNOSIS — F319 Bipolar disorder, unspecified: Secondary | ICD-10-CM | POA: Insufficient documentation

## 2018-11-28 DIAGNOSIS — Z823 Family history of stroke: Secondary | ICD-10-CM | POA: Insufficient documentation

## 2018-11-28 DIAGNOSIS — Z8249 Family history of ischemic heart disease and other diseases of the circulatory system: Secondary | ICD-10-CM | POA: Insufficient documentation

## 2018-11-28 DIAGNOSIS — Z90710 Acquired absence of both cervix and uterus: Secondary | ICD-10-CM | POA: Insufficient documentation

## 2018-11-28 DIAGNOSIS — J45909 Unspecified asthma, uncomplicated: Secondary | ICD-10-CM | POA: Insufficient documentation

## 2018-11-28 DIAGNOSIS — J449 Chronic obstructive pulmonary disease, unspecified: Secondary | ICD-10-CM | POA: Insufficient documentation

## 2018-11-28 DIAGNOSIS — F17210 Nicotine dependence, cigarettes, uncomplicated: Secondary | ICD-10-CM | POA: Insufficient documentation

## 2018-11-28 DIAGNOSIS — D122 Benign neoplasm of ascending colon: Secondary | ICD-10-CM | POA: Insufficient documentation

## 2018-11-28 DIAGNOSIS — Z7984 Long term (current) use of oral hypoglycemic drugs: Secondary | ICD-10-CM | POA: Insufficient documentation

## 2018-11-28 DIAGNOSIS — Z1211 Encounter for screening for malignant neoplasm of colon: Secondary | ICD-10-CM

## 2018-11-28 DIAGNOSIS — F419 Anxiety disorder, unspecified: Secondary | ICD-10-CM | POA: Insufficient documentation

## 2018-11-28 DIAGNOSIS — I2510 Atherosclerotic heart disease of native coronary artery without angina pectoris: Secondary | ICD-10-CM | POA: Insufficient documentation

## 2018-11-28 DIAGNOSIS — E785 Hyperlipidemia, unspecified: Secondary | ICD-10-CM | POA: Insufficient documentation

## 2018-11-28 DIAGNOSIS — Z7982 Long term (current) use of aspirin: Secondary | ICD-10-CM | POA: Insufficient documentation

## 2018-11-28 DIAGNOSIS — Z79899 Other long term (current) drug therapy: Secondary | ICD-10-CM | POA: Insufficient documentation

## 2018-11-28 DIAGNOSIS — K5730 Diverticulosis of large intestine without perforation or abscess without bleeding: Secondary | ICD-10-CM | POA: Insufficient documentation

## 2018-11-28 DIAGNOSIS — K635 Polyp of colon: Secondary | ICD-10-CM

## 2018-11-28 DIAGNOSIS — Z7951 Long term (current) use of inhaled steroids: Secondary | ICD-10-CM | POA: Insufficient documentation

## 2018-11-28 HISTORY — PX: COLONOSCOPY WITH PROPOFOL: SHX5780

## 2018-11-28 SURGERY — COLONOSCOPY WITH PROPOFOL
Anesthesia: General

## 2018-11-28 MED ORDER — EPHEDRINE SULFATE 50 MG/ML IJ SOLN
INTRAMUSCULAR | Status: AC
  Filled 2018-11-28: qty 1

## 2018-11-28 MED ORDER — SODIUM CHLORIDE 0.9 % IV SOLN
INTRAVENOUS | Status: DC
  Administered 2018-11-28: 09:00:00 via INTRAVENOUS

## 2018-11-28 MED ORDER — MIDAZOLAM HCL 2 MG/2ML IJ SOLN
INTRAMUSCULAR | Status: DC | PRN
  Administered 2018-11-28: 2 mg via INTRAVENOUS

## 2018-11-28 MED ORDER — LIDOCAINE HCL (PF) 1 % IJ SOLN
INTRAMUSCULAR | Status: AC
  Filled 2018-11-28: qty 2

## 2018-11-28 MED ORDER — LIDOCAINE HCL (CARDIAC) PF 100 MG/5ML IV SOSY
PREFILLED_SYRINGE | INTRAVENOUS | Status: DC | PRN
  Administered 2018-11-28: 40 mg via INTRAVENOUS

## 2018-11-28 MED ORDER — PROPOFOL 500 MG/50ML IV EMUL
INTRAVENOUS | Status: DC | PRN
  Administered 2018-11-28: 100 ug/kg/min via INTRAVENOUS

## 2018-11-28 MED ORDER — PROPOFOL 500 MG/50ML IV EMUL
INTRAVENOUS | Status: AC
  Filled 2018-11-28: qty 50

## 2018-11-28 MED ORDER — PROPOFOL 10 MG/ML IV BOLUS
INTRAVENOUS | Status: DC | PRN
  Administered 2018-11-28: 30 mg via INTRAVENOUS

## 2018-11-28 MED ORDER — MIDAZOLAM HCL 2 MG/2ML IJ SOLN
INTRAMUSCULAR | Status: AC
  Filled 2018-11-28: qty 2

## 2018-11-28 MED ORDER — LIDOCAINE HCL (PF) 2 % IJ SOLN
INTRAMUSCULAR | Status: AC
  Filled 2018-11-28: qty 10

## 2018-11-28 NOTE — Anesthesia Post-op Follow-up Note (Signed)
Anesthesia QCDR form completed.        

## 2018-11-28 NOTE — H&P (Signed)
Erica Bellows, MD [address], [place], [place], [place], [address] [address], [place], [place], [place], [address] Phone: [phone]  Fax: [phone]  Primary Care Physician:  Langston Reusing, NP   Pre-Procedure History & Physical: HPI:  Erica Gomez is a 53 y.o. female is here for an colonoscopy.   Past Medical History:  Diagnosis Date  . Allergy   . Anxiety   . Asthma   . Bipolar affective, manic ([place]) 1996  . COPD (chronic obstructive pulmonary disease) ([place])   . Depression   . HLD (hyperlipidemia) 03/12/2018  . Lung nodule, multiple 09/26/2016  . Manic depressive illness ([place]) 1996  . Seizures ([place])   . Substance abuse ([place])   . Tobacco abuse 10/04/2016    Past Surgical History:  Procedure Laterality Date  . ABDOMINAL HYSTERECTOMY    . TUBAL LIGATION      Prior to Admission medications   Medication Sig Start Date End Date Taking? Authorizing Provider  albuterol (VENTOLIN HFA) 108 (90 Base) MCG/ACT inhaler INHALE 2 PUFFS EVERY 4 TO 6 HOURS AS NEEDED 11/07/18  Yes Iloabachie, Chioma E, NP  aspirin EC 81 MG tablet Take 1 tablet (81 mg total) by mouth daily. No ibuprofen for at least one hour 09/26/16  Yes Lada, Satira Anis, MD  citalopram (CELEXA) 20 MG tablet Take 20 mg by mouth daily.   Yes [provider]  Fluticasone-Salmeterol (ADVAIR DISKUS) 500-50 MCG/DOSE AEPB Inhale 1 puff into the lungs 2 (two) times daily. 11/07/18  Yes Iloabachie, Chioma E, NP  gabapentin (NEURONTIN) 100 MG capsule Take 1 capsule (100 mg total) by mouth 3 (three) times daily. 11/21/18  Yes Iloabachie, Chioma E, NP  ipratropium-albuterol (DUONEB) 0.5-2.5 (3) MG/3ML SOLN Take 3 mLs by nebulization every 6 (six) hours. 03/14/18  Yes Dustin Flock, MD  metFORMIN (GLUCOPHAGE) 1000 MG tablet Take 0.5 tablets (500 mg total) by mouth daily with breakfast. 11/21/18  Yes Iloabachie, Chioma E, NP  risperiDONE (RISPERDAL) 2 MG tablet Take 2 mg by mouth daily.   Yes [provider]  tiotropium (SPIRIVA HANDIHALER) 18 MCG inhalation capsule Place 1 capsule (18 mcg total) into inhaler and inhale daily. 11/07/18 11/07/19 Yes Iloabachie, Chioma E, NP  varenicline (CHANTIX CONTINUING MONTH PAK) 1 MG tablet Take 1 tablet (1 mg total) by mouth 2 (two) times daily. 02/23/17  Yes Minna Merritts, MD    Allergies as of 11/14/2018 - Review Complete 11/07/2018  Allergen Reaction Noted  . Flax seed oil [bio-flax] Hives 10/05/2015    Family History  Problem Relation Age of Onset  . Stroke Mother   . Heart attack Father     Social History   Socioeconomic History  . Marital status: Married    Spouse name: Not on file  . Number of children: Not on file  . Years of education: Not on file  . Highest education level: Not on file  Occupational History  . Not on file  Social Needs  . Financial resource strain: Not on file  . Food insecurity    Worry: Not on file    Inability: Not on file  . Transportation needs    Medical: Not on file    Non-medical: Not on file  Tobacco Use  . Smoking status: Current Every Day Smoker    Packs/day: 1.00    Years: 36.00    Pack years: 36.00    Types: Cigarettes  . Smokeless tobacco: Former Systems developer  Types: Chew, Snuff  . Tobacco comment: Using chew to wean herself off cigarettes  Substance and Sexual Activity  . Alcohol use: No    Comment: "Occasionally"  . Drug use: No    Types: "Crack" cocaine    Comment: history 2013  . Sexual activity: Yes    Birth control/protection: Condom, None  Lifestyle  . Physical activity    Days per week: Not on file    Minutes per session: Not on file  . Stress: Not on file  Relationships  . Social Herbalist on phone: Not on file    Gets together: Not on file    Attends religious service: Not on file    Active member of club or organization: Not on file    Attends meetings of clubs or organizations: Not on file    Relationship status: Not on file  . Intimate partner  violence    Fear of current or ex partner: Not on file    Emotionally abused: Not on file    Physically abused: Not on file    Forced sexual activity: Not on file  Other Topics Concern  . Not on file  Social History Narrative  . Not on file    Review of Systems: See HPI, otherwise negative ROS  Physical Exam: BP 113/83   Pulse 89   Temp 97.6 F (36.4 C) (Tympanic)   Resp 16   Ht 5\' 6"  (1.676 m)   Wt 77.6 kg   SpO2 100%   BMI 27.60 kg/m  General:   Alert,  pleasant and cooperative in NAD Head:  Normocephalic and atraumatic. Neck:  Supple; no masses or thyromegaly. Lungs:  Clear throughout to auscultation, normal respiratory effort.    Heart:  +S1, +S2, Regular rate and rhythm, No edema. Abdomen:  Soft, nontender and nondistended. Normal bowel sounds, without guarding, and without rebound.   Neurologic:  Alert and  oriented x4;  grossly normal neurologically.  Impression/Plan: Erica Gomez is here for an colonoscopy to be performed for Screening colonoscopy average risk   Risks, benefits, limitations, and alternatives regarding  colonoscopy have been reviewed with the patient.  Questions have been answered.  All parties agreeable.   Erica Bellows, MD  11/28/2018, 8:39 AM

## 2018-11-28 NOTE — Anesthesia Postprocedure Evaluation (Signed)
Anesthesia Post Note  Patient: Erica Gomez  Procedure(s) Performed: COLONOSCOPY WITH PROPOFOL (N/A )  Patient location during evaluation: Endoscopy Anesthesia Type: General Level of consciousness: awake and alert Pain management: pain level controlled Vital Signs Assessment: post-procedure vital signs reviewed and stable Respiratory status: spontaneous breathing, nonlabored ventilation and respiratory function stable Cardiovascular status: blood pressure returned to baseline and stable Postop Assessment: no apparent nausea or vomiting Anesthetic complications: no     Last Vitals:  Vitals:   11/28/18 0947 11/28/18 0957  BP: 129/85 130/89  Pulse: 80 81  Resp: 16 16  Temp:    SpO2: 95% 97%    Last Pain:  Vitals:   11/28/18 0957  TempSrc:   PainSc: 0-No pain                 Alphonsus Sias

## 2018-11-28 NOTE — Op Note (Signed)
[facility] Gastroenterology Patient Name: Carah Arnwine Procedure Date: 11/28/2018 8:52 AM MRN: [id number] Account #: [id number] Date of Birth: 1966/01/27 Admit Type: Outpatient Age: 53 Room: [facility] ENDO ROOM 4 Gender: Female Note Status: Finalized Procedure:            Colonoscopy Indications:          Screening for colorectal malignant neoplasm Providers:            [name] MD, MD Referring MD:         No Local Md, MD (Referring MD) Medicines:            Monitored Anesthesia Care Complications:        No immediate complications. Procedure:            Pre-Anesthesia Assessment:                       - Prior to the procedure, a History and Physical was                        performed, and patient medications, allergies and                        sensitivities were reviewed. The patient's tolerance of                        previous anesthesia was reviewed.                       - The risks and benefits of the procedure and the                        sedation options and risks were discussed with the                        patient. All questions were answered and informed                        consent was obtained.                       - ASA Grade Assessment: II - A patient with mild                        systemic disease.                       After obtaining informed consent, the colonoscope was                        passed under direct vision. Throughout the procedure,                        the patient's blood pressure, pulse, and oxygen                        saturations were monitored continuously. The                        Colonoscope was introduced through the anus and  advanced to the the cecum, identified by the                        appendiceal orifice. The colonoscopy was performed                        without difficulty. The patient tolerated the procedure                        well. The quality of the bowel  preparation was                        excellent. Findings:      The perianal and digital rectal examinations were normal.      Three sessile polyps were found in the ascending colon. The polyps were       4 to 6 mm in size. These polyps were removed with a cold snare.       Resection and retrieval were complete.      Multiple small-mouthed diverticula were found in the entire colon.      The exam was otherwise without abnormality on direct and retroflexion       views. Impression:           - Three 4 to 6 mm polyps in the ascending colon,                        removed with a cold snare. Resected and retrieved.                       - Diverticulosis in the entire examined colon.                       - The examination was otherwise normal on direct and                        retroflexion views. Recommendation:       - Discharge patient to home (with escort).                       - Resume previous diet.                       - Continue present medications.                       - Await pathology results.                       - Repeat colonoscopy for surveillance based on                        pathology results. Procedure Code(s):    --- Professional ---                       [phone], Colonoscopy, flexible; with removal of tumor(s),                        polyp(s), or other lesion(s) by snare technique Diagnosis Code(s):    --- Professional ---  Z12.11, Encounter for screening for malignant neoplasm                        of colon                       K63.5, Polyp of colon                       K57.30, Diverticulosis of large intestine without                        perforation or abscess without bleeding CPT copyright 2019 American Medical Association. All rights reserved. The codes documented in this report are preliminary and upon coder review may  be revised to meet current compliance requirements. [name], MD [name] MD, MD 11/28/2018 9:15:35  AM This report has been signed electronically. Number of Addenda: 0 Note Initiated On: 11/28/2018 8:52 AM Scope Withdrawal Time: 0 hours 14 minutes 2 seconds  Total Procedure Duration: 0 hours 17 minutes 52 seconds  Estimated Blood Loss: Estimated blood loss: none.      [facility]

## 2018-11-28 NOTE — Transfer of Care (Signed)
Immediate Anesthesia Transfer of Care Note  Patient: Erica Gomez  Procedure(s) Performed: COLONOSCOPY WITH PROPOFOL (N/A )  Patient Location: PACU and Endoscopy Unit  Anesthesia Type:General  Level of Consciousness: sedated  Airway & Oxygen Therapy: Patient Spontanous Breathing  Post-op Assessment: Report given to RN and Post -op Vital signs reviewed and stable  Post vital signs: Reviewed and stable  Last Vitals:  Vitals Value Taken Time  BP    Temp    Pulse 88 11/28/18 0917  Resp 15 11/28/18 0917  SpO2 99 % 11/28/18 0917  Vitals shown include unvalidated device data.  Last Pain:  Vitals:   11/28/18 0812  TempSrc: Tympanic  PainSc: 0-No pain         Complications: No apparent anesthesia complications

## 2018-11-28 NOTE — Anesthesia Preprocedure Evaluation (Addendum)
Anesthesia Evaluation  Patient identified by MRN, date of birth, ID band Patient awake    Reviewed: Allergy & Precautions, H&P , NPO status , reviewed documented beta blocker date and time   Airway Mallampati: III  TM Distance: >3 FB Neck ROM: limited    Dental  (+) Caps, Teeth Intact   Pulmonary asthma , COPD, Current Smoker and Patient abstained from smoking.,    Pulmonary exam normal        Cardiovascular + CAD  Normal cardiovascular exam  ECHO 2015 Summary:   1. Left ventricular ejection fraction, by visual estimation, is 60 to  65%.   2. Normal global left ventricular systolic function.    Neuro/Psych Seizures -,  PSYCHIATRIC DISORDERS Anxiety Depression Bipolar Disorder    GI/Hepatic neg GERD  ,  Endo/Other  diabetes  Renal/GU      Musculoskeletal   Abdominal   Peds  Hematology   Anesthesia Other Findings Past Medical History: No date: Allergy No date: Anxiety No date: Asthma 1996: Bipolar affective, manic ([place]) No date: COPD (chronic obstructive pulmonary disease) ([place]) No date: Depression 03/12/2018: HLD (hyperlipidemia) 09/26/2016: Lung nodule, multiple 1996: Manic depressive illness ([place]) No date: Seizures ([place]) No date: Substance abuse ([place]) 10/04/2016: Tobacco abuse  Past Surgical History: No date: ABDOMINAL HYSTERECTOMY No date: TUBAL LIGATION     Reproductive/Obstetrics                            Anesthesia Physical Anesthesia Plan  ASA: III  Anesthesia Plan: General   Post-op Pain Management:    Induction: Intravenous  PONV Risk Score and Plan: Treatment may vary due to age or medical condition and TIVA  Airway Management Planned: Nasal Cannula and Natural Airway  Additional Equipment:   Intra-op Plan:   Post-operative Plan:   Informed Consent: I have reviewed the patients History and Physical, chart, labs and discussed the procedure including the  risks, benefits and alternatives for the proposed anesthesia with the patient or authorized representative who has indicated his/her understanding and acceptance.     Dental Advisory Given  Plan Discussed with: CRNA  Anesthesia Plan Comments:         Anesthesia Quick Evaluation

## 2018-11-29 ENCOUNTER — Encounter: Payer: Self-pay | Admitting: Gastroenterology

## 2018-11-29 LAB — SURGICAL PATHOLOGY

## 2018-12-01 ENCOUNTER — Encounter: Payer: Self-pay | Admitting: Gastroenterology

## 2018-12-03 ENCOUNTER — Ambulatory Visit: Payer: Self-pay | Admitting: Specialist

## 2018-12-03 ENCOUNTER — Telehealth: Payer: Self-pay | Admitting: Gerontology

## 2018-12-03 ENCOUNTER — Other Ambulatory Visit: Payer: Self-pay

## 2018-12-03 NOTE — Telephone Encounter (Signed)
Called patient on 10/20 @10 :00 am in regards to rescheduling appointment with provider. Patient arrived for appointment scheduled for 10/20 but provider was not in clinic. Made mistake of rescheduling for 11/3 instead on 11/10 and called patient about this. Asked patient to call back.

## 2018-12-17 ENCOUNTER — Ambulatory Visit: Payer: Self-pay | Admitting: Specialist

## 2018-12-18 ENCOUNTER — Other Ambulatory Visit: Payer: Self-pay

## 2018-12-18 DIAGNOSIS — Z0000 Encounter for general adult medical examination without abnormal findings: Secondary | ICD-10-CM

## 2018-12-19 LAB — BASIC METABOLIC PANEL
BUN/Creatinine Ratio: 10 (ref 9–23)
BUN: 11 mg/dL (ref 6–24)
CO2: 26 mmol/L (ref 20–29)
Calcium: 9.7 mg/dL (ref 8.7–10.2)
Chloride: 103 mmol/L (ref 96–106)
Creatinine, Ser: 1.08 mg/dL — ABNORMAL HIGH (ref 0.57–1.00)
GFR calc Af Amer: 68 mL/min/{1.73_m2} (ref >59)
GFR calc non Af Amer: 59 mL/min/{1.73_m2} — ABNORMAL LOW (ref >59)
Glucose: 125 mg/dL — ABNORMAL HIGH (ref 65–99)
Potassium: 4.5 mmol/L (ref 3.5–5.2)
Sodium: 140 mmol/L (ref 134–144)

## 2018-12-24 ENCOUNTER — Other Ambulatory Visit: Payer: Self-pay

## 2018-12-24 ENCOUNTER — Ambulatory Visit: Payer: Self-pay | Admitting: Gerontology

## 2018-12-24 ENCOUNTER — Ambulatory Visit (INDEPENDENT_AMBULATORY_CARE_PROVIDER_SITE_OTHER): Payer: Self-pay | Admitting: Neurology

## 2018-12-24 ENCOUNTER — Encounter: Payer: Self-pay | Admitting: Neurology

## 2018-12-24 VITALS — BP 139/90 | HR 88 | Temp 97.2°F | Ht 66.0 in | Wt 174.0 lb

## 2018-12-24 DIAGNOSIS — M79641 Pain in right hand: Secondary | ICD-10-CM | POA: Insufficient documentation

## 2018-12-24 DIAGNOSIS — M79642 Pain in left hand: Secondary | ICD-10-CM

## 2018-12-24 DIAGNOSIS — M542 Cervicalgia: Secondary | ICD-10-CM

## 2018-12-24 NOTE — Progress Notes (Signed)
PATIENT: Erica Gomez DOB: 10-27-1965  Chief Complaint  Patient presents with  . New Patient (Initial Visit)    New room with husband. Referring and PCP: Iloabachie, Chioma   . Pain    Bilateral hand pain since September. Pt is right handed but the pain is worse in the left. Starting with bilateral hand cramps that sent her to ER.     HISTORICAL  Erica Gomez is a 53 year old female, accompanied by her husband, seen in request by pain management NP Iloabachie, Chioma for evaluation of bilateral hands pain. Initial evaluation was on Dec 24 2018.  I have reviewed and summarized the referring note from the referring physician.  She has PMHx of bipolar disorder, has been current stable dose of celexa 20mg  daily and risperidone 2mg  qhs for many years.  She worked at [employer] over the past 5 years, which requires repetitive hand movement, wrist flexion, arm pronation, flexion.  She had intermittent bilateral hand joints pain, numbness, tingling since 2018, gradually getting worse, sometimes her hand lock up for few minutes, difficulty using her hands, sometimes wake up from sleep with all five fingers paresthesia, she has to shake her hands to make the sensation come back.  On Sept 25th 2020, her bilateral hands has painful spasm, locked up, it was difficulty for her to drive, she has not been able to go back to work since.  She was seen by orthopedics, reported normal x-ray of left wrist, she was treated with left wrist splint with mild improvement.  She also complains of chronic neck pain, radiating pain to both shoulders and arms.    REVIEW OF SYSTEMS: Full 14 system review of systems performed and notable only for as above All other review of systems were negative.  ALLERGIES: Allergies  Allergen Reactions  . Flax Seed Oil [Bio-Flax] Hives    Pt. Self reported    HOME MEDICATIONS: Current Outpatient Medications  Medication Sig Dispense Refill  . albuterol  (VENTOLIN HFA) 108 (90 Base) MCG/ACT inhaler INHALE 2 PUFFS EVERY 4 TO 6 HOURS AS NEEDED 54 g 0  . aspirin EC 81 MG tablet Take 1 tablet (81 mg total) by mouth daily. No ibuprofen for at least one hour    . citalopram (CELEXA) 20 MG tablet Take 20 mg by mouth daily.    . Fluticasone-Salmeterol (ADVAIR DISKUS) 500-50 MCG/DOSE AEPB Inhale 1 puff into the lungs 2 (two) times daily. 3 each 1  . gabapentin (NEURONTIN) 100 MG capsule Take 1 capsule (100 mg total) by mouth 3 (three) times daily. 30 capsule 0  . ipratropium-albuterol (DUONEB) 0.5-2.5 (3) MG/3ML SOLN Take 3 mLs by nebulization every 6 (six) hours. 360 mL 2  . metFORMIN (GLUCOPHAGE) 1000 MG tablet Take 0.5 tablets (500 mg total) by mouth daily with breakfast. 30 tablet 0  . risperiDONE (RISPERDAL) 2 MG tablet Take 2 mg by mouth daily.    Marland Kitchen tiotropium (SPIRIVA HANDIHALER) 18 MCG inhalation capsule Place 1 capsule (18 mcg total) into inhaler and inhale daily. 30 capsule 11  . varenicline (CHANTIX CONTINUING MONTH PAK) 1 MG tablet Take 1 tablet (1 mg total) by mouth 2 (two) times daily. 60 tablet 3   No current facility-administered medications for this visit.     PAST MEDICAL HISTORY: Past Medical History:  Diagnosis Date  . Allergy   . Anxiety   . Asthma   . Bipolar affective, manic ([place]) 1996  . COPD (chronic obstructive pulmonary disease) ([place])   .  Depression   . HLD (hyperlipidemia) 03/12/2018  . Lung nodule, multiple 09/26/2016  . Manic depressive illness ([place]) 1996  . Seizures ([place])   . Substance abuse ([place])   . Tobacco abuse 10/04/2016    PAST SURGICAL HISTORY: Past Surgical History:  Procedure Laterality Date  . ABDOMINAL HYSTERECTOMY    . COLONOSCOPY WITH PROPOFOL N/A 11/28/2018   Procedure: COLONOSCOPY WITH PROPOFOL;  Surgeon: [name], MD;  Location: [facility] ENDOSCOPY;  Service: Gastroenterology;  Laterality: N/A;  . TUBAL LIGATION      FAMILY HISTORY: Family History  Problem Relation Age of Onset  . Stroke  Mother   . Heart attack Father     SOCIAL HISTORY: Social History   Socioeconomic History  . Marital status: Married    Spouse name: Not on file  . Number of children: Not on file  . Years of education: Not on file  . Highest education level: Not on file  Occupational History  . Not on file  Social Needs  . Financial resource strain: Not on file  . Food insecurity    Worry: Not on file    Inability: Not on file  . Transportation needs    Medical: Not on file    Non-medical: Not on file  Tobacco Use  . Smoking status: Current Every Day Smoker    Packs/day: 1.00    Years: 36.00    Pack years: 36.00    Types: Cigarettes  . Smokeless tobacco: Former Systems developer    Types: Chew, Snuff  . Tobacco comment: Using chew to wean herself off cigarettes  Substance and Sexual Activity  . Alcohol use: No    Comment: "Occasionally"  . Drug use: No    Types: "Crack" cocaine    Comment: history 2013  . Sexual activity: Yes    Birth control/protection: Condom, None  Lifestyle  . Physical activity    Days per week: Not on file    Minutes per session: Not on file  . Stress: Not on file  Relationships  . Social Herbalist on phone: Not on file    Gets together: Not on file    Attends religious service: Not on file    Active member of club or organization: Not on file    Attends meetings of clubs or organizations: Not on file    Relationship status: Not on file  . Intimate partner violence    Fear of current or ex partner: Not on file    Emotionally abused: Not on file    Physically abused: Not on file    Forced sexual activity: Not on file  Other Topics Concern  . Not on file  Social History Narrative  . Not on file     PHYSICAL EXAM   Vitals:   12/24/18 0945  BP: 139/90  Pulse: 88  Temp: (!) 97.2 F (36.2 C)  Weight: 174 lb (78.9 kg)  Height: 5\' 6"  (1.676 m)    Not recorded      Body mass index is 28.08 kg/m.  PHYSICAL EXAMNIATION:  Gen: NAD,  conversant, well nourised, well groomed                     Cardiovascular: Regular rate rhythm, no peripheral edema, warm, nontender. Eyes: Conjunctivae clear without exudates or hemorrhage Neck: Supple, no carotid bruits. Pulmonary: Clear to auscultation bilaterally   NEUROLOGICAL EXAM:  MENTAL STATUS: Speech:    Speech is normal; fluent and spontaneous with  normal comprehension.  Cognition:     Orientation to time, place and person     Normal recent and remote memory     Normal Attention span and concentration     Normal Language, naming, repeating,spontaneous speech     Fund of knowledge   CRANIAL NERVES: CN II: Visual fields are full to confrontation.  Pupils are round equal and briskly reactive to light. CN III, IV, VI: extraocular movement are normal. No ptosis. CN V: Facial sensation is intact to pinprick in all 3 divisions bilaterally. Corneal responses are intact.  CN VII: Face is symmetric with normal eye closure and smile. CN VIII: Hearing is normal to causal conversation. CN IX, X: Palate elevates symmetrically. Phonation is normal. CN XI: Head turning and shoulder shrug are intact CN XII: Tongue is midline with normal movements and no atrophy.  MOTOR: There is no pronator drift of out-stretched arms. Muscle bulk and tone are normal. Muscle strength is normal.  REFLEXES: Reflexes are 2+ and symmetric at the biceps, triceps, knees, and ankles. Plantar responses are flexor.  SENSORY: Intact to light touch, pinprick, positional sensation and vibratory sensation are intact in fingers and toes.  COORDINATION: Rapid alternating movements and fine finger movements are intact. There is no dysmetria on finger-to-nose and heel-knee-shin.    GAIT/STANCE: Posture is normal. Gait is steady with normal steps, base, arm swing, and turning. Heel and toe walking are normal. Tandem gait is normal.  Romberg is absent.   DIAGNOSTIC DATA (LABS, IMAGING, TESTING) - I reviewed  patient records, labs, notes, testing and imaging myself where available.   ASSESSMENT AND PLAN  Erica Gomez is a 53 y.o. female   Bilateral hands paresthesia, pain Neck pain  DDx CTS vs cervical radiculopathy  EMG/NCS  MRI cervical spine  Add on Gabapentin 100mg  tid.   Marcial Pacas, M.D. Ph.D.  [facility] [address], [place], [place] [address] Ph: [phone] Fax: [phone]  CC: Referring Provider

## 2018-12-31 ENCOUNTER — Other Ambulatory Visit: Payer: Self-pay

## 2018-12-31 ENCOUNTER — Encounter (INDEPENDENT_AMBULATORY_CARE_PROVIDER_SITE_OTHER): Payer: Self-pay | Admitting: Neurology

## 2018-12-31 ENCOUNTER — Encounter: Payer: Self-pay | Admitting: Neurology

## 2018-12-31 ENCOUNTER — Ambulatory Visit (INDEPENDENT_AMBULATORY_CARE_PROVIDER_SITE_OTHER): Payer: Self-pay | Admitting: Neurology

## 2018-12-31 DIAGNOSIS — G5603 Carpal tunnel syndrome, bilateral upper limbs: Secondary | ICD-10-CM

## 2018-12-31 DIAGNOSIS — M79642 Pain in left hand: Secondary | ICD-10-CM

## 2018-12-31 DIAGNOSIS — Z0289 Encounter for other administrative examinations: Secondary | ICD-10-CM

## 2018-12-31 DIAGNOSIS — M79641 Pain in right hand: Secondary | ICD-10-CM

## 2018-12-31 HISTORY — DX: Carpal tunnel syndrome, bilateral upper limbs: G56.03

## 2018-12-31 NOTE — Procedures (Signed)
Full Name: Erica Gomez Gender: Female MRN #: [id number] Date of Birth: 1965/11/30    Visit Date: 12/31/2018 14:12 Age: 53 Years 3 Months Old Examining Physician: Marcial Pacas, MD  Referring Physician: Marcial Pacas, MD History: 53 year old female presented with intermittent bilateral hands paresthesia.  Summary of the tests: Nerve conduction study: Bilateral median sensory responses were absent.  Bilateral median motor responses showed significantly prolonged distal latency, right side also has mildly decreased CMAP amplitude.  Left ulnar sensory and motor responses were normal.  Right ulnar sensory response showed mildly decreased snap amplitude.  Right ulnar motor responses showed 12 m/s velocity drop across right elbow.  Bilateral radial sensory responses were normal.  Electromyography: Selected needle examination of bilateral upper extremity muscles were performed.  Bilateral abductor pollicis brevis showed mildly enlarged motor unit potential with mildly decreased recruitment patterns.  Conclusion: This is an abnormal study.  There is electrodiagnostic evidence of bilateral median neuropathy across the wrist, consistent with bilateral carpal tunnel syndromes.  Right side is moderate to severe, left side is moderate, demyelinating in nature, there is no evidence of axonal loss.    ------------------------------- Marcial Pacas, M.D. PhD  [facility] [place], [place] [address] Tel: [phone] Fax: [phone]        [facility]    Nerve / Sites Muscle Latency Ref. Amplitude Ref. Rel Amp Segments Distance Velocity Ref. Area    ms ms mV mV %  cm m/s m/s mVms  R Median - APB     Wrist APB 6.1 ?4.4 3.6 ?4.0 100 Wrist - APB 7   13.7     Upper arm APB 11.4  3.3  92.1 Upper arm - Wrist 21 40 ?49 13.3  L Median - APB     Wrist APB 6.8 ?4.4 5.1 ?4.0 100 Wrist - APB 7   18.7     Upper arm APB 11.5  4.4  85.6 Upper arm - Wrist 20 42 ?49 16.8  R Ulnar -  ADM     Wrist ADM 2.7 ?3.3 10.3 ?6.0 100 Wrist - ADM 7   28.9     B.Elbow ADM 6.5  8.6  83.1 B.Elbow - Wrist 19 50 ?49 27.1     A.Elbow ADM 9.2  7.8  90.3 A.Elbow - B.Elbow 10 38 ?49 25.4         A.Elbow - Wrist      L Ulnar - ADM     Wrist ADM 2.6 ?3.3 8.7 ?6.0 100 Wrist - ADM 7   28.4     B.Elbow ADM 6.0  7.6  87.5 B.Elbow - Wrist 18 52 ?49 24.9     A.Elbow ADM 8.0  7.3  96.6 A.Elbow - B.Elbow 10 52 ?49 26.2         A.Elbow - Wrist                 SNC    Nerve / Sites Rec. Site Peak Lat Ref.  Amp Ref. Segments Distance    ms ms V V  cm  R Radial - Anatomical snuff box (Forearm)     Forearm Wrist 2.4 ?2.9 18 ?15 Forearm - Wrist 10  L Radial - Anatomical snuff box (Forearm)     Forearm Wrist 2.6 ?2.9 16 ?15 Forearm - Wrist 10  R Median - Orthodromic (Dig II, Mid palm)     Dig II Wrist NR ?3.4 NR ?10 Dig II - Wrist 13  L Median - Orthodromic (Dig II, Mid palm)     Dig II Wrist NR ?3.4 NR ?10 Dig II - Wrist 13  R Ulnar - Orthodromic, (Dig V, Mid palm)     Dig V Wrist 2.8 ?3.1 4 ?5 Dig V - Wrist 11  L Ulnar - Orthodromic, (Dig V, Mid palm)     Dig V Wrist 2.7 ?3.1 7 ?5 Dig V - Wrist 42                 F  Wave    Nerve F Lat Ref.   ms ms  R Ulnar - ADM 30.1 ?32.0  L Ulnar - ADM 27.0 ?32.0         EMG       EMG Summary Table    Spontaneous MUAP Recruitment  Muscle IA Fib PSW Fasc Other Amp Dur. Poly Pattern  L. Abductor pollicis brevis Normal None None None _______ Normal Normal Normal Reduced  L. Pronator teres Normal None None None _______ Normal Normal Normal Normal  L. Biceps brachii Normal None None None _______ Normal Normal Normal Normal  L. Deltoid Normal None None None _______ Normal Normal Normal Normal  L. Extensor digitorum communis Normal None None None _______ Normal Normal Normal Normal  R. Abductor pollicis brevis Normal None None None _______ Normal Normal Normal Reduced

## 2019-01-01 ENCOUNTER — Telehealth: Payer: Self-pay | Admitting: Neurology

## 2019-01-01 ENCOUNTER — Ambulatory Visit: Payer: Self-pay | Admitting: Gerontology

## 2019-01-01 ENCOUNTER — Encounter: Payer: Self-pay | Admitting: Gerontology

## 2019-01-01 ENCOUNTER — Other Ambulatory Visit: Payer: Self-pay

## 2019-01-01 VITALS — BP 108/74 | HR 104 | Ht 66.0 in | Wt 171.0 lb

## 2019-01-01 DIAGNOSIS — Z0000 Encounter for general adult medical examination without abnormal findings: Secondary | ICD-10-CM

## 2019-01-01 DIAGNOSIS — R7303 Prediabetes: Secondary | ICD-10-CM

## 2019-01-01 DIAGNOSIS — M79641 Pain in right hand: Secondary | ICD-10-CM

## 2019-01-01 DIAGNOSIS — G5603 Carpal tunnel syndrome, bilateral upper limbs: Secondary | ICD-10-CM

## 2019-01-01 DIAGNOSIS — Z720 Tobacco use: Secondary | ICD-10-CM

## 2019-01-01 DIAGNOSIS — R918 Other nonspecific abnormal finding of lung field: Secondary | ICD-10-CM

## 2019-01-01 MED ORDER — METFORMIN HCL 1000 MG PO TABS: 500.0000 mg | ORAL_TABLET | Freq: Every day | ORAL | 3 refills | Status: DC

## 2019-01-01 MED ORDER — GABAPENTIN 100 MG PO CAPS: 100.0000 mg | ORAL_CAPSULE | Freq: Three times a day (TID) | ORAL | 2 refills | Status: DC

## 2019-01-01 NOTE — Patient Instructions (Signed)
Carbohydrate Counting for Diabetes Mellitus, Adult  Carbohydrate counting is a method of keeping track of how many carbohydrates you eat. Eating carbohydrates naturally increases the amount of sugar (glucose) in the blood. Counting how many carbohydrates you eat helps keep your blood glucose within normal limits, which helps you manage your diabetes (diabetes mellitus). It is important to know how many carbohydrates you can safely have in each meal. This is different for every person. A diet and nutrition specialist (registered dietitian) can help you make a meal plan and calculate how many carbohydrates you should have at each meal and snack. Carbohydrates are found in the following foods:  Grains, such as breads and cereals.  Dried beans and soy products.  Starchy vegetables, such as potatoes, peas, and corn.  Fruit and fruit juices.  Milk and yogurt.  Sweets and snack foods, such as cake, cookies, candy, chips, and soft drinks. How do I count carbohydrates? There are two ways to count carbohydrates in food. You can use either of the methods or a combination of both. Reading "Nutrition Facts" on packaged food The "Nutrition Facts" list is included on the labels of almost all packaged foods and beverages in the [place] It includes:  The serving size.  Information about nutrients in each serving, including the grams (g) of carbohydrate per serving. To use the "Nutrition Facts":  Decide how many servings you will have.  Multiply the number of servings by the number of carbohydrates per serving.  The resulting number is the total amount of carbohydrates that you will be having. Learning standard serving sizes of other foods When you eat carbohydrate foods that are not packaged or do not include "Nutrition Facts" on the label, you need to measure the servings in order to count the amount of carbohydrates:  Measure the foods that you will eat with a food scale or measuring cup, if needed.   Decide how many standard-size servings you will eat.  Multiply the number of servings by 15. Most carbohydrate-rich foods have about 15 g of carbohydrates per serving. ? For example, if you eat 8 oz (170 g) of strawberries, you will have eaten 2 servings and 30 g of carbohydrates (2 servings x 15 g = 30 g).  For foods that have more than one food mixed, such as soups and casseroles, you must count the carbohydrates in each food that is included. The following list contains standard serving sizes of common carbohydrate-rich foods. Each of these servings has about 15 g of carbohydrates:   hamburger bun or  English muffin.   oz (15 mL) syrup.   oz (14 g) jelly.  1 slice of bread.  1 six-inch tortilla.  3 oz (85 g) cooked rice or pasta.  4 oz (113 g) cooked dried beans.  4 oz (113 g) starchy vegetable, such as peas, corn, or potatoes.  4 oz (113 g) hot cereal.  4 oz (113 g) mashed potatoes or  of a large baked potato.  4 oz (113 g) canned or frozen fruit.  4 oz (120 mL) fruit juice.  4-6 crackers.  6 chicken nuggets.  6 oz (170 g) unsweetened dry cereal.  6 oz (170 g) plain fat-free yogurt or yogurt sweetened with artificial sweeteners.  8 oz (240 mL) milk.  8 oz (170 g) fresh fruit or one small piece of fruit.  24 oz (680 g) popped popcorn. Example of carbohydrate counting Sample meal  3 oz (85 g) chicken breast.  6 oz (170 g)   brown rice.  4 oz (113 g) corn.  8 oz (240 mL) milk.  8 oz (170 g) strawberries with sugar-free whipped topping. Carbohydrate calculation 1. Identify the foods that contain carbohydrates: ? Rice. ? Corn. ? Milk. ? Strawberries. 2. Calculate how many servings you have of each food: ? 2 servings rice. ? 1 serving corn. ? 1 serving milk. ? 1 serving strawberries. 3. Multiply each number of servings by 15 g: ? 2 servings rice x 15 g = 30 g. ? 1 serving corn x 15 g = 15 g. ? 1 serving milk x 15 g = 15 g. ? 1 serving  strawberries x 15 g = 15 g. 4. Add together all of the amounts to find the total grams of carbohydrates eaten: ? 30 g + 15 g + 15 g + 15 g = 75 g of carbohydrates total. Summary  Carbohydrate counting is a method of keeping track of how many carbohydrates you eat.  Eating carbohydrates naturally increases the amount of sugar (glucose) in the blood.  Counting how many carbohydrates you eat helps keep your blood glucose within normal limits, which helps you manage your diabetes.  A diet and nutrition specialist (registered dietitian) can help you make a meal plan and calculate how many carbohydrates you should have at each meal and snack. This information is not intended to replace advice given to you by your health care provider. Make sure you discuss any questions you have with your health care provider. Document Released: 01/30/2005 Document Revised: 08/24/2016 Document Reviewed: 07/14/2015 Elsevier Patient Education  2020 Elsevier Inc.  

## 2019-01-01 NOTE — Telephone Encounter (Addendum)
Sent via Epic patient has to be seen  Under the Merrilyn Puma because of patient assistance program insurance .    Dr. Krista Blue is this ok ?   This is Ok.  Marcial Pacas, M.D. Ph.D.  [facility] [place], [place] [address] Phone: [phone] Fax:      [phone]

## 2019-01-01 NOTE — Progress Notes (Signed)
Established Patient Office Visit  Subjective:  Patient ID: Erica Gomez, female    DOB: [birth year]  Age: 53 y.o. MRN: [id number]  CC:  Chief Complaint  Patient presents with  . Hand Pain    HPI TASCHA BATTE presents for follow up of bilateral hand paresthesia and lab review. She states that she continues to experience intermittent numbness to bilateral hands and taking gabapentin 100 mg tid minimally relieves symptoms. She had Nerve conduction study done on 12/31/2018 by Dr Darreld Mclean. Krista Blue and it was abnormal. There was electrodiagnostic evidence of bilateral median neuropathy across the wrist, consistent with bilateral carpal tunnel syndromes. Right side is moderate to severe, left side is moderate, demyelinating in nature, there is no evidence of axonal loss. She is compliant with her medications, adhering to healthy diet, and exercises as tolerated. She continues to smoke 1 pack of cigarette daily and she reports that she's using Chantix. She had a chest CT scan done in 2018 which showed stable small ground-glass opacities bilaterally, right more numerous than left, she denies shortness of breath and will follow up with Chest CT scan. She had Colonoscopy done on 11/28/2018 and three 4 to 6 mm polyps in the ascending colon was removed with a cold snare. Resected and retrieved. Diverticulosis in the entire examined colon and the examination was otherwise normal on direct and retroflexion views. Her Serum Creatinine done 2 weeks ago improved from 1.11 mg/dl to 1.08 mg/dl and GFR for non AA increased from 57 to 59. Her HgbA1c done 1 month ago was 6.1% and her blood glucose checked during visit was 89 mg/dl. She denies chest pain, palpitation, fever, and chills. She states that she's doing well and offers no further complaint.   Past Medical History:  Diagnosis Date  . Allergy   . Anxiety   . Asthma   . Bilateral carpal tunnel syndrome 12/31/2018  . Bipolar affective, manic ([place]) 1996  . COPD  (chronic obstructive pulmonary disease) ([place])   . Depression   . HLD (hyperlipidemia) 03/12/2018  . Lung nodule, multiple 09/26/2016  . Manic depressive illness ([place]) 1996  . Seizures ([place])   . Substance abuse ([place])   . Tobacco abuse 10/04/2016    Past Surgical History:  Procedure Laterality Date  . ABDOMINAL HYSTERECTOMY    . COLONOSCOPY WITH PROPOFOL N/A 11/28/2018   Procedure: COLONOSCOPY WITH PROPOFOL;  Surgeon: [name], MD;  Location: [facility] ENDOSCOPY;  Service: Gastroenterology;  Laterality: N/A;  . TUBAL LIGATION      Family History  Problem Relation Age of Onset  . Stroke Mother   . Heart attack Father     Social History   Socioeconomic History  . Marital status: Married    Spouse name: Not on file  . Number of children: Not on file  . Years of education: Not on file  . Highest education level: Not on file  Occupational History  . Not on file  Social Needs  . Financial resource strain: Not on file  . Food insecurity    Worry: Not on file    Inability: Not on file  . Transportation needs    Medical: Not on file    Non-medical: Not on file  Tobacco Use  . Smoking status: Current Every Day Smoker    Packs/day: 1.00    Years: 36.00    Pack years: 36.00    Types: Cigarettes  . Smokeless tobacco: Former Systems developer    Types: Chew, Snuff  . Tobacco  comment: Using chew to wean herself off cigarettes  Substance and Sexual Activity  . Alcohol use: No    Comment: "Occasionally"  . Drug use: No    Types: "Crack" cocaine    Comment: history 2013  . Sexual activity: Yes    Birth control/protection: Condom, None  Lifestyle  . Physical activity    Days per week: Not on file    Minutes per session: Not on file  . Stress: Not on file  Relationships  . Social Herbalist on phone: Not on file    Gets together: Not on file    Attends religious service: Not on file    Active member of club or organization: Not on file    Attends meetings of clubs or  organizations: Not on file    Relationship status: Not on file  . Intimate partner violence    Fear of current or ex partner: Not on file    Emotionally abused: Not on file    Physically abused: Not on file    Forced sexual activity: Not on file  Other Topics Concern  . Not on file  Social History Narrative  . Not on file    Outpatient Medications Prior to Visit  Medication Sig Dispense Refill  . albuterol (VENTOLIN HFA) 108 (90 Base) MCG/ACT inhaler INHALE 2 PUFFS EVERY 4 TO 6 HOURS AS NEEDED 54 g 0  . aspirin EC 81 MG tablet Take 1 tablet (81 mg total) by mouth daily. No ibuprofen for at least one hour    . citalopram (CELEXA) 20 MG tablet Take 20 mg by mouth daily.    . Fluticasone-Salmeterol (ADVAIR DISKUS) 500-50 MCG/DOSE AEPB Inhale 1 puff into the lungs 2 (two) times daily. 3 each 1  . ipratropium-albuterol (DUONEB) 0.5-2.5 (3) MG/3ML SOLN Take 3 mLs by nebulization every 6 (six) hours. 360 mL 2  . risperiDONE (RISPERDAL) 2 MG tablet Take 2 mg by mouth daily.    Marland Kitchen tiotropium (SPIRIVA HANDIHALER) 18 MCG inhalation capsule Place 1 capsule (18 mcg total) into inhaler and inhale daily. 30 capsule 11  . varenicline (CHANTIX CONTINUING MONTH PAK) 1 MG tablet Take 1 tablet (1 mg total) by mouth 2 (two) times daily. 60 tablet 3  . gabapentin (NEURONTIN) 100 MG capsule Take 1 capsule (100 mg total) by mouth 3 (three) times daily. 30 capsule 0  . metFORMIN (GLUCOPHAGE) 1000 MG tablet Take 0.5 tablets (500 mg total) by mouth daily with breakfast. 30 tablet 0   No facility-administered medications prior to visit.     Allergies  Allergen Reactions  . Flax Seed Oil [Bio-Flax] Hives    Pt. Self reported    ROS Review of Systems  Constitutional: Negative.   Respiratory: Negative.   Cardiovascular: Negative.   Endocrine: Negative.   Genitourinary: Negative.   Skin: Negative.   Neurological: Positive for numbness (Paresthesia to bilateral hands.).  Psychiatric/Behavioral: Negative.        Objective:    Physical Exam  Constitutional: She is oriented to person, place, and time. She appears well-developed.  HENT:  Head: Normocephalic and atraumatic.  Eyes: Pupils are equal, round, and reactive to light. EOM are normal.  Cardiovascular: Normal rate and regular rhythm.  Pulmonary/Chest: Effort normal and breath sounds normal.  Neurological: She is alert and oriented to person, place, and time.  Skin: Skin is warm and dry.  Psychiatric: She has a normal mood and affect. Her behavior is normal. Judgment and thought content normal.  BP 108/74 (BP Location: Left Arm, Patient Position: Sitting)   Pulse (!) 104   Ht 5\' 6"  (1.676 m)   Wt 171 lb (77.6 kg)   SpO2 95%   BMI 27.60 kg/m  Wt Readings from Last 3 Encounters:  01/01/19 171 lb (77.6 kg)  12/24/18 174 lb (78.9 kg)  11/28/18 171 lb (77.6 kg)   She lost 3 pounds and was encouraged to continue on her weight loss regimen.   Health Maintenance Due  Topic Date Due  . PNEUMOCOCCAL POLYSACCHARIDE VACCINE AGE 64-64 HIGH RISK  09/29/1967  . OPHTHALMOLOGY EXAM  09/29/1975  . TETANUS/TDAP  09/28/1984  . PAP SMEAR-Modifier  09/29/1986  . MAMMOGRAM  09/29/2015    There are no preventive care reminders to display for this patient.  Lab Results  Component Value Date   TSH 1.030 05/11/2016   Lab Results  Component Value Date   WBC 8.4 11/13/2018   HGB 12.3 11/13/2018   HCT 38.3 11/13/2018   MCV 91 11/13/2018   PLT 308 11/13/2018   Lab Results  Component Value Date   NA 140 12/18/2018   K 4.5 12/18/2018   CO2 26 12/18/2018   GLUCOSE 125 (H) 12/18/2018   BUN 11 12/18/2018   CREATININE 1.08 (H) 12/18/2018   BILITOT <0.2 11/13/2018   ALKPHOS 72 11/13/2018   AST 12 11/13/2018   ALT 14 11/13/2018   PROT 6.5 11/13/2018   ALBUMIN 4.3 11/13/2018   CALCIUM 9.7 12/18/2018   ANIONGAP 8 03/14/2018   Lab Results  Component Value Date   CHOL 162 11/13/2018   Lab Results  Component Value Date   HDL 43  11/13/2018   Lab Results  Component Value Date   LDLCALC 95 11/13/2018   Lab Results  Component Value Date   TRIG 137 11/13/2018   Lab Results  Component Value Date   CHOLHDL 3.8 11/13/2018   Lab Results  Component Value Date   HGBA1C 6.1 (H) 11/13/2018      Assessment & Plan:     1. Bilateral carpal tunnel syndrome - She will continue on current treatment regimen, and will continue to follow up with Dr Darreld Mclean. Krista Blue. - gabapentin (NEURONTIN) 100 MG capsule; Take 1 capsule (100 mg total) by mouth 3 (three) times daily.  Dispense: 30 capsule; Refill: 2  2. Lung nodule, multiple - She will follow up at - AMB  Referral to Pulmonary Nodule Clinic for continuous monitoring.  3. Prediabetes - She was encouraged to continue on low carb/non concentrated sweet diet and will follow up for  - Ambulatory referral to Ophthalmology. She will continue on current treatment regimen. - metFORMIN (GLUCOPHAGE) 1000 MG tablet; Take 0.5 tablets (500 mg total) by mouth daily with breakfast.  Dispense: 30 tablet; Refill: 3 - Basic Metabolic Panel (BMET); Future - HgB A1c; Future  4. Tobacco abuse - She was strongly encouraged on smoking cessation.  5. Health care maintenance  - Pneumococcal polysaccharide vaccine 23-valent greater than or equal to 2yo subcutaneous/IM was administered.    Follow-up: Return in about 3 months (around 04/08/2019), or if symptoms worsen or fail to improve.    Alaja Goldinger Jerold Coombe, NP

## 2019-01-01 NOTE — Telephone Encounter (Signed)
Called and spoke patient - patient is scheduled for MRI at [facility] arrive at 6:30 pm for 7:00 apt  [phone] . Patient is aware.

## 2019-01-06 ENCOUNTER — Telehealth: Payer: Self-pay | Admitting: Neurology

## 2019-01-06 NOTE — Telephone Encounter (Signed)
Faxed to [facility] 01/06/19

## 2019-01-07 ENCOUNTER — Encounter: Payer: Self-pay | Admitting: Orthopaedic Surgery

## 2019-01-07 ENCOUNTER — Other Ambulatory Visit: Payer: Self-pay

## 2019-01-07 ENCOUNTER — Ambulatory Visit (INDEPENDENT_AMBULATORY_CARE_PROVIDER_SITE_OTHER): Payer: Self-pay | Admitting: Orthopaedic Surgery

## 2019-01-07 ENCOUNTER — Other Ambulatory Visit: Payer: Self-pay | Admitting: Oncology

## 2019-01-07 DIAGNOSIS — Z87891 Personal history of nicotine dependence: Secondary | ICD-10-CM

## 2019-01-07 DIAGNOSIS — M654 Radial styloid tenosynovitis [de Quervain]: Secondary | ICD-10-CM

## 2019-01-07 DIAGNOSIS — G5603 Carpal tunnel syndrome, bilateral upper limbs: Secondary | ICD-10-CM | POA: Diagnosis not present

## 2019-01-07 DIAGNOSIS — R911 Solitary pulmonary nodule: Secondary | ICD-10-CM

## 2019-01-07 NOTE — Progress Notes (Signed)
  Pulmonary Nodule Clinic Telephone Note  Received referral from [facility], NP (open door clinic)  Patient was evaluated for back pain that was worsening x1 year back in June 2018.  Subsequent imaging revealed scattered subsolid nodules within each lung each 4 to 5 mm in size.  Noncontrast CT was recommended in 3 to 6 months.  Follow-up CT scan in October 2018 revealed stable small groundglass opacities bilaterally, right more numerous than left.  A CT scan was recommended in 2 years to assess stability.  I personally reviewed all patient's previous imaging including a CT scan from June 2018 and October 2018.  She also had 2 chest x-rays completed in January 2020.  I recommend follow-up with noncontrasted CT scan of the chest in the next couple weeks.  Per past medical history, patient is a current everyday smoker.  Has history of smoking for > 32 years.  High risk factors include: History of heavy smoking, exposure to asbestos, radium or uranium, personal family history of lung cancer, older age, sex (females greater than males), race (black and native [place] greater than weight), marginal speculation, upper lobe location, multiplicity (less than 5 nodules increases risk for malignancy) and emphysema and/or pulmonary fibrosis.   This recommendation follows the consensus statement: Guidelines for Management of Incidental Pulmonary Nodules Detected on CT Images: From the Fleischner Society 2017; Radiology 2017; 284:228-243.    I have placed order for CT scan without contrast to be completed approximately 1-2 weeks.    I will touch base with patient and schedule her virtually for results of the CT scan and recommendations per Fleischner's guidelines and our pulmonary nodule clinic.  Faythe Casa, NP 01/07/2019 9:59 AM

## 2019-01-07 NOTE — Progress Notes (Signed)
Office Visit Note   Patient: ELLYN Gomez           Date of Birth: 1965-12-14           MRN: [id number] Visit Date: 01/07/2019              Requested by: Marcial Pacas, MD [address] [place],  [place] [address] PCP: Langston Reusing, NP   Assessment & Plan: Visit Diagnoses:  1. Bilateral carpal tunnel syndrome   2. De Quervain's tenosynovitis, left     Plan: Impression is bilateral carpal tunnel syndrome right moderate to severe and left moderate. I reviewed the nerve conduction studies today and based on discussion patient has agreed to proceed with right carpal tunnel release and a left de Quervain's injection in the operating room.  Risk benefits alternatives and rehab and recovery were reviewed today with the patient.  All questions answered to her satisfaction.  Follow-Up Instructions: Return for 1 week postop visit.   Orders:  No orders of the defined types were placed in this encounter.  No orders of the defined types were placed in this encounter.     Procedures: No procedures performed   Clinical Data: No additional findings.   Subjective: Chief Complaint  Patient presents with   Right Hand - Pain   Left Hand - Pain    HPI patient is a pleasant right-hand-dominant 53 year old female who presents our clinic today to discuss nerve conduction studies bilateral upper extremities.  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  She noticed pain numbness and tingling to both hands several years back.  She was sent for nerve conduction study in November 2020.  Results showed moderate to severe median nerve compression on the right and moderate compression on the left.  She notes symptoms on both sides but left is actually worse than the right.  All of her pain is to the volar wrist.  She gets numbness in all 10 fingers.  Picking up heavy pans as well as doing work in the kitchen seems to  aggravate her symptoms.  She has tried a night splint on the left without significant relief of symptoms.  She has not tried over-the-counter pain medications for her pain.  She does note a recent injury to her left wrist a few months ago at work.  She notes that she felt a popping sensation followed by pain several days to weeks later.  She does have an upcoming MRI of her left wrist on 01/15/2019.  Of note, she is prediabetic.   Review of Systems as detailed in HPI.  All others reviewed and are negative.   Objective: Vital Signs: There were no vitals taken for this visit.  Physical Exam well-developed well-nourished female in no acute distress.  Alert and oriented x3.  Ortho Exam examination of her left upper extremity reveals a positive Tinel at the elbow, positive Tinel at the wrist and positive Phalen.  She does have moderate tenderness over the first dorsal compartment.  Positive Finkelstein.  Right upper extremity reveals a negative Tinel at the elbow, positive Tinel at the wrist and positive Phalen.  No thenar atrophy.    Specialty Comments:  No specialty comments available.  Imaging: No new imaging   PMFS History: Patient Active Problem List   Diagnosis Date Noted   Bilateral carpal tunnel syndrome 12/31/2018   Pain in both hands 12/24/2018  Neck pain on right side 12/24/2018   Prediabetes 11/21/2018   Bilateral hand pain 11/07/2018   Health care maintenance 11/07/2018   Neck pain 11/07/2018   Anxiety 03/12/2018   COPD with acute exacerbation ([place]) 03/12/2018   HLD (hyperlipidemia) 03/12/2018   CAD (coronary artery disease), native coronary artery 02/23/2017   Chest pain 02/22/2017   Chest pain with moderate risk for cardiac etiology 02/22/2017   Tobacco abuse 10/04/2016   Lung nodule, multiple 09/26/2016   Diabetes ([place]) 04/03/2014   COPD (chronic obstructive pulmonary disease) ([place]) 12/17/2012   Seizure ([place]) 12/17/2012   Past Medical History:    Diagnosis Date   Allergy    Anxiety    Asthma    Bilateral carpal tunnel syndrome 12/31/2018   Bipolar affective, manic ([place]) 1996   COPD (chronic obstructive pulmonary disease) ([place])    Depression    HLD (hyperlipidemia) 03/12/2018   Lung nodule, multiple 09/26/2016   Manic depressive illness ([place]) 1996   Seizures ([place])    Substance abuse ([place])    Tobacco abuse 10/04/2016    Family History  Problem Relation Age of Onset   Stroke Mother    Heart attack Father     Past Surgical History:  Procedure Laterality Date   ABDOMINAL HYSTERECTOMY     COLONOSCOPY WITH PROPOFOL N/A 11/28/2018   Procedure: COLONOSCOPY WITH PROPOFOL;  Surgeon: [name], MD;  Location: [facility] ENDOSCOPY;  Service: Gastroenterology;  Laterality: N/A;   TUBAL LIGATION     Social History   Occupational History   Not on file  Tobacco Use   Smoking status: Current Every Day Smoker    Packs/day: 1.00    Years: 36.00    Pack years: 36.00    Types: Cigarettes   Smokeless tobacco: Former Systems developer    Types: Chew, Snuff   Tobacco comment: Using chew to wean herself off cigarettes  Substance and Sexual Activity   Alcohol use: No    Comment: "Occasionally"   Drug use: No    Types: "Crack" cocaine    Comment: history 2013   Sexual activity: Yes    Birth control/protection: Condom, None

## 2019-01-08 ENCOUNTER — Encounter (HOSPITAL_BASED_OUTPATIENT_CLINIC_OR_DEPARTMENT_OTHER): Payer: Self-pay | Admitting: *Deleted

## 2019-01-08 ENCOUNTER — Other Ambulatory Visit: Payer: Self-pay

## 2019-01-13 ENCOUNTER — Other Ambulatory Visit (HOSPITAL_COMMUNITY): Payer: Self-pay | Attending: Orthopaedic Surgery

## 2019-01-13 ENCOUNTER — Other Ambulatory Visit: Payer: Self-pay

## 2019-01-13 ENCOUNTER — Other Ambulatory Visit (HOSPITAL_COMMUNITY)
Admission: RE | Admit: 2019-01-13 | Discharge: 2019-01-13 | Disposition: A | Discharge status: Home / Self Care | Payer: Self-pay | Source: Ambulatory Visit | Attending: Orthopaedic Surgery | Admitting: Orthopaedic Surgery

## 2019-01-13 DIAGNOSIS — Z01812 Encounter for preprocedural laboratory examination: Secondary | ICD-10-CM | POA: Insufficient documentation

## 2019-01-13 DIAGNOSIS — Z20828 Contact with and (suspected) exposure to other viral communicable diseases: Secondary | ICD-10-CM | POA: Insufficient documentation

## 2019-01-13 LAB — SARS CORONAVIRUS 2 (TAT 6-24 HRS): SARS Coronavirus 2: NEGATIVE

## 2019-01-14 ENCOUNTER — Telehealth: Payer: Self-pay | Admitting: *Deleted

## 2019-01-14 DIAGNOSIS — M654 Radial styloid tenosynovitis [de Quervain]: Secondary | ICD-10-CM

## 2019-01-14 DIAGNOSIS — G5601 Carpal tunnel syndrome, right upper limb: Secondary | ICD-10-CM

## 2019-01-14 NOTE — Telephone Encounter (Signed)
Pt made aware of referral to [facility] from PCP. Pt is in agreement to have upcoming CT scan and follow up as recommended.  Pt has been made aware of upcoming appts for follow up CT scan and follow up appt with Jennifer Burns, NP in the [facility]. Pt verbalized understanding. Nothing further needed at this time.  

## 2019-01-15 ENCOUNTER — Telehealth: Payer: Self-pay

## 2019-01-15 ENCOUNTER — Ambulatory Visit (HOSPITAL_BASED_OUTPATIENT_CLINIC_OR_DEPARTMENT_OTHER)
Admission: RE | Admit: 2019-01-15 | Discharge: 2019-01-15 | Disposition: A | Discharge status: Home / Self Care | Payer: Self-pay | Attending: Orthopaedic Surgery | Admitting: Orthopaedic Surgery

## 2019-01-15 ENCOUNTER — Encounter (HOSPITAL_BASED_OUTPATIENT_CLINIC_OR_DEPARTMENT_OTHER): Payer: Self-pay

## 2019-01-15 ENCOUNTER — Ambulatory Visit (HOSPITAL_BASED_OUTPATIENT_CLINIC_OR_DEPARTMENT_OTHER): Payer: Self-pay | Admitting: Certified Registered Nurse Anesthetist

## 2019-01-15 ENCOUNTER — Encounter (HOSPITAL_BASED_OUTPATIENT_CLINIC_OR_DEPARTMENT_OTHER)
Admission: RE | Disposition: A | Discharge status: Home / Self Care | Payer: Self-pay | Source: Home / Self Care | Attending: Orthopaedic Surgery

## 2019-01-15 ENCOUNTER — Ambulatory Visit
Admission: RE | Admit: 2019-01-15 | Discharge: 2019-01-15 | Disposition: A | Discharge status: Home / Self Care | Payer: Self-pay | Source: Ambulatory Visit | Attending: Neurology | Admitting: Neurology

## 2019-01-15 ENCOUNTER — Other Ambulatory Visit: Payer: Self-pay

## 2019-01-15 DIAGNOSIS — Z7982 Long term (current) use of aspirin: Secondary | ICD-10-CM | POA: Insufficient documentation

## 2019-01-15 DIAGNOSIS — G5601 Carpal tunnel syndrome, right upper limb: Secondary | ICD-10-CM

## 2019-01-15 DIAGNOSIS — Z7984 Long term (current) use of oral hypoglycemic drugs: Secondary | ICD-10-CM | POA: Insufficient documentation

## 2019-01-15 DIAGNOSIS — M79641 Pain in right hand: Secondary | ICD-10-CM | POA: Insufficient documentation

## 2019-01-15 DIAGNOSIS — M79642 Pain in left hand: Secondary | ICD-10-CM | POA: Insufficient documentation

## 2019-01-15 DIAGNOSIS — M542 Cervicalgia: Secondary | ICD-10-CM | POA: Insufficient documentation

## 2019-01-15 DIAGNOSIS — R569 Unspecified convulsions: Secondary | ICD-10-CM | POA: Insufficient documentation

## 2019-01-15 DIAGNOSIS — F419 Anxiety disorder, unspecified: Secondary | ICD-10-CM | POA: Insufficient documentation

## 2019-01-15 DIAGNOSIS — Z7951 Long term (current) use of inhaled steroids: Secondary | ICD-10-CM | POA: Insufficient documentation

## 2019-01-15 DIAGNOSIS — Z79899 Other long term (current) drug therapy: Secondary | ICD-10-CM | POA: Insufficient documentation

## 2019-01-15 DIAGNOSIS — F319 Bipolar disorder, unspecified: Secondary | ICD-10-CM | POA: Insufficient documentation

## 2019-01-15 DIAGNOSIS — M654 Radial styloid tenosynovitis [de Quervain]: Secondary | ICD-10-CM

## 2019-01-15 DIAGNOSIS — J449 Chronic obstructive pulmonary disease, unspecified: Secondary | ICD-10-CM | POA: Insufficient documentation

## 2019-01-15 HISTORY — PX: CARPAL TUNNEL RELEASE: SHX101

## 2019-01-15 LAB — GLUCOSE, CAPILLARY
Glucose-Capillary: 101 mg/dL — ABNORMAL HIGH (ref 70–99)
Glucose-Capillary: 120 mg/dL — ABNORMAL HIGH (ref 70–99)

## 2019-01-15 SURGERY — CARPAL TUNNEL RELEASE
Anesthesia: Monitor Anesthesia Care | Site: Wrist | Laterality: Right

## 2019-01-15 MED ORDER — FENTANYL CITRATE (PF) 100 MCG/2ML IJ SOLN: 50.0000 ug | INTRAMUSCULAR | Status: DC | PRN

## 2019-01-15 MED ORDER — LIDOCAINE-EPINEPHRINE 1 %-1:100000 IJ SOLN
INTRAMUSCULAR | Status: AC
  Filled 2019-01-15: qty 1

## 2019-01-15 MED ORDER — FENTANYL CITRATE (PF) 100 MCG/2ML IJ SOLN
INTRAMUSCULAR | Status: AC
  Filled 2019-01-15: qty 2

## 2019-01-15 MED ORDER — ONDANSETRON HCL 4 MG/2ML IJ SOLN: 4.0000 mg | Freq: Four times a day (QID) | INTRAMUSCULAR | Status: DC | PRN

## 2019-01-15 MED ORDER — CHLORHEXIDINE GLUCONATE 4 % EX LIQD: 60.0000 mL | Freq: Once | CUTANEOUS | Status: DC

## 2019-01-15 MED ORDER — BUPIVACAINE HCL (PF) 0.25 % IJ SOLN
INTRAMUSCULAR | Status: DC | PRN
  Administered 2019-01-15: 1 mL

## 2019-01-15 MED ORDER — FENTANYL CITRATE (PF) 100 MCG/2ML IJ SOLN: 25.0000 ug | INTRAMUSCULAR | Status: DC | PRN

## 2019-01-15 MED ORDER — MIDAZOLAM HCL 2 MG/2ML IJ SOLN
INTRAMUSCULAR | Status: AC
  Filled 2019-01-15: qty 2

## 2019-01-15 MED ORDER — CEFAZOLIN SODIUM-DEXTROSE 2-4 GM/100ML-% IV SOLN
INTRAVENOUS | Status: AC
  Filled 2019-01-15: qty 100

## 2019-01-15 MED ORDER — LACTATED RINGERS IV SOLN: INTRAVENOUS | Status: DC

## 2019-01-15 MED ORDER — LACTATED RINGERS IV SOLN
INTRAVENOUS | Status: DC
  Administered 2019-01-15: 09:00:00 via INTRAVENOUS

## 2019-01-15 MED ORDER — ONDANSETRON HCL 4 MG/2ML IJ SOLN
INTRAMUSCULAR | Status: AC
  Filled 2019-01-15: qty 2

## 2019-01-15 MED ORDER — OXYCODONE HCL 5 MG PO TABS: 5.0000 mg | ORAL_TABLET | Freq: Once | ORAL | Status: DC | PRN

## 2019-01-15 MED ORDER — HYDROCODONE-ACETAMINOPHEN 5-325 MG PO TABS: 1.0000 | ORAL_TABLET | Freq: Three times a day (TID) | ORAL | 0 refills | Status: DC | PRN

## 2019-01-15 MED ORDER — OXYCODONE HCL 5 MG/5ML PO SOLN: 5.0000 mg | Freq: Once | ORAL | Status: DC | PRN

## 2019-01-15 MED ORDER — METHYLPREDNISOLONE ACETATE 40 MG/ML IJ SUSP
INTRAMUSCULAR | Status: AC
  Filled 2019-01-15: qty 1

## 2019-01-15 MED ORDER — CEFAZOLIN SODIUM-DEXTROSE 2-4 GM/100ML-% IV SOLN
2.0000 g | INTRAVENOUS | Status: AC
  Administered 2019-01-15: 2 g via INTRAVENOUS

## 2019-01-15 MED ORDER — PROPOFOL 500 MG/50ML IV EMUL
INTRAVENOUS | Status: AC
  Filled 2019-01-15: qty 50

## 2019-01-15 MED ORDER — LIDOCAINE HCL (PF) 0.5 % IJ SOLN
INTRAMUSCULAR | Status: DC | PRN
  Administered 2019-01-15: 35 mL via INTRAVENOUS

## 2019-01-15 MED ORDER — BUPIVACAINE HCL 0.25 % IJ SOLN
INTRAMUSCULAR | Status: DC | PRN
  Administered 2019-01-15: 10 mL

## 2019-01-15 MED ORDER — PROPOFOL 500 MG/50ML IV EMUL
INTRAVENOUS | Status: DC | PRN
  Administered 2019-01-15: 75 ug/kg/min via INTRAVENOUS

## 2019-01-15 MED ORDER — PROPOFOL 10 MG/ML IV BOLUS
INTRAVENOUS | Status: DC | PRN
  Administered 2019-01-15: 20 mg via INTRAVENOUS
  Administered 2019-01-15: 40 mg via INTRAVENOUS
  Administered 2019-01-15: 20 mg via INTRAVENOUS

## 2019-01-15 MED ORDER — ONDANSETRON HCL 4 MG/2ML IJ SOLN
INTRAMUSCULAR | Status: DC | PRN
  Administered 2019-01-15: 4 mg via INTRAVENOUS

## 2019-01-15 MED ORDER — MIDAZOLAM HCL 2 MG/2ML IJ SOLN: 1.0000 mg | INTRAMUSCULAR | Status: DC | PRN

## 2019-01-15 MED ORDER — FENTANYL CITRATE (PF) 100 MCG/2ML IJ SOLN
INTRAMUSCULAR | Status: DC | PRN
  Administered 2019-01-15 (×2): 50 ug via INTRAVENOUS

## 2019-01-15 MED ORDER — MIDAZOLAM HCL 2 MG/2ML IJ SOLN
INTRAMUSCULAR | Status: DC | PRN
  Administered 2019-01-15: 2 mg via INTRAVENOUS

## 2019-01-15 MED ORDER — LIDOCAINE 2% (20 MG/ML) 5 ML SYRINGE
INTRAMUSCULAR | Status: AC
  Filled 2019-01-15: qty 5

## 2019-01-15 SURGICAL SUPPLY — 49 items
BAND RUBBER #18 3X1/16 STRL (MISCELLANEOUS) ×6 IMPLANT
BLADE MINI RND TIP GREEN BEAV (BLADE) ×3 IMPLANT
BLADE SURG 15 STRL LF DISP TIS (BLADE) ×1 IMPLANT
BLADE SURG 15 STRL SS (BLADE) ×2
BNDG ELASTIC 3X5.8 VLCR STR LF (GAUZE/BANDAGES/DRESSINGS) ×3 IMPLANT
BNDG ESMARK 4X9 LF (GAUZE/BANDAGES/DRESSINGS) ×3 IMPLANT
BNDG PLASTER X FAST 3X3 WHT LF (CAST SUPPLIES) IMPLANT
BRUSH SCRUB EZ PLAIN DRY (MISCELLANEOUS) ×3 IMPLANT
CANISTER SUCT 1200ML W/VALVE (MISCELLANEOUS) ×3 IMPLANT
CORD BIPOLAR FORCEPS 12FT (ELECTRODE) ×3 IMPLANT
COVER BACK TABLE REUSABLE LG (DRAPES) ×3 IMPLANT
COVER MAYO STAND REUSABLE (DRAPES) ×3 IMPLANT
COVER WAND RF STERILE (DRAPES) IMPLANT
CUFF TOURN SGL QUICK 18X4 (TOURNIQUET CUFF) IMPLANT
DECANTER SPIKE VIAL GLASS SM (MISCELLANEOUS) IMPLANT
DRAPE EXTREMITY T 121X128X90 (DISPOSABLE) ×3 IMPLANT
DRAPE IMP U-DRAPE 54X76 (DRAPES) ×3 IMPLANT
DRAPE SURG 17X23 STRL (DRAPES) ×3 IMPLANT
GAUZE 4X4 16PLY RFD (DISPOSABLE) IMPLANT
GAUZE SPONGE 4X4 12PLY STRL (GAUZE/BANDAGES/DRESSINGS) ×3 IMPLANT
GAUZE XEROFORM 1X8 LF (GAUZE/BANDAGES/DRESSINGS) ×3 IMPLANT
GLOVE BIO SURGEON STRL SZ 6.5 (GLOVE) ×1 IMPLANT
GLOVE BIO SURGEONS STRL SZ 6.5 (GLOVE) ×1
GLOVE BIOGEL PI IND STRL 7.0 (GLOVE) ×1 IMPLANT
GLOVE BIOGEL PI INDICATOR 7.0 (GLOVE) ×2
GLOVE ECLIPSE 7.0 STRL STRAW (GLOVE) ×3 IMPLANT
GLOVE SKINSENSE NS SZ7.5 (GLOVE) ×2
GLOVE SKINSENSE STRL SZ7.5 (GLOVE) ×1 IMPLANT
GLOVE SURG SYN 7.5  E (GLOVE) ×2
GLOVE SURG SYN 7.5 E (GLOVE) ×1 IMPLANT
GLOVE SURG SYN 7.5 PF PI (GLOVE) ×1 IMPLANT
GOWN STRL REIN XL XLG (GOWN DISPOSABLE) ×3 IMPLANT
GOWN STRL REUS W/ TWL XL LVL3 (GOWN DISPOSABLE) ×2 IMPLANT
GOWN STRL REUS W/TWL XL LVL3 (GOWN DISPOSABLE) ×4
NDL HYPO 25X1 1.5 SAFETY (NEEDLE) IMPLANT
NEEDLE HYPO 25X1 1.5 SAFETY (NEEDLE) IMPLANT
NS IRRIG 1000ML POUR BTL (IV SOLUTION) ×3 IMPLANT
PACK BASIN DAY SURGERY FS (CUSTOM PROCEDURE TRAY) ×3 IMPLANT
PAD CAST 3X4 CTTN HI CHSV (CAST SUPPLIES) ×1 IMPLANT
PADDING CAST COTTON 3X4 STRL (CAST SUPPLIES) ×2
STOCKINETTE 4X48 STRL (DRAPES) ×3 IMPLANT
SUT ETHILON 3 0 PS 1 (SUTURE) ×3 IMPLANT
SYR BULB 3OZ (MISCELLANEOUS) ×3 IMPLANT
SYR CONTROL 10ML LL (SYRINGE) IMPLANT
TOWEL GREEN STERILE FF (TOWEL DISPOSABLE) ×3 IMPLANT
TRAY DSU PREP LF (CUSTOM PROCEDURE TRAY) ×3 IMPLANT
TUBE CONNECTING 20'X1/4 (TUBING)
TUBE CONNECTING 20X1/4 (TUBING) IMPLANT
UNDERPAD 30X36 HEAVY ABSORB (UNDERPADS AND DIAPERS) ×3 IMPLANT

## 2019-01-15 NOTE — Discharge Instructions (Signed)
Postoperative instructions:  Weightbearing instructions: as tolerated.  No lifting more than 10 lbs.  Dressing instructions: Keep your dressing and/or splint clean and dry at all times.  It will be removed at your first post-operative appointment.  Your stitches and/or staples will be removed at this visit.  Incision instructions:  Do not soak your incision for 3 weeks after surgery.  If the incision gets wet, pat dry and do not scrub the incision.  Pain control:  You have been given a prescription to be taken as directed for post-operative pain control.  In addition, elevate the operative extremity above the heart at all times to prevent swelling and throbbing pain.  Take over-the-counter Colace, 100mg  by mouth twice a day while taking narcotic pain medications to help prevent constipation.  Follow up appointments: 1) 7 days for wound check.   -------------------------------------------------------------------------------------------------------------  After Surgery Pain Control:  After your surgery, post-surgical discomfort or pain is likely. This discomfort can last several days to a few weeks. At certain times of the day your discomfort may be more intense.  Did you receive a nerve block?  A nerve block can provide pain relief for one hour to two days after your surgery. As long as the nerve block is working, you will experience little or no sensation in the area the surgeon operated on.  As the nerve block wears off, you will begin to experience pain or discomfort. It is very important that you begin taking your prescribed pain medication before the nerve block fully wears off. Treating your pain at the first sign of the block wearing off will ensure your pain is better controlled and more tolerable when full-sensation returns. Do not wait until the pain is intolerable, as the medicine will be less effective. It is better to treat pain in advance than to try and catch up.  General  Anesthesia:  If you did not receive a nerve block during your surgery, you will need to start taking your pain medication shortly after your surgery and should continue to do so as prescribed by your surgeon.  Pain Medication:  Most commonly we prescribe Vicodin and Percocet for post-operative pain. Both of these medications contain a combination of acetaminophen (Tylenol) and a narcotic to help control pain.   It takes between 30 and 45 minutes before pain medication starts to work. It is important to take your medication before your pain level gets too intense.   Nausea is a common side effect of many pain medications. You will want to eat something before taking your pain medicine to help prevent nausea.   If you are taking a prescription pain medication that contains acetaminophen, we recommend that you do not take additional over the counter acetaminophen (Tylenol).  Other pain relieving options:   Using a cold pack to ice the affected area a few times a day (15 to 20 minutes at a time) can help to relieve pain, reduce swelling and bruising.   Elevation of the affected area can also help to reduce pain and swelling.   Post Anesthesia Home Care Instructions  Activity: Get plenty of rest for the remainder of the day. A responsible individual must stay with you for 24 hours following the procedure.  For the next 24 hours, DO NOT: -Drive a car -Paediatric nurse -Drink alcoholic beverages -Take any medication unless instructed by your physician -Make any legal decisions or sign important papers.  Meals: Start with liquid foods such as gelatin or soup. Progress  to regular foods as tolerated. Avoid greasy, spicy, heavy foods. If nausea and/or vomiting occur, drink only clear liquids until the nausea and/or vomiting subsides. Call your physician if vomiting continues.  Special Instructions/Symptoms: Your throat may feel dry or sore from the anesthesia or the breathing tube placed in  your throat during surgery. If this causes discomfort, gargle with warm salt water. The discomfort should disappear within 24 hours.  If you had a scopolamine patch placed behind your ear for the management of post- operative nausea and/or vomiting:  1. The medication in the patch is effective for 72 hours, after which it should be removed.  Wrap patch in a tissue and discard in the trash. Wash hands thoroughly with soap and water. 2. You may remove the patch earlier than 72 hours if you experience unpleasant side effects which may include dry mouth, dizziness or visual disturbances. 3. Avoid touching the patch. Wash your hands with soap and water after contact with the patch.

## 2019-01-15 NOTE — Anesthesia Procedure Notes (Signed)
Anesthesia Regional Block: Bier block (IV Regional)   Pre-Anesthetic Checklist: ,, timeout performed, Correct Patient, Correct Site, Correct Laterality, Correct Procedure, Correct Position, site marked, Risks and benefits discussed,  Surgical consent,  Pre-op evaluation,  At surgeon's request and post-op pain management  Laterality: Left  Prep: chloraprep       Needles:  Injection technique: Single-shot      Needle Gauge: 22     Additional Needles:   Procedures:,,,,, intact distal pulses, Esmarch exsanguination, single tourniquet utilized, #20gu IV placed  Narrative:  Start time: 01/15/2019 9:26 AM End time: 01/15/2019 9:27 AM  Performed by: Personally  CRNA: Genelle Bal, CRNA

## 2019-01-15 NOTE — Op Note (Signed)
   DATE OF SURGERY:01/15/2019  PREOPERATIVE DIAGNOSIS:   1. Right carpal tunnel syndrome 2. Left deQuervain's tenosynovitis  POSTOPERATIVE DIAGNOSIS: same  PROCEDURE: 1. Right carpal tunnel release. CPT 64721 2. Left de quervain's injection.  CPT 20550  SURGEON: N. Eduard Roux, M.D.  ASSIST: Ciro Backer [place], [place]; necessary for the timely completion of procedure and due to complexity of procedure.  ANESTHESIA:  Regional  TOURNIQUET TIME: less than 20 minutes  BLOOD LOSS: Minimal.  COMPLICATIONS: None.  PATHOLOGY: None.  INDICATIONS: The patient is a 53 y.o. -year-old female who presented with carpal tunnel syndrome failing nonsurgical management, indicated for surgical release.  DESCRIPTION OF PROCEDURE: The patient was identified in the preoperative holding area.  The operative site was marked by the surgeon and confirmed by the patient.  He was brought back to the operating room.  Anesthesia was induced by the anesthesia team.  A well padded nonsterile tourniquet was placed. The operative extremity was prepped and draped in standard sterile fashion.  A timeout was performed.  Preoperative antibiotics were given.   A palmar incision was made about 5 mm ulnar to the thenar crease.  The palmar aponeurosis was exposed and divided in line with the skin incision. The palmaris brevis was visualized and divided.  The distal edge of the transcarpal ligament was identified. A hemostat was inserted into the carpal tunnel to protect the median nerve and the flexor tendons. Then, the transverse carpal ligament was released under direct visualization. Proximally, a subcutaneous tunnel was made allowing a Sewell retractor to be placed. Then, the distal portion of the antebrachial fascia was released. Distally, all fibrous bands were released. The median nerve was visualized, and the fat pad was exposed. Following release, local infiltration with 0.25% of Sensorcaine was given. The tourniquet was  deflated. Hemostasis achieved.  Wound was irrigated and closed with 4-0 nylon sutures. Sterile dressing applied.  We then performed an injection of 1 cc 40 mg depo, 1 cc 1% lidocaine, 1 cc 0.25% bupivicaine under sterile conditions into the first dorsal wrist compartment.  Band aid was applied.  The patient was transferred to the recovery room in stable condition after all counts were correct.  POSTOPERATIVE PLAN: To start nerve gliding exercises as tolerated and no heavy lifting for four weeks.  Eduard Roux, M.D. [facility] 9:48 AM

## 2019-01-15 NOTE — Transfer of Care (Signed)
Immediate Anesthesia Transfer of Care Note  Patient: Erica Gomez  Procedure(s) Performed: RIGHTCARPAL TUNNEL RELEASE, LEFT DEQUERVAINS INJECTION (Right Wrist)  Patient Location: PACU  Anesthesia Type:MAC and Bier block  Level of Consciousness: drowsy and patient cooperative  Airway & Oxygen Therapy: Patient Spontanous Breathing and Patient connected to face mask oxygen  Post-op Assessment: Report given to RN and Post -op Vital signs reviewed and stable  Post vital signs: Reviewed and stable  Last Vitals:  Vitals Value Taken Time  BP 135/74 01/15/19 0952  Temp    Pulse 92 01/15/19 0955  Resp 15 01/15/19 0955  SpO2 100 % 01/15/19 0955  Vitals shown include unvalidated device data.  Last Pain:  Vitals:   01/15/19 0832  TempSrc: Oral  PainSc: 0-No pain         Complications: No apparent anesthesia complications

## 2019-01-15 NOTE — Anesthesia Preprocedure Evaluation (Addendum)
Anesthesia Evaluation  Patient identified by MRN, date of birth, ID band Patient awake    Reviewed: Allergy & Precautions, H&P , NPO status , Patient's Chart, lab work & pertinent test results  Airway Mallampati: II   Neck ROM: full    Dental   Pulmonary asthma , COPD, Current Smoker,    breath sounds clear to auscultation       Cardiovascular + CAD   Rhythm:regular Rate:Normal  TTE (2015): EF 65%. Normal LV function   Neuro/Psych Seizures -,  PSYCHIATRIC DISORDERS Anxiety Depression Bipolar Disorder  Neuromuscular disease    GI/Hepatic (+)     substance abuse  ,   Endo/Other    Renal/GU      Musculoskeletal   Abdominal   Peds  Hematology   Anesthesia Other Findings   Reproductive/Obstetrics                            Anesthesia Physical Anesthesia Plan  ASA: III  Anesthesia Plan: MAC and Bier Block and Bier Block-LIDOCAINE ONLY   Post-op Pain Management:    Induction: Intravenous  PONV Risk Score and Plan: 1 and Propofol infusion, Treatment may vary due to age or medical condition and Midazolam  Airway Management Planned: Simple Face Mask  Additional Equipment:   Intra-op Plan:   Post-operative Plan:   Informed Consent: I have reviewed the patients History and Physical, chart, labs and discussed the procedure including the risks, benefits and alternatives for the proposed anesthesia with the patient or authorized representative who has indicated his/her understanding and acceptance.       Plan Discussed with: CRNA, Anesthesiologist and Surgeon  Anesthesia Plan Comments:         Anesthesia Quick Evaluation

## 2019-01-15 NOTE — Telephone Encounter (Signed)
Out of work note made for patient. They will pick up today.

## 2019-01-15 NOTE — H&P (Signed)
PREOPERATIVE H&P  Chief Complaint: right carpal tunnel syndrome, left deQuervain's tenosynovitis  HPI: Erica Gomez is a 53 y.o. female who presents for surgical treatment of right carpal tunnel syndrome, left deQuervain's tenosynovitis.  She denies any changes in medical history.  Past Medical History:  Diagnosis Date  . Allergy   . Anxiety   . Asthma   . Bilateral carpal tunnel syndrome 12/31/2018  . Bipolar affective, manic ([place]) 1996  . COPD (chronic obstructive pulmonary disease) ([place])   . Depression   . HLD (hyperlipidemia) 03/12/2018  . Lung nodule, multiple 09/26/2016  . Manic depressive illness ([place]) 1996  . Seizures ([place])   . Substance abuse ([place])   . Tobacco abuse 10/04/2016   Past Surgical History:  Procedure Laterality Date  . ABDOMINAL HYSTERECTOMY    . COLONOSCOPY WITH PROPOFOL N/A 11/28/2018   Procedure: COLONOSCOPY WITH PROPOFOL;  Surgeon: [name], MD;  Location: [facility] ENDOSCOPY;  Service: Gastroenterology;  Laterality: N/A;  . TUBAL LIGATION     Social History   Socioeconomic History  . Marital status: Married    Spouse name: Not on file  . Number of children: Not on file  . Years of education: Not on file  . Highest education level: Not on file  Occupational History  . Not on file  Social Needs  . Financial resource strain: Not on file  . Food insecurity    Worry: Not on file    Inability: Not on file  . Transportation needs    Medical: Not on file    Non-medical: Not on file  Tobacco Use  . Smoking status: Current Every Day Smoker    Packs/day: 1.00    Years: 36.00    Pack years: 36.00    Types: Cigarettes  . Smokeless tobacco: Former Systems developer    Types: Chew, Snuff  . Tobacco comment: Using chew to wean herself off cigarettes  Substance and Sexual Activity  . Alcohol use: No    Comment: "Occasionally"  . Drug use: No    Types: "Crack" cocaine    Comment: history 2013  . Sexual activity: Yes    Birth control/protection: Condom,  None  Lifestyle  . Physical activity    Days per week: Not on file    Minutes per session: Not on file  . Stress: Not on file  Relationships  . Social Herbalist on phone: Not on file    Gets together: Not on file    Attends religious service: Not on file    Active member of club or organization: Not on file    Attends meetings of clubs or organizations: Not on file    Relationship status: Not on file  Other Topics Concern  . Not on file  Social History Narrative  . Not on file   Family History  Problem Relation Age of Onset  . Stroke Mother   . Heart attack Father    Allergies  Allergen Reactions  . Flax Seed Oil [Bio-Flax] Hives    Pt. Self reported   Prior to Admission medications   Medication Sig Start Date End Date Taking? Authorizing Provider  albuterol (VENTOLIN HFA) 108 (90 Base) MCG/ACT inhaler INHALE 2 PUFFS EVERY 4 TO 6 HOURS AS NEEDED 11/07/18  Yes Iloabachie, Chioma E, NP  aspirin EC 81 MG tablet Take 1 tablet (81 mg total) by mouth daily. No ibuprofen for at least one hour 09/26/16  Yes Lada, Satira Anis, MD  citalopram (  CELEXA) 20 MG tablet Take 20 mg by mouth daily.   Yes [provider]  Fluticasone-Salmeterol (ADVAIR DISKUS) 500-50 MCG/DOSE AEPB Inhale 1 puff into the lungs 2 (two) times daily. 11/07/18  Yes Iloabachie, Chioma E, NP  gabapentin (NEURONTIN) 100 MG capsule Take 1 capsule (100 mg total) by mouth 3 (three) times daily. 01/01/19  Yes Iloabachie, Chioma E, NP  metFORMIN (GLUCOPHAGE) 1000 MG tablet Take 0.5 tablets (500 mg total) by mouth daily with breakfast. 01/01/19  Yes Iloabachie, Chioma E, NP  risperiDONE (RISPERDAL) 2 MG tablet Take 2 mg by mouth daily.   Yes [provider]  tiotropium (SPIRIVA HANDIHALER) 18 MCG inhalation capsule Place 1 capsule (18 mcg total) into inhaler and inhale daily. 11/07/18 11/07/19 Yes Iloabachie, Chioma E, NP  varenicline (CHANTIX CONTINUING MONTH PAK) 1 MG tablet Take 1 tablet (1 mg total)  by mouth 2 (two) times daily. 02/23/17  Yes Gollan, Kathlene November, MD  HYDROcodone-acetaminophen (NORCO) 5-325 MG tablet Take 1-2 tablets by mouth 3 (three) times daily as needed. 01/15/19   Leandrew Koyanagi, MD  ipratropium-albuterol (DUONEB) 0.5-2.5 (3) MG/3ML SOLN Take 3 mLs by nebulization every 6 (six) hours. 03/14/18   Dustin Flock, MD     Positive ROS: All other systems have been reviewed and were otherwise negative with the exception of those mentioned in the HPI and as above.  Physical Exam: General: Alert, no acute distress Cardiovascular: No pedal edema Respiratory: No cyanosis, no use of accessory musculature GI: abdomen soft Skin: No lesions in the area of chief complaint Neurologic: Sensation intact distally Psychiatric: Patient is competent for consent with normal mood and affect Lymphatic: no lymphedema  MUSCULOSKELETAL: exam stable  Assessment: right carpal tunnel syndrome, left deQuervain's tenosynovitis  Plan: Plan for Procedure(s): RIGHTCARPAL TUNNEL RELEASE, LEFT DEQUERVAINS INJECTION  The risks benefits and alternatives were discussed with the patient including but not limited to the risks of nonoperative treatment, versus surgical intervention including infection, bleeding, nerve injury,  blood clots, cardiopulmonary complications, morbidity, mortality, among others, and they were willing to proceed.   Eduard Roux, MD   01/15/2019 8:28 AM

## 2019-01-16 ENCOUNTER — Telehealth: Payer: Self-pay | Admitting: Neurology

## 2019-01-16 ENCOUNTER — Telehealth: Payer: Self-pay | Admitting: Orthopaedic Surgery

## 2019-01-16 ENCOUNTER — Encounter (HOSPITAL_BASED_OUTPATIENT_CLINIC_OR_DEPARTMENT_OTHER): Payer: Self-pay | Admitting: Orthopaedic Surgery

## 2019-01-16 NOTE — Telephone Encounter (Signed)
I spoke to the patient and provided her with the results below. She verbalized understanding. 

## 2019-01-16 NOTE — Telephone Encounter (Signed)
IMPRESSION: Multilevel degenerative changes as detailed above with suboptimal stenosis evaluation due to artifact. Foraminal stenosis is greatest at C5-C6.   Please call patient, MRI of cervical spine showed multiple level degenerative changes, most severe at C5-6, with evidence of foramial stenosis.  EMG/NCS on Dec 31 2018 showed no evidence of cervical radiculopathy.

## 2019-01-16 NOTE — Telephone Encounter (Signed)
Patient called concerning her disability. Patient said she will contact [employer] and ask if they sent the S/T disability to be completed by Dr. Erlinda Hong. The number to contact patient is [phone]

## 2019-01-17 ENCOUNTER — Ambulatory Visit
Admission: RE | Admit: 2019-01-17 | Discharge: 2019-01-17 | Disposition: A | Discharge status: Home / Self Care | Payer: Self-pay | Source: Ambulatory Visit | Attending: Oncology | Admitting: Oncology

## 2019-01-17 ENCOUNTER — Encounter (HOSPITAL_BASED_OUTPATIENT_CLINIC_OR_DEPARTMENT_OTHER): Payer: Self-pay | Admitting: Orthopaedic Surgery

## 2019-01-17 ENCOUNTER — Other Ambulatory Visit: Payer: Self-pay

## 2019-01-17 DIAGNOSIS — R911 Solitary pulmonary nodule: Secondary | ICD-10-CM | POA: Insufficient documentation

## 2019-01-17 NOTE — Anesthesia Postprocedure Evaluation (Signed)
Anesthesia Post Note  Patient: Erica Gomez  Procedure(s) Performed: RIGHTCARPAL TUNNEL RELEASE, LEFT DEQUERVAINS INJECTION (Right Wrist)     Patient location during evaluation: PACU Anesthesia Type: MAC and Bier Block Level of consciousness: awake and alert Pain management: pain level controlled Vital Signs Assessment: post-procedure vital signs reviewed and stable Respiratory status: spontaneous breathing, nonlabored ventilation, respiratory function stable and patient connected to nasal cannula oxygen Cardiovascular status: stable and blood pressure returned to baseline Postop Assessment: no apparent nausea or vomiting Anesthetic complications: no    Last Vitals:  Vitals:   01/15/19 1030 01/15/19 1120  BP: 135/69 (!) 158/98  Pulse: 85 85  Resp: 13 18  Temp:  36.6 C  SpO2: 99% 96%    Last Pain:  Vitals:   01/15/19 1120  TempSrc:   PainSc: 0-No pain                 Kevion Fatheree S

## 2019-01-20 ENCOUNTER — Inpatient Hospital Stay: Payer: Self-pay | Attending: Oncology | Admitting: Oncology

## 2019-01-20 DIAGNOSIS — R911 Solitary pulmonary nodule: Secondary | ICD-10-CM

## 2019-01-20 NOTE — Progress Notes (Signed)
Pulmonary Nodule Clinic Consult note [facility]  [phone] [phone]  Patient Care Team: Langston Reusing, NP as PCP - General (Gerontology)   Name of the patient: Erica Gomez  [id number]  1965/06/19   Date of visit: 01/20/2019   Diagnosis- Lung Nodule  Virtual Visit via Telephone Note   I connected with Erica Gomez on 01/27/19 at @ 11:15am by telephone visit and verified that I am speaking with the correct person using two identifiers.   I discussed the limitations, risks, security and privacy concerns of performing an evaluation and management service by telemedicine and the availability of in-person appointments. I also discussed with the patient that there may be a patient responsible charge related to this service. The patient expressed understanding and agreed to proceed.   Other persons participating in the visit and their role in the encounter:   Patient's location: Home  Provider's location: Office  Chief complaint/ Reason for visit- Pulmonary Nodule Clinic Initial Visit  Past Medical History:  Patient is managed/referred by [facility]a Asp[facility]  Patient was evaluated for back pain that was worsening x1 year back in June 2018.  Subsequent imaging revealed scattered subsolid nodules within each lung each 4 to 5 mm in size.  Noncontrasted CT was recommended in 3 to 6 months.  Follow-up CT scan in October 2018 revealed stable small groundglass opacities bilaterally, right more numerous than left.  A CT scan was recommended in 2 years to assess stability.  Interval history-patient presents today for results of her recent CT scan.  Erica Gomez is a 53 year old current everyday smoker.  She has smoked for greater than 30 years with approximately half to 1 pack/day.  She currently works full-time as a Production manager.  She denies any known exposures to chemicals or dust known to cause  cancer.  Admits to exposure to secondhand smoke.  She has no personal history of cancer.  Her past medical history is positive for: Past Medical History:  Diagnosis Date   Allergy    Anxiety    Asthma    Bilateral carpal tunnel syndrome 12/31/2018   Bipolar affective, manic ([place]) 1996   COPD (chronic obstructive pulmonary disease) (HCC)    Depression    HLD (hyperlipidemia) 03/12/2018   Lung nodule, multiple 09/26/2016   Manic depressive illness ([place]) 1996   Seizures ([place])    Substance abuse ([place])    Tobacco abuse 10/04/2016   She has a past surgical history positive for:  Past Surgical History:  Procedure Laterality Date   ABDOMINAL HYSTERECTOMY     CARPAL TUNNEL RELEASE Right 01/15/2019   Procedure: RIGHTCARPAL TUNNEL RELEASE, LEFT DEQUERVAINS INJECTION;  Surgeon: Leandrew Koyanagi, MD;  Location: [place];  Service: Orthopedics;  Laterality: Right;   COLONOSCOPY WITH PROPOFOL N/A 11/28/2018   Procedure: COLONOSCOPY WITH PROPOFOL;  Surgeon: [name], MD;  Location: [facility] ENDOSCOPY;  Service: Gastroenterology;  Laterality: N/A;   TUBAL LIGATION     She currently feels well.  She has been diagnosed with COPD and has 3 different inhalers she use .  She has occasional shortness of breath with exertion.  Has an active cough with clear/yellow sputum production.  She denies any fevers. She denies any neurologic complaints. Denies recent  illnesses. Denies any easy bleeding or bruising. Reports good appetite and denies weight loss. Denies chest pain. Denies any nausea, vomiting, constipation, or diarrhea. Denies urinary complaints. Patient offers no further specific  complaints today.  ECOG FS:1 - Symptomatic but completely ambulatory  Review of systems- Review of Systems  Constitutional: Positive for malaise/fatigue. Negative for chills, fever and weight loss.  HENT: Positive for congestion. Negative for ear pain and tinnitus.   Eyes: Negative.  Negative for  blurred vision and double vision.  Respiratory: Positive for cough, sputum production and shortness of breath.   Cardiovascular: Negative.  Negative for chest pain, palpitations and leg swelling.  Gastrointestinal: Negative.  Negative for abdominal pain, constipation, diarrhea, nausea and vomiting.  Genitourinary: Negative for dysuria, frequency and urgency.  Musculoskeletal: Negative for back pain and falls.  Skin: Negative.  Negative for rash.  Neurological: Negative.  Negative for weakness and headaches.  Endo/Heme/Allergies: Negative.  Does not bruise/bleed easily.  Psychiatric/Behavioral: Negative.  Negative for depression. The patient is not nervous/anxious and does not have insomnia.      Allergies  Allergen Reactions   Flax Seed Oil [Bio-Flax] Hives    Pt. Self reported     Past Medical History:  Diagnosis Date   Allergy    Anxiety    Asthma    Bilateral carpal tunnel syndrome 12/31/2018   Bipolar affective, manic ([place]) 1996   COPD (chronic obstructive pulmonary disease) (HCC)    Depression    HLD (hyperlipidemia) 03/12/2018   Lung nodule, multiple 09/26/2016   Manic depressive illness ([place]) 1996   Seizures ([place])    Substance abuse ([place])    Tobacco abuse 10/04/2016     Past Surgical History:  Procedure Laterality Date   ABDOMINAL HYSTERECTOMY     CARPAL TUNNEL RELEASE Right 01/15/2019   Procedure: RIGHTCARPAL TUNNEL RELEASE, LEFT DEQUERVAINS INJECTION;  Surgeon: Leandrew Koyanagi, MD;  Location: [place];  Service: Orthopedics;  Laterality: Right;   COLONOSCOPY WITH PROPOFOL N/A 11/28/2018   Procedure: COLONOSCOPY WITH PROPOFOL;  Surgeon: [name], MD;  Location: [facility]COPY;  Service: Gastroenterology;  Laterality: N/A;   TUBAL LIGATION      Social History   Socioeconomic History   Marital status: Married    Spouse name: Not on file   Number of children: Not on file   Years of education: Not on file   Highest  education level: Not on file  Occupational History   Not on file  Tobacco Use   Smoking status: Current Every Day Smoker    Packs/day: 1.00    Years: 36.00    Pack years: 36.00    Types: Cigarettes   Smokeless tobacco: Former Systems developer    Types: Chew, Snuff   Tobacco comment: Using chew to wean herself off cigarettes  Substance and Sexual Activity   Alcohol use: No    Comment: "Occasionally"   Drug use: No    Types: "Crack" cocaine    Comment: history 2013   Sexual activity: Yes    Birth control/protection: Condom, None  Other Topics Concern   Not on file  Social History Narrative   Not on file   Social Determinants of Health   Financial Resource Strain:    Difficulty of Paying Living Expenses: Not on file  Food Insecurity:    Worried About Charity fundraiser in the Last Year: Not on file   [employer] of Food in the Last Year: Not on file  Transportation Needs:    Lack of Transportation (Medical): Not on file   Lack of Transportation (Non-Medical): Not on file  Physical Activity:    Days of Exercise per Week: Not on file  Minutes of Exercise per Session: Not on file  Stress:    Feeling of Stress : Not on file  Social Connections:    Frequency of Communication with Friends and Family: Not on file   Frequency of Social Gatherings with Friends and Family: Not on file   Attends Religious Services: Not on file   Active Member of Clubs or Organizations: Not on file   Attends Archivist Meetings: Not on file   Marital Status: Not on file  Intimate Partner Violence:    Fear of Current or Ex-Partner: Not on file   Emotionally Abused: Not on file   Physically Abused: Not on file   Sexually Abused: Not on file    Family History  Problem Relation Age of Onset   Stroke Mother    Heart attack Father      Current Outpatient Medications:    albuterol (VENTOLIN HFA) 108 (90 Base) MCG/ACT inhaler, INHALE 2 PUFFS EVERY 4 TO 6 HOURS AS  NEEDED, Disp: 54 g, Rfl: 0   aspirin EC 81 MG tablet, Take 1 tablet (81 mg total) by mouth daily. No ibuprofen for at least one hour, Disp: , Rfl:    citalopram (CELEXA) 20 MG tablet, Take 20 mg by mouth daily., Disp: , Rfl:    Fluticasone-Salmeterol (ADVAIR DISKUS) 500-50 MCG/DOSE AEPB, Inhale 1 puff into the lungs 2 (two) times daily., Disp: 3 each, Rfl: 1   gabapentin (NEURONTIN) 100 MG capsule, Take 1 capsule (100 mg total) by mouth 3 (three) times daily., Disp: 30 capsule, Rfl: 2   HYDROcodone-acetaminophen (NORCO) 5-325 MG tablet, Take 1-2 tablets by mouth 3 (three) times daily as needed., Disp: 14 tablet, Rfl: 0   ipratropium-albuterol (DUONEB) 0.5-2.5 (3) MG/3ML SOLN, Take 3 mLs by nebulization every 6 (six) hours., Disp: 360 mL, Rfl: 2   metFORMIN (GLUCOPHAGE) 1000 MG tablet, Take 0.5 tablets (500 mg total) by mouth daily with breakfast., Disp: 30 tablet, Rfl: 3   mupirocin ointment (BACTROBAN) 2 %, Apply to affected area 2 times daily, Disp: 22 g, Rfl: 0   risperiDONE (RISPERDAL) 2 MG tablet, Take 2 mg by mouth daily., Disp: , Rfl:    tiotropium (SPIRIVA HANDIHALER) 18 MCG inhalation capsule, Place 1 capsule (18 mcg total) into inhaler and inhale daily., Disp: 30 capsule, Rfl: 11   varenicline (CHANTIX CONTINUING MONTH PAK) 1 MG tablet, Take 1 tablet (1 mg total) by mouth 2 (two) times daily., Disp: 60 tablet, Rfl: 3  Physical exam: There were no vitals filed for this visit. Limited d/t virtual visit.  CMP Latest Ref Rng & Units 12/18/2018  Glucose 65 - 99 mg/dL 125(H)  BUN 6 - 24 mg/dL 11  Creatinine 0.57 - 1.00 mg/dL 1.08(H)  Sodium 134 - 144 mmol/L 140  Potassium 3.5 - 5.2 mmol/L 4.5  Chloride 96 - 106 mmol/L 103  CO2 20 - 29 mmol/L 26  Calcium 8.7 - 10.2 mg/dL 9.7  Total Protein 6.0 - 8.5 g/dL -  Total Bilirubin 0.0 - 1.2 mg/dL -  Alkaline Phos 39 - 117 IU/L -  AST 0 - 40 IU/L -  ALT 0 - 32 IU/L -   CBC Latest Ref Rng & Units 11/13/2018  WBC 3.4 - 10.8 x10E3/uL  8.4  Hemoglobin 11.1 - 15.9 g/dL 12.3  Hematocrit 34.0 - 46.6 % 38.3  Platelets 150 - 450 x10E3/uL 308    No images are attached to the encounter.  CT Chest Wo Contrast  Result Date: 01/17/2019 CLINICAL DATA:  Follow-up pulmonary nodules.  COPD. EXAM: CT CHEST WITHOUT CONTRAST TECHNIQUE: Multidetector CT imaging of the chest was performed following the standard protocol without IV contrast. COMPARISON:  12/12/2016 chest CT. FINDINGS: Cardiovascular: Normal heart size. No significant pericardial effusion/thickening. Three-vessel coronary atherosclerosis. Aberrant nonaneurysmal right subclavian artery arising from the distal aortic arch with retroesophageal course. Normal course and caliber of the thoracic aorta. Normal caliber pulmonary arteries. Mediastinum/Nodes: No discrete thyroid nodules. Unremarkable esophagus. Mild bilateral axillary adenopathy measuring up to 1.2 cm on the right (series 2/image 32) and 1.1 cm on the left (series 2/image 28), not significantly changed. No pathologically enlarged mediastinal nodes. No discrete hilar adenopathy on these noncontrast images. Lungs/Pleura: No pneumothorax. No pleural effusion. Mild centrilobular emphysema with prominent diffuse bronchial wall thickening. No acute consolidative airspace disease or lung masses. Numerous centrilobular tiny ground-glass pulmonary nodules scattered throughout both lungs, upper lung predominant, all unchanged, largest 4 mm in the right upper lobe (series 3/image 45) and 4 mm in the left lower lobe (series 3/image 83). No new significant pulmonary nodules. Mild focal cystic bronchiectasis in the superior segment left lower lobe (series 3/image 71), stable. Mild cylindrical bronchiectasis in the medial upper lobes bilaterally, stable. Upper abdomen: Mild colonic diverticulosis. Musculoskeletal:  No aggressive appearing focal osseous lesions. IMPRESSION: 1. Several scattered upper lung predominant centrilobular tiny ground-glass  pulmonary nodules, largest 4 mm, all stable since 2018 CT, requiring no further follow-up. This recommendation follows the consensus statement: Guidelines for Management of Incidental Pulmonary Nodules Detected on CT Images: From the Fleischner Society 2017; Radiology 2017; 284:228-243. 2. Mild centrilobular emphysema with prominent diffuse bronchial wall thickening, compatible with the reported history of COPD. 3. Chronic mild bilateral bronchiectasis, stable. 4. Chronic mild bilateral axillary lymphadenopathy, stable, presumably benign/reactive. 5. Aberrant right subclavian artery. 6. Three-vessel coronary atherosclerosis. Emphysema (ICD10-J43.9). Electronically Signed   By: Ilona Sorrel M.D.   On: 01/17/2019 16:15   MR CERVICAL SPINE WO CONTRAST  Result Date: 01/16/2019 CLINICAL DATA:  Neck pain radiating into right upper extremity EXAM: MRI CERVICAL SPINE WITHOUT CONTRAST TECHNIQUE: Multiplanar, multisequence MR imaging of the cervical spine was performed. No intravenous contrast was administered. COMPARISON:  None. FINDINGS: Motion artifact is present. Alignment: Straightening of the cervical lordosis. Anteroposterior alignment is maintained. Vertebrae: Vertebral body heights are preserved. There is no significant marrow edema. No suspicious osseous lesion. Cord: Normal caliber and signal. Posterior Fossa, vertebral arteries, paraspinal tissues: Unremarkable Disc levels: C2-C3: Central disc protrusion, endplate osteophytes, and left uncovertebral hypertrophy. Mild canal stenosis. No right foraminal stenosis. Moderate left foraminal stenosis. C3-C4: Disc bulge, endplate osteophytes, and right greater than left uncovertebral hypertrophy. Mild canal stenosis. Moderate foraminal stenosis. C4-C5: Disc bulge, endplate osteophytes, and uncovertebral hypertrophy. Minor canal and foraminal stenosis. C5-C6: Disc bulge, endplate osteophytes, and uncovertebral hypertrophy. Minor canal stenosis. Marked foraminal  stenosis. C6-C7: Disc bulge, endplate osteophytes, and uncovertebral hypertrophy. Moderate canal stenosis. Mild right foraminal stenosis. No left foraminal stenosis. C7-T1: Disc bulge, endplate osteophytes, and facet hypertrophy. No canal or foraminal stenosis. IMPRESSION: Multilevel degenerative changes as detailed above with suboptimal stenosis evaluation due to artifact. Foraminal stenosis is greatest at C5-C6. Electronically Signed   By: Macy Mis M.D.   On: 01/16/2019 09:04   NCV with EMG(electromyography)  Result Date: 12/31/2018 Marcial Pacas, MD     12/31/2018  5:06 PM     Full Name: Erica Gomez Gender: Female MRN #: [id number] Date of Birth: 19-Mar-1965   Visit Date: 12/31/2018 14:12 Age: 53 Years 3 Months Old Examining  Physician: Marcial Pacas, MD Referring Physician: Marcial Pacas, MD History: 53 year old female presented with intermittent bilateral hands paresthesia. Summary of the tests: Nerve conduction study: Bilateral median sensory responses were absent.  Bilateral median motor responses showed significantly prolonged distal latency, right side also has mildly decreased CMAP amplitude. Left ulnar sensory and motor responses were normal.  Right ulnar sensory response showed mildly decreased snap amplitude.  Right ulnar motor responses showed 12 m/s velocity drop across right elbow. Bilateral radial sensory responses were normal. Electromyography: Selected needle examination of bilateral upper extremity muscles were performed.  Bilateral abductor pollicis brevis showed mildly enlarged motor unit potential with mildly decreased recruitment patterns. Conclusion: This is an abnormal study.  There is electrodiagnostic evidence of bilateral median neuropathy across the wrist, consistent with bilateral carpal tunnel syndromes.  Right side is moderate to severe, left side is moderate, demyelinating in nature, there is no evidence of axonal loss. ------------------------------- Marcial Pacas, M.D. PhD [facility] [place], [place] [address] Tel: [phone] Fax: [phone]     [facility]   Nerve / Sites Muscle Latency Ref. Amplitude Ref. Rel Amp Segments Distance Velocity Ref. Area   ms ms mV mV %  cm m/s m/s mVms R Median - APB    Wrist APB 6.1 ?4.4 3.6 ?4.0 100 Wrist - APB 7   13.7    Upper arm APB 11.4  3.3  92.1 Upper arm - Wrist 21 40 ?49 13.3 L Median - APB    Wrist APB 6.8 ?4.4 5.1 ?4.0 100 Wrist - APB 7   18.7    Upper arm APB 11.5  4.4  85.6 Upper arm - Wrist 20 42 ?49 16.8 R Ulnar - ADM    Wrist ADM 2.7 ?3.3 10.3 ?6.0 100 Wrist - ADM 7   28.9    B.Elbow ADM 6.5  8.6  83.1 B.Elbow - Wrist 19 50 ?49 27.1    A.Elbow ADM 9.2  7.8  90.3 A.Elbow - B.Elbow 10 38 ?49 25.4        A.Elbow - Wrist     L Ulnar - ADM    Wrist ADM 2.6 ?3.3 8.7 ?6.0 100 Wrist - ADM 7   28.4    B.Elbow ADM 6.0  7.6  87.5 B.Elbow - Wrist 18 52 ?49 24.9    A.Elbow ADM 8.0  7.3  96.6 A.Elbow - B.Elbow 10 52 ?49 26.2        A.Elbow - Wrist               SNC   Nerve / Sites Rec. Site Peak Lat Ref.  Amp Ref. Segments Distance   ms ms V V  cm R Radial - Anatomical snuff box (Forearm)    Forearm Wrist 2.4 ?2.9 18 ?15 Forearm - Wrist 10 L Radial - Anatomical snuff box (Forearm)    Forearm Wrist 2.6 ?2.9 16 ?15 Forearm - Wrist 10 R Median - Orthodromic (Dig II, Mid palm)    Dig II Wrist NR ?3.4 NR ?10 Dig II - Wrist 13 L Median - Orthodromic (Dig II, Mid palm)    Dig II Wrist NR ?3.4 NR ?10 Dig II - Wrist 13 R Ulnar - Orthodromic, (Dig V, Mid palm)    Dig V Wrist 2.8 ?3.1 4 ?5 Dig V - Wrist 11 L Ulnar - Orthodromic, (Dig V, Mid palm)    Dig V Wrist 2.7 ?3.1 7 ?5 Dig V - Wrist 11  F  Wave   Nerve F Lat Ref.  ms ms R Ulnar - ADM 30.1 ?32.0 L Ulnar - ADM 27.0 ?32.0       EMG      EMG Summary Table   Spontaneous MUAP Recruitment Muscle IA Fib PSW Fasc Other Amp Dur. Poly Pattern L. Abductor pollicis brevis Normal None None None _______ Normal Normal Normal Reduced L. Pronator teres Normal None None None _______ Normal  Normal Normal Normal L. Biceps brachii Normal None None None _______ Normal Normal Normal Normal L. Deltoid Normal None None None _______ Normal Normal Normal Normal L. Extensor digitorum communis Normal None None None _______ Normal Normal Normal Normal R. Abductor pollicis brevis Normal None None None _______ Normal Normal Normal Reduced    Assessment and plan- Patient is a 53 y.o. female who presents to pulmonary nodule clinic for follow-up of incidental lung nodules.  A telephone visit was conducted to review most recent CT scan results.    CT chest without contrast from 01/17/2019 revealed several scattered upper lung predominant central [place] tiny groundglass pulmonary nodules with the largest measuring 4 mm.  All nodules were stable since 2018 requiring no additional follow-up.  It also revealed mild bilateral bronchiectasis and bilateral axillary lymphadenopathy stable presumably benign/reactive.   Calculating malignancy probability of a pulmonary nodule: Risk factors include: 1.  Age. 2.  Cancer history. 3.  Diameter of pulmonary nodule and mm 4.  Location 5.  Smoking history 6.  Spiculation present   Based on risk factors, this patient is low risk for the development of lung cancer.  Patient does not need any additional follow-up at this time.    During our visit, we discussed pulmonary nodules are a common incidental finding and are often how lung cancer is discovered.  Lung cancer survival is directly related to the stage at diagnosis.  We discussed that nodules can vary in presentation from solitary pulmonary nodules to masses, 2 groundglass opacities and multiple nodules.  Pulmonary nodules in the majority of cases are benign but the probability of these becoming malignant cannot be undermined.  Early identification of malignant nodules could lead to early diagnosis and increased survival.   We discussed the probability of pulmonary nodules becoming malignant increase with age, pack  years of tobacco use, size/characteristics of the nodule and location; with upper lobe involvement being most worrisome.   We discussed the goal of our clinic is to thoroughly evaluate each nodule, developed a comprehensive, individualized plan of care utilizing the most advanced technology and significantly reduce the time from detection to treatment.  A dedicated pulmonary nodule clinic has proven to indeed expedite the detection and treatment of lung cancer.   Patient education in fact sheet provided along with most recent CT scans.   Visit Diagnosis 1. Lung nodule    Patient expressed understanding and was in agreement with this plan. She also understands that She can call clinic at any time with any questions, concerns, or complaints.   Plan: No follow-up needed.  Disposition: No additiional follow-up needed at this time.  Will cc PCP to this note.  Patient agreebale.   Greater than 50% was spent in counseling and coordination of care with this patient including but not limited to discussion of the relevant topics above (See A&P) including, but not limited to diagnosis and management of acute and chronic medical conditions.   Thank you for allowing me to participate in the care of this very pleasant patient.   Anderson Malta  Ciro Backer, NP [place] [facility] Cell - [phone] Pager- [phone] 01/27/2019 1:30 PM  CC: Dr. Karilyn Cota

## 2019-01-20 NOTE — Progress Notes (Signed)
PATIENT: Erica Gomez DOB: 05-24-1965  REASON FOR VISIT: follow up HISTORY FROM: patient  HISTORY OF PRESENT ILLNESS: Today 01/21/19  HISTORY   Erica Gomez is a 53 year old female, accompanied by her husband, seen in request by pain management NP [place], [place] for evaluation of bilateral hands pain. Initial evaluation was on Dec 24 2018.  I have reviewed and summarized the referring note from the referring physician.  She has PMHx of bipolar disorder, has been current stable dose of celexa 20mg  daily and risperidone 2mg  qhs for many years.  She worked at [employer] over the past 5 years, which requires repetitive hand movement, wrist flexion, arm pronation, flexion.  She had intermittent bilateral hand joints pain, numbness, tingling since 2018, gradually getting worse, sometimes her hand lock up for few minutes, difficulty using her hands, sometimes wake up from sleep with all five fingers paresthesia, she has to shake her hands to make the sensation come back.  On Sept 25th 2020, her bilateral hands has painful spasm, locked up, it was difficulty for her to drive, she has not been able to go back to work since.  She was seen by orthopedics, reported normal x-ray of left wrist, she was treated with left wrist splint with mild improvement.  She also complains of chronic neck pain, radiating pain to both shoulders and arms.   Update January 21, 2019 SS: EMG nerve conduction evaluation in November 2020 was abnormal, evidence of bilateral median neuropathy across the wrist, consistent with bilateral carpal tunnel syndromes, right side is moderate to severe, left side is moderate, demyelinating in nature, no evidence of axonal loss  MRI of cervical spine showed multiple level degenerative changes, most severe at C5-6 with evidence of foraminal stenosis.  EMG nerve conduction showed no evidence of cervical radiculopathy  She had right carpal tunnel release with Dr. Erlinda Hong  01/15/2019, she had injection to the left wrist.  She sees Dr. Erlinda Hong tomorrow.   Occasional neck pain, will radiate from right side of neck, down to right arm. It may be at night, wakes her up from sleep. She hasn't had any of this since surgery, is much better.  Overall, is doing well since surgery, she thinks they may operate on the left side.  She hopes to be able to return to work. She takes gabapentin 100 mg TID.   REVIEW OF SYSTEMS: Out of a complete 14 system review of symptoms, the patient complains only of the following symptoms, and all other reviewed systems are negative.  Paresthesia  ALLERGIES: Allergies  Allergen Reactions  . Flax Seed Oil [Bio-Flax] Hives    Pt. Self reported    HOME MEDICATIONS: Outpatient Medications Prior to Visit  Medication Sig Dispense Refill  . albuterol (VENTOLIN HFA) 108 (90 Base) MCG/ACT inhaler INHALE 2 PUFFS EVERY 4 TO 6 HOURS AS NEEDED 54 g 0  . aspirin EC 81 MG tablet Take 1 tablet (81 mg total) by mouth daily. No ibuprofen for at least one hour    . citalopram (CELEXA) 20 MG tablet Take 20 mg by mouth daily.    . Fluticasone-Salmeterol (ADVAIR DISKUS) 500-50 MCG/DOSE AEPB Inhale 1 puff into the lungs 2 (two) times daily. 3 each 1  . gabapentin (NEURONTIN) 100 MG capsule Take 1 capsule (100 mg total) by mouth 3 (three) times daily. 30 capsule 2  . HYDROcodone-acetaminophen (NORCO) 5-325 MG tablet Take 1-2 tablets by mouth 3 (three) times daily as needed. 14 tablet 0  .  ipratropium-albuterol (DUONEB) 0.5-2.5 (3) MG/3ML SOLN Take 3 mLs by nebulization every 6 (six) hours. 360 mL 2  . metFORMIN (GLUCOPHAGE) 1000 MG tablet Take 0.5 tablets (500 mg total) by mouth daily with breakfast. 30 tablet 3  . risperiDONE (RISPERDAL) 2 MG tablet Take 2 mg by mouth daily.    Marland Kitchen tiotropium (SPIRIVA HANDIHALER) 18 MCG inhalation capsule Place 1 capsule (18 mcg total) into inhaler and inhale daily. 30 capsule 11  . varenicline (CHANTIX CONTINUING MONTH PAK) 1 MG  tablet Take 1 tablet (1 mg total) by mouth 2 (two) times daily. 60 tablet 3   No facility-administered medications prior to visit.     PAST MEDICAL HISTORY: Past Medical History:  Diagnosis Date  . Allergy   . Anxiety   . Asthma   . Bilateral carpal tunnel syndrome 12/31/2018  . Bipolar affective, manic ([place]) 1996  . COPD (chronic obstructive pulmonary disease) ([place])   . Depression   . HLD (hyperlipidemia) 03/12/2018  . Lung nodule, multiple 09/26/2016  . Manic depressive illness ([place]) 1996  . Seizures ([place])   . Substance abuse ([place])   . Tobacco abuse 10/04/2016    PAST SURGICAL HISTORY: Past Surgical History:  Procedure Laterality Date  . ABDOMINAL HYSTERECTOMY    . CARPAL TUNNEL RELEASE Right 01/15/2019   Procedure: RIGHTCARPAL TUNNEL RELEASE, LEFT DEQUERVAINS INJECTION;  Surgeon: Leandrew Koyanagi, MD;  Location: [facility];  Service: Orthopedics;  Laterality: Right;  . COLONOSCOPY WITH PROPOFOL N/A 11/28/2018   Procedure: COLONOSCOPY WITH PROPOFOL;  Surgeon: [name], MD;  Location: [facility] ENDOSCOPY;  Service: Gastroenterology;  Laterality: N/A;  . TUBAL LIGATION      FAMILY HISTORY: Family History  Problem Relation Age of Onset  . Stroke Mother   . Heart attack Father     SOCIAL HISTORY: Social History   Socioeconomic History  . Marital status: Married    Spouse name: Not on file  . Number of children: Not on file  . Years of education: Not on file  . Highest education level: Not on file  Occupational History  . Not on file  Social Needs  . Financial resource strain: Not on file  . Food insecurity    Worry: Not on file    Inability: Not on file  . Transportation needs    Medical: Not on file    Non-medical: Not on file  Tobacco Use  . Smoking status: Current Every Day Smoker    Packs/day: 1.00    Years: 36.00    Pack years: 36.00    Types: Cigarettes  . Smokeless tobacco: Former Systems developer    Types: Chew, Snuff  . Tobacco comment: Using chew  to wean herself off cigarettes  Substance and Sexual Activity  . Alcohol use: No    Comment: "Occasionally"  . Drug use: No    Types: "Crack" cocaine    Comment: history 2013  . Sexual activity: Yes    Birth control/protection: Condom, None  Lifestyle  . Physical activity    Days per week: Not on file    Minutes per session: Not on file  . Stress: Not on file  Relationships  . Social Herbalist on phone: Not on file    Gets together: Not on file    Attends religious service: Not on file    Active member of club or organization: Not on file    Attends meetings of clubs or organizations: Not on file  Relationship status: Not on file  . Intimate partner violence    Fear of current or ex partner: Not on file    Emotionally abused: Not on file    Physically abused: Not on file    Forced sexual activity: Not on file  Other Topics Concern  . Not on file  Social History Narrative  . Not on file   PHYSICAL EXAM  Vitals:   01/21/19 1328  BP: 122/80  Pulse: 92  Temp: (!) 97.5 F (36.4 C)  TempSrc: Oral  Weight: 176 lb 9.6 oz (80.1 kg)  Height: 5\' 6"  (1.676 m)   Body mass index is 28.5 kg/m.  Generalized: Well developed, in no acute distress   Neurological examination  Mentation: Alert oriented to time, place, history taking. Follows all commands speech and language fluent Cranial nerve II-XII: Pupils were equal round reactive to light. Extraocular movements were full, visual field were full on confrontational test. Facial sensation and strength were normal. Head turning and shoulder shrug were normal and symmetric. Motor: The motor testing reveals 5 over 5 strength of all 4 extremities. Good symmetric motor tone is noted throughout.  Sensory: Sensory testing is intact to soft touch on all 4 extremities. No evidence of extinction is noted.  Coordination: Cerebellar testing reveals good finger-nose-finger and heel-to-shin bilaterally.  Gait and station: Gait is  normal. Tandem gait is normal.  Reflexes: Deep tendon reflexes are symmetric and normal bilaterally.   DIAGNOSTIC DATA (LABS, IMAGING, TESTING) - I reviewed patient records, labs, notes, testing and imaging myself where available.  Lab Results  Component Value Date   WBC 8.4 11/13/2018   HGB 12.3 11/13/2018   HCT 38.3 11/13/2018   MCV 91 11/13/2018   PLT 308 11/13/2018      Component Value Date/Time   NA 140 12/18/2018 0939   NA 141 11/12/2013 1733   K 4.5 12/18/2018 0939   K 4.1 11/12/2013 1733   CL 103 12/18/2018 0939   CL 107 11/12/2013 1733   CO2 26 12/18/2018 0939   CO2 28 11/12/2013 1733   GLUCOSE 125 (H) 12/18/2018 0939   GLUCOSE 153 (H) 03/14/2018 0326   GLUCOSE 123 (H) 11/12/2013 1733   BUN 11 12/18/2018 0939   BUN 9 11/12/2013 1733   CREATININE 1.08 (H) 12/18/2018 0939   CREATININE 1.08 11/12/2013 1733   CALCIUM 9.7 12/18/2018 0939   CALCIUM 9.0 11/12/2013 1733   PROT 6.5 11/13/2018 1028   PROT 7.4 11/12/2013 1733   ALBUMIN 4.3 11/13/2018 1028   ALBUMIN 3.5 11/12/2013 1733   AST 12 11/13/2018 1028   AST 22 11/12/2013 1733   ALT 14 11/13/2018 1028   ALT 26 11/12/2013 1733   ALKPHOS 72 11/13/2018 1028   ALKPHOS 90 11/12/2013 1733   BILITOT <0.2 11/13/2018 1028   BILITOT < 0.1 (L) 11/12/2013 1733   GFRNONAA 59 (L) 12/18/2018 0939   GFRNONAA 58 (L) 11/12/2013 1733   GFRAA 68 12/18/2018 0939   GFRAA >60 11/12/2013 1733   Lab Results  Component Value Date   CHOL 162 11/13/2018   HDL 43 11/13/2018   LDLCALC 95 11/13/2018   TRIG 137 11/13/2018   CHOLHDL 3.8 11/13/2018   Lab Results  Component Value Date   HGBA1C 6.1 (H) 11/13/2018   No results found for: [id number] Lab Results  Component Value Date   TSH 1.030 05/11/2016      ASSESSMENT AND PLAN 52 y.o. year old female  has a past medical history of Allergy,  Anxiety, Asthma, Bilateral carpal tunnel syndrome (12/31/2018), Bipolar affective, manic ([place]) (1996), COPD (chronic obstructive pulmonary  disease) ([place]), Depression, HLD (hyperlipidemia) (03/12/2018), Lung nodule, multiple (09/26/2016), Manic depressive illness ([place]) (1996), Seizures ([place]), Substance abuse ([place]), and Tobacco abuse (10/04/2016). here with:  1.  Bilateral hand pain, paresthesia 2.  Neck pain -She had right carpal tunnel release with Dr. Erlinda Hong on 01/15/2019, along with injection to the left wrist -She has done well since surgery, no longer has pain or paresthesia to right wrist, still some to left wrist, but is  better -MRI of cervical spine shows multilevel degenerative changes, most severe at C5-6 with evidence of foraminal stenosis, EMG evaluation 12/31/2018 showed no evidence of cervical radiculopathy -Neck pain, radiating down right arm is better, less frequent, taking gabapentin 100 mg TID, will follow this at next visit, make sure doing well  -Return in 6 months or sooner if needed  I spent 15 minutes with the patient. 50% of this time was spent discussing her plan of care.  Butler Denmark, AGNP-C, DNP 01/21/2019, 1:58 PM [facility] [address], [place], [place] [address] [phone]

## 2019-01-21 ENCOUNTER — Other Ambulatory Visit: Payer: Self-pay

## 2019-01-21 ENCOUNTER — Ambulatory Visit (INDEPENDENT_AMBULATORY_CARE_PROVIDER_SITE_OTHER): Payer: Self-pay | Admitting: Neurology

## 2019-01-21 ENCOUNTER — Encounter: Payer: Self-pay | Admitting: Neurology

## 2019-01-21 VITALS — BP 122/80 | HR 92 | Temp 97.5°F | Ht 66.0 in | Wt 176.6 lb

## 2019-01-21 DIAGNOSIS — M542 Cervicalgia: Secondary | ICD-10-CM

## 2019-01-21 DIAGNOSIS — M79641 Pain in right hand: Secondary | ICD-10-CM

## 2019-01-21 DIAGNOSIS — M79642 Pain in left hand: Secondary | ICD-10-CM

## 2019-01-21 NOTE — Telephone Encounter (Signed)
Have we received any paperwork on patient?

## 2019-01-21 NOTE — Patient Instructions (Signed)
I am glad your symptoms are better  Continue follow-up with Dr. Erlinda Hong  Let's see you back in 6 months to see how the neck pain is doing, make sure is stable, improving

## 2019-01-21 NOTE — Telephone Encounter (Signed)
Called to advise no answer LMOM

## 2019-01-21 NOTE — Telephone Encounter (Signed)
Faxed to [facility] today

## 2019-01-22 ENCOUNTER — Other Ambulatory Visit: Payer: Self-pay

## 2019-01-22 ENCOUNTER — Ambulatory Visit (INDEPENDENT_AMBULATORY_CARE_PROVIDER_SITE_OTHER): Payer: Self-pay | Admitting: Physician Assistant

## 2019-01-22 ENCOUNTER — Telehealth: Payer: Self-pay | Admitting: Pharmacist

## 2019-01-22 ENCOUNTER — Encounter: Payer: Self-pay | Admitting: Orthopaedic Surgery

## 2019-01-22 DIAGNOSIS — M654 Radial styloid tenosynovitis [de Quervain]: Secondary | ICD-10-CM

## 2019-01-22 DIAGNOSIS — G5601 Carpal tunnel syndrome, right upper limb: Secondary | ICD-10-CM

## 2019-01-22 NOTE — Telephone Encounter (Signed)
01/22/2019 3:33:59 PM - Advair & Ventolin refill online with [facility]  01/22/2019 Placed refill online with [facility] for Advair 500/50 & Ventolin HFA, to ship 02/04/2019, order# [id number].[name]

## 2019-01-22 NOTE — Progress Notes (Signed)
Post-Op Visit Note   Patient: Erica Gomez           Date of Birth: October 04, 1965           MRN: [id number] Visit Date: 01/22/2019 PCP: Langston Reusing, NP   Assessment & Plan:  Chief Complaint:  Chief Complaint  Patient presents with  . Right Hand - Follow-up   Visit Diagnoses:  1. Right carpal tunnel syndrome   2. Tenosynovitis, de Quervain     Plan: Patient is a pleasant 53 year old female who presents our clinic today 1 week status post right carpal tunnel release and left de Quervain's cortisone injection, 01/15/2019.  She has been doing excellent.  No pain.  No fevers or chills.  Examination of her right wrist reveals a well-healing surgical incision.  No evidence of infection or cellulitis.  Today, the nylon sutures were removed.  Steri-Strips applied.  We have applied a removable forearm splint.  She will avoid any pushing, pulling or lifting as well as submerging her hand in water for the next 3 weeks.  Follow-up with [place] next week for wound check and possible scheduling of left carpal tunnel release.  Follow-Up Instructions: Return in about 1 week (around 01/29/2019).   Orders:  No orders of the defined types were placed in this encounter.  No orders of the defined types were placed in this encounter.   Imaging: No new imaging  PMFS History: Patient Active Problem List   Diagnosis Date Noted  . Carpal tunnel syndrome on right 01/14/2019  . De Quervain's tenosynovitis, left 01/14/2019  . Bilateral carpal tunnel syndrome 12/31/2018  . Pain in both hands 12/24/2018  . Neck pain on right side 12/24/2018  . Prediabetes 11/21/2018  . Bilateral hand pain 11/07/2018  . Health care maintenance 11/07/2018  . Neck pain 11/07/2018  . Anxiety 03/12/2018  . COPD with acute exacerbation ([place]) 03/12/2018  . HLD (hyperlipidemia) 03/12/2018  . CAD (coronary artery disease), native coronary artery 02/23/2017  . Chest pain 02/22/2017  . Chest pain with moderate risk for  cardiac etiology 02/22/2017  . Tobacco abuse 10/04/2016  . Lung nodule, multiple 09/26/2016  . Diabetes ([place]) 04/03/2014  . COPD (chronic obstructive pulmonary disease) ([place]) 12/17/2012  . Seizure ([place]) 12/17/2012   Past Medical History:  Diagnosis Date  . Allergy   . Anxiety   . Asthma   . Bilateral carpal tunnel syndrome 12/31/2018  . Bipolar affective, manic ([place]) 1996  . COPD (chronic obstructive pulmonary disease) ([place])   . Depression   . HLD (hyperlipidemia) 03/12/2018  . Lung nodule, multiple 09/26/2016  . Manic depressive illness ([place]) 1996  . Seizures ([place])   . Substance abuse ([place])   . Tobacco abuse 10/04/2016    Family History  Problem Relation Age of Onset  . Stroke Mother   . Heart attack Father     Past Surgical History:  Procedure Laterality Date  . ABDOMINAL HYSTERECTOMY    . CARPAL TUNNEL RELEASE Right 01/15/2019   Procedure: RIGHTCARPAL TUNNEL RELEASE, LEFT DEQUERVAINS INJECTION;  Surgeon: Leandrew Koyanagi, MD;  Location: [facility];  Service: Orthopedics;  Laterality: Right;  . COLONOSCOPY WITH PROPOFOL N/A 11/28/2018   Procedure: COLONOSCOPY WITH PROPOFOL;  Surgeon: [name], MD;  Location: [facility] ENDOSCOPY;  Service: Gastroenterology;  Laterality: N/A;  . TUBAL LIGATION     Social History   Occupational History  . Not on file  Tobacco Use  . Smoking status: Current Every Day Smoker  Packs/day: 1.00    Years: 36.00    Pack years: 36.00    Types: Cigarettes  . Smokeless tobacco: Former Systems developer    Types: Chew, Snuff  . Tobacco comment: Using chew to wean herself off cigarettes  Substance and Sexual Activity  . Alcohol use: No    Comment: "Occasionally"  . Drug use: No    Types: "Crack" cocaine    Comment: history 2013  . Sexual activity: Yes    Birth control/protection: Condom, None

## 2019-01-24 ENCOUNTER — Ambulatory Visit (INDEPENDENT_AMBULATORY_CARE_PROVIDER_SITE_OTHER): Payer: Self-pay | Admitting: Physician Assistant

## 2019-01-24 ENCOUNTER — Encounter: Payer: Self-pay | Admitting: Orthopaedic Surgery

## 2019-01-24 ENCOUNTER — Other Ambulatory Visit: Payer: Self-pay

## 2019-01-24 DIAGNOSIS — G5601 Carpal tunnel syndrome, right upper limb: Secondary | ICD-10-CM

## 2019-01-24 MED ORDER — MUPIROCIN 2 % EX OINT: TOPICAL_OINTMENT | CUTANEOUS | 0 refills | Status: DC

## 2019-01-24 NOTE — Progress Notes (Signed)
Post-Op Visit Note   Patient: Erica Gomez           Date of Birth: 1965-11-08           MRN: [id number] Visit Date: 01/24/2019 PCP: Langston Reusing, NP   Assessment & Plan:  Chief Complaint:  Chief Complaint  Patient presents with  . Right Hand - Pain   Visit Diagnoses:  1. Right carpal tunnel syndrome     Plan: Patient is a pleasant 53 year old female who comes in today with concerns about her left wrist.  She is 9 days status post right carpal tunnel release 01/15/2019.  She was seen in our office on 01/22/2019 where her sutures were removed.  She noticed wound dehiscence yesterday after taking a shower.  She does note that her hand was tightly wrapped and the incision did not get wet during her shower.  She does not have any increased pain.  No fevers or chills.  No purulent drainage to the wound.  She has been wearing her removable wrist splint at all times.  Examination of her right wrist reveals a dehisced wound.  There is some emaciation as well.  No erythema, drainage or signs of cellulitis or infection.  She is neurovascularly intact distally.  At this point, we will allow the wound to heal by secondary intention.  We will fill this wound with mupirocin and apply to the outside as well.  Will then cover with a Band-Aid.  She will do this twice daily.  She will continue wearing her wrist splint at all times.  No lifting or submerging her hand in water.  Follow-up with [place] in 1 week's time for recheck.  Call with any worsening symptoms in the meantime.  Follow-Up Instructions: Return in about 1 week (around 01/31/2019).   Orders:  No orders of the defined types were placed in this encounter.  Meds ordered this encounter  Medications  . mupirocin ointment (BACTROBAN) 2 %    Sig: Apply to affected area 2 times daily    Dispense:  22 g    Refill:  0    Imaging: No new imaging  PMFS History: Patient Active Problem List   Diagnosis Date Noted  . Carpal tunnel  syndrome on right 01/14/2019  . De Quervain's tenosynovitis, left 01/14/2019  . Bilateral carpal tunnel syndrome 12/31/2018  . Pain in both hands 12/24/2018  . Neck pain on right side 12/24/2018  . Prediabetes 11/21/2018  . Bilateral hand pain 11/07/2018  . Health care maintenance 11/07/2018  . Neck pain 11/07/2018  . Anxiety 03/12/2018  . COPD with acute exacerbation ([place]) 03/12/2018  . HLD (hyperlipidemia) 03/12/2018  . CAD (coronary artery disease), native coronary artery 02/23/2017  . Chest pain 02/22/2017  . Chest pain with moderate risk for cardiac etiology 02/22/2017  . Tobacco abuse 10/04/2016  . Lung nodule, multiple 09/26/2016  . Diabetes ([place]) 04/03/2014  . COPD (chronic obstructive pulmonary disease) ([place]) 12/17/2012  . Seizure ([place]) 12/17/2012   Past Medical History:  Diagnosis Date  . Allergy   . Anxiety   . Asthma   . Bilateral carpal tunnel syndrome 12/31/2018  . Bipolar affective, manic ([place]) 1996  . COPD (chronic obstructive pulmonary disease) ([place])   . Depression   . HLD (hyperlipidemia) 03/12/2018  . Lung nodule, multiple 09/26/2016  . Manic depressive illness ([place]) 1996  . Seizures ([place])   . Substance abuse ([place])   . Tobacco abuse 10/04/2016    Family History  Problem Relation Age of Onset  . Stroke Mother   . Heart attack Father     Past Surgical History:  Procedure Laterality Date  . ABDOMINAL HYSTERECTOMY    . CARPAL TUNNEL RELEASE Right 01/15/2019   Procedure: RIGHTCARPAL TUNNEL RELEASE, LEFT DEQUERVAINS INJECTION;  Surgeon: Leandrew Koyanagi, MD;  Location: [facility];  Service: Orthopedics;  Laterality: Right;  . COLONOSCOPY WITH PROPOFOL N/A 11/28/2018   Procedure: COLONOSCOPY WITH PROPOFOL;  Surgeon: [name], MD;  Location: [facility] ENDOSCOPY;  Service: Gastroenterology;  Laterality: N/A;  . TUBAL LIGATION     Social History   Occupational History  . Not on file  Tobacco Use  . Smoking status: Current Every Day Smoker     Packs/day: 1.00    Years: 36.00    Pack years: 36.00    Types: Cigarettes  . Smokeless tobacco: Former Systems developer    Types: Chew, Snuff  . Tobacco comment: Using chew to wean herself off cigarettes  Substance and Sexual Activity  . Alcohol use: No    Comment: "Occasionally"  . Drug use: No    Types: "Crack" cocaine    Comment: history 2013  . Sexual activity: Yes    Birth control/protection: Condom, None

## 2019-01-29 ENCOUNTER — Telehealth: Payer: Self-pay | Admitting: Orthopaedic Surgery

## 2019-01-30 NOTE — Telephone Encounter (Signed)
Yes she should come back to see [place] for follow up appt to look at surgical site and schedule surgery for the other hand.

## 2019-01-30 NOTE — Telephone Encounter (Signed)
See message.

## 2019-01-30 NOTE — Telephone Encounter (Signed)
Called patient no answer. LMOM. Advised her message below. She needs appt with Dr Erlinda Hong.

## 2019-02-10 ENCOUNTER — Telehealth: Payer: Self-pay | Admitting: Orthopaedic Surgery

## 2019-02-10 NOTE — Telephone Encounter (Signed)
Patient called. She is still in pain and wants to discuss extending her work restrictions.   Call back number: [phone]

## 2019-02-11 ENCOUNTER — Other Ambulatory Visit: Payer: Self-pay

## 2019-02-11 ENCOUNTER — Ambulatory Visit: Payer: Self-pay | Admitting: Specialist

## 2019-02-11 DIAGNOSIS — G5603 Carpal tunnel syndrome, bilateral upper limbs: Secondary | ICD-10-CM

## 2019-02-11 NOTE — Telephone Encounter (Signed)
Sure.  What does she do for work?

## 2019-02-11 NOTE — Progress Notes (Signed)
  Subjective:     Patient ID: Erica Gomez, female   DOB: [birth year], 53 y.o.   MRN: [id number]  HPI Had a R CTR on 01/15/19; her N/T has disappeared. She had a steroid injection to the L carpal tunnel and is improving.  Review of Systems     Objective:   Physical Exam  Her R palmar incision is C/D. I did not do any further physical exam as she is being treated by another physician.There is no need for her to return to this clinic for her CTS.    Assessment:        Plan:

## 2019-02-12 ENCOUNTER — Other Ambulatory Visit: Payer: Self-pay

## 2019-02-12 ENCOUNTER — Ambulatory Visit: Payer: Self-pay

## 2019-02-12 DIAGNOSIS — Z79899 Other long term (current) drug therapy: Secondary | ICD-10-CM

## 2019-02-12 NOTE — Progress Notes (Signed)
Medication Management Clinic Visit Note  Patient: Erica Gomez MRN: [id number] Date of Birth: 1965-08-02 PCP: Langston Reusing, NP   Trish Fountain 53 y.o. female presents for a telephone medication management visit with the pharmacist today.  There were no vitals taken for this visit.  Patient Information   Past Medical History:  Diagnosis Date  . Allergy   . Anxiety   . Asthma   . Bilateral carpal tunnel syndrome 12/31/2018  . Bipolar affective, manic ([place]) 1996  . COPD (chronic obstructive pulmonary disease) ([place])   . Depression   . HLD (hyperlipidemia) 03/12/2018  . Lung nodule, multiple 09/26/2016  . Manic depressive illness ([place]) 1996  . Seizures ([place])   . Substance abuse ([place])   . Tobacco abuse 10/04/2016      Past Surgical History:  Procedure Laterality Date  . ABDOMINAL HYSTERECTOMY    . CARPAL TUNNEL RELEASE Right 01/15/2019   Procedure: RIGHTCARPAL TUNNEL RELEASE, LEFT DEQUERVAINS INJECTION;  Surgeon: Leandrew Koyanagi, MD;  Location: [place];  Service: Orthopedics;  Laterality: Right;  . COLONOSCOPY WITH PROPOFOL N/A 11/28/2018   Procedure: COLONOSCOPY WITH PROPOFOL;  Surgeon: [name], MD;  Location: [facility] ENDOSCOPY;  Service: Gastroenterology;  Laterality: N/A;  . TUBAL LIGATION       Family History  Problem Relation Age of Onset  . Stroke Mother   . Heart attack Father     New Diagnoses (since last visit): carpal tunnel syndrome  Family Support: Good, husband involved in care   Social History   Substance and Sexual Activity  Alcohol Use No   Comment: "Occasionally"   Denies alcohol use at today's visit   Social History   Tobacco Use  Smoking Status Current Every Day Smoker  . Packs/day: 1.00  . Years: 36.00  . Pack years: 36.00  . Types: Cigarettes  Smokeless Tobacco Former Systems developer  . Types: Chew, Snuff  Tobacco Comment   Using chew to wean herself off cigarettes   No longer using chew or snuff   Health  Maintenance  Topic Date Due  . OPHTHALMOLOGY EXAM  09/29/1975  . TETANUS/TDAP  09/28/1984  . PAP SMEAR-Modifier  09/29/1986  . MAMMOGRAM  09/29/2015  . HEMOGLOBIN A1C  05/13/2019  . URINE MICROALBUMIN  11/13/2019  . FOOT EXAM  01/01/2020  . COLONOSCOPY  11/27/2021  . INFLUENZA VACCINE  Completed  . PNEUMOCOCCAL POLYSACCHARIDE VACCINE AGE 47-64 HIGH RISK  Completed  . HIV Screening  Completed   Outpatient Encounter Medications as of 02/12/2019  Medication Sig  . albuterol (VENTOLIN HFA) 108 (90 Base) MCG/ACT inhaler INHALE 2 PUFFS EVERY 4 TO 6 HOURS AS NEEDED  . aspirin EC 81 MG tablet Take 1 tablet (81 mg total) by mouth daily. No ibuprofen for at least one hour  . citalopram (CELEXA) 20 MG tablet Take 20 mg by mouth daily.  . Fluticasone-Salmeterol (ADVAIR DISKUS) 500-50 MCG/DOSE AEPB Inhale 1 puff into the lungs 2 (two) times daily.  Marland Kitchen gabapentin (NEURONTIN) 100 MG capsule Take 1 capsule (100 mg total) by mouth 3 (three) times daily.  Marland Kitchen ipratropium-albuterol (DUONEB) 0.5-2.5 (3) MG/3ML SOLN Take 3 mLs by nebulization every 6 (six) hours.  . metFORMIN (GLUCOPHAGE) 1000 MG tablet Take 0.5 tablets (500 mg total) by mouth daily with breakfast.  . risperiDONE (RISPERDAL) 2 MG tablet Take 2 mg by mouth daily.  Marland Kitchen tiotropium (SPIRIVA HANDIHALER) 18 MCG inhalation capsule Place 1 capsule (18 mcg total) into inhaler and inhale daily.  Marland Kitchen  varenicline (CHANTIX CONTINUING MONTH PAK) 1 MG tablet Take 1 tablet (1 mg total) by mouth 2 (two) times daily.  Marland Kitchen HYDROcodone-acetaminophen (NORCO) 5-325 MG tablet Take 1-2 tablets by mouth 3 (three) times daily as needed. (Patient not taking: Reported on 02/12/2019)  . mupirocin ointment (BACTROBAN) 2 % Apply to affected area 2 times daily (Patient not taking: Reported on 02/12/2019)   No facility-administered encounter medications on file as of 02/12/2019.    Health Maintenance/Date Completed  Last ED visit: 10/23/2018 (hand pain), 05/06/2018 (cough),  03/11/2018 (COPD exacerbation) Last Visit to PCP: 01/01/2019 Next Visit to PCP: 04/08/2019 Specialist Visit: Multiple visits in 01/2019 with orthopedics for right carpel tunnel syndrome, 01/20/2019 with oncology for lung nodule Dental Exam: Yes, last visit 12/17 per patient Eye Exam: Reading glasses. Patient endorses upcoming eye doctor appointment 04/07/2019 Pelvic/PAP Exam: None recently. Patient has a history of hysterectomy/oophorectomy? Mammogram: None recently, negative family hx of breast cancer DEXA: Never, no family hx of osteoporosis Colonoscopy: 11/28/2018 Flu Vaccine: Endorses receiving annual influenza vaccine Pneumonia Vaccine: 01/01/2019  Assessment and Plan:  1. COPD -Medications include fluticasone-salmeterol (Advair), tiotropium (Spiriva), albuterol HFA rescue inhaler, and ipratropium-albuterol (Duoneb) -Patient reports use of rescue inhaler 2-3x/day and nebulizer 2-3x/week -Worsening breathlessness in the winter due to the cold air -Hospitalized earlier this year 03/11/2018-03/14/2018 with COPD exacerbation -Still smoking 1 PPD  2. Bilateral carpal tunnel syndrome - Gabapentin 100 mg three times daily -01/15/2019 right carpal tunnel release and left de Quervain's cortisone injection -Followed by orthopedics  3. Diabetes - Last Hgb A1c was 6.1 on 11/13/2018 which was improved from 7 on 03/12/2018 - She is taking metformin 500 mg daily QAM with breakfast -Tolerating medication well besides some flatulence  -She takes a baby aspirin daily for ASCVD prevention -Not on a statin -Last albumin creatinine ratio was 4 on 11/13/2018 -She does have a family history of ASCVD (mother-stroke, father- heart attack)  4. Tobacco abuse -She reports she is trying to stop smoking. Currently smoking 1 PPD. -Previously tried Chantix which helped with the cravings and she had cut back to a couple of cigarettes per day. Stopped taking Chantix and urge to smoke returned.  -Reports that she  just ordered some nicotine patches but they have not arrived.   5. Lung nodule, multiple -Imaging with multiple pulmonary nodules. Referred to Pulmonary Nodule Clinic for monitoring  6. Bipolar disorder -Citalopram 20 mg daily and risperidone 2 mg daily  7. Adherence -Not requiring refills at this time -Recently acquired a pill box -Husband helps remind her to take medications. Reports that she had difficulty with compliance previously.   [place] Resident 12 February 2019

## 2019-02-13 NOTE — Telephone Encounter (Signed)
Tried to call patient no answer.could not LVM. Mailbox is full.  What does she do for work? And what does she want note to say?

## 2019-02-21 ENCOUNTER — Ambulatory Visit (INDEPENDENT_AMBULATORY_CARE_PROVIDER_SITE_OTHER): Payer: Self-pay | Admitting: Orthopaedic Surgery

## 2019-02-21 ENCOUNTER — Encounter (HOSPITAL_BASED_OUTPATIENT_CLINIC_OR_DEPARTMENT_OTHER)
Admission: RE | Admit: 2019-02-21 | Discharge: 2019-02-21 | Disposition: A | Discharge status: Home / Self Care | Payer: Self-pay | Source: Ambulatory Visit | Attending: Orthopaedic Surgery | Admitting: Orthopaedic Surgery

## 2019-02-21 ENCOUNTER — Encounter: Payer: Self-pay | Admitting: Orthopaedic Surgery

## 2019-02-21 ENCOUNTER — Other Ambulatory Visit: Payer: Self-pay

## 2019-02-21 DIAGNOSIS — Z01812 Encounter for preprocedural laboratory examination: Secondary | ICD-10-CM | POA: Insufficient documentation

## 2019-02-21 DIAGNOSIS — G5602 Carpal tunnel syndrome, left upper limb: Secondary | ICD-10-CM

## 2019-02-21 DIAGNOSIS — G5601 Carpal tunnel syndrome, right upper limb: Secondary | ICD-10-CM

## 2019-02-21 LAB — BASIC METABOLIC PANEL
Anion gap: 5 (ref 5–15)
BUN: 13 mg/dL (ref 6–20)
CO2: 30 mmol/L (ref 22–32)
Calcium: 10 mg/dL (ref 8.9–10.3)
Chloride: 103 mmol/L (ref 98–111)
Creatinine, Ser: 1.2 mg/dL — ABNORMAL HIGH (ref 0.44–1.00)
GFR calc Af Amer: 60 mL/min — ABNORMAL LOW (ref >60)
GFR calc non Af Amer: 52 mL/min — ABNORMAL LOW (ref >60)
Glucose, Bld: 103 mg/dL — ABNORMAL HIGH (ref 70–99)
Potassium: 5 mmol/L (ref 3.5–5.1)
Sodium: 138 mmol/L (ref 135–145)

## 2019-02-21 NOTE — Progress Notes (Signed)
Office Visit Note   Patient: Erica Gomez           Date of Birth: October 10, 1965           MRN: [id number] Visit Date: 02/21/2019              Requested by: Langston Reusing, NP [place],   [id number] PCP: Langston Reusing, NP   Assessment & Plan: Visit Diagnoses:  1. Left carpal tunnel syndrome   2. Right carpal tunnel syndrome     Plan: Impression is 5 weeks status post right carpal tunnel release and moderate left carpal tunnel syndrome.  Patient had recovered quite well from the right carpal tunnel surgery.  She will like to go ahead and schedule the left carpal tunnel release for the near future.  We will keep her out of work starting now until about 4 weeks after the left carpal tunnel release.  Follow-Up Instructions: No follow-ups on file.   Orders:  No orders of the defined types were placed in this encounter.  No orders of the defined types were placed in this encounter.     Procedures: No procedures performed   Clinical Data: No additional findings.   Subjective: Chief Complaint  Patient presents with  . Right Hand - Pain  . Left Hand - Pain    Erica Gomez is here for follow-up of her right carpal tunnel release and left carpal tunnel syndrome.  She is doing well from the right carpal tunnel release and the left de Quervain's injection.  She would like to move forward with scheduling for the left carpal tunnel release.  She has continued to be out of work due to her carpal tunnel syndrome and her symptoms do not allow her to adequately perform her job duties.   Review of Systems   Objective: Vital Signs: There were no vitals taken for this visit.  Physical Exam  Ortho Exam Right hand exam shows a fully healed surgical scar with minimal tenderness.  Neurovascular intact.  Minimal swelling.  Left hand exam is unchanged. Specialty Comments:  No specialty comments available.  Imaging: No results  found.   PMFS History: Patient Active Problem List   Diagnosis Date Noted  . Carpal tunnel syndrome on right 01/14/2019  . De Quervain's tenosynovitis, left 01/14/2019  . Bilateral carpal tunnel syndrome 12/31/2018  . Pain in both hands 12/24/2018  . Neck pain on right side 12/24/2018  . Prediabetes 11/21/2018  . Bilateral hand pain 11/07/2018  . Health care maintenance 11/07/2018  . Neck pain 11/07/2018  . Anxiety 03/12/2018  . COPD with acute exacerbation ([place]) 03/12/2018  . HLD (hyperlipidemia) 03/12/2018  . CAD (coronary artery disease), native coronary artery 02/23/2017  . Chest pain 02/22/2017  . Chest pain with moderate risk for cardiac etiology 02/22/2017  . Tobacco abuse 10/04/2016  . Lung nodule, multiple 09/26/2016  . Diabetes ([place]) 04/03/2014  . COPD (chronic obstructive pulmonary disease) ([place]) 12/17/2012  . Seizure ([place]) 12/17/2012   Past Medical History:  Diagnosis Date  . Allergy   . Anxiety   . Asthma   . Bilateral carpal tunnel syndrome 12/31/2018  . Bipolar affective, manic ([place]) 1996  . COPD (chronic obstructive pulmonary disease) ([place])   . Depression   . HLD (hyperlipidemia) 03/12/2018  . Lung nodule, multiple 09/26/2016  . Manic depressive illness ([place]) 1996  . Seizures ([place])   . Substance abuse ([place])   . Tobacco abuse 10/04/2016  Family History  Problem Relation Age of Onset  . Stroke Mother   . Heart attack Father     Past Surgical History:  Procedure Laterality Date  . ABDOMINAL HYSTERECTOMY    . CARPAL TUNNEL RELEASE Right 01/15/2019   Procedure: RIGHTCARPAL TUNNEL RELEASE, LEFT DEQUERVAINS INJECTION;  Surgeon: Leandrew Koyanagi, MD;  Location: [facility];  Service: Orthopedics;  Laterality: Right;  . COLONOSCOPY WITH PROPOFOL N/A 11/28/2018   Procedure: COLONOSCOPY WITH PROPOFOL;  Surgeon: [name], MD;  Location: [facility] ENDOSCOPY;  Service: Gastroenterology;  Laterality: N/A;  . TUBAL LIGATION     Social History    Occupational History  . Not on file  Tobacco Use  . Smoking status: Current Every Day Smoker    Packs/day: 1.00    Years: 36.00    Pack years: 36.00    Types: Cigarettes  . Smokeless tobacco: Former Systems developer    Types: Chew, Snuff  . Tobacco comment: Using chew to wean herself off cigarettes  Substance and Sexual Activity  . Alcohol use: No    Comment: "Occasionally"  . Drug use: No    Types: "Crack" cocaine    Comment: history 2013  . Sexual activity: Yes    Birth control/protection: Condom, None

## 2019-02-21 NOTE — Progress Notes (Signed)

## 2019-02-24 ENCOUNTER — Encounter
Admission: RE | Admit: 2019-02-24 | Discharge: 2019-02-24 | Disposition: A | Discharge status: Home / Self Care | Payer: HRSA Program | Source: Ambulatory Visit | Attending: Orthopaedic Surgery | Admitting: Orthopaedic Surgery

## 2019-02-24 ENCOUNTER — Other Ambulatory Visit: Payer: Self-pay

## 2019-02-24 DIAGNOSIS — Z20822 Contact with and (suspected) exposure to covid-19: Secondary | ICD-10-CM | POA: Diagnosis not present

## 2019-02-24 DIAGNOSIS — Z01812 Encounter for preprocedural laboratory examination: Secondary | ICD-10-CM | POA: Insufficient documentation

## 2019-02-24 LAB — SARS CORONAVIRUS 2 (TAT 6-24 HRS): SARS Coronavirus 2: NEGATIVE

## 2019-02-26 ENCOUNTER — Encounter (HOSPITAL_BASED_OUTPATIENT_CLINIC_OR_DEPARTMENT_OTHER): Payer: Self-pay | Admitting: Orthopaedic Surgery

## 2019-02-26 ENCOUNTER — Encounter (HOSPITAL_BASED_OUTPATIENT_CLINIC_OR_DEPARTMENT_OTHER)
Admission: RE | Disposition: A | Discharge status: Home / Self Care | Payer: Self-pay | Source: Home / Self Care | Attending: Orthopaedic Surgery

## 2019-02-26 ENCOUNTER — Ambulatory Visit (HOSPITAL_BASED_OUTPATIENT_CLINIC_OR_DEPARTMENT_OTHER)
Admission: RE | Admit: 2019-02-26 | Discharge: 2019-02-26 | Disposition: A | Discharge status: Home / Self Care | Payer: Self-pay | Attending: Orthopaedic Surgery | Admitting: Orthopaedic Surgery

## 2019-02-26 ENCOUNTER — Ambulatory Visit (HOSPITAL_BASED_OUTPATIENT_CLINIC_OR_DEPARTMENT_OTHER): Payer: Self-pay | Admitting: Anesthesiology

## 2019-02-26 ENCOUNTER — Other Ambulatory Visit: Payer: Self-pay

## 2019-02-26 DIAGNOSIS — F17210 Nicotine dependence, cigarettes, uncomplicated: Secondary | ICD-10-CM | POA: Insufficient documentation

## 2019-02-26 DIAGNOSIS — Z823 Family history of stroke: Secondary | ICD-10-CM | POA: Insufficient documentation

## 2019-02-26 DIAGNOSIS — E785 Hyperlipidemia, unspecified: Secondary | ICD-10-CM | POA: Insufficient documentation

## 2019-02-26 DIAGNOSIS — Z8249 Family history of ischemic heart disease and other diseases of the circulatory system: Secondary | ICD-10-CM | POA: Insufficient documentation

## 2019-02-26 DIAGNOSIS — Z79899 Other long term (current) drug therapy: Secondary | ICD-10-CM | POA: Insufficient documentation

## 2019-02-26 DIAGNOSIS — Z90710 Acquired absence of both cervix and uterus: Secondary | ICD-10-CM | POA: Insufficient documentation

## 2019-02-26 DIAGNOSIS — F419 Anxiety disorder, unspecified: Secondary | ICD-10-CM | POA: Insufficient documentation

## 2019-02-26 DIAGNOSIS — G5602 Carpal tunnel syndrome, left upper limb: Secondary | ICD-10-CM | POA: Insufficient documentation

## 2019-02-26 DIAGNOSIS — Z91018 Allergy to other foods: Secondary | ICD-10-CM | POA: Insufficient documentation

## 2019-02-26 DIAGNOSIS — Z7951 Long term (current) use of inhaled steroids: Secondary | ICD-10-CM | POA: Insufficient documentation

## 2019-02-26 DIAGNOSIS — F319 Bipolar disorder, unspecified: Secondary | ICD-10-CM | POA: Insufficient documentation

## 2019-02-26 DIAGNOSIS — I2510 Atherosclerotic heart disease of native coronary artery without angina pectoris: Secondary | ICD-10-CM | POA: Insufficient documentation

## 2019-02-26 DIAGNOSIS — R569 Unspecified convulsions: Secondary | ICD-10-CM | POA: Insufficient documentation

## 2019-02-26 DIAGNOSIS — Z7984 Long term (current) use of oral hypoglycemic drugs: Secondary | ICD-10-CM | POA: Insufficient documentation

## 2019-02-26 DIAGNOSIS — J449 Chronic obstructive pulmonary disease, unspecified: Secondary | ICD-10-CM | POA: Insufficient documentation

## 2019-02-26 DIAGNOSIS — Z7982 Long term (current) use of aspirin: Secondary | ICD-10-CM | POA: Insufficient documentation

## 2019-02-26 DIAGNOSIS — R918 Other nonspecific abnormal finding of lung field: Secondary | ICD-10-CM | POA: Insufficient documentation

## 2019-02-26 HISTORY — PX: CARPAL TUNNEL RELEASE: SHX101

## 2019-02-26 LAB — GLUCOSE, CAPILLARY
Glucose-Capillary: 101 mg/dL — ABNORMAL HIGH (ref 70–99)
Glucose-Capillary: 116 mg/dL — ABNORMAL HIGH (ref 70–99)

## 2019-02-26 SURGERY — CARPAL TUNNEL RELEASE
Anesthesia: Monitor Anesthesia Care | Site: Hand | Laterality: Left

## 2019-02-26 MED ORDER — FENTANYL CITRATE (PF) 100 MCG/2ML IJ SOLN
INTRAMUSCULAR | Status: DC | PRN
  Administered 2019-02-26: 100 ug via INTRAVENOUS

## 2019-02-26 MED ORDER — ONDANSETRON HCL 4 MG PO TABS: 4.0000 mg | ORAL_TABLET | Freq: Three times a day (TID) | ORAL | 0 refills | Status: DC | PRN

## 2019-02-26 MED ORDER — MEPERIDINE HCL 25 MG/ML IJ SOLN: 6.2500 mg | INTRAMUSCULAR | Status: DC | PRN

## 2019-02-26 MED ORDER — MIDAZOLAM HCL 2 MG/2ML IJ SOLN
INTRAMUSCULAR | Status: AC
  Filled 2019-02-26: qty 2

## 2019-02-26 MED ORDER — LACTATED RINGERS IV SOLN: INTRAVENOUS | Status: DC

## 2019-02-26 MED ORDER — CEFAZOLIN SODIUM-DEXTROSE 2-4 GM/100ML-% IV SOLN
2.0000 g | INTRAVENOUS | Status: AC
  Administered 2019-02-26: 2 g via INTRAVENOUS

## 2019-02-26 MED ORDER — HYDROCODONE-ACETAMINOPHEN 5-325 MG PO TABS: 1.0000 | ORAL_TABLET | Freq: Three times a day (TID) | ORAL | 0 refills | Status: DC | PRN

## 2019-02-26 MED ORDER — FENTANYL CITRATE (PF) 100 MCG/2ML IJ SOLN
INTRAMUSCULAR | Status: AC
  Filled 2019-02-26: qty 2

## 2019-02-26 MED ORDER — LIDOCAINE HCL (CARDIAC) PF 100 MG/5ML IV SOSY
PREFILLED_SYRINGE | INTRAVENOUS | Status: DC | PRN
  Administered 2019-02-26: 30 mg via INTRAVENOUS

## 2019-02-26 MED ORDER — OXYCODONE HCL 5 MG/5ML PO SOLN: 5.0000 mg | Freq: Once | ORAL | Status: DC | PRN

## 2019-02-26 MED ORDER — ONDANSETRON HCL 4 MG/2ML IJ SOLN: 4.0000 mg | Freq: Once | INTRAMUSCULAR | Status: DC | PRN

## 2019-02-26 MED ORDER — LIDOCAINE HCL (PF) 0.5 % IJ SOLN
INTRAMUSCULAR | Status: DC | PRN
  Administered 2019-02-26: 30 mL via INTRAVENOUS

## 2019-02-26 MED ORDER — ALBUTEROL SULFATE (2.5 MG/3ML) 0.083% IN NEBU
INHALATION_SOLUTION | RESPIRATORY_TRACT | Status: AC
  Filled 2019-02-26: qty 3

## 2019-02-26 MED ORDER — ONDANSETRON HCL 4 MG/2ML IJ SOLN
INTRAMUSCULAR | Status: DC | PRN
  Administered 2019-02-26: 4 mg via INTRAVENOUS

## 2019-02-26 MED ORDER — MIDAZOLAM HCL 5 MG/5ML IJ SOLN
INTRAMUSCULAR | Status: DC | PRN
  Administered 2019-02-26: 2 mg via INTRAVENOUS

## 2019-02-26 MED ORDER — FENTANYL CITRATE (PF) 100 MCG/2ML IJ SOLN: 25.0000 ug | INTRAMUSCULAR | Status: DC | PRN

## 2019-02-26 MED ORDER — PROPOFOL 500 MG/50ML IV EMUL
INTRAVENOUS | Status: DC | PRN
  Administered 2019-02-26: 100 ug/kg/min via INTRAVENOUS

## 2019-02-26 MED ORDER — CEFAZOLIN SODIUM-DEXTROSE 2-4 GM/100ML-% IV SOLN
INTRAVENOUS | Status: AC
  Filled 2019-02-26: qty 100

## 2019-02-26 MED ORDER — ALBUTEROL SULFATE (2.5 MG/3ML) 0.083% IN NEBU
2.5000 mg | INHALATION_SOLUTION | Freq: Four times a day (QID) | RESPIRATORY_TRACT | Status: DC | PRN
  Administered 2019-02-26: 2.5 mg via RESPIRATORY_TRACT

## 2019-02-26 MED ORDER — BUPIVACAINE HCL (PF) 0.25 % IJ SOLN
INTRAMUSCULAR | Status: DC | PRN
  Administered 2019-02-26: 30 mL

## 2019-02-26 MED ORDER — OXYCODONE HCL 5 MG PO TABS: 5.0000 mg | ORAL_TABLET | Freq: Once | ORAL | Status: DC | PRN

## 2019-02-26 SURGICAL SUPPLY — 46 items
BAND RUBBER #18 3X1/16 STRL (MISCELLANEOUS) ×4 IMPLANT
BLADE MINI RND TIP GREEN BEAV (BLADE) ×2 IMPLANT
BLADE SURG 15 STRL LF DISP TIS (BLADE) ×1 IMPLANT
BLADE SURG 15 STRL SS (BLADE) ×1
BNDG ELASTIC 3X5.8 VLCR STR LF (GAUZE/BANDAGES/DRESSINGS) ×2 IMPLANT
BNDG ESMARK 4X9 LF (GAUZE/BANDAGES/DRESSINGS) IMPLANT
BNDG PLASTER X FAST 3X3 WHT LF (CAST SUPPLIES) IMPLANT
BRUSH SCRUB EZ PLAIN DRY (MISCELLANEOUS) ×2 IMPLANT
CANISTER SUCT 1200ML W/VALVE (MISCELLANEOUS) IMPLANT
CORD BIPOLAR FORCEPS 12FT (ELECTRODE) ×2 IMPLANT
COVER BACK TABLE 60X90IN (DRAPES) ×2 IMPLANT
COVER MAYO STAND STRL (DRAPES) ×2 IMPLANT
COVER WAND RF STERILE (DRAPES) IMPLANT
CUFF TOURN SGL QUICK 18X4 (TOURNIQUET CUFF) ×2 IMPLANT
DECANTER SPIKE VIAL GLASS SM (MISCELLANEOUS) IMPLANT
DRAPE EXTREMITY T 121X128X90 (DISPOSABLE) ×2 IMPLANT
DRAPE IMP U-DRAPE 54X76 (DRAPES) ×2 IMPLANT
DRAPE SURG 17X23 STRL (DRAPES) ×2 IMPLANT
GAUZE 4X4 16PLY RFD (DISPOSABLE) IMPLANT
GAUZE SPONGE 4X4 12PLY STRL (GAUZE/BANDAGES/DRESSINGS) ×2 IMPLANT
GAUZE XEROFORM 1X8 LF (GAUZE/BANDAGES/DRESSINGS) ×2 IMPLANT
GLOVE BIO SURGEON STRL SZ7 (GLOVE) ×2 IMPLANT
GLOVE BIOGEL PI IND STRL 7.0 (GLOVE) ×2 IMPLANT
GLOVE BIOGEL PI INDICATOR 7.0 (GLOVE) ×2
GLOVE ECLIPSE 7.0 STRL STRAW (GLOVE) ×2 IMPLANT
GLOVE EXAM NITRILE MD LF STRL (GLOVE) ×2 IMPLANT
GLOVE SKINSENSE NS SZ7.5 (GLOVE) ×1
GLOVE SKINSENSE STRL SZ7.5 (GLOVE) ×1 IMPLANT
GLOVE SURG SYN 7.5  E (GLOVE) ×1
GLOVE SURG SYN 7.5 E (GLOVE) ×1 IMPLANT
GOWN STRL REIN XL XLG (GOWN DISPOSABLE) ×2 IMPLANT
GOWN STRL REUS W/ TWL XL LVL3 (GOWN DISPOSABLE) ×2 IMPLANT
GOWN STRL REUS W/TWL XL LVL3 (GOWN DISPOSABLE) ×2
NEEDLE HYPO 25X1 1.5 SAFETY (NEEDLE) ×2 IMPLANT
NS IRRIG 1000ML POUR BTL (IV SOLUTION) ×2 IMPLANT
PACK BASIN DAY SURGERY FS (CUSTOM PROCEDURE TRAY) ×2 IMPLANT
PAD CAST 3X4 CTTN HI CHSV (CAST SUPPLIES) ×1 IMPLANT
PADDING CAST COTTON 3X4 STRL (CAST SUPPLIES) ×1
STOCKINETTE 4X48 STRL (DRAPES) ×2 IMPLANT
SUT ETHILON 3 0 PS 1 (SUTURE) ×2 IMPLANT
SYR BULB 3OZ (MISCELLANEOUS) ×2 IMPLANT
SYR CONTROL 10ML LL (SYRINGE) ×2 IMPLANT
TOWEL GREEN STERILE FF (TOWEL DISPOSABLE) ×2 IMPLANT
TRAY DSU PREP LF (CUSTOM PROCEDURE TRAY) ×2 IMPLANT
TUBE CONNECTING 20X1/4 (TUBING) IMPLANT
UNDERPAD 30X36 HEAVY ABSORB (UNDERPADS AND DIAPERS) ×2 IMPLANT

## 2019-02-26 NOTE — Op Note (Signed)
   Carpal tunnel op note  DATE OF SURGERY:02/26/2019  PREOPERATIVE DIAGNOSIS:  Left carpal tunnel syndrome  POSTOPERATIVE DIAGNOSIS: same  PROCEDURE:  Left carpal tunnel release. CPT [phone]  SURGEON: Surgeon(s): Leandrew Koyanagi, MD  ASSIST: Madalyn Rob, PA-C; necessary for the timely completion of procedure and due to complexity of procedure.  ANESTHESIA:  Regional  TOURNIQUET TIME: less than 20 minutes  BLOOD LOSS: Minimal.  COMPLICATIONS: None.  PATHOLOGY: None.  INDICATIONS: The patient is a 54 y.o. -year-old female who presented with carpal tunnel syndrome failing nonsurgical management, indicated for surgical release.  DESCRIPTION OF PROCEDURE: The patient was identified in the preoperative holding area.  The operative site was marked by the surgeon and confirmed by the patient.  He was brought back to the operating room.  Anesthesia was induced by the anesthesia team.  A well padded nonsterile tourniquet was placed. The operative extremity was prepped and draped in standard sterile fashion.  A timeout was performed.  Preoperative antibiotics were given.   A palmar incision was made about 5 mm ulnar to the thenar crease.  The palmar aponeurosis was exposed and divided in line with the skin incision. The palmaris brevis was visualized and divided.  The distal edge of the transcarpal ligament was identified. A hemostat was inserted into the carpal tunnel to protect the median nerve and the flexor tendons. Then, the transverse carpal ligament was released under direct visualization. Proximally, a subcutaneous tunnel was made allowing a Sewell retractor to be placed. Then, the distal portion of the antebrachial fascia was released. Distally, all fibrous bands were released. The median nerve was visualized, and the fat pad was exposed. Following release, local infiltration with 0.25% of Sensorcaine was given. The tourniquet was deflated. Hemostasis achieved.  Wound was irrigated  and closed with 4-0 nylon sutures. Sterile dressing applied. The patient was transferred to the recovery room in stable condition after all counts were correct.  POSTOPERATIVE PLAN: To start nerve gliding exercises as tolerated and no heavy lifting for four weeks.  Eduard Roux, M.D. [facility] 11:32 AM

## 2019-02-26 NOTE — Discharge Instructions (Signed)
Postoperative instructions:  Weightbearing instructions: no lifting more than 10 lbs.  Dressing instructions: Keep your dressing and/or splint clean and dry at all times.  It will be removed at your first post-operative appointment.  Your stitches and/or staples will be removed at this visit.  Incision instructions:  Do not soak your incision for 3 weeks after surgery.  If the incision gets wet, pat dry and do not scrub the incision.  Pain control:  You have been given a prescription to be taken as directed for post-operative pain control.  In addition, elevate the operative extremity above the heart at all times to prevent swelling and throbbing pain.  Take over-the-counter Colace, 100mg  by mouth twice a day while taking narcotic pain medications to help prevent constipation.  Follow up appointments: 1) 7 days for wound check. 2) Dr. Erlinda Hong as scheduled.   -------------------------------------------------------------------------------------------------------------  After Surgery Pain Control:  After your surgery, post-surgical discomfort or pain is likely. This discomfort can last several days to a few weeks. At certain times of the day your discomfort may be more intense.  Did you receive a nerve block?  A nerve block can provide pain relief for one hour to two days after your surgery. As long as the nerve block is working, you will experience little or no sensation in the area the surgeon operated on.  As the nerve block wears off, you will begin to experience pain or discomfort. It is very important that you begin taking your prescribed pain medication before the nerve block fully wears off. Treating your pain at the first sign of the block wearing off will ensure your pain is better controlled and more tolerable when full-sensation returns. Do not wait until the pain is intolerable, as the medicine will be less effective. It is better to treat pain in advance than to try and catch up.    General Anesthesia:  If you did not receive a nerve block during your surgery, you will need to start taking your pain medication shortly after your surgery and should continue to do so as prescribed by your surgeon.  Pain Medication:  Most commonly we prescribe Vicodin and Percocet for post-operative pain. Both of these medications contain a combination of acetaminophen (Tylenol) and a narcotic to help control pain.   It takes between 30 and 45 minutes before pain medication starts to work. It is important to take your medication before your pain level gets too intense.   Nausea is a common side effect of many pain medications. You will want to eat something before taking your pain medicine to help prevent nausea.   If you are taking a prescription pain medication that contains acetaminophen, we recommend that you do not take additional over the counter acetaminophen (Tylenol).  Other pain relieving options:   Using a cold pack to ice the affected area a few times a day (15 to 20 minutes at a time) can help to relieve pain, reduce swelling and bruising.   Elevation of the affected area can also help to reduce pain and swelling.      Post Anesthesia Home Care Instructions  Activity: Get plenty of rest for the remainder of the day. A responsible individual must stay with you for 24 hours following the procedure.  For the next 24 hours, DO NOT: -Drive a car -Paediatric nurse -Drink alcoholic beverages -Take any medication unless instructed by your physician -Make any legal decisions or sign important papers.  Meals: Start with liquid foods  such as gelatin or soup. Progress to regular foods as tolerated. Avoid greasy, spicy, heavy foods. If nausea and/or vomiting occur, drink only clear liquids until the nausea and/or vomiting subsides. Call your physician if vomiting continues.  Special Instructions/Symptoms: Your throat may feel dry or sore from the anesthesia or the breathing  tube placed in your throat during surgery. If this causes discomfort, gargle with warm salt water. The discomfort should disappear within 24 hours.  If you had a scopolamine patch placed behind your ear for the management of post- operative nausea and/or vomiting:  1. The medication in the patch is effective for 72 hours, after which it should be removed.  Wrap patch in a tissue and discard in the trash. Wash hands thoroughly with soap and water. 2. You may remove the patch earlier than 72 hours if you experience unpleasant side effects which may include dry mouth, dizziness or visual disturbances. 3. Avoid touching the patch. Wash your hands with soap and water after contact with the patch.      Call your surgeon if you experience:   1.  Fever over 101.0. 2.  Inability to urinate. 3.  Nausea and/or vomiting. 4.  Extreme swelling or bruising at the surgical site. 5.  Continued bleeding from the incision. 6.  Increased pain, redness or drainage from the incision. 7.  Problems related to your pain medication. 8.  Any problems and/or concerns

## 2019-02-26 NOTE — Anesthesia Procedure Notes (Signed)
Procedure Name: MAC Date/Time: 02/26/2019 11:28 AM Performed by: Signe Colt, CRNA Pre-anesthesia Checklist: Patient identified, Emergency Drugs available, Suction available, Patient being monitored and Timeout performed Patient Re-evaluated:Patient Re-evaluated prior to induction Oxygen Delivery Method: Simple face mask

## 2019-02-26 NOTE — Anesthesia Procedure Notes (Signed)
Anesthesia Regional Block: Bier block (IV Regional)   Pre-Anesthetic Checklist: ,, timeout performed, Correct Patient, Correct Site, Correct Laterality, Correct Procedure,, site marked, surgical consent,, at surgeon's request  Laterality: Left     Needles:  Injection technique: Single-shot  Needle Type: Other      Needle Gauge: 20     Additional Needles:   Procedures:,,,,, intact distal pulses, Esmarch exsanguination, single tourniquet utilized,  Narrative:   Performed by: Personally       

## 2019-02-26 NOTE — H&P (Signed)
PREOPERATIVE H&P  Chief Complaint: left carpal tunnel sydrome  HPI: Erica Gomez is a 54 y.o. female who presents for surgical treatment of left carpal tunnel sydrome.  She denies any changes in medical history.  Past Medical History:  Diagnosis Date  . Allergy   . Anxiety   . Asthma   . Bilateral carpal tunnel syndrome 12/31/2018  . Bipolar affective, manic ([place]) 1996  . COPD (chronic obstructive pulmonary disease) ([place])   . Depression   . HLD (hyperlipidemia) 03/12/2018  . Lung nodule, multiple 09/26/2016  . Manic depressive illness ([place]) 1996  . Seizures ([place])   . Substance abuse ([place])   . Tobacco abuse 10/04/2016   Past Surgical History:  Procedure Laterality Date  . ABDOMINAL HYSTERECTOMY    . CARPAL TUNNEL RELEASE Right 01/15/2019   Procedure: RIGHTCARPAL TUNNEL RELEASE, LEFT DEQUERVAINS INJECTION;  Surgeon: Leandrew Koyanagi, MD;  Location: [facility];  Service: Orthopedics;  Laterality: Right;  . COLONOSCOPY WITH PROPOFOL N/A 11/28/2018   Procedure: COLONOSCOPY WITH PROPOFOL;  Surgeon: [name], MD;  Location: [facility] ENDOSCOPY;  Service: Gastroenterology;  Laterality: N/A;  . TUBAL LIGATION     Social History   Socioeconomic History  . Marital status: Married    Spouse name: Not on file  . Number of children: Not on file  . Years of education: Not on file  . Highest education level: Not on file  Occupational History  . Not on file  Tobacco Use  . Smoking status: Current Every Day Smoker    Packs/day: 1.00    Years: 36.00    Pack years: 36.00    Types: Cigarettes  . Smokeless tobacco: Former Systems developer    Types: Chew, Snuff  . Tobacco comment: Using chew to wean herself off cigarettes  Substance and Sexual Activity  . Alcohol use: No    Comment: "Occasionally"  . Drug use: No    Types: "Crack" cocaine    Comment: history 2013  . Sexual activity: Yes    Birth control/protection: Condom, None  Other Topics Concern  . Not on file  Social  History Narrative  . Not on file   Social Determinants of Health   Financial Resource Strain:   . Difficulty of Paying Living Expenses: Not on file  Food Insecurity:   . Worried About Charity fundraiser in the Last Year: Not on file  . Ran Out of Food in the Last Year: Not on file  Transportation Needs:   . Lack of Transportation (Medical): Not on file  . Lack of Transportation (Non-Medical): Not on file  Physical Activity:   . Days of Exercise per Week: Not on file  . Minutes of Exercise per Session: Not on file  Stress:   . Feeling of Stress : Not on file  Social Connections:   . Frequency of Communication with Friends and Family: Not on file  . Frequency of Social Gatherings with Friends and Family: Not on file  . Attends Religious Services: Not on file  . Active Member of Clubs or Organizations: Not on file  . Attends Archivist Meetings: Not on file  . Marital Status: Not on file   Family History  Problem Relation Age of Onset  . Stroke Mother   . Heart attack Father    Allergies  Allergen Reactions  . Flax Seed Oil [Bio-Flax] Hives    Pt. Self reported   Prior to Admission medications   Medication Sig  Start Date End Date Taking? Authorizing Provider  albuterol (VENTOLIN HFA) 108 (90 Base) MCG/ACT inhaler INHALE 2 PUFFS EVERY 4 TO 6 HOURS AS NEEDED 11/07/18  Yes Iloabachie, Chioma E, NP  aspirin EC 81 MG tablet Take 1 tablet (81 mg total) by mouth daily. No ibuprofen for at least one hour 09/26/16  Yes Lada, Satira Anis, MD  citalopram (CELEXA) 20 MG tablet Take 20 mg by mouth daily.   Yes [provider]  Fluticasone-Salmeterol (ADVAIR DISKUS) 500-50 MCG/DOSE AEPB Inhale 1 puff into the lungs 2 (two) times daily. 11/07/18  Yes Iloabachie, Chioma E, NP  gabapentin (NEURONTIN) 100 MG capsule Take 1 capsule (100 mg total) by mouth 3 (three) times daily. 01/01/19  Yes Iloabachie, Chioma E, NP  ipratropium-albuterol (DUONEB) 0.5-2.5 (3) MG/3ML SOLN Take 3  mLs by nebulization every 6 (six) hours. 03/14/18  Yes Dustin Flock, MD  metFORMIN (GLUCOPHAGE) 1000 MG tablet Take 0.5 tablets (500 mg total) by mouth daily with breakfast. 01/01/19  Yes Iloabachie, Chioma E, NP  risperiDONE (RISPERDAL) 2 MG tablet Take 2 mg by mouth daily.   Yes [provider]  tiotropium (SPIRIVA HANDIHALER) 18 MCG inhalation capsule Place 1 capsule (18 mcg total) into inhaler and inhale daily. 11/07/18 11/07/19 Yes Iloabachie, Chioma E, NP     Positive ROS: All other systems have been reviewed and were otherwise negative with the exception of those mentioned in the HPI and as above.  Physical Exam: General: Alert, no acute distress Cardiovascular: No pedal edema Respiratory: No cyanosis, no use of accessory musculature GI: abdomen soft Skin: No lesions in the area of chief complaint Neurologic: Sensation intact distally Psychiatric: Patient is competent for consent with normal mood and affect Lymphatic: no lymphedema  MUSCULOSKELETAL: exam stable  Assessment: left carpal tunnel sydrome  Plan: Plan for Procedure(s): LEFT CARPAL TUNNEL RELEASE  The risks benefits and alternatives were discussed with the patient including but not limited to the risks of nonoperative treatment, versus surgical intervention including infection, bleeding, nerve injury,  blood clots, cardiopulmonary complications, morbidity, mortality, among others, and they were willing to proceed.   Eduard Roux, MD   02/26/2019 7:38 AM

## 2019-02-26 NOTE — Anesthesia Preprocedure Evaluation (Signed)
Anesthesia Evaluation  Patient identified by MRN, date of birth, ID band Patient awake    Reviewed: Allergy & Precautions, H&P , NPO status , Patient's Chart, lab work & pertinent test results  Airway Mallampati: II  TM Distance: >3 FB Neck ROM: Full    Dental no notable dental hx. (+) Teeth Intact, Dental Advisory Given   Pulmonary asthma , COPD,  COPD inhaler, Current Smoker,  Multiple pulmonary nodules   breath sounds clear to auscultation       Cardiovascular + CAD   Rhythm:Regular Rate:Normal  TTE (2015): EF 65%. Normal LV function   Neuro/Psych Seizures -,  PSYCHIATRIC DISORDERS Anxiety Depression Bipolar Disorder Left CTS  Neuromuscular disease    GI/Hepatic negative GI ROS, (+)     substance abuse  ,   Endo/Other  diabetes, Well Controlled, Type 2, Oral Hypoglycemic AgentsHyperlipidemia  Renal/GU negative Renal ROS  negative genitourinary   Musculoskeletal negative musculoskeletal ROS (+)   Abdominal   Peds  Hematology negative hematology ROS (+)   Anesthesia Other Findings   Reproductive/Obstetrics                             Anesthesia Physical  Anesthesia Plan  ASA: III  Anesthesia Plan: MAC and Bier Block and Bier Block-LIDOCAINE ONLY   Post-op Pain Management:    Induction: Intravenous  PONV Risk Score and Plan: 1 and Propofol infusion, Treatment may vary due to age or medical condition and Midazolam  Airway Management Planned: Simple Face Mask, Nasal Cannula and Natural Airway  Additional Equipment:   Intra-op Plan:   Post-operative Plan:   Informed Consent: I have reviewed the patients History and Physical, chart, labs and discussed the procedure including the risks, benefits and alternatives for the proposed anesthesia with the patient or authorized representative who has indicated his/her understanding and acceptance.     Dental advisory given  Plan  Discussed with: CRNA, Anesthesiologist and Surgeon  Anesthesia Plan Comments:         Anesthesia Quick Evaluation

## 2019-02-26 NOTE — Transfer of Care (Signed)
Immediate Anesthesia Transfer of Care Note  Patient: Erica Gomez  Procedure(s) Performed: LEFT CARPAL TUNNEL RELEASE (Left Hand)  Patient Location: PACU  Anesthesia Type:Bier block  Level of Consciousness: awake, alert , oriented and patient cooperative  Airway & Oxygen Therapy: Patient Spontanous Breathing and Patient connected to face mask oxygen  Post-op Assessment: Report given to RN and Post -op Vital signs reviewed and stable  Post vital signs: Reviewed and stable  Last Vitals:  Vitals Value Taken Time  BP 143/93 02/26/19 1142  Temp    Pulse 83 02/26/19 1143  Resp 14 02/26/19 1143  SpO2 98 % 02/26/19 1143  Vitals shown include unvalidated device data.  Last Pain:  Vitals:   02/26/19 0955  TempSrc: Temporal  PainSc: 0-No pain      Patients Stated Pain Goal: 0 ([id number])  Complications: No apparent anesthesia complications

## 2019-02-26 NOTE — Anesthesia Postprocedure Evaluation (Signed)
Anesthesia Post Note  Patient: Erica Gomez  Procedure(s) Performed: LEFT CARPAL TUNNEL RELEASE (Left Hand)     Patient location during evaluation: PACU Anesthesia Type: MAC Level of consciousness: awake and alert and oriented Pain management: pain level controlled Vital Signs Assessment: post-procedure vital signs reviewed and stable Respiratory status: spontaneous breathing, nonlabored ventilation and respiratory function stable Cardiovascular status: stable and blood pressure returned to baseline Postop Assessment: no apparent nausea or vomiting Anesthetic complications: no    Last Vitals:  Vitals:   02/26/19 1142 02/26/19 1145  BP: (!) 143/93 138/72  Pulse: 84 84  Resp: 13 17  Temp: 37 C   SpO2: 98% 99%    Last Pain:  Vitals:   02/26/19 1145  TempSrc:   PainSc: 0-No pain                 Madyson Lukach A.

## 2019-02-27 ENCOUNTER — Telehealth: Payer: Self-pay

## 2019-02-27 ENCOUNTER — Encounter: Payer: Self-pay | Admitting: *Deleted

## 2019-02-27 NOTE — Telephone Encounter (Signed)
See message.

## 2019-02-27 NOTE — Telephone Encounter (Signed)
Patient called stating that she was having some right calf pain earlier this morning, but now the pain has stopped.  Patient had left carpal tunnel release on Wednesday, 02/26/2019.  Would like a CB at [phone].  Please advise.  Thank you.

## 2019-02-28 NOTE — Telephone Encounter (Signed)
Spoke to her.

## 2019-03-03 NOTE — Progress Notes (Signed)
Cardiology Office Note  Date:  03/04/2019   ID:  Erica Gomez, DOB October 23, 1965, MRN [id number]  PCP:  Langston Reusing, NP   Chief Complaint  Patient presents with  . other    12 month follow up. Meds reviewed by the pt. verbally. "doing well."     HPI:  54 yo woman with PMH of  Smoking, 1/2 ppd, substance abuse Type 2 diabetes Bipolar/depression Seizures Cough syncope 09/2012 CT scan chest 2018 with significant coronary calcification three-vessel, worse in the LAD Presenting for f/u of her chest pain  Still smoking, was the 1/2 pack a day now up to 1 pack/day Ran out of her Crestor  Carpal tunnel surgery bilaterally still recovering on the left Doing some work, packing boxes, works in a plant  Denies any chest wall pain, chest tightness, Denies shortness of breath leg swelling  Not as much exercise as she used to  CT scan chest October 2018 reviewed with her in detail Significant mid to distal LAD calcification, proximal RCA No significant atherosclerosis in the aorta   Previous Echo 02/2013 reviewed with her  EF 48 - 65%  EKG personally reviewed by myself on todays visit Shows normal sinus rhythm rate 90 bpm no significant ST or T wave changes  PMH:   has a past medical history of Allergy, Anxiety, Asthma, Bilateral carpal tunnel syndrome (12/31/2018), Bipolar affective, manic ([place]) (1996), COPD (chronic obstructive pulmonary disease) ([place]), Depression, HLD (hyperlipidemia) (03/12/2018), Lung nodule, multiple (09/26/2016), Manic depressive illness ([place]) (1996), Seizures ([place]), Substance abuse ([place]), and Tobacco abuse (10/04/2016).  PSH:    Past Surgical History:  Procedure Laterality Date  . ABDOMINAL HYSTERECTOMY    . CARPAL TUNNEL RELEASE Right 01/15/2019   Procedure: RIGHTCARPAL TUNNEL RELEASE, LEFT DEQUERVAINS INJECTION;  Surgeon: Leandrew Koyanagi, MD;  Location: [facility];  Service: Orthopedics;  Laterality: Right;  . CARPAL TUNNEL RELEASE Left  02/26/2019   Procedure: LEFT CARPAL TUNNEL RELEASE;  Surgeon: Leandrew Koyanagi, MD;  Location: [facility];  Service: Orthopedics;  Laterality: Left;  Bier block  . COLONOSCOPY WITH PROPOFOL N/A 11/28/2018   Procedure: COLONOSCOPY WITH PROPOFOL;  Surgeon: [name], MD;  Location[facility] ENDOSCOPY;  Service: Gastroenterology;  Laterality: N/A;  . TUBAL LIGATION      Current Outpatient Medications  Medication Sig Dispense Refill  . albuterol (VENTOLIN HFA) 108 (90 Base) MCG/ACT inhaler INHALE 2 PUFFS EVERY 4 TO 6 HOURS AS NEEDED 54 g 0  . aspirin EC 81 MG tablet Take 1 tablet (81 mg total) by mouth daily. No ibuprofen for at least one hour    . citalopram (CELEXA) 20 MG tablet Take 20 mg by mouth daily.    . Fluticasone-Salmeterol (ADVAIR DISKUS) 500-50 MCG/DOSE AEPB Inhale 1 puff into the lungs 2 (two) times daily. 3 each 1  . gabapentin (NEURONTIN) 100 MG capsule Take 1 capsule (100 mg total) by mouth 3 (three) times daily. 30 capsule 2  . HYDROcodone-acetaminophen (NORCO) 5-325 MG tablet Take 1-2 tablets by mouth 3 (three) times daily as needed. 20 tablet 0  . ipratropium-albuterol (DUONEB) 0.5-2.5 (3) MG/3ML SOLN Take 3 mLs by nebulization every 6 (six) hours. 360 mL 2  . metFORMIN (GLUCOPHAGE) 1000 MG tablet Take 0.5 tablets (500 mg total) by mouth daily with breakfast. 30 tablet 3  . ondansetron (ZOFRAN) 4 MG tablet Take 1-2 tablets (4-8 mg total) by mouth every 8 (eight) hours as needed for nausea or vomiting. 20 tablet 0  .  risperiDONE (RISPERDAL) 2 MG tablet Take 2 mg by mouth daily.    Marland Kitchen tiotropium (SPIRIVA HANDIHALER) 18 MCG inhalation capsule Place 1 capsule (18 mcg total) into inhaler and inhale daily. 30 capsule 11   No current facility-administered medications for this visit.    Allergies:   Flax seed oil [bio-flax]   Social History:  The patient  reports that she has been smoking cigarettes. She has a 36.00 pack-year smoking history. She has quit using smokeless  tobacco.  Her smokeless tobacco use included chew and snuff. She reports that she does not drink alcohol or use drugs.   Family History:   family history includes Heart attack in her father; Stroke in her mother.    Review of Systems: Review of Systems  Constitutional: Negative.   Respiratory: Negative.   Cardiovascular: Negative.   Gastrointestinal: Negative.   Musculoskeletal: Negative.   Neurological: Negative.   Psychiatric/Behavioral: Negative.   All other systems reviewed and are negative.   PHYSICAL EXAM: VS:  BP 100/64 (BP Location: Left Arm, Patient Position: Sitting, Cuff Size: Normal)   Pulse 90   Ht 5\' 6"  (1.676 m)   Wt 178 lb 4 oz (80.9 kg)   BMI 28.77 kg/m  , BMI Body mass index is 28.77 kg/m. GEN: Well nourished, well developed, in no acute distress  HEENT: normal  Neck: no JVD, carotid bruits, or masses Cardiac: RRR; no murmurs, rubs, or gallops,no edema  Respiratory:  clear to auscultation bilaterally, normal work of breathing GI: soft, nontender, nondistended, + BS MS: no deformity or atrophy  Skin: warm and dry, no rash Neuro:  Strength and sensation are intact Psych: euthymic mood, full affect   Recent Labs: 11/13/2018: ALT 14; Hemoglobin 12.3; Platelets 308 02/21/2019: BUN 13; Creatinine, Ser 1.20; Potassium 5.0; Sodium 138    Lipid Panel Lab Results  Component Value Date   CHOL 162 11/13/2018   HDL 43 11/13/2018   LDLCALC 95 11/13/2018   TRIG 137 11/13/2018      Wt Readings from Last 3 Encounters:  03/04/19 178 lb 4 oz (80.9 kg)  02/26/19 176 lb 5.9 oz (80 kg)  01/21/19 176 lb 9.6 oz (80.1 kg)      ASSESSMENT AND PLAN:  Tobacco abuse Long discussion concerning smoking cessation,  10 minutes spent discussing options for smoking cessation We will call in Chantix again for her at her request  Chest pain with moderate risk for cardiac etiology - Plan: EKG 12-Lead No recent episodes, no further work-up at this time Stressed importance  of smoking cessation, aggressive lipid management LDL less than 70  Centrilobular emphysema ([place]) Long discussion concerning smoking cessation  Type 2 diabetes mellitus without complication, without long-term current use of insulin ([place]) Stressed importance of low carbohydrate diet, walking program  Coronary artery disease Significant coronary calcification seen on CT scan 2018 Long history of smoking Smoking cessation discussed, we have refilled her Crestor 10 mg daily Goal LDL less than 70, may need higher dose Crestor  Disposition:   F/U  12 months   Total encounter time more than 25 minutes  Greater than 50% was spent in counseling and coordination of care with the patient  No orders of the defined types were placed in this encounter.    Signed, Esmond Plants, M.D., Ph.D. 03/04/2019  [place], [place]

## 2019-03-04 ENCOUNTER — Ambulatory Visit (INDEPENDENT_AMBULATORY_CARE_PROVIDER_SITE_OTHER): Payer: Self-pay | Admitting: Cardiovascular Disease

## 2019-03-04 ENCOUNTER — Other Ambulatory Visit: Payer: Self-pay

## 2019-03-04 ENCOUNTER — Encounter: Payer: Self-pay | Admitting: Cardiovascular Disease

## 2019-03-04 VITALS — BP 100/64 | HR 90 | Ht 66.0 in | Wt 178.2 lb

## 2019-03-04 DIAGNOSIS — I25118 Atherosclerotic heart disease of native coronary artery with other forms of angina pectoris: Secondary | ICD-10-CM

## 2019-03-04 DIAGNOSIS — E118 Type 2 diabetes mellitus with unspecified complications: Secondary | ICD-10-CM

## 2019-03-04 DIAGNOSIS — F17200 Nicotine dependence, unspecified, uncomplicated: Secondary | ICD-10-CM

## 2019-03-04 DIAGNOSIS — E782 Mixed hyperlipidemia: Secondary | ICD-10-CM

## 2019-03-04 MED ORDER — VARENICLINE TARTRATE 1 MG PO TABS: 1.0000 mg | ORAL_TABLET | Freq: Two times a day (BID) | ORAL | 3 refills | Status: DC

## 2019-03-04 MED ORDER — ROSUVASTATIN CALCIUM 10 MG PO TABS: 10.0000 mg | ORAL_TABLET | Freq: Every day | ORAL | 4 refills | Status: DC

## 2019-03-04 NOTE — Patient Instructions (Addendum)
Medication Instructions:  Restart chantix Restart crestor, goal LDL <70  If you need a refill on your cardiac medications before your next appointment, please call your pharmacy.    Lab work: No new labs needed   If you have labs (blood work) drawn today and your tests are completely normal, you will receive your results only by: Marland Kitchen MyChart Message (if you have MyChart) OR . A paper copy in the mail If you have any lab test that is abnormal or we need to change your treatment, we will call you to review the results.   Testing/Procedures: No new testing needed   Follow-Up: At [facility], you and your health needs are our priority.  As part of our continuing mission to provide you with exceptional heart care, we have created designated Provider Care Teams.  These Care Teams include your primary Cardiologist (physician) and Advanced Practice Providers (APPs -  Physician Assistants and Nurse Practitioners) who all work together to provide you with the care you need, when you need it.  . You will need a follow up appointment in 12 months   . Providers on your designated Care Team:   . Murray Hodgkins, NP . Christell Faith, PA-C . Marrianne Mood, PA-C  Any Other Special Instructions Will Be Listed Below (If Applicable).  For educational health videos Log in to : [email or web address] Or : [email or web address], password : [email or web address]

## 2019-03-05 ENCOUNTER — Ambulatory Visit (INDEPENDENT_AMBULATORY_CARE_PROVIDER_SITE_OTHER): Payer: Self-pay | Admitting: Physician Assistant

## 2019-03-05 ENCOUNTER — Telehealth: Payer: Self-pay | Admitting: Pharmacist

## 2019-03-05 ENCOUNTER — Encounter: Payer: Self-pay | Admitting: Orthopaedic Surgery

## 2019-03-05 DIAGNOSIS — Z9889 Other specified postprocedural states: Secondary | ICD-10-CM

## 2019-03-05 NOTE — Progress Notes (Signed)
Post-Op Visit Note   Patient: Erica Gomez           Date of Birth: 1965/07/20           MRN: [id number] Visit Date: 03/05/2019 PCP: Langston Reusing, NP   Assessment & Plan:  Chief Complaint: No chief complaint on file.  Visit Diagnoses:  1. S/P carpal tunnel release     Plan: Patient is a pleasant 54 year old female who presents to our clinic today 1 week out left carpal tunnel release 02/26/2019.  She has been doing excellent.  No pain.  No fevers or chills.  No numbness, tingling or burning.  Examination of her left wrist reveals a well-healing surgical incision with nylon sutures in place.  No evidence of infection or cellulitis.  Today, the incision was covered with a Band-Aid.  She was placed in a removable splint.  No heavy lifting or submerging her hand in water until she is 4 weeks postop.  Follow-up with [place] in 1 week's time for suture removal.  Call with concerns or questions the meantime.  Follow-Up Instructions: Return in about 1 week (around 03/12/2019) for suture removal.   Orders:  No orders of the defined types were placed in this encounter.  No orders of the defined types were placed in this encounter.   Imaging: No new imaging  PMFS History: Patient Active Problem List   Diagnosis Date Noted  . Carpal tunnel syndrome on left   . Carpal tunnel syndrome on right 01/14/2019  . De Quervain's tenosynovitis, left 01/14/2019  . Bilateral carpal tunnel syndrome 12/31/2018  . Pain in both hands 12/24/2018  . Neck pain on right side 12/24/2018  . Prediabetes 11/21/2018  . Bilateral hand pain 11/07/2018  . Health care maintenance 11/07/2018  . Neck pain 11/07/2018  . Anxiety 03/12/2018  . COPD with acute exacerbation ([place]) 03/12/2018  . HLD (hyperlipidemia) 03/12/2018  . CAD (coronary artery disease), native coronary artery 02/23/2017  . Chest pain 02/22/2017  . Chest pain with moderate risk for cardiac etiology 02/22/2017  . Tobacco abuse 10/04/2016    . Lung nodule, multiple 09/26/2016  . Diabetes ([place]) 04/03/2014  . COPD (chronic obstructive pulmonary disease) ([place]) 12/17/2012  . Seizure ([place]) 12/17/2012   Past Medical History:  Diagnosis Date  . Allergy   . Anxiety   . Asthma   . Bilateral carpal tunnel syndrome 12/31/2018  . Bipolar affective, manic ([place]) 1996  . COPD (chronic obstructive pulmonary disease) ([place])   . Depression   . HLD (hyperlipidemia) 03/12/2018  . Lung nodule, multiple 09/26/2016  . Manic depressive illness ([place]) 1996  . Seizures ([place])   . Substance abuse ([place])   . Tobacco abuse 10/04/2016    Family History  Problem Relation Age of Onset  . Stroke Mother   . Heart attack Father     Past Surgical History:  Procedure Laterality Date  . ABDOMINAL HYSTERECTOMY    . CARPAL TUNNEL RELEASE Right 01/15/2019   Procedure: RIGHTCARPAL TUNNEL RELEASE, LEFT DEQUERVAINS INJECTION;  Surgeon: Leandrew Koyanagi, MD;  Location: [facility];  Service: Orthopedics;  Laterality: Right;  . CARPAL TUNNEL RELEASE Left 02/26/2019   Procedure: LEFT CARPAL TUNNEL RELEASE;  Surgeon: Leandrew Koyanagi, MD;  Location: [facility];  Service: Orthopedics;  Laterality: Left;  Bier block  . COLONOSCOPY WITH PROPOFOL N/A 11/28/2018   Procedure: COLONOSCOPY WITH PROPOFOL;  Surgeon: [name], MD;  Location: [facility] ENDOSCOPY;  Service: Gastroenterology;  Laterality:  N/A;  . TUBAL LIGATION     Social History   Occupational History  . Not on file  Tobacco Use  . Smoking status: Current Every Day Smoker    Packs/day: 1.00    Years: 36.00    Pack years: 36.00    Types: Cigarettes  . Smokeless tobacco: Former Systems developer    Types: Chew, Snuff  . Tobacco comment: Using chew to wean herself off cigarettes  Substance and Sexual Activity  . Alcohol use: No    Comment: "Occasionally"  . Drug use: No    Types: "Crack" cocaine    Comment: history 2013  . Sexual activity: Yes    Birth control/protection: Condom, None

## 2019-03-05 NOTE — Telephone Encounter (Signed)
03/05/2019 11:00:17 AM - Chantix 1mg  pharmacy printout  03/05/2019 Received pharmacy printout for Chantix 1mg  Take one tablet by mouth 2 times a day for 11 weeks. I have printed [employer] application-Mailing patient her portion to sign & return with current income/support, also sending application to provider Dr. Rockey Situ to sign also a letter of Medical Necessity for patient to continue this medication, he will need to date, sign and fill in the reason to be sent to [place] with application.[name]

## 2019-03-12 ENCOUNTER — Other Ambulatory Visit: Payer: Self-pay

## 2019-03-12 ENCOUNTER — Ambulatory Visit (INDEPENDENT_AMBULATORY_CARE_PROVIDER_SITE_OTHER): Payer: Self-pay | Admitting: Physician Assistant

## 2019-03-12 ENCOUNTER — Encounter: Payer: Self-pay | Admitting: Orthopaedic Surgery

## 2019-03-12 DIAGNOSIS — Z9889 Other specified postprocedural states: Secondary | ICD-10-CM

## 2019-03-12 NOTE — Progress Notes (Signed)
Post-Op Visit Note   Patient: Erica Gomez           Date of Birth: 1966-01-29           MRN: [id number] Visit Date: 03/12/2019 PCP: Langston Reusing, NP   Assessment & Plan:  Chief Complaint: No chief complaint on file.  Visit Diagnoses:  1. Status post carpal tunnel release     Plan: Patient is a pleasant 54 year old female who comes in today 3 weeks status post left carpal 4 weeks 02/26/2019.  She has been doing well.  No numbness, tingling or burning.  Minimal to no pain.  Examination of the left wrist reveals a well-healing surgical incision with nylon sutures in place.  No evidence of infection or cellulitis.  Today, nylon sutures were removed and Steri-Strips applied.  She will avoid any heavy lifting or submerging her hand in water for 2 weeks.  Follow-up with [place] in 4 weeks time for repeat evaluation.  Call with concerns or questions in the meantime  Follow-Up Instructions: Return in about 4 weeks (around 04/09/2019).   Orders:  No orders of the defined types were placed in this encounter.  No orders of the defined types were placed in this encounter.   Imaging: No new imaging  PMFS History: Patient Active Problem List   Diagnosis Date Noted  . Carpal tunnel syndrome on left   . Carpal tunnel syndrome on right 01/14/2019  . De Quervain's tenosynovitis, left 01/14/2019  . Bilateral carpal tunnel syndrome 12/31/2018  . Pain in both hands 12/24/2018  . Neck pain on right side 12/24/2018  . Prediabetes 11/21/2018  . Bilateral hand pain 11/07/2018  . Health care maintenance 11/07/2018  . Neck pain 11/07/2018  . Anxiety 03/12/2018  . COPD with acute exacerbation ([place]) 03/12/2018  . HLD (hyperlipidemia) 03/12/2018  . CAD (coronary artery disease), native coronary artery 02/23/2017  . Chest pain 02/22/2017  . Chest pain with moderate risk for cardiac etiology 02/22/2017  . Tobacco abuse 10/04/2016  . Lung nodule, multiple 09/26/2016  . Diabetes ([place])  04/03/2014  . COPD (chronic obstructive pulmonary disease) ([place]) 12/17/2012  . Seizure ([place]) 12/17/2012   Past Medical History:  Diagnosis Date  . Allergy   . Anxiety   . Asthma   . Bilateral carpal tunnel syndrome 12/31/2018  . Bipolar affective, manic ([place]) 1996  . COPD (chronic obstructive pulmonary disease) ([place])   . Depression   . HLD (hyperlipidemia) 03/12/2018  . Lung nodule, multiple 09/26/2016  . Manic depressive illness ([place]) 1996  . Seizures ([place])   . Substance abuse ([place])   . Tobacco abuse 10/04/2016    Family History  Problem Relation Age of Onset  . Stroke Mother   . Heart attack Father     Past Surgical History:  Procedure Laterality Date  . ABDOMINAL HYSTERECTOMY    . CARPAL TUNNEL RELEASE Right 01/15/2019   Procedure: RIGHTCARPAL TUNNEL RELEASE, LEFT DEQUERVAINS INJECTION;  Surgeon: Leandrew Koyanagi, MD;  Location: [facility];  Service: Orthopedics;  Laterality: Right;  . CARPAL TUNNEL RELEASE Left 02/26/2019   Procedure: LEFT CARPAL TUNNEL RELEASE;  Surgeon: Leandrew Koyanagi, MD;  Location: [facility];  Service: Orthopedics;  Laterality: Left;  Bier block  . COLONOSCOPY WITH PROPOFOL N/A 11/28/2018   Procedure: COLONOSCOPY WITH PROPOFOL;  Surgeon: [name], MD;  Location[facility] ENDOSCOPY;  Service: Gastroenterology;  Laterality: N/A;  . TUBAL LIGATION     Social History   Occupational  History  . Not on file  Tobacco Use  . Smoking status: Current Every Day Smoker    Packs/day: 1.00    Years: 36.00    Pack years: 36.00    Types: Cigarettes  . Smokeless tobacco: Former Systems developer    Types: Chew, Snuff  . Tobacco comment: Using chew to wean herself off cigarettes  Substance and Sexual Activity  . Alcohol use: No    Comment: "Occasionally"  . Drug use: No    Types: "Crack" cocaine    Comment: history 2013  . Sexual activity: Yes    Birth control/protection: Condom, None

## 2019-04-02 ENCOUNTER — Other Ambulatory Visit: Payer: Self-pay

## 2019-04-02 DIAGNOSIS — R7303 Prediabetes: Secondary | ICD-10-CM

## 2019-04-03 LAB — BASIC METABOLIC PANEL
BUN/Creatinine Ratio: 10 (ref 9–23)
BUN: 11 mg/dL (ref 6–24)
CO2: 23 mmol/L (ref 20–29)
Calcium: 9.7 mg/dL (ref 8.7–10.2)
Chloride: 102 mmol/L (ref 96–106)
Creatinine, Ser: 1.05 mg/dL — ABNORMAL HIGH (ref 0.57–1.00)
GFR calc Af Amer: 70 mL/min/{1.73_m2} (ref >59)
GFR calc non Af Amer: 61 mL/min/{1.73_m2} (ref >59)
Glucose: 103 mg/dL — ABNORMAL HIGH (ref 65–99)
Potassium: 4.3 mmol/L (ref 3.5–5.2)
Sodium: 141 mmol/L (ref 134–144)

## 2019-04-03 LAB — HEMOGLOBIN A1C
Est. average glucose Bld gHb Est-mCnc: 140 mg/dL
Hgb A1c MFr Bld: 6.5 % — ABNORMAL HIGH (ref 4.8–5.6)

## 2019-04-08 ENCOUNTER — Ambulatory Visit: Payer: Self-pay | Admitting: Gerontology

## 2019-04-08 ENCOUNTER — Telehealth: Payer: Self-pay | Admitting: Pharmacist

## 2019-04-08 NOTE — Telephone Encounter (Signed)
04/08/2019 9:30:33 AM - Chantix pending  -- Erica Gomez - Tuesday, April 08, 2019 9:28 AM -- I have received the provider signed [employer] application back with letter of medical necessity, patient has not returned her portion, mailed to patient 03/05/2019.

## 2019-04-09 ENCOUNTER — Ambulatory Visit: Payer: Self-pay | Admitting: Gerontology

## 2019-04-09 ENCOUNTER — Other Ambulatory Visit: Payer: Self-pay

## 2019-04-09 ENCOUNTER — Ambulatory Visit (INDEPENDENT_AMBULATORY_CARE_PROVIDER_SITE_OTHER): Payer: Self-pay | Admitting: Physician Assistant

## 2019-04-09 ENCOUNTER — Encounter: Payer: Self-pay | Admitting: Orthopaedic Surgery

## 2019-04-09 ENCOUNTER — Encounter: Payer: Self-pay | Admitting: Gerontology

## 2019-04-09 VITALS — Ht 66.0 in | Wt 181.0 lb

## 2019-04-09 VITALS — BP 109/74 | HR 102 | Temp 96.6°F | Ht 66.0 in | Wt 181.2 lb

## 2019-04-09 DIAGNOSIS — Z9889 Other specified postprocedural states: Secondary | ICD-10-CM

## 2019-04-09 DIAGNOSIS — J439 Emphysema, unspecified: Secondary | ICD-10-CM

## 2019-04-09 DIAGNOSIS — R7303 Prediabetes: Secondary | ICD-10-CM

## 2019-04-09 DIAGNOSIS — R000 Tachycardia, unspecified: Secondary | ICD-10-CM | POA: Insufficient documentation

## 2019-04-09 DIAGNOSIS — E119 Type 2 diabetes mellitus without complications: Secondary | ICD-10-CM

## 2019-04-09 DIAGNOSIS — Z720 Tobacco use: Secondary | ICD-10-CM

## 2019-04-09 DIAGNOSIS — Z0000 Encounter for general adult medical examination without abnormal findings: Secondary | ICD-10-CM

## 2019-04-09 MED ORDER — METFORMIN HCL 1000 MG PO TABS: 500.0000 mg | ORAL_TABLET | Freq: Two times a day (BID) | ORAL | 3 refills | Status: DC

## 2019-04-09 MED ORDER — IPRATROPIUM-ALBUTEROL 0.5-2.5 (3) MG/3ML IN SOLN: 3.0000 mL | Freq: Four times a day (QID) | RESPIRATORY_TRACT | 2 refills | Status: DC

## 2019-04-09 MED ORDER — BLOOD GLUCOSE MONITOR KIT: PACK | 0 refills | Status: DC

## 2019-04-09 MED ORDER — FLUTICASONE-SALMETEROL 500-50 MCG/DOSE IN AEPB: 1.0000 | INHALATION_SPRAY | Freq: Two times a day (BID) | RESPIRATORY_TRACT | 1 refills | Status: DC

## 2019-04-09 NOTE — Progress Notes (Signed)
Established Patient Office Visit  Subjective:  Patient ID: Erica Gomez, female    DOB: [birth year]  Age: 54 y.o. MRN: [id number]  CC:  Chief Complaint  Patient presents with  . Follow-up    labs    HPI Erica Gomez presents for follow up of type 2 diabetes, lab review and medication refill. She states that she's compliant with her medication, and she continues to make healthy life style changes. She had Left Carpel tunnel release procedure performed by Dr Frankey Shown on 02/26/2019. She reports that her symptoms has improved greatly and she experiences minor discomfort with certain movements. She reports that she will follow up with Orthopedic today. Her Serum creatinine done on 04/02/2019 decreased from to 1.20 mg/dl to 1.05 mg/dl. Her HgbA1c done on 04/02/2019 was 6.5%. She states that she smokes 4 cigarettes or less daily and continues to take 1 mg of Chantix bid for smoking cessation. Her heart rate was rechecked and it was 102 b p m. She denies chest pain, light headedness and shortness of breath. Overall, she states that she's doing well and offers no further complaint.  Past Medical History:  Diagnosis Date  . Allergy   . Anxiety   . Asthma   . Bilateral carpal tunnel syndrome 12/31/2018  . Bipolar affective, manic ([place]) 1996  . COPD (chronic obstructive pulmonary disease) ([place])   . Depression   . HLD (hyperlipidemia) 03/12/2018  . Lung nodule, multiple 09/26/2016  . Manic depressive illness ([place]) 1996  . Seizures ([place])   . Substance abuse ([place])   . Tobacco abuse 10/04/2016    Past Surgical History:  Procedure Laterality Date  . ABDOMINAL HYSTERECTOMY    . CARPAL TUNNEL RELEASE Right 01/15/2019   Procedure: RIGHTCARPAL TUNNEL RELEASE, LEFT DEQUERVAINS INJECTION;  Surgeon: Leandrew Koyanagi, MD;  Location: [facility];  Service: Orthopedics;  Laterality: Right;  . CARPAL TUNNEL RELEASE Left 02/26/2019   Procedure: LEFT CARPAL TUNNEL RELEASE;  Surgeon: Leandrew Koyanagi, MD;  Location: [facility];  Service: Orthopedics;  Laterality: Left;  Bier block  . COLONOSCOPY WITH PROPOFOL N/A 11/28/2018   Procedure: COLONOSCOPY WITH PROPOFOL;  Surgeon: [name], MD;  Location: [facility] ENDOSCOPY;  Service: Gastroenterology;  Laterality: N/A;  . TUBAL LIGATION      Family History  Problem Relation Age of Onset  . Stroke Mother   . Heart attack Father     Social History   Socioeconomic History  . Marital status: Married    Spouse name: Not on file  . Number of children: Not on file  . Years of education: Not on file  . Highest education level: Not on file  Occupational History  . Not on file  Tobacco Use  . Smoking status: Current Every Day Smoker    Packs/day: 1.00    Years: 36.00    Pack years: 36.00    Types: Cigarettes  . Smokeless tobacco: Former Systems developer    Types: Chew, Snuff  . Tobacco comment: Using chew to wean herself off cigarettes  Substance and Sexual Activity  . Alcohol use: No    Comment: "Occasionally"  . Drug use: No    Types: "Crack" cocaine    Comment: history 2013  . Sexual activity: Yes    Birth control/protection: Condom, None  Other Topics Concern  . Not on file  Social History Narrative  . Not on file   Social Determinants of Health   Financial Resource Strain:   .  Difficulty of Paying Living Expenses: Not on file  Food Insecurity:   . Worried About Charity fundraiser in the Last Year: Not on file  . Ran Out of Food in the Last Year: Not on file  Transportation Needs:   . Lack of Transportation (Medical): Not on file  . Lack of Transportation (Non-Medical): Not on file  Physical Activity:   . Days of Exercise per Week: Not on file  . Minutes of Exercise per Session: Not on file  Stress:   . Feeling of Stress : Not on file  Social Connections:   . Frequency of Communication with Friends and Family: Not on file  . Frequency of Social Gatherings with Friends and Family: Not on file  . Attends  Religious Services: Not on file  . Active Member of Clubs or Organizations: Not on file  . Attends Archivist Meetings: Not on file  . Marital Status: Not on file  Intimate Partner Violence:   . Fear of Current or Ex-Partner: Not on file  . Emotionally Abused: Not on file  . Physically Abused: Not on file  . Sexually Abused: Not on file    Outpatient Medications Prior to Visit  Medication Sig Dispense Refill  . albuterol (VENTOLIN HFA) 108 (90 Base) MCG/ACT inhaler INHALE 2 PUFFS EVERY 4 TO 6 HOURS AS NEEDED 54 g 0  . aspirin EC 81 MG tablet Take 1 tablet (81 mg total) by mouth daily. No ibuprofen for at least one hour    . citalopram (CELEXA) 20 MG tablet Take 20 mg by mouth daily.    . Fluticasone-Salmeterol (ADVAIR DISKUS) 500-50 MCG/DOSE AEPB Inhale 1 puff into the lungs 2 (two) times daily. 3 each 1  . gabapentin (NEURONTIN) 100 MG capsule Take 1 capsule (100 mg total) by mouth 3 (three) times daily. 30 capsule 2  . ipratropium-albuterol (DUONEB) 0.5-2.5 (3) MG/3ML SOLN Take 3 mLs by nebulization every 6 (six) hours. 360 mL 2  . metFORMIN (GLUCOPHAGE) 1000 MG tablet Take 0.5 tablets (500 mg total) by mouth daily with breakfast. 30 tablet 3  . risperiDONE (RISPERDAL) 2 MG tablet Take 2 mg by mouth daily.    . rosuvastatin (CRESTOR) 10 MG tablet Take 1 tablet (10 mg total) by mouth daily. 90 tablet 4  . tiotropium (SPIRIVA HANDIHALER) 18 MCG inhalation capsule Place 1 capsule (18 mcg total) into inhaler and inhale daily. 30 capsule 11  . varenicline (CHANTIX CONTINUING MONTH PAK) 1 MG tablet Take 1 tablet (1 mg total) by mouth 2 (two) times daily. 60 tablet 3  . HYDROcodone-acetaminophen (NORCO) 5-325 MG tablet Take 1-2 tablets by mouth 3 (three) times daily as needed. (Patient not taking: Reported on 04/09/2019) 20 tablet 0  . ondansetron (ZOFRAN) 4 MG tablet Take 1-2 tablets (4-8 mg total) by mouth every 8 (eight) hours as needed for nausea or vomiting. (Patient not taking:  Reported on 04/09/2019) 20 tablet 0   No facility-administered medications prior to visit.    Allergies  Allergen Reactions  . Flax Seed Oil [Bio-Flax] Hives    Pt. Self reported    ROS Review of Systems  Constitutional: Negative.   Respiratory: Negative.   Cardiovascular: Negative.   Endocrine: Negative.   Skin: Negative.   Neurological: Negative.   Psychiatric/Behavioral: Negative.       Objective:    Physical Exam  Constitutional: She is oriented to person, place, and time. She appears well-developed.  HENT:  Head: Normocephalic and atraumatic.  Eyes:  Pupils are equal, round, and reactive to light. EOM are normal.  Cardiovascular: Regular rhythm. Tachycardia present.  Pulmonary/Chest: Effort normal and breath sounds normal.  Musculoskeletal:        General: Normal range of motion.  Neurological: She is alert and oriented to person, place, and time.  Skin: Skin is warm and dry.  Psychiatric: She has a normal mood and affect. Her behavior is normal. Judgment and thought content normal.    BP 105/73 (BP Location: Left Arm, Patient Position: Sitting, Cuff Size: Large)   Pulse (!) 105   Temp (!) 96.6 F (35.9 C)   Ht _0  (1.676 m)   Wt 181 lb 3.2 oz (82.2 kg)   SpO2 94%   BMI 29.25 kg/m  Wt Readings from Last 3 Encounters:  04/09/19 181 lb 3.2 oz (82.2 kg)  03/04/19 178 lb 4 oz (80.9 kg)  02/26/19 176 lb 5.9 oz (80 kg)     Health Maintenance Due  Topic Date Due  . OPHTHALMOLOGY EXAM  09/29/1975  . TETANUS/TDAP  09/28/1984  . PAP SMEAR-Modifier  09/29/1986  . MAMMOGRAM  09/29/2015    There are no preventive care reminders to display for this patient.  Lab Results  Component Value Date   TSH 1.030 05/11/2016   Lab Results  Component Value Date   WBC 8.4 11/13/2018   HGB 12.3 11/13/2018   HCT 38.3 11/13/2018   MCV 91 11/13/2018   PLT 308 11/13/2018   Lab Results  Component Value Date   NA 141 04/02/2019   K 4.3 04/02/2019   CO2 23  04/02/2019   GLUCOSE 103 (H) 04/02/2019   BUN 11 04/02/2019   CREATININE 1.05 (H) 04/02/2019   BILITOT <0.2 11/13/2018   ALKPHOS 72 11/13/2018   AST 12 11/13/2018   ALT 14 11/13/2018   PROT 6.5 11/13/2018   ALBUMIN 4.3 11/13/2018   CALCIUM 9.7 04/02/2019   ANIONGAP 5 02/21/2019   Lab Results  Component Value Date   CHOL 162 11/13/2018   Lab Results  Component Value Date   HDL 43 11/13/2018   Lab Results  Component Value Date   LDLCALC 95 11/13/2018   Lab Results  Component Value Date   TRIG 137 11/13/2018   Lab Results  Component Value Date   CHOLHDL 3.8 11/13/2018   Lab Results  Component Value Date   HGBA1C 6.5 (H) 04/02/2019      Assessment & Plan:   1. Pulmonary emphysema, unspecified emphysema type ([place]) - Her COPD is stable and she will continue on current medication - Fluticasone-Salmeterol (ADVAIR DISKUS) 500-50 MCG/DOSE AEPB; Inhale 1 puff into the lungs 2 (two) times daily.  Dispense: 3 each; Refill: 1 - ipratropium-albuterol (DUONEB) 0.5-2.5 (3) MG/3ML SOLN; Take 3 mLs by nebulization every 6 (six) hours.  Dispense: 360 mL; Refill: 2  2. Type 2 diabetes mellitus without complication, without long-term current use of insulin (H[facility]) -Her HgbA1c was 6.5% and her metformin was increased to 500 mg bid. She was provided with glucometer, advised to check her fasting blood glucose daily and her goal should be between 80-130 mg/dl. She was advised to continue on low carb/non concentrated sweet diet, and exercise as tolerated. - HgB A1c; Future - metFORMIN (GLUCOPHAGE) 1000 MG tablet; Take 0.5 tablets (500 mg total) by mouth 2 (two) times daily with a meal.  Dispense: 60 tablet; Refill: 3 - blood glucose meter kit and supplies KIT; Dispense based on patient and insurance preference. Use up to four  times daily as directed. (FOR ICD-9 250.00, 250.01).  Dispense: 1 each; Refill: 0  3. Tobacco abuse - She was advised to continue on her smoking cessation regimen  4.  Health care maintenance - Routine labs will be rechecked. - Basic Metabolic Panel (BMET); Future - Lipid panel; Future - Ambulatory referral to Hematology / Oncology for Pap smear.  5. Increased heart rate - Her heart rate was 102 bpm, she was advised to check her heart rate, record and bring log to follow up appointment. She was advised to go to the ED with worsening symptoms and also to increase water intake.     Follow-up: No follow-ups on file.    Genasis Zingale Jerold Coombe, NP

## 2019-04-09 NOTE — Progress Notes (Signed)
Post-Op Visit Note   Patient: Erica Gomez           Date of Birth: 02/25/1965           MRN: [id number] Visit Date: 04/09/2019 PCP: Langston Reusing, NP   Assessment & Plan:  Chief Complaint:  Chief Complaint  Patient presents with  . Left Hand - Follow-up    Left carpal tunnel release DOS 02/26/2019   Visit Diagnoses:  1. S/P carpal tunnel release     Plan: Patient is a pleasant 54 year old who comes in today 6 weeks status post left carpal tunnel release 02/26/2019.  She is doing well.  No pain.  No numbness, tingling or burning.  Examination of her left wrist reveals a fully healed surgical scar without complication.  Full sensation distally.  At this point, she will advance with activity as tolerated.  We have written her to return to work without restrictions starting tomorrow.  Follow-up as needed.  Call with concerns or questions.  Follow-Up Instructions: Return if symptoms worsen or fail to improve.   Orders:  No orders of the defined types were placed in this encounter.  No orders of the defined types were placed in this encounter.   Imaging: No new imaging  PMFS History: Patient Active Problem List   Diagnosis Date Noted  . Increased heart rate 04/09/2019  . Carpal tunnel syndrome on left   . Carpal tunnel syndrome on right 01/14/2019  . De Quervain's tenosynovitis, left 01/14/2019  . Bilateral carpal tunnel syndrome 12/31/2018  . Pain in both hands 12/24/2018  . Neck pain on right side 12/24/2018  . Prediabetes 11/21/2018  . Bilateral hand pain 11/07/2018  . Health care maintenance 11/07/2018  . Neck pain 11/07/2018  . Anxiety 03/12/2018  . COPD with acute exacerbation ([place]) 03/12/2018  . HLD (hyperlipidemia) 03/12/2018  . CAD (coronary artery disease), native coronary artery 02/23/2017  . Chest pain 02/22/2017  . Chest pain with moderate risk for cardiac etiology 02/22/2017  . Tobacco abuse 10/04/2016  . Lung nodule, multiple 09/26/2016  .  Diabetes ([place]) 04/03/2014  . COPD (chronic obstructive pulmonary disease) ([place]) 12/17/2012  . Seizure ([place]) 12/17/2012   Past Medical History:  Diagnosis Date  . Allergy   . Anxiety   . Asthma   . Bilateral carpal tunnel syndrome 12/31/2018  . Bipolar affective, manic ([place]) 1996  . COPD (chronic obstructive pulmonary disease) ([place])   . Depression   . HLD (hyperlipidemia) 03/12/2018  . Lung nodule, multiple 09/26/2016  . Manic depressive illness ([place]) 1996  . Seizures ([place])   . Substance abuse ([place])   . Tobacco abuse 10/04/2016    Family History  Problem Relation Age of Onset  . Stroke Mother   . Heart attack Father     Past Surgical History:  Procedure Laterality Date  . ABDOMINAL HYSTERECTOMY    . CARPAL TUNNEL RELEASE Right 01/15/2019   Procedure: RIGHTCARPAL TUNNEL RELEASE, LEFT DEQUERVAINS INJECTION;  Surgeon: Leandrew Koyanagi, MD;  Location: [facility];  Service: Orthopedics;  Laterality: Right;  . CARPAL TUNNEL RELEASE Left 02/26/2019   Procedure: LEFT CARPAL TUNNEL RELEASE;  Surgeon: Leandrew Koyanagi, MD;  Location: [facility];  Service: Orthopedics;  Laterality: Left;  Bier block  . COLONOSCOPY WITH PROPOFOL N/A 11/28/2018   Procedure: COLONOSCOPY WITH PROPOFOL;  Surgeon: [name], MD;  Location: [facility] ENDOSCOPY;  Service: Gastroenterology;  Laterality: N/A;  . TUBAL LIGATION     Social  History   Occupational History  . Not on file  Tobacco Use  . Smoking status: Current Every Day Smoker    Packs/day: 1.00    Years: 36.00    Pack years: 36.00    Types: Cigarettes  . Smokeless tobacco: Former Systems developer    Types: Chew, Snuff  . Tobacco comment: Using chew to wean herself off cigarettes  Substance and Sexual Activity  . Alcohol use: No    Comment: "Occasionally"  . Drug use: No    Types: "Crack" cocaine    Comment: history 2013  . Sexual activity: Yes    Birth control/protection: Condom, None

## 2019-04-09 NOTE — Patient Instructions (Signed)
Carbohydrate Counting for Diabetes Mellitus, Adult  Carbohydrate counting is a method of keeping track of how many carbohydrates you eat. Eating carbohydrates naturally increases the amount of sugar (glucose) in the blood. Counting how many carbohydrates you eat helps keep your blood glucose within normal limits, which helps you manage your diabetes (diabetes mellitus). It is important to know how many carbohydrates you can safely have in each meal. This is different for every person. A diet and nutrition specialist (registered dietitian) can help you make a meal plan and calculate how many carbohydrates you should have at each meal and snack. Carbohydrates are found in the following foods:  Grains, such as breads and cereals.  Dried beans and soy products.  Starchy vegetables, such as potatoes, peas, and corn.  Fruit and fruit juices.  Milk and yogurt.  Sweets and snack foods, such as cake, cookies, candy, chips, and soft drinks. How do I count carbohydrates? There are two ways to count carbohydrates in food. You can use either of the methods or a combination of both. Reading "Nutrition Facts" on packaged food The "Nutrition Facts" list is included on the labels of almost all packaged foods and beverages in the [place] It includes:  The serving size.  Information about nutrients in each serving, including the grams (g) of carbohydrate per serving. To use the "Nutrition Facts":  Decide how many servings you will have.  Multiply the number of servings by the number of carbohydrates per serving.  The resulting number is the total amount of carbohydrates that you will be having. Learning standard serving sizes of other foods When you eat carbohydrate foods that are not packaged or do not include "Nutrition Facts" on the label, you need to measure the servings in order to count the amount of carbohydrates:  Measure the foods that you will eat with a food scale or measuring cup, if  needed.  Decide how many standard-size servings you will eat.  Multiply the number of servings by 15. Most carbohydrate-rich foods have about 15 g of carbohydrates per serving. ? For example, if you eat 8 oz (170 g) of strawberries, you will have eaten 2 servings and 30 g of carbohydrates (2 servings x 15 g = 30 g).  For foods that have more than one food mixed, such as soups and casseroles, you must count the carbohydrates in each food that is included. The following list contains standard serving sizes of common carbohydrate-rich foods. Each of these servings has about 15 g of carbohydrates:   hamburger bun or  English muffin.   oz (15 mL) syrup.   oz (14 g) jelly.  1 slice of bread.  1 six-inch tortilla.  3 oz (85 g) cooked rice or pasta.  4 oz (113 g) cooked dried beans.  4 oz (113 g) starchy vegetable, such as peas, corn, or potatoes.  4 oz (113 g) hot cereal.  4 oz (113 g) mashed potatoes or  of a large baked potato.  4 oz (113 g) canned or frozen fruit.  4 oz (120 mL) fruit juice.  4-6 crackers.  6 chicken nuggets.  6 oz (170 g) unsweetened dry cereal.  6 oz (170 g) plain fat-free yogurt or yogurt sweetened with artificial sweeteners.  8 oz (240 mL) milk.  8 oz (170 g) fresh fruit or one small piece of fruit.  24 oz (680 g) popped popcorn. Example of carbohydrate counting Sample meal  3 oz (85 g) chicken breast.  6 oz (170 g)   brown rice.  4 oz (113 g) corn.  8 oz (240 mL) milk.  8 oz (170 g) strawberries with sugar-free whipped topping. Carbohydrate calculation 1. Identify the foods that contain carbohydrates: ? Rice. ? Corn. ? Milk. ? Strawberries. 2. Calculate how many servings you have of each food: ? 2 servings rice. ? 1 serving corn. ? 1 serving milk. ? 1 serving strawberries. 3. Multiply each number of servings by 15 g: ? 2 servings rice x 15 g = 30 g. ? 1 serving corn x 15 g = 15 g. ? 1 serving milk x 15 g = 15 g. ? 1  serving strawberries x 15 g = 15 g. 4. Add together all of the amounts to find the total grams of carbohydrates eaten: ? 30 g + 15 g + 15 g + 15 g = 75 g of carbohydrates total. Summary  Carbohydrate counting is a method of keeping track of how many carbohydrates you eat.  Eating carbohydrates naturally increases the amount of sugar (glucose) in the blood.  Counting how many carbohydrates you eat helps keep your blood glucose within normal limits, which helps you manage your diabetes.  A diet and nutrition specialist (registered dietitian) can help you make a meal plan and calculate how many carbohydrates you should have at each meal and snack. This information is not intended to replace advice given to you by your health care provider. Make sure you discuss any questions you have with your health care provider. Document Revised: 08/24/2016 Document Reviewed: 07/14/2015 Elsevier Patient Education  2020 Elsevier Inc.  

## 2019-04-16 NOTE — Progress Notes (Signed)
I have reviewed and agreed above plan. 

## 2019-04-18 ENCOUNTER — Telehealth: Payer: Self-pay | Admitting: Pharmacist

## 2019-04-18 NOTE — Telephone Encounter (Signed)
04/18/2019 3:36:35 PM - Advair & Ventolin refill online with [facility] - Friday, April 18, 2019 3:35 PM -- Refilled online with [facility] for Advair 500/50 & Ventolin--to ship 05/01/19, order# [id number].

## 2019-06-13 ENCOUNTER — Telehealth: Payer: Self-pay | Admitting: Pharmacy Technician

## 2019-06-13 ENCOUNTER — Telehealth: Payer: Self-pay | Admitting: Orthopaedic Surgery

## 2019-06-13 NOTE — Telephone Encounter (Signed)
May need to take a week off work and rest it.

## 2019-06-13 NOTE — Telephone Encounter (Signed)
Patient's husband Jenny Reichmann) called this morning stating that the patient's hands have been hurting her due to the type of work that she does.  He wanted to know what Dr. Erlinda Hong recommended for her to help with the pain.  His CB#[phone].  Thank you.

## 2019-06-13 NOTE — Telephone Encounter (Signed)
Patient told [facility] that she has insurance with [employer].  No longer meets [facility]'s eligibility criteria.  Patient notified.  [address], [place]  [id number]     This is to inform you that you are no longer eligible to receive medication assistance at [facility].  The reason(s) are:    _____Your total gross monthly household income exceeds 250% of the Federal Poverty Level.   _____Tangible assets (savings, checking, stocks/bonds, pension, retirement, etc.) exceeds our limit  _____You are eligible to receive benefits from [facility], [facility] or HIV Medication            Assistance program _____You are eligible to receive benefits from a Medicare Part "D" plan __X__You have prescription insurance  _____You are not an [facility] resident _____Failure to provide all requested proof of income information for 2021.    We regret that we are unable to help you at this time.  If your prescription coverage is terminated, please contact [facility], so that we may reassess your eligibility for our program.  If you have questions, we may be contacted at [phone].  Thank you,  [facility]

## 2019-06-13 NOTE — Telephone Encounter (Signed)
I spoke with patient's husband. He states that pain in her hands has worsened since going back to work. She has tried to wear her wrist splints with no relief and states they actually get in her way. I advised of Dr. Phoebe Sharps message. Work note entered for patient to be out of work until 06/23/2019.  Patient's husband will come in to pick up note. Put at front desk for pick up.

## 2019-06-24 ENCOUNTER — Telehealth: Payer: Self-pay | Admitting: Orthopaedic Surgery

## 2019-06-24 NOTE — Telephone Encounter (Signed)
yes

## 2019-06-24 NOTE — Telephone Encounter (Signed)
Patient's husband called  The patient returned to work but her hand gave her an extremely hard time. They are requesting a call back to discuss furthering her work restrictions.   Call back: [phone]

## 2019-06-24 NOTE — Telephone Encounter (Signed)
pts husband Jenny Reichmann called again to see if he could at least pick up a work note excusing the pt tonight 06/24/19 until they can figure out a care plan for the pt. John would like a call back when this is ready for pickup.  [phone]

## 2019-06-26 NOTE — Telephone Encounter (Signed)
Patient states she has letter

## 2019-07-02 ENCOUNTER — Other Ambulatory Visit: Payer: Self-pay

## 2019-07-02 DIAGNOSIS — Z0000 Encounter for general adult medical examination without abnormal findings: Secondary | ICD-10-CM

## 2019-07-02 DIAGNOSIS — E119 Type 2 diabetes mellitus without complications: Secondary | ICD-10-CM

## 2019-07-03 LAB — BASIC METABOLIC PANEL
BUN/Creatinine Ratio: 16 (ref 9–23)
BUN: 17 mg/dL (ref 6–24)
CO2: 24 mmol/L (ref 20–29)
Calcium: 10 mg/dL (ref 8.7–10.2)
Chloride: 101 mmol/L (ref 96–106)
Creatinine, Ser: 1.09 mg/dL — ABNORMAL HIGH (ref 0.57–1.00)
GFR calc Af Amer: 67 mL/min/{1.73_m2} (ref >59)
GFR calc non Af Amer: 58 mL/min/{1.73_m2} — ABNORMAL LOW (ref >59)
Glucose: 92 mg/dL (ref 65–99)
Potassium: 4.5 mmol/L (ref 3.5–5.2)
Sodium: 137 mmol/L (ref 134–144)

## 2019-07-03 LAB — LIPID PANEL
Chol/HDL Ratio: 2.3 ratio (ref 0.0–4.4)
Cholesterol, Total: 119 mg/dL (ref 100–199)
HDL: 52 mg/dL (ref >39)
LDL Chol Calc (NIH): 50 mg/dL (ref 0–99)
Triglycerides: 86 mg/dL (ref 0–149)
VLDL Cholesterol Cal: 17 mg/dL (ref 5–40)

## 2019-07-03 LAB — HEMOGLOBIN A1C
Est. average glucose Bld gHb Est-mCnc: 137 mg/dL
Hgb A1c MFr Bld: 6.4 % — ABNORMAL HIGH (ref 4.8–5.6)

## 2019-07-09 ENCOUNTER — Ambulatory Visit: Payer: Self-pay | Admitting: Gerontology

## 2019-07-22 ENCOUNTER — Ambulatory Visit: Payer: Self-pay | Admitting: Neurology

## 2019-07-22 ENCOUNTER — Encounter: Payer: Self-pay | Admitting: Neurology

## 2019-07-22 NOTE — Progress Notes (Deleted)
PATIENT: Erica Gomez DOB: July 22, 1965  REASON FOR VISIT: follow up HISTORY FROM: patient  HISTORY OF PRESENT ILLNESS: Today 07/22/19  HISTORY Erica Briggs Turneris a 54 year old female,accompanied by her husband,seen in request by pain management NPIloabachie, Chiomafor evaluation of bilateral hands pain. Initial evaluation was on Dec 24 2018.  I have reviewed and summarized the referring note from the referring physician.She has PMHx of bipolar disorder, has been current stable dose of celexa '20mg'$  daily and risperidone '2mg'$  qhs for many years.  She worked at [employer] over the past 5 years, which requires repetitive hand movement, wrist flexion, arm pronation, flexion. She had intermittent bilateral hand joints pain, numbness, tingling since 2018, gradually getting worse, sometimes her hand lock up for few minutes, difficulty using her hands, sometimes wake up from sleep with all five fingers paresthesia, she has to shake her hands to make the sensation come back.  On Sept 25th 2020, her bilateral hands has painful spasm, locked up, it was difficulty for her to drive, she has not been able to go back to work since. She was seen by orthopedics, reported normal x-ray of left wrist, she was treated with left wrist splint with mild improvement.  She also complains of chronic neck pain, radiating pain to both shoulders and arms.  Update January 21, 2019 SS: EMG nerve conduction evaluation in November 2020 was abnormal, evidence of bilateral median neuropathy across the wrist, consistent with bilateral carpal tunnel syndromes, right side is moderate to severe, left side is moderate, demyelinating in nature, no evidence of axonal loss  MRI of cervical spine showed multiple level degenerative changes, most severe at C5-6 with evidence of foraminal stenosis.  EMG nerve conduction showed no evidence of cervical radiculopathy  She had right carpal tunnel release with Dr. Erlinda Hong  01/15/2019, she had injection to the left wrist.  She sees Dr. Erlinda Hong tomorrow.   Occasional neck pain, will radiate from right side of neck, down to right arm. It may be at night, wakes her up from sleep. She hasn't had any of this since surgery, is much better.  Overall, is doing well since surgery, she thinks they may operate on the left side.  She hopes to be able to return to work. She takes gabapentin 100 mg TID.    Update July 22, 2019 SS:   REVIEW OF SYSTEMS: Out of a complete 14 system review of symptoms, the patient complains only of the following symptoms, and all other reviewed systems are negative.  ALLERGIES: Allergies  Allergen Reactions  . Flax Seed Oil [Bio-Flax] Hives    Pt. Self reported    HOME MEDICATIONS: Outpatient Medications Prior to Visit  Medication Sig Dispense Refill  . albuterol (VENTOLIN HFA) 108 (90 Base) MCG/ACT inhaler INHALE 2 PUFFS EVERY 4 TO 6 HOURS AS NEEDED 54 g 0  . aspirin EC 81 MG tablet Take 1 tablet (81 mg total) by mouth daily. No ibuprofen for at least one hour    . blood glucose meter kit and supplies KIT Dispense based on patient and insurance preference. Use up to four times daily as directed. (FOR ICD-9 250.00, 250.01). 1 each 0  . citalopram (CELEXA) 20 MG tablet Take 20 mg by mouth daily.    . Fluticasone-Salmeterol (ADVAIR DISKUS) 500-50 MCG/DOSE AEPB Inhale 1 puff into the lungs 2 (two) times daily. 3 each 1  . gabapentin (NEURONTIN) 100 MG capsule Take 1 capsule (100 mg total) by mouth 3 (three) times daily. [place]  capsule 2  . ipratropium-albuterol (DUONEB) 0.5-2.5 (3) MG/3ML SOLN Take 3 mLs by nebulization every 6 (six) hours. 360 mL 2  . metFORMIN (GLUCOPHAGE) 1000 MG tablet Take 0.5 tablets (500 mg total) by mouth 2 (two) times daily with a meal. 60 tablet 3  . risperiDONE (RISPERDAL) 2 MG tablet Take 2 mg by mouth daily.    . rosuvastatin (CRESTOR) 10 MG tablet Take 1 tablet (10 mg total) by mouth daily. 90 tablet 4  . tiotropium (SPIRIVA  HANDIHALER) 18 MCG inhalation capsule Place 1 capsule (18 mcg total) into inhaler and inhale daily. 30 capsule 11  . varenicline (CHANTIX CONTINUING MONTH PAK) 1 MG tablet Take 1 tablet (1 mg total) by mouth 2 (two) times daily. 60 tablet 3   No facility-administered medications prior to visit.    PAST MEDICAL HISTORY: Past Medical History:  Diagnosis Date  . Allergy   . Anxiety   . Asthma   . Bilateral carpal tunnel syndrome 12/31/2018  . Bipolar affective, manic ([place]) 1996  . COPD (chronic obstructive pulmonary disease) ([place])   . Depression   . HLD (hyperlipidemia) 03/12/2018  . Lung nodule, multiple 09/26/2016  . Manic depressive illness ([place]) 1996  . Seizures ([place])   . Substance abuse ([place])   . Tobacco abuse 10/04/2016    PAST SURGICAL HISTORY: Past Surgical History:  Procedure Laterality Date  . ABDOMINAL HYSTERECTOMY    . CARPAL TUNNEL RELEASE Right 01/15/2019   Procedure: RIGHTCARPAL TUNNEL RELEASE, LEFT DEQUERVAINS INJECTION;  Surgeon: Leandrew Koyanagi, MD;  Location: [place];  Service: Orthopedics;  Laterality: Right;  . CARPAL TUNNEL RELEASE Left 02/26/2019   Procedure: LEFT CARPAL TUNNEL RELEASE;  Surgeon: Leandrew Koyanagi, MD;  Location: [place];  Service: Orthopedics;  Laterality: Left;  Bier block  . COLONOSCOPY WITH PROPOFOL N/A 11/28/2018   Procedure: COLONOSCOPY WITH PROPOFOL;  Surgeon: [name], MD;  [facility] ENDOSCOPY;  Service: Gastroenterology;  Laterality: N/A;  . TUBAL LIGATION      FAMILY HISTORY: Family History  Problem Relation Age of Onset  . Stroke Mother   . Heart attack Father     SOCIAL HISTORY: Social History   Socioeconomic History  . Marital status: Married    Spouse name: Not on file  . Number of children: Not on file  . Years of education: Not on file  . Highest education level: Not on file  Occupational History  . Not on file  Tobacco Use  . Smoking status: Current Every Day Smoker     Packs/day: 1.00    Years: 36.00    Pack years: 36.00    Types: Cigarettes  . Smokeless tobacco: Former Systems developer    Types: Chew, Snuff  . Tobacco comment: Using chew to wean herself off cigarettes  Substance and Sexual Activity  . Alcohol use: No    Comment: "Occasionally"  . Drug use: No    Types: "Crack" cocaine    Comment: history 2013  . Sexual activity: Yes    Birth control/protection: Condom, None  Other Topics Concern  . Not on file  Social History Narrative  . Not on file   Social Determinants of Health   Financial Resource Strain:   . Difficulty of Paying Living Expenses:   Food Insecurity:   . Worried About Charity fundraiser in the Last Year:   . Arboriculturist in the Last Year:   Transportation Needs:   . Lack of Transportation (  Medical):   Marland Kitchen Lack of Transportation (Non-Medical):   Physical Activity:   . Days of Exercise per Week:   . Minutes of Exercise per Session:   Stress:   . Feeling of Stress :   Social Connections:   . Frequency of Communication with Friends and Family:   . Frequency of Social Gatherings with Friends and Family:   . Attends Religious Services:   . Active Member of Clubs or Organizations:   . Attends Archivist Meetings:   Marland Kitchen Marital Status:   Intimate Partner Violence:   . Fear of Current or Ex-Partner:   . Emotionally Abused:   Marland Kitchen Physically Abused:   . Sexually Abused:       PHYSICAL EXAM  There were no vitals filed for this visit. There is no height or weight on file to calculate BMI.  Generalized: Well developed, in no acute distress   Neurological examination  Mentation: Alert oriented to time, place, history taking. Follows all commands speech and language fluent Cranial nerve II-XII: Pupils were equal round reactive to light. Extraocular movements were full, visual field were full on confrontational test. Facial sensation and strength were normal. Uvula tongue midline. Head turning and shoulder shrug  were  normal and symmetric. Motor: The motor testing reveals 5 over 5 strength of all 4 extremities. Good symmetric motor tone is noted throughout.  Sensory: Sensory testing is intact to soft touch on all 4 extremities. No evidence of extinction is noted.  Coordination: Cerebellar testing reveals good finger-nose-finger and heel-to-shin bilaterally.  Gait and station: Gait is normal. Tandem gait is normal. Romberg is negative. No drift is seen.  Reflexes: Deep tendon reflexes are symmetric and normal bilaterally.   DIAGNOSTIC DATA (LABS, IMAGING, TESTING) - I reviewed patient records, labs, notes, testing and imaging myself where available.  Lab Results  Component Value Date   WBC 8.4 11/13/2018   HGB 12.3 11/13/2018   HCT 38.3 11/13/2018   MCV 91 11/13/2018   PLT 308 11/13/2018      Component Value Date/Time   NA 137 07/02/2019 0953   NA 141 11/12/2013 1733   K 4.5 07/02/2019 0953   K 4.1 11/12/2013 1733   CL 101 07/02/2019 0953   CL 107 11/12/2013 1733   CO2 24 07/02/2019 0953   CO2 28 11/12/2013 1733   GLUCOSE 92 07/02/2019 0953   GLUCOSE 103 (H) 02/21/2019 1230   GLUCOSE 123 (H) 11/12/2013 1733   BUN 17 07/02/2019 0953   BUN 9 11/12/2013 1733   CREATININE 1.09 (H) 07/02/2019 0953   CREATININE 1.08 11/12/2013 1733   CALCIUM 10.0 07/02/2019 0953   CALCIUM 9.0 11/12/2013 1733   PROT 6.5 11/13/2018 1028   PROT 7.4 11/12/2013 1733   ALBUMIN 4.3 11/13/2018 1028   ALBUMIN 3.5 11/12/2013 1733   AST 12 11/13/2018 1028   AST 22 11/12/2013 1733   ALT 14 11/13/2018 1028   ALT 26 11/12/2013 1733   ALKPHOS 72 11/13/2018 1028   ALKPHOS 90 11/12/2013 1733   BILITOT <0.2 11/13/2018 1028   BILITOT < 0.1 (L) 11/12/2013 1733   GFRNONAA 58 (L) 07/02/2019 0953   GFRNONAA 58 (L) 11/12/2013 1733   GFRAA 67 07/02/2019 0953   GFRAA >60 11/12/2013 1733   Lab Results  Component Value Date   CHOL 119 07/02/2019   HDL 52 07/02/2019   LDLCALC 50 07/02/2019   TRIG 86 07/02/2019   CHOLHDL  2.3 07/02/2019   Lab Results  Component Value Date  HGBA1C 6.4 (H) 07/02/2019   No results found for: VITAMINB12 Lab Results  Component Value Date   TSH 1.030 05/11/2016      ASSESSMENT AND PLAN 54 y.o. year old female  has a past medical history of Allergy, Anxiety, Asthma, Bilateral carpal tunnel syndrome (12/31/2018), Bipolar affective, manic ([place]) (1996), COPD (chronic obstructive pulmonary disease) ([place]), Depression, HLD (hyperlipidemia) (03/12/2018), Lung nodule, multiple (09/26/2016), Manic depressive illness ([place]) (1996), Seizures ([place]), Substance abuse ([place]), and Tobacco abuse (10/04/2016). here with:  1.  Bilateral hand pain, paresthesia 2.  Neck pain   I spent 15 minutes with the patient. 50% of this time was spent   Butler Denmark, [place], DNP 07/22/2019, 5:52 AM [facility] [address], [place], [place] [address] [phone]

## 2019-11-23 ENCOUNTER — Emergency Department
Admission: EM | Admit: 2019-11-23 | Discharge: 2019-11-23 | Disposition: A | Discharge status: Home / Self Care | Payer: Self-pay | Attending: Emergency Medicine | Admitting: Emergency Medicine

## 2019-11-23 ENCOUNTER — Other Ambulatory Visit: Payer: Self-pay

## 2019-11-23 DIAGNOSIS — Z7951 Long term (current) use of inhaled steroids: Secondary | ICD-10-CM | POA: Insufficient documentation

## 2019-11-23 DIAGNOSIS — J449 Chronic obstructive pulmonary disease, unspecified: Secondary | ICD-10-CM | POA: Insufficient documentation

## 2019-11-23 DIAGNOSIS — Z20822 Contact with and (suspected) exposure to covid-19: Secondary | ICD-10-CM | POA: Insufficient documentation

## 2019-11-23 DIAGNOSIS — Z7982 Long term (current) use of aspirin: Secondary | ICD-10-CM | POA: Insufficient documentation

## 2019-11-23 DIAGNOSIS — J029 Acute pharyngitis, unspecified: Secondary | ICD-10-CM | POA: Insufficient documentation

## 2019-11-23 DIAGNOSIS — F17210 Nicotine dependence, cigarettes, uncomplicated: Secondary | ICD-10-CM | POA: Insufficient documentation

## 2019-11-23 DIAGNOSIS — E119 Type 2 diabetes mellitus without complications: Secondary | ICD-10-CM | POA: Insufficient documentation

## 2019-11-23 DIAGNOSIS — H6501 Acute serous otitis media, right ear: Secondary | ICD-10-CM | POA: Insufficient documentation

## 2019-11-23 DIAGNOSIS — Z7984 Long term (current) use of oral hypoglycemic drugs: Secondary | ICD-10-CM | POA: Insufficient documentation

## 2019-11-23 DIAGNOSIS — I2510 Atherosclerotic heart disease of native coronary artery without angina pectoris: Secondary | ICD-10-CM | POA: Insufficient documentation

## 2019-11-23 LAB — RESPIRATORY PANEL BY RT PCR (FLU A&B, COVID)
Influenza A by PCR: NEGATIVE
Influenza B by PCR: NEGATIVE
SARS Coronavirus 2 by RT PCR: NEGATIVE

## 2019-11-23 LAB — GROUP A STREP BY PCR: Group A Strep by PCR: NOT DETECTED

## 2019-11-23 NOTE — ED Notes (Signed)
Pt brought to triage room 3, explained to pt that PA would be seeing her in there because she is waiting for the fast track to open at [place] patient that we will start what we can and that she may be asked to sit back out in the waiting area until testing is finished and would be brought back to triage room to discuss results and for discharge. Pt agreeable with plan.

## 2019-11-23 NOTE — ED Notes (Signed)
DC instructions discussed with patient, return precautions discussed. Instructed to follow up with PCP this week if not feeling better.

## 2019-11-23 NOTE — ED Provider Notes (Signed)
[facility] Emergency Department Provider Note  ____________________________________________   First MD Initiated Contact with Patient 11/23/19 [phone]     (approximate)  I have reviewed the triage vital signs and the nursing notes.   HISTORY  Chief Complaint Otalgia and Sore Throat   HPI Erica Gomez is a 54 y.o. female who reports to the emergency department for evaluation of right ear pain x2-1/2 weeks.  She has associated pain on the right side of her throat/neck.  She states that she has not had any nasal congestion that she appreciates.  She has had a history of asthma.  She denies cough, shortness of breath, headache, fever, abdominal pain.  She has not had any known Covid exposures that she is aware of.          Past Medical History:  Diagnosis Date  . Allergy   . Anxiety   . Asthma   . Bilateral carpal tunnel syndrome 12/31/2018  . Bipolar affective, manic ([place]) 1996  . COPD (chronic obstructive pulmonary disease) ([place])   . Depression   . HLD (hyperlipidemia) 03/12/2018  . Lung nodule, multiple 09/26/2016  . Manic depressive illness ([place]) 1996  . Seizures ([place])   . Substance abuse ([place])   . Tobacco abuse 10/04/2016    Patient Active Problem List   Diagnosis Date Noted  . Increased heart rate 04/09/2019  . Carpal tunnel syndrome on left   . Carpal tunnel syndrome on right 01/14/2019  . De Quervain's tenosynovitis, left 01/14/2019  . Bilateral carpal tunnel syndrome 12/31/2018  . Pain in both hands 12/24/2018  . Neck pain on right side 12/24/2018  . Prediabetes 11/21/2018  . Bilateral hand pain 11/07/2018  . Health care maintenance 11/07/2018  . Neck pain 11/07/2018  . Anxiety 03/12/2018  . COPD with acute exacerbation ([place]) 03/12/2018  . HLD (hyperlipidemia) 03/12/2018  . CAD (coronary artery disease), native coronary artery 02/23/2017  . Chest pain 02/22/2017  . Chest pain with moderate risk for cardiac etiology 02/22/2017  .  Tobacco abuse 10/04/2016  . Lung nodule, multiple 09/26/2016  . Diabetes ([place]) 04/03/2014  . COPD (chronic obstructive pulmonary disease) ([place]) 12/17/2012  . Seizure ([place]) 12/17/2012    Past Surgical History:  Procedure Laterality Date  . ABDOMINAL HYSTERECTOMY    . CARPAL TUNNEL RELEASE Right 01/15/2019   Procedure: RIGHTCARPAL TUNNEL RELEASE, LEFT DEQUERVAINS INJECTION;  Surgeon: Leandrew Koyanagi, MD;  Location: [place];  Service: Orthopedics;  Laterality: Right;  . CARPAL TUNNEL RELEASE Left 02/26/2019   Procedure: LEFT CARPAL TUNNEL RELEASE;  Surgeon: Leandrew Koyanagi, MD;  Location: [place];  Service: Orthopedics;  Laterality: Left;  Bier block  . COLONOSCOPY WITH PROPOFOL N/A 11/28/2018   Procedure: COLONOSCOPY WITH PROPOFOL;  Surgeon: [name], MD;  Location: [facility] ENDOSCOPY;  Service: Gastroenterology;  Laterality: N/A;  . TUBAL LIGATION      Prior to Admission medications   Medication Sig Start Date End Date Taking? Authorizing Provider  albuterol (VENTOLIN HFA) 108 (90 Base) MCG/ACT inhaler INHALE 2 PUFFS EVERY 4 TO 6 HOURS AS NEEDED 11/07/18   Iloabachie, Chioma E, NP  aspirin EC 81 MG tablet Take 1 tablet (81 mg total) by mouth daily. No ibuprofen for at least one hour 09/26/16   Arnetha Courser, MD  blood glucose meter kit and supplies KIT Dispense based on patient and insurance preference. Use up to four times daily as directed. (FOR ICD-9 250.00, 250.01). 04/09/19  Iloabachie, Chioma E, NP  citalopram (CELEXA) 20 MG tablet Take 20 mg by mouth daily.    [provider]  Fluticasone-Salmeterol (ADVAIR DISKUS) 500-50 MCG/DOSE AEPB Inhale 1 puff into the lungs 2 (two) times daily. 04/09/19   Iloabachie, Chioma E, NP  gabapentin (NEURONTIN) 100 MG capsule Take 1 capsule (100 mg total) by mouth 3 (three) times daily. 01/01/19   Iloabachie, Chioma E, NP  ipratropium-albuterol (DUONEB) 0.5-2.5 (3) MG/3ML SOLN Take 3 mLs by nebulization every 6  (six) hours. 04/09/19   Iloabachie, Chioma E, NP  metFORMIN (GLUCOPHAGE) 1000 MG tablet Take 0.5 tablets (500 mg total) by mouth 2 (two) times daily with a meal. 04/09/19   Iloabachie, Chioma E, NP  risperiDONE (RISPERDAL) 2 MG tablet Take 2 mg by mouth daily.    [provider]  rosuvastatin (CRESTOR) 10 MG tablet Take 1 tablet (10 mg total) by mouth daily. 03/04/19   Minna Merritts, MD  tiotropium (SPIRIVA HANDIHALER) 18 MCG inhalation capsule Place 1 capsule (18 mcg total) into inhaler and inhale daily. 11/07/18 11/07/19  Iloabachie, Chioma E, NP  varenicline (CHANTIX CONTINUING MONTH PAK) 1 MG tablet Take 1 tablet (1 mg total) by mouth 2 (two) times daily. 03/04/19   Minna Merritts, MD    Allergies Flax seed oil [bio-flax]  Family History  Problem Relation Age of Onset  . Stroke Mother   . Heart attack Father     Social History Social History   Tobacco Use  . Smoking status: Current Every Day Smoker    Packs/day: 1.00    Years: 36.00    Pack years: 36.00    Types: Cigarettes  . Smokeless tobacco: Former Systems developer    Types: Chew, Snuff  . Tobacco comment: Using chew to wean herself off cigarettes  Vaping Use  . Vaping Use: Former  Substance Use Topics  . Alcohol use: No    Comment: "Occasionally"  . Drug use: No    Types: "Crack" cocaine    Comment: history 2013    Review of Systems  Constitutional: No fever/chills Eyes: No visual changes. ENT: + Right ear, + sore throat Cardiovascular: Denies chest pain. Respiratory: Denies shortness of breath. Gastrointestinal: No abdominal pain.  No nausea, no vomiting.  No diarrhea.  No constipation. Genitourinary: Negative for dysuria. Musculoskeletal: Negative for back pain. Skin: Negative for rash. Neurological: Negative for headaches, focal weakness or numbness.  ____________________________________________   PHYSICAL EXAM:  VITAL SIGNS: ED Triage Vitals  Enc Vitals Group     BP 11/23/19 0713 140/83      Pulse Rate 11/23/19 0713 90     Resp 11/23/19 0713 16     Temp 11/23/19 0713 98 F (36.7 C)     Temp src --      SpO2 11/23/19 0713 94 %     Weight 11/23/19 0714 189 lb (85.7 kg)     Height 11/23/19 0714 _0  (1.676 m)     Head Circumference --      Peak Flow --      Pain Score 11/23/19 0713 7     Pain Loc --      Pain Edu? --      Excl. in [place]? --     Constitutional: Alert and oriented. Well appearing and in no acute distress. Eyes: Conjunctivae are normal. PERRL. EOMI. Head: Atraumatic. Nose: No congestion/rhinnorhea. Ears: The left TM is pearly gray with all appropriate landmarks identified with no bulging noted.  The right TM  has pearly gray color with fluid posterior to the TM but no erythema or bulging. Mouth/Throat: Mucous membranes are moist.  Oropharynx non-erythematous. Neck: No stridor.   Lymphatic: There is some shotty right-sided cervical lymphadenopathy that is tender.  No left-sided cervical lymphadenopathy. Cardiovascular: Normal rate, regular rhythm. Grossly normal heart sounds.  Good peripheral circulation. Respiratory: Normal respiratory effort.  No retractions. Lungs CTAB. Musculoskeletal: No lower extremity tenderness nor edema.  No joint effusions. Neurologic:  Normal speech and language. No gross focal neurologic deficits are appreciated. No gait instability. Skin:  Skin is warm, dry and intact. No rash noted. Psychiatric: Mood and affect are normal. Speech and behavior are normal.  ____________________________________________   LABS (all labs ordered are listed, but only abnormal results are displayed)  Labs Reviewed  GROUP A STREP BY PCR  RESPIRATORY PANEL BY RT PCR (FLU A&B, COVID)   ____________________________________________   INITIAL IMPRESSION / ASSESSMENT AND PLAN / ED COURSE  As part of my medical decision making, I reviewed the following data within the [place] notes reviewed and incorporated        Erica Gomez is a 54 year old female who presents to the emergency department for evaluation of right ear pain and some pain on the right side of her neck.  Been persistent for 2-1/2 weeks without any other associated symptoms.  Physical exam reveals a serous fluid behind the right TM.  There is no erythema of the oropharynx but there is some shotty cervical lymphadenopathy on the right-hand side that is tender for her and reproduces the pain that she is that she is experiencing.  Strep swab and Covid swab were sent for evaluation and are all negative.  At this time, feel that her exams and tests are most consistent with a serous otitis media.  Recommended that she treat with Flonase as well as an anti-inflammatory.  The patient does not have insurance and wishes to get these over-the-counter instead of being prescribed for her.  It was recommended that she follow-up with primary care or return to the emergency department if she experiences any worsening.      ____________________________________________   FINAL CLINICAL IMPRESSION(S) / ED DIAGNOSES  Final diagnoses:  Non-recurrent acute serous otitis media of right ear     ED Discharge Orders    None      *Please note:  Erica Gomez was evaluated in Emergency Department on 11/23/2019 for the symptoms described in the history of present illness. She was evaluated in the context of the global COVID-19 pandemic, which necessitated consideration that the patient might be at risk for infection with the SARS-CoV-2 virus that causes COVID-19. Institutional protocols and algorithms that pertain to the evaluation of patients at risk for COVID-19 are in a state of rapid change based on information released by regulatory bodies including the CDC and federal and state organizations. These policies and algorithms were followed during the patient's care in the ED.  Some ED evaluations and interventions may be delayed as a result of limited staffing during and  the pandemic.*   Note:  This document was prepared using Dragon voice recognition software and may include unintentional dictation errors.    Marlana Salvage, PA 11/23/19 1538    Duffy Bruce, MD 11/23/19 2135

## 2019-11-23 NOTE — Discharge Instructions (Signed)
Please pick up Flonase (also called fluticasone) at the pharmacy. You may also use an anti-inflammatory such as ibuprofen or aleve. Please return if your symptoms worsen, or follow up with your primary care if they don't improve.

## 2019-11-23 NOTE — ED Triage Notes (Signed)
Pt arrives via pov from home. Reports ear pain and sore throat x 2 weeks. Pt report pain has increased and now having trouble closing jaw, and swallowing without causing pain. Denies any SOB. NAD noted at this time.

## 2019-12-07 ENCOUNTER — Other Ambulatory Visit: Payer: Self-pay

## 2019-12-07 ENCOUNTER — Emergency Department (HOSPITAL_COMMUNITY)
Admission: EM | Admit: 2019-12-07 | Discharge: 2019-12-08 | Disposition: A | Discharge status: Home / Self Care | Payer: Self-pay | Attending: Emergency Medicine | Admitting: Emergency Medicine

## 2019-12-07 ENCOUNTER — Encounter (HOSPITAL_COMMUNITY): Payer: Self-pay | Admitting: Emergency Medicine

## 2019-12-07 ENCOUNTER — Emergency Department (HOSPITAL_COMMUNITY): Payer: Self-pay

## 2019-12-07 DIAGNOSIS — Z79899 Other long term (current) drug therapy: Secondary | ICD-10-CM | POA: Insufficient documentation

## 2019-12-07 DIAGNOSIS — F17210 Nicotine dependence, cigarettes, uncomplicated: Secondary | ICD-10-CM | POA: Insufficient documentation

## 2019-12-07 DIAGNOSIS — I2510 Atherosclerotic heart disease of native coronary artery without angina pectoris: Secondary | ICD-10-CM | POA: Insufficient documentation

## 2019-12-07 DIAGNOSIS — R569 Unspecified convulsions: Secondary | ICD-10-CM | POA: Insufficient documentation

## 2019-12-07 DIAGNOSIS — S0083XA Contusion of other part of head, initial encounter: Secondary | ICD-10-CM | POA: Insufficient documentation

## 2019-12-07 DIAGNOSIS — Z7982 Long term (current) use of aspirin: Secondary | ICD-10-CM | POA: Insufficient documentation

## 2019-12-07 DIAGNOSIS — J441 Chronic obstructive pulmonary disease with (acute) exacerbation: Secondary | ICD-10-CM | POA: Insufficient documentation

## 2019-12-07 DIAGNOSIS — E119 Type 2 diabetes mellitus without complications: Secondary | ICD-10-CM | POA: Insufficient documentation

## 2019-12-07 LAB — CBC WITH DIFFERENTIAL/PLATELET
Abs Immature Granulocytes: 0.03 10*3/uL (ref 0.00–0.07)
Basophils Absolute: 0 10*3/uL (ref 0.0–0.1)
Basophils Relative: 0 %
Eosinophils Absolute: 0.2 10*3/uL (ref 0.0–0.5)
Eosinophils Relative: 2 %
HCT: 40.4 % (ref 36.0–46.0)
Hemoglobin: 12.8 g/dL (ref 12.0–15.0)
Immature Granulocytes: 0 %
Lymphocytes Relative: 25 %
Lymphs Abs: 2.4 10*3/uL (ref 0.7–4.0)
MCH: 29.6 pg (ref 26.0–34.0)
MCHC: 31.7 g/dL (ref 30.0–36.0)
MCV: 93.3 fL (ref 80.0–100.0)
Monocytes Absolute: 0.7 10*3/uL (ref 0.1–1.0)
Monocytes Relative: 7 %
Neutro Abs: 6.4 10*3/uL (ref 1.7–7.7)
Neutrophils Relative %: 66 %
Platelets: 279 10*3/uL (ref 150–400)
RBC: 4.33 MIL/uL (ref 3.87–5.11)
RDW: 12.9 % (ref 11.5–15.5)
WBC: 9.8 10*3/uL (ref 4.0–10.5)
nRBC: 0 % (ref 0.0–0.2)

## 2019-12-07 LAB — COMPREHENSIVE METABOLIC PANEL
ALT: 17 U/L (ref 0–44)
AST: 17 U/L (ref 15–41)
Albumin: 3.8 g/dL (ref 3.5–5.0)
Alkaline Phosphatase: 58 U/L (ref 38–126)
Anion gap: 11 (ref 5–15)
BUN: 14 mg/dL (ref 6–20)
CO2: 24 mmol/L (ref 22–32)
Calcium: 9.5 mg/dL (ref 8.9–10.3)
Chloride: 104 mmol/L (ref 98–111)
Creatinine, Ser: 1.17 mg/dL — ABNORMAL HIGH (ref 0.44–1.00)
GFR, Estimated: 55 mL/min — ABNORMAL LOW (ref >60)
Glucose, Bld: 139 mg/dL — ABNORMAL HIGH (ref 70–99)
Potassium: 3.8 mmol/L (ref 3.5–5.1)
Sodium: 139 mmol/L (ref 135–145)
Total Bilirubin: 0.7 mg/dL (ref 0.3–1.2)
Total Protein: 6.5 g/dL (ref 6.5–8.1)

## 2019-12-07 LAB — I-STAT BETA HCG BLOOD, ED (MC, WL, AP ONLY): I-stat hCG, quantitative: <5 m[IU]/mL (ref <5)

## 2019-12-07 NOTE — ED Provider Notes (Signed)
[facility] EMERGENCY DEPARTMENT Provider Note   CSN: [id number] Arrival date & time: 12/07/19  2118     History Chief Complaint  Patient presents with  . Seizures    Erica Gomez is a 54 y.o. female.  54 year old female with a history of COPD, dyslipidemia, seizures, bipolar affective disorder, substance abuse presents to the emergency department alleging multiple seizures.  She states that she has a history of seizure-like activity in her 55s, but never on medications and never followed up with neurology.  She began having seizures again 6 to 8 weeks ago.  She reports that she "can feel them come on"; will start coughing and feel a shaking in her body and then be unable to speak.  States that shaking will progress, causing her to fall.  Sustained facial trauma from seizure-like activity a few days ago.  Alleges two episodes at work today.  Was told she was "red in the face" and her "whole body was shaking".  Believes she has been incontinent at times during her seizure episodes.  Has not followed up with her PCP regarding her seizure-like activity.  No preceding headache, vision changes, paresthesias.  Further denies associated fever, nausea, vomiting, tongue biting.  She is not on chronic anticoagulation.  Denies FHx of seizure disorder.  Denies recent illicit drug use; last used crack cocaine 8 years ago.  Reports drinking 2 alcoholic beverages/wk.  The history is provided by the patient. No language interpreter was used.  Seizures      Past Medical History:  Diagnosis Date  . Allergy   . Anxiety   . Asthma   . Bilateral carpal tunnel syndrome 12/31/2018  . Bipolar affective, manic ([place]) 1996  . COPD (chronic obstructive pulmonary disease) ([place])   . Depression   . HLD (hyperlipidemia) 03/12/2018  . Lung nodule, multiple 09/26/2016  . Manic depressive illness ([place]) 1996  . Seizures ([place])   . Substance abuse ([place])   . Tobacco abuse 10/04/2016    Patient  Active Problem List   Diagnosis Date Noted  . Increased heart rate 04/09/2019  . Carpal tunnel syndrome on left   . Carpal tunnel syndrome on right 01/14/2019  . De Quervain's tenosynovitis, left 01/14/2019  . Bilateral carpal tunnel syndrome 12/31/2018  . Pain in both hands 12/24/2018  . Neck pain on right side 12/24/2018  . Prediabetes 11/21/2018  . Bilateral hand pain 11/07/2018  . Health care maintenance 11/07/2018  . Neck pain 11/07/2018  . Anxiety 03/12/2018  . COPD with acute exacerbation ([place]) 03/12/2018  . HLD (hyperlipidemia) 03/12/2018  . CAD (coronary artery disease), native coronary artery 02/23/2017  . Chest pain 02/22/2017  . Chest pain with moderate risk for cardiac etiology 02/22/2017  . Tobacco abuse 10/04/2016  . Lung nodule, multiple 09/26/2016  . Diabetes ([place]) 04/03/2014  . COPD (chronic obstructive pulmonary disease) ([place]) 12/17/2012  . Seizure ([place]) 12/17/2012    Past Surgical History:  Procedure Laterality Date  . ABDOMINAL HYSTERECTOMY    . CARPAL TUNNEL RELEASE Right 01/15/2019   Procedure: RIGHTCARPAL TUNNEL RELEASE, LEFT DEQUERVAINS INJECTION;  Surgeon: Leandrew Koyanagi, MD;  Location: [place];  Service: Orthopedics;  Laterality: Right;  . CARPAL TUNNEL RELEASE Left 02/26/2019   Procedure: LEFT CARPAL TUNNEL RELEASE;  Surgeon: Leandrew Koyanagi, MD;  Location: [place];  Service: Orthopedics;  Laterality: Left;  Bier block  . COLONOSCOPY WITH PROPOFOL N/A 11/28/2018   Procedure: COLONOSCOPY WITH PROPOFOL;  Surgeon:  [name], MD;  Location: [facility] ENDOSCOPY;  Service: Gastroenterology;  Laterality: N/A;  . TUBAL LIGATION       OB History   No obstetric history on file.     Family History  Problem Relation Age of Onset  . Stroke Mother   . Heart attack Father     Social History   Tobacco Use  . Smoking status: Current Every Day Smoker    Packs/day: 1.00    Years: 36.00    Pack years: 36.00    Types:  Cigarettes  . Smokeless tobacco: Former Systems developer    Types: Chew, Snuff  . Tobacco comment: Using chew to wean herself off cigarettes  Vaping Use  . Vaping Use: Former  Substance Use Topics  . Alcohol use: No    Comment: "Occasionally"  . Drug use: No    Types: "Crack" cocaine    Comment: history 2013    Home Medications Prior to Admission medications   Medication Sig Start Date End Date Taking? Authorizing Provider  albuterol (VENTOLIN HFA) 108 (90 Base) MCG/ACT inhaler INHALE 2 PUFFS EVERY 4 TO 6 HOURS AS NEEDED Patient taking differently: Inhale 2 puffs into the lungs every 4 (four) hours as needed for wheezing or shortness of breath. INHALE 2 PUFFS EVERY 4 TO 6 HOURS AS NEEDED 11/07/18  Yes Iloabachie, Chioma E, NP  aspirin EC 81 MG tablet Take 1 tablet (81 mg total) by mouth daily. No ibuprofen for at least one hour 09/26/16  Yes Lada, Satira Anis, MD  citalopram (CELEXA) 20 MG tablet Take 20 mg by mouth daily.   Yes [provider]  Fluticasone-Salmeterol (ADVAIR DISKUS) 500-50 MCG/DOSE AEPB Inhale 1 puff into the lungs 2 (two) times daily. 04/09/19  Yes Iloabachie, Chioma E, NP  lamoTRIgine (LAMICTAL) 100 MG tablet Take 100 mg by mouth daily. 06/13/19  Yes [provider]  metFORMIN (GLUCOPHAGE) 1000 MG tablet Take 0.5 tablets (500 mg total) by mouth 2 (two) times daily with a meal. 04/09/19  Yes Iloabachie, Chioma E, NP  risperiDONE (RISPERDAL) 2 MG tablet Take 2 mg by mouth daily.   Yes [provider]  tiotropium (SPIRIVA HANDIHALER) 18 MCG inhalation capsule Place 1 capsule (18 mcg total) into inhaler and inhale daily. 11/07/18 12/08/19 Yes Iloabachie, Chioma E, NP  blood glucose meter kit and supplies KIT Dispense based on patient and insurance preference. Use up to four times daily as directed. (FOR ICD-9 250.00, 250.01). 04/09/19   Iloabachie, Chioma E, NP  gabapentin (NEURONTIN) 100 MG capsule Take 1 capsule (100 mg total) by mouth 3 (three) times  daily. Patient not taking: Reported on 12/08/2019 01/01/19   Iloabachie, Chioma E, NP  ipratropium-albuterol (DUONEB) 0.5-2.5 (3) MG/3ML SOLN Take 3 mLs by nebulization every 6 (six) hours. Patient taking differently: Take 3 mLs by nebulization every 6 (six) hours as needed (asthma).  04/09/19   Iloabachie, Chioma E, NP  levETIRAcetam (KEPPRA) 500 MG tablet Take 1 tablet (500 mg total) by mouth 2 (two) times daily. 12/08/19   Antonietta Breach, PA-C  rosuvastatin (CRESTOR) 10 MG tablet Take 1 tablet (10 mg total) by mouth daily. Patient not taking: Reported on 12/08/2019 03/04/19   Minna Merritts, MD    Allergies    Flax seed oil [bio-flax]  Review of Systems   Review of Systems  Neurological: Positive for seizures.  Ten systems reviewed and are negative for acute change, except as noted in the HPI.    Physical Exam Updated Vital Signs  BP (!) 136/92   Pulse 88   Temp 98.1 F (36.7 C) (Oral)   Resp 16   Ht _0  (1.676 m)   Wt 90 kg   SpO2 94%   BMI 32.02 kg/m   Physical Exam Vitals and nursing note reviewed.  Constitutional:      General: She is not in acute distress.    Appearance: She is well-developed. She is not diaphoretic.     Comments: Nontoxic appearing and in NAD  HENT:     Head: Normocephalic.      Comments: Contusion to forehead. Abrasion to philtrum.     Right Ear: Tympanic membrane, ear canal and external ear normal.     Left Ear: Tympanic membrane, ear canal and external ear normal.     Ears:     Comments: No hemotympanum bilaterally Eyes:     General: No scleral icterus.    Conjunctiva/sclera: Conjunctivae normal.     Comments: Bilateral periorbital contusions. No pain with eye movement. No nystagmus.  Neck:     Comments: No meningismus Cardiovascular:     Rate and Rhythm: Normal rate and regular rhythm.     Pulses: Normal pulses.  Pulmonary:     Effort: Pulmonary effort is normal. No respiratory distress.     Comments: Respirations even and  unlabored Musculoskeletal:        General: Normal range of motion.     Cervical back: Normal range of motion.  Skin:    General: Skin is warm and dry.     Coloration: Skin is not pale.     Findings: No erythema or rash.  Neurological:     Mental Status: She is alert and oriented to person, place, and time.     Coordination: Coordination normal.     Comments: GCS 15. Speech is goal oriented. No cranial nerve deficits appreciated; symmetric eyebrow raise, no facial drooping, tongue midline. Patient has equal grip strength bilaterally with 5/5 strength against resistance in all major muscle groups bilaterally. Sensation to light touch intact. Patient moves extremities without ataxia. No pronator drift. Ambulation not assessed.  Psychiatric:        Behavior: Behavior normal.     ED Results / Procedures / Treatments   Labs (all labs ordered are listed, but only abnormal results are displayed) Labs Reviewed  COMPREHENSIVE METABOLIC PANEL - Abnormal; Notable for the following components:      Result Value   Glucose, Bld 139 (*)    Creatinine, Ser 1.17 (*)    GFR, Estimated 55 (*)    All other components within normal limits  URINALYSIS, ROUTINE W REFLEX MICROSCOPIC - Abnormal; Notable for the following components:   APPearance HAZY (*)    Nitrite POSITIVE (*)    All other components within normal limits  CBC WITH DIFFERENTIAL/PLATELET  MAGNESIUM  RAPID URINE DRUG SCREEN, HOSP PERFORMED  ETHANOL  I-STAT BETA HCG BLOOD, ED ([facility], [facility], [facility] ONLY)    EKG None  Radiology CT Head Wo Contrast  Result Date: 12/08/2019 CLINICAL DATA:  Seizure, acute, history of trauma EXAM: CT HEAD WITHOUT CONTRAST TECHNIQUE: Contiguous axial images were obtained from the base of the skull through the vertex without intravenous contrast. COMPARISON:  Head CT 11/12/2013 FINDINGS: Brain: No intracranial hemorrhage, mass effect, or midline shift. No hydrocephalus. The basilar cisterns are patent. No evidence of  territorial infarct or acute ischemia. No extra-axial or intracranial fluid collection. Vascular: Atherosclerosis of skullbase vasculature without hyperdense vessel or abnormal calcification. Skull:  No fracture or focal lesion. Sinuses/Orbits: Assessed on concurrent face CT, reported separately Other: Left frontal scalp hematoma. IMPRESSION: Left frontal scalp hematoma. No acute intracranial abnormality. No skull fracture. Electronically Signed   By: Keith Rake M.D.   On: 12/08/2019 00:02   CT Maxillofacial Wo Contrast  Result Date: 12/08/2019 CLINICAL DATA:  Head trauma, minor, normal mental status (Age 14-64y) seizure; facial contusions EXAM: CT MAXILLOFACIAL WITHOUT CONTRAST TECHNIQUE: Multidetector CT imaging of the maxillofacial structures was performed. Multiplanar CT image reconstructions were also generated. COMPARISON:  None. FINDINGS: Osseous: Nasal bone, zygomatic arches, and mandibles are intact. No acute fracture. The temporomandibular joints are congruent. Occasional absent teeth. Orbits: No orbital fracture or globe injury. No orbital inflammation. Sinuses: No sinus fracture or fluid level. No significant paranasal sinus inflammation. Mastoid air cells are clear. Soft tissues: Left frontal scalp hematoma. Limited intracranial: Assessed on concurrent head CT, reported separately. IMPRESSION: Left frontal scalp hematoma. No facial bone fracture. Electronically Signed   By: Keith Rake M.D.   On: 12/08/2019 00:04    Procedures Procedures (including critical care time)  Medications Ordered in ED Medications  levETIRAcetam (KEPPRA) tablet 500 mg (500 mg Oral Given 12/08/19 0301)    ED Course  I have reviewed the triage vital signs and the nursing notes.  Pertinent labs & imaging results that were available during my care of the patient were reviewed by me and considered in my medical decision making (see chart for details).  Clinical Course as of Dec 07 420  Mon Dec 08, 2019  0221 Results communicated to patient. She verbalizes understanding. No additional complaints.   [KH]  0246 Upon review of patient's chart, it appears that she is on Lamictal.  She states that she was started on this by her psychiatric provider after she was titrated off of Risperdal.  Case discussed with Dr. Curly Shores of neurology given that patient is currently on an antiepileptic agent, though not presently therapeutic for seizures.  Initially, Depakote felt to be a better agent than Keppra due to mood stability; however, there is an interaction with Lamictal.  As a result, Dr. Curly Shores feels that Keppra 500 twice daily is reasonable until she is able to follow-up with neurology.  Counseled patient on need to monitor for mood changes while on this medication.     [KH]  [phone] Spoke with patient via cell phone. Notified by Dr. Curly Shores after patient discharge of the recommendation to also increase Lamictal to BID. Patient aware of change; verbalizes understanding to take Lamictal 11m BID and Keppra 5038mBID. Encouraged return if symptoms worsen. All questions answered.   [KH]    Clinical Course User Index [KH] HuBeverely Pace MDM Rules/Calculators/A&P                          [age]ear old female presents to the emergency department for evaluation of reported seizure-like activity.  She has not had any seizure activity witnessed in the ED since arrival.  Reports history of these episodes in her 20's which spontaneously resolved, though she never sought neurology follow-up at that time and was never placed on seizure medications.  Denies any family history of seizure disorder.  States that her seizure-like activity began to recur 6 to 8 weeks ago.  Does have a history of bipolar disorder.  Patient with obvious facial trauma.  She states this was sustained from a fall due to a seizure a few days ago.  Reports having 2 seizure-like episodes today, one while at work.  Imaging of head and facial  bones obtained.  This is negative for acute fracture, dislocation.  No intracranial abnormality.  Labs today reveal no leukocytosis, anemia, significant electrolyte derangements.  Creatinine is stable compared to baseline.  Ethanol and UDS are negative.  Given patient's stated history, will place on driving precautions and start on Keppra twice daily pending neurology follow-up.  Lamictal also increased to 149m BID. These changes were made following Neurology consultation with the presumption that the KShermanshould be able to be decreased with another lamotrigine increase by the outpatient neurologist. [facility]  Will refer back for outpatient evaluation.  Return precautions discussed and provided. Patient discharged in stable condition with no unaddressed concerns.   Final Clinical Impression(s) / ED Diagnoses Final diagnoses:  [place]  Facial contusion, initial encounter    Rx / DC Orders ED Discharge Orders         Ordered    Ambulatory referral to Neurology       Comments: An appointment is requested in approximately: 2 weeks for new onset seizures; recurrent   12/08/19 0223    levETIRAcetam (KEPPRA) 500 MG tablet  2 times daily,   Status:  Discontinued        12/08/19 0223    levETIRAcetam (KEPPRA) 500 MG tablet  2 times daily        12/08/19 0250           HAntonietta Breach PA-C 12/08/19 0424    CFatima Blank MD 12/08/19 2037

## 2019-12-07 NOTE — ED Triage Notes (Signed)
Patient reports multiple seizures this week and 2 today , presents with old bruises at forehead and periorbital bruises/swelling , respirations unlabored/alert and oriented .

## 2019-12-08 DIAGNOSIS — R569 Unspecified convulsions: Secondary | ICD-10-CM

## 2019-12-08 LAB — URINALYSIS, ROUTINE W REFLEX MICROSCOPIC
Bacteria, UA: NONE SEEN
Bilirubin Urine: NEGATIVE
Glucose, UA: NEGATIVE mg/dL
Hgb urine dipstick: NEGATIVE
Ketones, ur: NEGATIVE mg/dL
Leukocytes,Ua: NEGATIVE
Nitrite: POSITIVE — AB
Protein, ur: NEGATIVE mg/dL
Specific Gravity, Urine: 1.018 (ref 1.005–1.030)
pH: 6 (ref 5.0–8.0)

## 2019-12-08 LAB — RAPID URINE DRUG SCREEN, HOSP PERFORMED
Amphetamines: NOT DETECTED
Barbiturates: NOT DETECTED
Benzodiazepines: NOT DETECTED
Cocaine: NOT DETECTED
Opiates: NOT DETECTED
Tetrahydrocannabinol: NOT DETECTED

## 2019-12-08 LAB — MAGNESIUM: Magnesium: 2.2 mg/dL (ref 1.7–2.4)

## 2019-12-08 LAB — ETHANOL: Alcohol, Ethyl (B): <10 mg/dL (ref <10)

## 2019-12-08 MED ORDER — LEVETIRACETAM 500 MG PO TABS
500.0000 mg | ORAL_TABLET | Freq: Once | ORAL | Status: AC
  Administered 2019-12-08: 500 mg via ORAL

## 2019-12-08 MED ORDER — LEVETIRACETAM 500 MG PO TABS: 500.0000 mg | ORAL_TABLET | Freq: Two times a day (BID) | ORAL | 0 refills | Status: DC

## 2019-12-08 MED ORDER — LEVETIRACETAM 500 MG PO TABS
1000.0000 mg | ORAL_TABLET | Freq: Once | ORAL | Status: DC
  Filled 2019-12-08: qty 2

## 2019-12-08 MED ORDER — LEVETIRACETAM 500 MG PO TABS: 500.0000 mg | ORAL_TABLET | Freq: Two times a day (BID) | ORAL | 1 refills | Status: DC

## 2019-12-08 NOTE — Progress Notes (Signed)
This patient was discussed briefly with the focus question on AED management while awaiting outpatient follow-up.  In brief, the details shared with me are that she is a 54 year old woman with a prior history of seizures in her 20s now with recurrent seizures in the past 2 months.  Her labs are reassuring, she has been free of seizure activity while in the ED, and she has a nonfocal neurological examination per the ED provider.  Head CT personally reviewed by me is negative for an acute intracranial process, though it does demonstrate a left scalp hematoma.  Per the provider, she is psychiatrically stable at this time.  ED provider was initially going to start [place] while awaiting outpatient follow-up.  We discussed the utility of Depakote as a dual mood/seizure agent, however there is a significantly increased risk of SJS and drug interactions given the patient is already on lamotrigine (at mood disorder dosing of 1000 mg daily).  Therefore I suggested the most reasonable course of action is to plan for a short course of Keppra, with counseling to the patient/husband who is at bedside to observe for psychiatric decompensation, to bridge patient to her outpatient appointment where a plan can be made to likely increase lamotrigine to an antiseizure dose gradually over time.  The Keppra can then be gradually tapered, or if her seizures are uncontrolled on a single agent her outpatient neurologist can consider addition of a second antiseizure medication.   Recommendations  - Continue keppra 1000 mg BID - Increase lamotrigine to 100 mg BID (from daily)  These are brief recommendations made based on data provided by the requesting provider and limited review of the chart.  Should a full evaluation of the patient be desired, a formal consult should be requested.

## 2019-12-08 NOTE — Discharge Instructions (Addendum)
Your evaluation in the ED was reassuring. Given reports of recurrent seizure-like activity over the past 6-8 weeks, you have been started on Keppra. Take this medication as prescribed. Follow up with [facility]. You are unable to drive a motored vehicle or car or operate heavy machinery for 6 months or until cleared by a Neurologist.

## 2019-12-16 ENCOUNTER — Ambulatory Visit: Payer: Self-pay | Admitting: Gerontology

## 2019-12-16 ENCOUNTER — Other Ambulatory Visit: Payer: Self-pay | Admitting: Gerontology

## 2019-12-16 ENCOUNTER — Other Ambulatory Visit: Payer: Self-pay

## 2019-12-16 ENCOUNTER — Encounter: Payer: Self-pay | Admitting: Gerontology

## 2019-12-16 VITALS — BP 124/74 | HR 92 | Temp 97.6°F | Resp 16 | Wt 181.0 lb

## 2019-12-16 DIAGNOSIS — Z0000 Encounter for general adult medical examination without abnormal findings: Secondary | ICD-10-CM

## 2019-12-16 DIAGNOSIS — E119 Type 2 diabetes mellitus without complications: Secondary | ICD-10-CM

## 2019-12-16 DIAGNOSIS — J439 Emphysema, unspecified: Secondary | ICD-10-CM

## 2019-12-16 DIAGNOSIS — Z09 Encounter for follow-up examination after completed treatment for conditions other than malignant neoplasm: Secondary | ICD-10-CM

## 2019-12-16 DIAGNOSIS — R569 Unspecified convulsions: Secondary | ICD-10-CM

## 2019-12-16 MED ORDER — ROSUVASTATIN CALCIUM 10 MG PO TABS: 10.0000 mg | ORAL_TABLET | Freq: Every day | ORAL | 4 refills | Status: DC

## 2019-12-16 MED ORDER — IPRATROPIUM-ALBUTEROL 0.5-2.5 (3) MG/3ML IN SOLN: 3.0000 mL | Freq: Four times a day (QID) | RESPIRATORY_TRACT | 2 refills | Status: DC

## 2019-12-16 MED ORDER — LORATADINE 10 MG PO TABS: 10.0000 mg | ORAL_TABLET | Freq: Every day | ORAL | 3 refills | Status: DC | PRN

## 2019-12-16 MED ORDER — ALBUTEROL SULFATE HFA 108 (90 BASE) MCG/ACT IN AERS: INHALATION_SPRAY | RESPIRATORY_TRACT | 0 refills | Status: DC

## 2019-12-16 MED ORDER — SPIRIVA HANDIHALER 18 MCG IN CAPS: 18.0000 ug | ORAL_CAPSULE | Freq: Every day | RESPIRATORY_TRACT | 11 refills | Status: DC

## 2019-12-16 MED ORDER — LEVETIRACETAM 500 MG PO TABS: 500.0000 mg | ORAL_TABLET | Freq: Two times a day (BID) | ORAL | 0 refills | Status: DC

## 2019-12-16 MED ORDER — METFORMIN HCL 1000 MG PO TABS: 500.0000 mg | ORAL_TABLET | Freq: Two times a day (BID) | ORAL | 3 refills | Status: DC

## 2019-12-16 NOTE — Progress Notes (Signed)
Established Patient Office Visit  Subjective:  Patient ID: Erica Gomez, female    DOB: 11/05/1965  Age: 54 y.o. MRN: [id number]  CC: No chief complaint on file.   HPI Erica Gomez presents for follow up of hospital discharge, COPD, Seizures and medication refill. She states that she's compliant with her medications and continues to make healthy life style changes. She was seen at the ED on 12/07/2019 for seizure like activity and was started on Keppra 500 mg bid and Lamotrigine 100 mg daily. She denies seizure activities since hospital discharge and she will follow up with Neurologist on 12/25/2019. She states that her breathing is stable and complies with her medications. She states that she smokes 4 cigarettes daily and continues to work on quitting. Overall, she states that she's doing well and offers no further complaint.  Past Medical History:  Diagnosis Date  . Allergy   . Anxiety   . Asthma   . Bilateral carpal tunnel syndrome 12/31/2018  . Bipolar affective, manic ([place]) 1996  . COPD (chronic obstructive pulmonary disease) ([place])   . Depression   . HLD (hyperlipidemia) 03/12/2018  . Lung nodule, multiple 09/26/2016  . Manic depressive illness ([place]) 1996  . Seizures ([place])   . Substance abuse ([place])   . Tobacco abuse 10/04/2016    Past Surgical History:  Procedure Laterality Date  . ABDOMINAL HYSTERECTOMY    . CARPAL TUNNEL RELEASE Right 01/15/2019   Procedure: RIGHTCARPAL TUNNEL RELEASE, LEFT DEQUERVAINS INJECTION;  Surgeon: Leandrew Koyanagi, MD;  Location: [facility];  Service: Orthopedics;  Laterality: Right;  . CARPAL TUNNEL RELEASE Left 02/26/2019   Procedure: LEFT CARPAL TUNNEL RELEASE;  Surgeon: Leandrew Koyanagi, MD;  Location: [facility];  Service: Orthopedics;  Laterality: Left;  Bier block  . COLONOSCOPY WITH PROPOFOL N/A 11/28/2018   Procedure: COLONOSCOPY WITH PROPOFOL;  Surgeon: [name], MD;  Location: [facility] ENDOSCOPY;  Service:  Gastroenterology;  Laterality: N/A;  . TUBAL LIGATION      Family History  Problem Relation Age of Onset  . Stroke Mother   . Heart attack Father     Social History   Socioeconomic History  . Marital status: Married    Spouse name: Not on file  . Number of children: Not on file  . Years of education: Not on file  . Highest education level: Not on file  Occupational History  . Not on file  Tobacco Use  . Smoking status: Current Every Day Smoker    Packs/day: 1.00    Years: 36.00    Pack years: 36.00    Types: Cigarettes  . Smokeless tobacco: Former Systems developer    Types: Chew, Snuff  . Tobacco comment: Using chew to wean herself off cigarettes  Vaping Use  . Vaping Use: Former  Substance and Sexual Activity  . Alcohol use: No    Comment: "Occasionally"  . Drug use: No    Types: "Crack" cocaine    Comment: history 2013  . Sexual activity: Yes    Birth control/protection: Condom, None  Other Topics Concern  . Not on file  Social History Narrative  . Not on file   Social Determinants of Health   Financial Resource Strain:   . Difficulty of Paying Living Expenses: Not on file  Food Insecurity:   . Worried About Charity fundraiser in the Last Year: Not on file  . Ran Out of Food in the Last Year: Not on file  Transportation Needs:   . Film/video editor (Medical): Not on file  . Lack of Transportation (Non-Medical): Not on file  Physical Activity:   . Days of Exercise per Week: Not on file  . Minutes of Exercise per Session: Not on file  Stress:   . Feeling of Stress : Not on file  Social Connections:   . Frequency of Communication with Friends and Family: Not on file  . Frequency of Social Gatherings with Friends and Family: Not on file  . Attends Religious Services: Not on file  . Active Member of Clubs or Organizations: Not on file  . Attends Archivist Meetings: Not on file  . Marital Status: Not on file  Intimate Partner Violence:   . Fear of  Current or Ex-Partner: Not on file  . Emotionally Abused: Not on file  . Physically Abused: Not on file  . Sexually Abused: Not on file    Outpatient Medications Prior to Visit  Medication Sig Dispense Refill  . aspirin EC 81 MG tablet Take 1 tablet (81 mg total) by mouth daily. No ibuprofen for at least one hour    . citalopram (CELEXA) 20 MG tablet Take 20 mg by mouth daily.    . Fluticasone-Salmeterol (ADVAIR DISKUS) 500-50 MCG/DOSE AEPB Inhale 1 puff into the lungs 2 (two) times daily. 3 each 1  . lamoTRIgine (LAMICTAL) 100 MG tablet Take 100 mg by mouth daily.    . traZODone (DESYREL) 100 MG tablet Take 100 mg by mouth at bedtime. Take 1 - 1.5 tablets by mouth an hour before bedtime    . albuterol (VENTOLIN HFA) 108 (90 Base) MCG/ACT inhaler INHALE 2 PUFFS EVERY 4 TO 6 HOURS AS NEEDED (Patient taking differently: Inhale 2 puffs into the lungs every 4 (four) hours as needed for wheezing or shortness of breath. INHALE 2 PUFFS EVERY 4 TO 6 HOURS AS NEEDED) 54 g 0  . ipratropium-albuterol (DUONEB) 0.5-2.5 (3) MG/3ML SOLN Take 3 mLs by nebulization every 6 (six) hours. (Patient taking differently: Take 3 mLs by nebulization every 6 (six) hours as needed (asthma). ) 360 mL 2  . levETIRAcetam (KEPPRA) 500 MG tablet Take 1 tablet (500 mg total) by mouth 2 (two) times daily. 60 tablet 0  . loratadine (CLARITIN) 10 MG tablet Take 10 mg by mouth daily as needed for allergies.    . metFORMIN (GLUCOPHAGE) 1000 MG tablet Take 0.5 tablets (500 mg total) by mouth 2 (two) times daily with a meal. 60 tablet 3  . rosuvastatin (CRESTOR) 10 MG tablet Take 1 tablet (10 mg total) by mouth daily. 90 tablet 4  . tiotropium (SPIRIVA HANDIHALER) 18 MCG inhalation capsule Place 1 capsule (18 mcg total) into inhaler and inhale daily. 30 capsule 11  . blood glucose meter kit and supplies KIT Dispense based on patient and insurance preference. Use up to four times daily as directed. (FOR ICD-9 250.00, 250.01). (Patient  not taking: Reported on 12/16/2019) 1 each 0  . gabapentin (NEURONTIN) 100 MG capsule Take 1 capsule (100 mg total) by mouth 3 (three) times daily. (Patient not taking: Reported on 12/08/2019) 30 capsule 2  . risperiDONE (RISPERDAL) 2 MG tablet Take 2 mg by mouth daily. (Patient not taking: Reported on 12/16/2019)     No facility-administered medications prior to visit.    Allergies  Allergen Reactions  . Flax Seed Oil [Bio-Flax] Hives    Pt. Self reported    ROS Review of Systems  Constitutional: Negative.  Eyes: Negative.   Respiratory: Negative.   Cardiovascular: Negative.   Endocrine: Negative.   Neurological: Negative.   Psychiatric/Behavioral: Negative.       Objective:    Physical Exam HENT:     Head: Normocephalic.  Eyes:     Extraocular Movements: Extraocular movements intact.     Conjunctiva/sclera: Conjunctivae normal.     Pupils: Pupils are equal, round, and reactive to light.  Cardiovascular:     Rate and Rhythm: Normal rate and regular rhythm.     Pulses: Normal pulses.     Heart sounds: Normal heart sounds.  Pulmonary:     Effort: Pulmonary effort is normal.     Breath sounds: Normal breath sounds.  Skin:    General: Skin is warm.  Neurological:     General: No focal deficit present.     Mental Status: She is alert and oriented to person, place, and time. Mental status is at baseline.  Psychiatric:        Mood and Affect: Mood normal.        Behavior: Behavior normal.        Judgment: Judgment normal.     BP 124/74 (BP Location: Left Arm, Patient Position: Sitting, Cuff Size: Normal)   Pulse 92   Temp 97.6 F (36.4 C)   Resp 16   Wt 181 lb (82.1 kg)   SpO2 95%   BMI 29.21 kg/m  Wt Readings from Last 3 Encounters:  12/16/19 181 lb (82.1 kg)  12/07/19 198 lb 6.6 oz (90 kg)  11/23/19 189 lb (85.7 kg)     Health Maintenance Due  Topic Date Due  . Hepatitis C Screening  Never done  . OPHTHALMOLOGY EXAM  Never done  . COVID-19 Vaccine  (1) Never done  . TETANUS/TDAP  Never done  . PAP SMEAR-Modifier  Never done  . MAMMOGRAM  Never done  . URINE MICROALBUMIN  11/13/2019    There are no preventive care reminders to display for this patient.  Lab Results  Component Value Date   TSH 1.030 05/11/2016   Lab Results  Component Value Date   WBC 9.8 12/07/2019   HGB 12.8 12/07/2019   HCT 40.4 12/07/2019   MCV 93.3 12/07/2019   PLT 279 12/07/2019   Lab Results  Component Value Date   NA 139 12/07/2019   K 3.8 12/07/2019   CO2 24 12/07/2019   GLUCOSE 139 (H) 12/07/2019   BUN 14 12/07/2019   CREATININE 1.17 (H) 12/07/2019   BILITOT 0.7 12/07/2019   ALKPHOS 58 12/07/2019   AST 17 12/07/2019   ALT 17 12/07/2019   PROT 6.5 12/07/2019   ALBUMIN 3.8 12/07/2019   CALCIUM 9.5 12/07/2019   ANIONGAP 11 12/07/2019   Lab Results  Component Value Date   CHOL 119 07/02/2019   Lab Results  Component Value Date   HDL 52 07/02/2019   Lab Results  Component Value Date   LDLCALC 50 07/02/2019   Lab Results  Component Value Date   TRIG 86 07/02/2019   Lab Results  Component Value Date   CHOLHDL 2.3 07/02/2019   Lab Results  Component Value Date   HGBA1C 6.4 (H) 07/02/2019      Assessment & Plan:    1. Pulmonary emphysema, unspecified emphysema type ([place]) -Her COPD is under control and she will continue on current treatment regimen and advised on smoking cessation. - albuterol (VENTOLIN HFA) 108 (90 Base) MCG/ACT inhaler; INHALE 2 PUFFS EVERY 4 TO 6  HOURS AS NEEDED  Dispense: 54 g; Refill: 0 - ipratropium-albuterol (DUONEB) 0.5-2.5 (3) MG/3ML SOLN; Take 3 mLs by nebulization every 6 (six) hours.  Dispense: 360 mL; Refill: 2 - loratadine (CLARITIN) 10 MG tablet; Take 1 tablet (10 mg total) by mouth daily as needed for allergies.  Dispense: 30 tablet; Refill: 3 - tiotropium (SPIRIVA HANDIHALER) 18 MCG inhalation capsule; Place 1 capsule (18 mcg total) into inhaler and inhale daily.  Dispense: 30 capsule;  Refill: 11  2. Type 2 diabetes mellitus without complication, without long-term current use of insulin ([place]) - She will continue on current treatment regimen and will check HgbA1c in 2 weeks. - metFORMIN (GLUCOPHAGE) 1000 MG tablet; Take 0.5 tablets (500 mg total) by mouth 2 (two) times daily with a meal.  Dispense: 60 tablet; Refill: 3 - HgB A1c; Future - Urine Microalbumin w/creat. ratio; Future - UA/M w/rflx Culture, Routine; Future  3. Seizure [facility]) - She will continue on current treatment regimen and will follow up with Neurologist on 12/25/2019. - levETIRAcetam (KEPPRA) 500 MG tablet; Take 1 tablet (500 mg total) by mouth 2 (two) times daily.  Dispense: 60 tablet; Refill: 0 - Levetiracetam level; Future  4. Health care maintenance -She will continue on current treatment regimen, low fat/cholesterol diet. - rosuvastatin (CRESTOR) 10 MG tablet; Take 1 tablet (10 mg total) by mouth daily.  Dispense: 90 tablet; Refill: 4 - UA/M w/rflx Culture, Routine; Future - Flu Vaccine QUAD 6+ mos PF IM (Fluarix Quad PF) was administered. - Ambulatory referral to Hematology / Oncology - Lipid panel; Future  5. Hospital discharge follow-up - She was advised to continue with her discharge instructions.    Follow-up: Return in about 16 days (around 01/01/2020), or if symptoms worsen or fail to improve.    Bret Vanessen Jerold Coombe, NP

## 2019-12-16 NOTE — Patient Instructions (Signed)
Seizure, Adult °A seizure is a sudden burst of abnormal electrical activity in the brain. Seizures usually last from 30 seconds to 2 minutes. They can cause many different symptoms. °Usually, seizures are not harmful unless they last a long time. °What are the causes? °Common causes of this condition include: °· Fever or infection. °· Conditions that affect the brain, such as: °? A brain abnormality that you were born with. °? A brain or head injury. °? Bleeding in the brain. °? A tumor. °? Stroke. °? Brain disorders such as autism or cerebral palsy. °· Low blood sugar. °· Conditions that are passed from parent to child (are inherited). °· Problems with substances, such as: °? Having a reaction to a drug or a medicine. °? Suddenly stopping the use of a substance (withdrawal). °In some cases, the cause may not be known. A person who has repeated seizures over time without a clear cause has a condition called epilepsy. °What increases the risk? °You are more likely to get this condition if you have: °· A family history of epilepsy. °· Had a seizure in the past. °· A brain disorder. °· A history of head injury, lack of oxygen at birth, or strokes. °What are the signs or symptoms? °There are many types of seizures. The symptoms vary depending on the type of seizure you have. Examples of symptoms during a seizure include: °· Shaking (convulsions). °· Stiffness in the body. °· Passing out (losing consciousness). °· Head nodding. °· Staring. °· Not responding to sound or touch. °· Loss of bladder control and bowel control. °Some people have symptoms right before and right after a seizure happens. °Symptoms before a seizure may include: °· Fear. °· Worry (anxiety). °· Feeling like you may vomit (nauseous). °· Feeling like the room is spinning (vertigo). °· Feeling like you saw or heard something before (déjà vu). °· Odd tastes or smells. °· Changes in how you see. You may see flashing lights or spots. °Symptoms after a  seizure happens can include: °· Confusion. °· Sleepiness. °· Headache. °· Weakness on one side of the body. °How is this treated? °Most seizures will stop on their own in under 5 minutes. In these cases, no treatment is needed. Seizures that last longer than 5 minutes will usually need treatment. Treatment can include: °· Medicines given through an IV tube. °· Avoiding things that are known to cause your seizures. These can include medicines that you take for another condition. °· Medicines to treat epilepsy. °· Surgery to stop the seizures. This may be needed if medicines do not help. °Follow these instructions at home: °Medicines °· Take over-the-counter and prescription medicines only as told by your doctor. °· Do not eat or drink anything that may keep your medicine from working, such as alcohol. °Activity °· Do not do any activities that would be dangerous if you had another seizure, like driving or swimming. Wait until your doctor says it is safe for you to do them. °· If you live in the [place] ask your local DMV (department of motor vehicles) when you can drive. °· Get plenty of rest. °Teaching others °Teach friends and family what to do when you have a seizure. They should: °· Lay you on the ground. °· Protect your head and body. °· Loosen any tight clothing around your neck. °· Turn you on your side. °· Not hold you down. °· Not put anything into your mouth. °· Know whether or not you need emergency care. °· Stay   with you until you are better. ° °General instructions °· Contact your doctor each time you have a seizure. °· Avoid anything that gives you seizures. °· Keep a seizure diary. Write down: °? What you think caused each seizure. °? What you remember about each seizure. °· Keep all follow-up visits as told by your doctor. This is important. °Contact a doctor if: °· You have another seizure. °· You have seizures more often. °· There is any change in what happens during your seizures. °· You keep having  seizures with treatment. °· You have symptoms of being sick or having an infection. °Get help right away if: °· You have a seizure that: °? Lasts longer than 5 minutes. °? Is different than seizures you had before. °? Makes it harder to breathe. °? Happens after you hurt your head. °· You have any of these symptoms after a seizure: °? Not being able to speak. °? Not being able to use a part of your body. °? Confusion. °? A bad headache. °· You have two or more seizures in a row. °· You do not wake up right after a seizure. °· You get hurt during a seizure. °These symptoms may be an emergency. Do not wait to see if the symptoms will go away. Get medical help right away. Call your local emergency services (911 in the [place] Do not drive yourself to the hospital. °Summary °· Seizures usually last from 30 seconds to 2 minutes. Usually, they are not harmful unless they last a long time. °· Do not eat or drink anything that may keep your medicine from working, such as alcohol. °· Teach friends and family what to do when you have a seizure. °· Contact your doctor each time you have a seizure. °This information is not intended to replace advice given to you by your health care provider. Make sure you discuss any questions you have with your health care provider. °Document Revised: 04/19/2018 Document Reviewed: 04/19/2018 °Elsevier Patient Education © 2020 [employer] ° °

## 2019-12-24 ENCOUNTER — Other Ambulatory Visit: Payer: Self-pay

## 2019-12-24 ENCOUNTER — Other Ambulatory Visit: Payer: Medicaid Other

## 2019-12-24 DIAGNOSIS — Z0000 Encounter for general adult medical examination without abnormal findings: Secondary | ICD-10-CM

## 2019-12-24 DIAGNOSIS — R569 Unspecified convulsions: Secondary | ICD-10-CM

## 2019-12-24 DIAGNOSIS — E119 Type 2 diabetes mellitus without complications: Secondary | ICD-10-CM

## 2019-12-25 ENCOUNTER — Ambulatory Visit: Payer: Self-pay | Admitting: Neurology

## 2019-12-25 ENCOUNTER — Other Ambulatory Visit: Payer: Self-pay | Admitting: Neurology

## 2019-12-25 ENCOUNTER — Institutional Professional Consult (permissible substitution): Payer: Self-pay | Admitting: Neurology

## 2019-12-25 ENCOUNTER — Encounter: Payer: Self-pay | Admitting: Neurology

## 2019-12-25 VITALS — BP 127/84 | HR 95 | Ht 66.0 in | Wt 179.5 lb

## 2019-12-25 DIAGNOSIS — R569 Unspecified convulsions: Secondary | ICD-10-CM | POA: Insufficient documentation

## 2019-12-25 DIAGNOSIS — G5603 Carpal tunnel syndrome, bilateral upper limbs: Secondary | ICD-10-CM

## 2019-12-25 MED ORDER — LEVETIRACETAM 500 MG PO TABS: 500.0000 mg | ORAL_TABLET | Freq: Two times a day (BID) | ORAL | 4 refills | Status: DC

## 2019-12-25 NOTE — Progress Notes (Signed)
Chief Complaint  Patient presents with  . Consult    Follow up from ED visit on 12/07/19. She is here with her husband, Erica Gomez. Referred for seizures. She was started on Keppra 500mg , one tablet BID. She was already on Lamictal 100mg , one tablet daily for her bipolar disorder. It was increased to BID. No further events since her visit. She has been seen in the past for neck pain and CTS.  Marland Kitchen PCP    Langston Reusing, NP    HISTORICAL  Erica Gomez is a 54 year old female, seen in request by her primary care nurse practitioner Hattie Perch, [place] for evaluation of seizure, she is accompanied by her husband at today's clinical visit.  I reviewed and summarized the referring note.  Past medical history Bipolar disorder, on lamotrigine 100 mg twice a day Diabetes Hyperlipidemia  She reported a history of seizure started age at age 54, generalized tonic-clonic shaking, sometimes with loss of consciousness, the other times has whole body shaking but she is aware of surrounding just could not responding to it.  She had multiple recurrent seizure-like event from age [age], then quit having it.  Had recurrent seizure-like event in 2018  Increase the seizure-like event since September 2021, she reported sudden onset body shaking, oftentimes aware of her body tremorous movement, could not respond to surrounding, without loss of consciousness  She had one major episode on December 02, 2019, it happened while she was alone at home, when her husband came back home, noted she has a knot on her forehead," as big as my fist", also bilateral black eyes, she was washing the blood off her face, patient reported sudden onset loss of consciousness, fell off the chair she was sitting on, face down to the porch  Another episode on December 07, 2019, was witnessed by her coworker, she had a body shaking, could not respond to surrounding, face turned red, lasting for few minutes, but she denied loss of  consciousness  I personally reviewed CT head without contrast, CT of maxillary facial, left frontal scalp hematoma, no acute intracranial abnormality  Extensive laboratory evaluation in 2021,Lipid panel, LDL 70, cholesterol 139, A1c 6.7, CMP showed mild creatinine 1.17, glucose 139, normal CBC, hemoglobin of 12.8, UDS was negative  She has been on lamotrigine 100 mg twice daily for many years for her bipolar disorder, since emergency presentation December 07, 2019, she was added on Keppra 500 mg twice a day, she tolerated the medication well, there was no recurrent episodes since Keppra was added on  I saw her previously in November 2020 for evaluation of painful bilateral hands muscle spasm, she worked at [employer], which require repetitive hand movement, she has intermittent bilateral hands joints pain, paresthesia since 2018,  EMG nerve conduction study on December 31, 2018 confirmed bilateral carpal tunnel syndromes, right side is moderately severe, left side is moderate, demyelinating in nature, no evidence of axonal loss.  She had right carpal tunnel release surgery on January 15, 2019, Left side in Jan 2021, recovered well. She complains of worsening bilateral wrist pain, could no longer lifting heavy object, she has to leave previous job.  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  I personally reviewed MRI of cervical spine December 2020 showed evidence of multilevel degenerative changes, most obvious at C5-6, marked foraminal stenosis, no significant canal stenosis  REVIEW OF SYSTEMS: Full 14 system review of systems performed and notable only for as above All other review  of systems were negative.  ALLERGIES: Allergies  Allergen Reactions  . Flax Seed Oil [Bio-Flax] Hives    Pt. Self reported    HOME MEDICATIONS: Current Outpatient Medications  Medication Sig Dispense Refill  . albuterol (VENTOLIN HFA) 108 (90 Base) MCG/ACT inhaler INHALE 2 PUFFS  EVERY 4 TO 6 HOURS AS NEEDED 54 g 0  . aspirin EC 81 MG tablet Take 1 tablet (81 mg total) by mouth daily. No ibuprofen for at least one hour    . citalopram (CELEXA) 20 MG tablet Take 20 mg by mouth daily.    . Fluticasone-Salmeterol (ADVAIR DISKUS) 500-50 MCG/DOSE AEPB Inhale 1 puff into the lungs 2 (two) times daily. 3 each 1  . ipratropium-albuterol (DUONEB) 0.5-2.5 (3) MG/3ML SOLN Take 3 mLs by nebulization every 6 (six) hours. 360 mL 2  . lamoTRIgine (LAMICTAL) 100 MG tablet Take 100 mg by mouth 2 (two) times daily.     Marland Kitchen levETIRAcetam (KEPPRA) 500 MG tablet Take 1 tablet (500 mg total) by mouth 2 (two) times daily. 60 tablet 0  . metFORMIN (GLUCOPHAGE) 1000 MG tablet Take 0.5 tablets (500 mg total) by mouth 2 (two) times daily with a meal. 60 tablet 3  . risperiDONE (RISPERDAL) 2 MG tablet Take 2 mg by mouth daily.     . rosuvastatin (CRESTOR) 10 MG tablet Take 1 tablet (10 mg total) by mouth daily. 90 tablet 4  . tiotropium (SPIRIVA HANDIHALER) 18 MCG inhalation capsule Place 1 capsule (18 mcg total) into inhaler and inhale daily. 30 capsule 11  . traZODone (DESYREL) 100 MG tablet Take 100 mg by mouth at bedtime. Take 1 - 1.5 tablets by mouth an hour before bedtime     No current facility-administered medications for this visit.    PAST MEDICAL HISTORY: Past Medical History:  Diagnosis Date  . Allergy   . Anxiety   . Asthma   . Bilateral carpal tunnel syndrome 12/31/2018  . Bipolar affective, manic ([place]) 1996  . COPD (chronic obstructive pulmonary disease) ([place])   . Depression   . HLD (hyperlipidemia) 03/12/2018  . Lung nodule, multiple 09/26/2016  . Manic depressive illness ([place]) 1996  . Seizures ([place])   . Substance abuse ([place])   . Tobacco abuse 10/04/2016    PAST SURGICAL HISTORY: Past Surgical History:  Procedure Laterality Date  . ABDOMINAL HYSTERECTOMY    . CARPAL TUNNEL RELEASE Right 01/15/2019   Procedure: RIGHTCARPAL TUNNEL RELEASE, LEFT DEQUERVAINS INJECTION;   Surgeon: Leandrew Koyanagi, MD;  Location: [facility];  Service: Orthopedics;  Laterality: Right;  . CARPAL TUNNEL RELEASE Left 02/26/2019   Procedure: LEFT CARPAL TUNNEL RELEASE;  Surgeon: Leandrew Koyanagi, MD;  Location: [facility];  Service: Orthopedics;  Laterality: Left;  Bier block  . COLONOSCOPY WITH PROPOFOL N/A 11/28/2018   Procedure: COLONOSCOPY WITH PROPOFOL;  Surgeon: [name], MD;  Location: [facility] ENDOSCOPY;  Service: Gastroenterology;  Laterality: N/A;  . TUBAL LIGATION      FAMILY HISTORY: Family History  Problem Relation Age of Onset  . Stroke Mother   . Heart attack Father     SOCIAL HISTORY: Social History   Socioeconomic History  . Marital status: Married    Spouse name: Not on file  . Number of children: 2  . Years of education: taking some medical billing classes now  . Highest education level: Not on file  Occupational History  . Occupation: Caretaker  Tobacco Use  . Smoking status: Current Every Day Smoker  Packs/day: 1.00    Years: 36.00    Pack years: 36.00    Types: Cigarettes  . Smokeless tobacco: Former Systems developer    Types: Chew, Snuff  . Tobacco comment: Using chew to wean herself off cigarettes  Vaping Use  . Vaping Use: Former  Substance and Sexual Activity  . Alcohol use: No  . Drug use: No    Types: "Crack" cocaine    Comment: history 2013  . Sexual activity: Yes    Birth control/protection: Condom, None  Other Topics Concern  . Not on file  Social History Narrative   Lives with her husband.   Right-handed.   Caffeine use: 2 cups daily.   Social Determinants of Health   Financial Resource Strain:   . Difficulty of Paying Living Expenses: Not on file  Food Insecurity:   . Worried About Charity fundraiser in the Last Year: Not on file  . Ran Out of Food in the Last Year: Not on file  Transportation Needs:   . Lack of Transportation (Medical): Not on file  . Lack of Transportation (Non-Medical): Not on file   Physical Activity:   . Days of Exercise per Week: Not on file  . Minutes of Exercise per Session: Not on file  Stress:   . Feeling of Stress : Not on file  Social Connections:   . Frequency of Communication with Friends and Family: Not on file  . Frequency of Social Gatherings with Friends and Family: Not on file  . Attends Religious Services: Not on file  . Active Member of Clubs or Organizations: Not on file  . Attends Archivist Meetings: Not on file  . Marital Status: Not on file  Intimate Partner Violence:   . Fear of Current or Ex-Partner: Not on file  . Emotionally Abused: Not on file  . Physically Abused: Not on file  . Sexually Abused: Not on file     PHYSICAL EXAM   Vitals:   12/25/19 0859  BP: 127/84  Pulse: 95  Weight: 179 lb 8 oz (81.4 kg)  Height: 5\' 6"  (1.676 m)   Not recorded     Body mass index is 28.97 kg/m.  PHYSICAL EXAMNIATION:  Gen: NAD, conversant, well nourised, well groomed                     Cardiovascular: Regular rate rhythm, no peripheral edema, warm, nontender. Eyes: Conjunctivae clear without exudates or hemorrhage Neck: Supple, no carotid bruits. Pulmonary: Clear to auscultation bilaterally   NEUROLOGICAL EXAM:  MENTAL STATUS: Speech:    Speech is normal; fluent and spontaneous with normal comprehension.  Cognition:     Orientation to time, place and person     Normal recent and remote memory     Normal Attention span and concentration     Normal Language, naming, repeating,spontaneous speech     Fund of knowledge   CRANIAL NERVES: CN II: Visual fields are full to confrontation. Pupils are round equal and briskly reactive to light. CN III, IV, VI: extraocular movement are normal. No ptosis. CN V: Facial sensation is intact to light touch CN VII: Face is symmetric with normal eye closure  CN VIII: Hearing is normal to causal conversation. CN IX, X: Phonation is normal. CN XI: Head turning and shoulder shrug are  intact  MOTOR: Right fourth and fifth finger atrophy due to childhood injury, difficulty with fourth and fifth finger flexion, rest of the motor examination showed  no significant abnormality.  REFLEXES: Reflexes are 2+ and symmetric at the biceps, triceps, knees, and ankles. Plantar responses are flexor.  SENSORY: Intact to light touch, pinprick and vibratory sensation are intact in fingers and toes.  COORDINATION: There is no trunk or limb dysmetria noted.  GAIT/STANCE: Posture is normal. Gait is steady with normal steps, base, arm swing, and turning. Heel and toe walking are normal. Tandem gait is normal.  Romberg is absent.   DIAGNOSTIC DATA (LABS, IMAGING, TESTING) - I reviewed patient records, labs, notes, testing and imaging myself where available.   ASSESSMENT AND PLAN  MONIGUE SPRAGGINS is a 54 y.o. female   Seizure-like event  Complete evaluation with MRI of the brain with without contrast  EEG  Continue lamotrigine 100 mg twice daily, Keppra 500 mg twice daily  Check the level  No driving until episode free for 6 months  History of bilateral carpal tunnel syndrome, status post decompression, with continued bilateral hands paresthesia, wrist pain   Marcial Pacas, M.D. Ph.D.  [facility] [address] [place], [place] [address] Ph: [phone] Fax: [phone]  CC:  Antonietta Breach, [place],  [place] [address]

## 2019-12-27 LAB — LAMOTRIGINE LEVEL: Lamotrigine Lvl: 11.4 ug/mL (ref 2.0–20.0)

## 2019-12-28 LAB — UA/M W/RFLX CULTURE, ROUTINE
Bilirubin, UA: NEGATIVE
Glucose, UA: NEGATIVE
Ketones, UA: NEGATIVE
Leukocytes,UA: NEGATIVE
Nitrite, UA: POSITIVE — AB
Protein,UA: NEGATIVE
RBC, UA: NEGATIVE
Specific Gravity, UA: 1.019 (ref 1.005–1.030)
Urobilinogen, Ur: 0.2 mg/dL (ref 0.2–1.0)
pH, UA: 5.5 (ref 5.0–7.5)

## 2019-12-28 LAB — HEMOGLOBIN A1C
Est. average glucose Bld gHb Est-mCnc: 146 mg/dL
Hgb A1c MFr Bld: 6.7 % — ABNORMAL HIGH (ref 4.8–5.6)

## 2019-12-28 LAB — URINE CULTURE, REFLEX

## 2019-12-28 LAB — LIPID PANEL
Chol/HDL Ratio: 2.9 ratio (ref 0.0–4.4)
Cholesterol, Total: 139 mg/dL (ref 100–199)
HDL: 48 mg/dL (ref >39)
LDL Chol Calc (NIH): 70 mg/dL (ref 0–99)
Triglycerides: 114 mg/dL (ref 0–149)
VLDL Cholesterol Cal: 21 mg/dL (ref 5–40)

## 2019-12-28 LAB — MICROSCOPIC EXAMINATION: Casts: NONE SEEN /lpf

## 2019-12-28 LAB — MICROALBUMIN / CREATININE URINE RATIO
Creatinine, Urine: 129.3 mg/dL
Microalb/Creat Ratio: 13 mg/g creat (ref 0–29)
Microalbumin, Urine: 16.2 ug/mL

## 2019-12-28 LAB — LEVETIRACETAM LEVEL: Levetiracetam Lvl: 14.4 ug/mL (ref 10.0–40.0)

## 2019-12-29 ENCOUNTER — Telehealth: Payer: Self-pay | Admitting: Neurology

## 2019-12-29 NOTE — Telephone Encounter (Signed)
self pay order sent to [facility]. They will reach out to the patient to schedule.  °

## 2019-12-31 ENCOUNTER — Other Ambulatory Visit: Payer: Medicaid Other

## 2020-01-01 ENCOUNTER — Ambulatory Visit: Payer: Self-pay | Admitting: Gerontology

## 2020-01-01 ENCOUNTER — Telehealth: Payer: Self-pay | Admitting: Gerontology

## 2020-01-01 NOTE — Telephone Encounter (Signed)
-----   Message from Langston Reusing, NP sent at 01/01/2020  3:38 PM EST ----- Ms. Cruzen no showed to her appointment, pls reschedule her and make a telephone note. Thank you

## 2020-01-02 ENCOUNTER — Ambulatory Visit: Payer: Medicaid Other | Admitting: Pharmacy Technician

## 2020-01-02 ENCOUNTER — Other Ambulatory Visit: Payer: Self-pay

## 2020-01-02 DIAGNOSIS — Z79899 Other long term (current) drug therapy: Secondary | ICD-10-CM

## 2020-01-02 NOTE — Progress Notes (Signed)
Completed financial assistance application for [facility] due to recent hospital visit. Forwarded financial information to appropriate department in [facility].    Completed [facility] application and contract.  Patient agreed to all terms of the [facility] contract.    Patient approved to receive medication assistance at [facility] until time for re-certifictaion in 1222, and as long as eligibility criteria continues to be met.    Provided patient with community resource material based on her particular needs.    [facility]

## 2020-01-06 ENCOUNTER — Other Ambulatory Visit: Payer: Self-pay | Admitting: Gerontology

## 2020-01-07 ENCOUNTER — Ambulatory Visit: Payer: Self-pay | Admitting: Neurology

## 2020-01-07 ENCOUNTER — Other Ambulatory Visit: Payer: Self-pay | Admitting: *Deleted

## 2020-01-07 DIAGNOSIS — G5603 Carpal tunnel syndrome, bilateral upper limbs: Secondary | ICD-10-CM

## 2020-01-07 DIAGNOSIS — R569 Unspecified convulsions: Secondary | ICD-10-CM

## 2020-01-07 MED ORDER — LAMOTRIGINE 100 MG PO TABS
100.0000 mg | ORAL_TABLET | Freq: Two times a day (BID) | ORAL | 3 refills | Status: DC
Start: 2020-01-07 — End: 2020-06-23

## 2020-01-13 ENCOUNTER — Other Ambulatory Visit: Payer: Self-pay

## 2020-01-13 ENCOUNTER — Telehealth: Payer: Self-pay | Admitting: Gerontology

## 2020-01-14 ENCOUNTER — Telehealth: Payer: Self-pay | Admitting: Pharmacist

## 2020-01-14 NOTE — Telephone Encounter (Signed)
01/14/2020 2:53:49 PM - ProAir faxed to [facility] - Wednesday, January 14, 2020 2:52 PM --Faxed [employer] application for ProAir HFA 0.09mg  Inhale 2 puffs every four to six hours as needed # 4. (replaces Ventolin).

## 2020-01-15 ENCOUNTER — Other Ambulatory Visit: Payer: Medicaid Other

## 2020-01-15 NOTE — Telephone Encounter (Signed)
Unable to LVM @1 :15 pm on 01/15/20-KW

## 2020-01-20 ENCOUNTER — Other Ambulatory Visit: Payer: Self-pay

## 2020-01-20 ENCOUNTER — Ambulatory Visit
Admission: RE | Admit: 2020-01-20 | Discharge: 2020-01-20 | Disposition: A | Discharge status: Home / Self Care | Payer: Self-pay | Source: Ambulatory Visit | Attending: Oncology | Admitting: Oncology

## 2020-01-20 ENCOUNTER — Encounter: Payer: Self-pay | Admitting: *Deleted

## 2020-01-20 ENCOUNTER — Ambulatory Visit: Payer: Self-pay | Attending: Oncology | Admitting: *Deleted

## 2020-01-20 VITALS — BP 128/87 | HR 102 | Temp 97.9°F | Ht 66.0 in | Wt 173.0 lb

## 2020-01-20 DIAGNOSIS — Z0000 Encounter for general adult medical examination without abnormal findings: Secondary | ICD-10-CM | POA: Insufficient documentation

## 2020-01-20 NOTE — Patient Instructions (Signed)
Gave patient hand-out, Women Staying Healthy, Active and Well from [facility], with education on breast health, pap smears, heart and colon health. 

## 2020-01-20 NOTE — Progress Notes (Signed)
Subjective:     Patient ID: Erica Gomez, female   DOB: [birth year], 54 y.o.   MRN: [id number]  HPI   [facility] Medical History Record - 01/20/20 1108      Breast History   Screening cycle New    Provider ([facility]) [facility]    Initial Mammogram 01/20/20    Last Mammogram Never    Recent Breast Symptoms None      Breast Cancer History   Breast Cancer History No personal or family history      Previous History of Breast Problems   Breast Surgery or Biopsy None    Breast Implants N/A    BSE Done Never      Gynecological/Obstetrical History   LMP --   30's hysterectomy for bleeding   Is there any chance that the client could be pregnant?  No    Age at menarche 9    Age at menopause 50's    Age at first live birth 56    Breast fed children No    DES Exposure No    Cervical, Uterine or Ovarian cancer No    Family history of Cervial, Uterine or Ovarian cancer No    Hysterectomy Yes    Cervix removed Yes    Ovaries removed No    Laser/Cryosurgery No    Current method of birth control --   tubal ligation   Current method of Estrogen/Hormone replacement None    Smoking history Yes   3-4 cigarettes per day.           Review of Systems     Objective:   Physical Exam Chest:     Breasts:        Right: No swelling, bleeding, inverted nipple, mass, nipple discharge, skin change or tenderness.        Left: No swelling, bleeding, inverted nipple, mass, nipple discharge, skin change or tenderness.    Abdominal:     Palpations: There is no hepatomegaly or splenomegaly.     Hernia: There is no hernia in the left inguinal area or right inguinal area.    Genitourinary:    Exam position: Lithotomy position.     Labia:        Right: No rash, tenderness, lesion or injury.        Left: No rash, tenderness, lesion or injury.      Urethra: No prolapse, urethral pain, urethral swelling or urethral lesion.     Vagina: No signs of injury and foreign body. No vaginal discharge,  erythema, tenderness, bleeding or lesions.     Comments: Patient with a history of hysterectomy - no cervix noted on exam Lymphadenopathy:     Upper Body:     Right upper body: No supraclavicular or axillary adenopathy.     Left upper body: No supraclavicular or axillary adenopathy.     Lower Body: No right inguinal adenopathy. No left inguinal adenopathy.        Assessment:     54 year old Black female referred to [place] by the [facility] for clinical breast exam and mammogram.  On clinical breast exam there is no palpable mass, nipple discharge, skin changes or lymphadenopathy.  Taught self breast awareness.  Patient with a history of a hysterectomy, but was not sure if she had a cervix.  On pelvic exam there is no cervix present.  Pap omitted per protocol.  Patient has been screened for eligibility.  She does not have any  insurance, Medicare or Medicaid.  She also meets financial eligibility.   Risk Assessment    Risk Scores      01/20/2020   Last edited by: Theodore Demark, RN   5-year risk: 1.3 %   Lifetime risk: 8 %            Plan:     Screening mammogram ordered.  Will follow up per [facility] protocol.

## 2020-01-22 NOTE — Procedures (Signed)
   HISTORY: 54 year old female presented with seizure-like activity  TECHNIQUE:  This is a routine 16 channel EEG recording with one channel devoted to a limited EKG recording.  It was performed during wakefulness, drowsiness and asleep.  Hyperventilation and photic stimulation were performed as activating procedures.  There are minimum muscle and movement artifact noted.  Upon maximum arousal, posterior dominant waking rhythm consistent of rhythmic alpha range activity, with frequency of 12 hz. Activities are symmetric over the bilateral posterior derivations and attenuated with eye opening.  Hyperventilation produced mild/moderate buildup with higher amplitude and the slower activities noted.  Photic stimulation did not alter the tracing.  During EEG recording, patient developed drowsiness and no deeper stage of sleep was achieved  During EEG recording, there was no epileptiform discharge noted.  EKG demonstrate sinus rhythm, with heart rate of 84 bpm  CONCLUSION: This is a  normal awake EEG.  There is no electrodiagnostic evidence of epileptiform discharge.  Marcial Pacas, M.D. Ph.D.  [facility] [place], [place] [address] Phone: [phone] Fax:      [phone]

## 2020-01-23 ENCOUNTER — Other Ambulatory Visit: Payer: Self-pay | Admitting: *Deleted

## 2020-01-23 DIAGNOSIS — N63 Unspecified lump in unspecified breast: Secondary | ICD-10-CM

## 2020-01-27 ENCOUNTER — Telehealth: Payer: Self-pay | Admitting: Neurology

## 2020-01-27 NOTE — Telephone Encounter (Signed)
CONCLUSION: This is a  normal awake EEG.  There is no electrodiagnostic evidence of epileptiform discharge. Marcial Pacas, M.D. Ph.D.   I provided these results to the patient's husband on DPR Lang Snow). He understands the patient should complete her MRI brain, continue her medication, refrain from driving until six months event-free and keep her follow up appointment.

## 2020-01-27 NOTE — Telephone Encounter (Signed)
Erica Gomez,Erica Gomez(husband on DPR) is asking for a call with the results to the EEG pt had weeks ago.

## 2020-01-28 ENCOUNTER — Other Ambulatory Visit: Payer: Self-pay

## 2020-01-28 ENCOUNTER — Ambulatory Visit: Payer: Medicaid Other | Admitting: Adult Health

## 2020-01-28 NOTE — Telephone Encounter (Signed)
Noted, it was [facility] that reach out to the patient to schedule the MRI. I will let them know to reach out the patient husband.

## 2020-01-30 NOTE — Telephone Encounter (Signed)
Error

## 2020-02-10 ENCOUNTER — Ambulatory Visit: Payer: Medicaid Other | Admitting: Gerontology

## 2020-02-10 ENCOUNTER — Other Ambulatory Visit: Payer: Self-pay

## 2020-02-10 VITALS — BP 119/71 | HR 107 | Wt 178.6 lb

## 2020-02-10 DIAGNOSIS — R569 Unspecified convulsions: Secondary | ICD-10-CM

## 2020-02-10 DIAGNOSIS — E119 Type 2 diabetes mellitus without complications: Secondary | ICD-10-CM

## 2020-02-10 NOTE — Progress Notes (Signed)
[facility]   Progress Note: General Provider: Regino Bellow, NP  SUBJECTIVE:   Erica Gomez is a 54 y.o. female who  has a past medical history of Allergy, Anxiety, Asthma, Bilateral carpal tunnel syndrome (12/31/2018), Bipolar affective, manic (HCC) (1996), COPD (chronic obstructive pulmonary disease) (HCC), Depression, HLD (hyperlipidemia) (03/12/2018), Lung nodule, multiple (09/26/2016), Manic depressive illness (HCC) (1996), Seizures (HCC), Substance abuse (HCC), and Tobacco abuse (10/04/2016).. Patient presents today for follow-up lab review and side effect of seizure medication. She reports hand tremors and generalized body jerking two weeks ago after lamotrigine increased to 100 mg twice a day. Although she endorses her symptoms resolved after she stopped taking lamotrigine, she restarted taking discontinued medication- risperidone 2 mg daily one week ago. She reports that she took risperidone for many years and was switched to lamotrigine by the [facility] provider. She informed [facility] staff but didn't make an appointment with the provider. She plans to contact her neurology Dr. Levert Feinstein. She endorses having a seizure activity two days ago while on the phone with her friend, and she passed out and fell on her left side. She was unconscious; however, her friend, who was on the phone, informed her that her seizure didn't last more than a minute. She states that since she didn't have side effects while on lamotrigine 100mg  daily, and her seizure was well controlled, she will stop risperidone and resume Lamotrigine 100 mg daily. Dr neurology EEG report on 01/07/20 indicates that there is no electrodiagnostic evidence of epileptiform discharge, normal awake EEG. She states that she doesn't check her blood glucose at home because she doesn't know how to use the glucometer. Glucose at the clinic was 105 mg/dl. She follows a low carb/a low concentrated sweets diet and exercises as  tolerated. She denies hypo/hyperglycemia symptoms or peripheral neuropathy. Urine culture that was done on 12/24/19 had Escherichia coli, positive nitrite. She denies costovertebral angle (CVA) tenderness, dysuria or hematuria, however she endorses passing frequent, small amounts of urine for the past 2 years. She has no other complaints. Overall, she states that she's doing well and offers no further complaints. Review of Systems  Constitutional: Negative.   HENT: Negative.   Eyes: Negative.   Respiratory: Negative.   Cardiovascular: Negative.   Gastrointestinal: Negative.   Genitourinary: Negative.   Musculoskeletal: Negative.   Skin: Negative.   Neurological: Negative.   Endo/Heme/Allergies: Negative.   Psychiatric/Behavioral: Negative.      OBJECTIVE: BP 119/71 (BP Location: Left Arm, Patient Position: Sitting)   Pulse (!) 107   Wt 178 lb 9.6 oz (81 kg)   SpO2 93%   BMI 28.83 kg/m   Wt Readings from Last 3 Encounters:  02/10/20 178 lb 9.6 oz (81 kg)  01/20/20 173 lb (78.5 kg)  12/25/19 179 lb 8 oz (81.4 kg)     Physical Exam HENT:     Head: Normocephalic.  Cardiovascular:     Rate and Rhythm: Normal rate and regular rhythm.  Pulmonary:     Effort: Pulmonary effort is normal.     Breath sounds: Normal breath sounds.  Abdominal:     General: Bowel sounds are normal.     Palpations: Abdomen is soft.  Musculoskeletal:        General: Normal range of motion.     Cervical back: Normal range of motion.  Skin:    General: Skin is warm and dry.  Neurological:     General: No focal deficit present.  Mental Status: She is alert and oriented to person, place, and time.  Psychiatric:        Mood and Affect: Mood normal.        Behavior: Behavior normal.     ASSESSMENT/PLAN:  1. Type 2 diabetes mellitus without complication, without long-term current use of insulin (HCC) -No medication changes warranted at the present time -Last Hgb A1c was 6.7% which is at  goal. -Continue Metformin 500 mg twice a day -Educated the use of glucometer and advised to check fasting blood glucose 3-4 times a week and bring blood glucose log to the next appointment. Fasting blood glucose should be between 80 mg/dl to 130mg /dl -Continue on low carb/a low concentrated sweets diet and exercise as tolerated. She was advised to do daily foot checks - Urine Culture - HgB A1c  2. Seizures ([place]) -Follow up with neurology Dr. Marcial Pacas regarding seizure medication adjustment. -Make sure to contact [facility] health service regarding change of lamotrigine dose. - Wear a medical ID bracelet or necklace, or carry a medical ID card. -Avoid stress and get enough sleep 3. Abnormal urine culture -We will repeat the urine culture since the result was over several weeks ago; furthermore, you denied having urinary tract infection symptoms. -Return to the clinic on 02/11/20 for urine culture sample- Advised to do Kegel exercises three to four times each day as tolerated to improve urinary incontinence. Return in about 3 months (around 05/10/2020), or if symptoms worsen or fail to improve, for diabetes .    The patient was given clear instructions to go to ER or return to medical center if symptoms do not improve, worsen or new problems develop. The patient verbalized understanding and agreed with plan of care.  Erica Gomez, [place]

## 2020-02-10 NOTE — Patient Instructions (Signed)
Seizure, Adult °A seizure is a sudden burst of abnormal electrical activity in the brain. Seizures usually last from 30 seconds to 2 minutes. They can cause many different symptoms. °Usually, seizures are not harmful unless they last a long time. °What are the causes? °Common causes of this condition include: °· Fever or infection. °· Conditions that affect the brain, such as: °? A brain abnormality that you were born with. °? A brain or head injury. °? Bleeding in the brain. °? A tumor. °? Stroke. °? Brain disorders such as autism or cerebral palsy. °· Low blood sugar. °· Conditions that are passed from parent to child (are inherited). °· Problems with substances, such as: °? Having a reaction to a drug or a medicine. °? Suddenly stopping the use of a substance (withdrawal). °In some cases, the cause may not be known. A person who has repeated seizures over time without a clear cause has a condition called epilepsy. °What increases the risk? °You are more likely to get this condition if you have: °· A family history of epilepsy. °· Had a seizure in the past. °· A brain disorder. °· A history of head injury, lack of oxygen at birth, or strokes. °What are the signs or symptoms? °There are many types of seizures. The symptoms vary depending on the type of seizure you have. Examples of symptoms during a seizure include: °· Shaking (convulsions). °· Stiffness in the body. °· Passing out (losing consciousness). °· Head nodding. °· Staring. °· Not responding to sound or touch. °· Loss of bladder control and bowel control. °Some people have symptoms right before and right after a seizure happens. °Symptoms before a seizure may include: °· Fear. °· Worry (anxiety). °· Feeling like you may vomit (nauseous). °· Feeling like the room is spinning (vertigo). °· Feeling like you saw or heard something before (déjà vu). °· Odd tastes or smells. °· Changes in how you see. You may see flashing lights or spots. °Symptoms after a  seizure happens can include: °· Confusion. °· Sleepiness. °· Headache. °· Weakness on one side of the body. °How is this treated? °Most seizures will stop on their own in under 5 minutes. In these cases, no treatment is needed. Seizures that last longer than 5 minutes will usually need treatment. Treatment can include: °· Medicines given through an IV tube. °· Avoiding things that are known to cause your seizures. These can include medicines that you take for another condition. °· Medicines to treat epilepsy. °· Surgery to stop the seizures. This may be needed if medicines do not help. °Follow these instructions at home: °Medicines °· Take over-the-counter and prescription medicines only as told by your doctor. °· Do not eat or drink anything that may keep your medicine from working, such as alcohol. °Activity °· Do not do any activities that would be dangerous if you had another seizure, like driving or swimming. Wait until your doctor says it is safe for you to do them. °· If you live in the [place] ask your local DMV (department of motor vehicles) when you can drive. °· Get plenty of rest. °Teaching others °Teach friends and family what to do when you have a seizure. They should: °· Lay you on the ground. °· Protect your head and body. °· Loosen any tight clothing around your neck. °· Turn you on your side. °· Not hold you down. °· Not put anything into your mouth. °· Know whether or not you need emergency care. °· Stay   with you until you are better.  General instructions  Contact your doctor each time you have a seizure.  Avoid anything that gives you seizures.  Keep a seizure diary. Write down: ? What you think caused each seizure. ? What you remember about each seizure.  Keep all follow-up visits as told by your doctor. This is important. Contact a doctor if:  You have another seizure.  You have seizures more often.  There is any change in what happens during your seizures.  You keep having  seizures with treatment.  You have symptoms of being sick or having an infection. Get help right away if:  You have a seizure that: ? Lasts longer than 5 minutes. ? Is different than seizures you had before. ? Makes it harder to breathe. ? Happens after you hurt your head.  You have any of these symptoms after a seizure: ? Not being able to speak. ? Not being able to use a part of your body. ? Confusion. ? A bad headache.  You have two or more seizures in a row.  You do not wake up right after a seizure.  You get hurt during a seizure. These symptoms may be an emergency. Do not wait to see if the symptoms will go away. Get medical help right away. Call your local emergency services (911 in the [place] Do not drive yourself to the hospital. Summary  Seizures usually last from 30 seconds to 2 minutes. Usually, they are not harmful unless they last a long time.  Do not eat or drink anything that may keep your medicine from working, such as alcohol.  Teach friends and family what to do when you have a seizure.  Contact your doctor each time you have a seizure. This information is not intended to replace advice given to you by your health care provider. Make sure you discuss any questions you have with your health care provider. Document Revised: 04/19/2018 Document Reviewed: 04/19/2018 Elsevier Patient Education  2020 [employer] Diabetes Basics  Diabetes (diabetes mellitus) is a long-term (chronic) disease. It occurs when the body does not properly use sugar (glucose) that is released from food after you eat. Diabetes may be caused by one or both of these problems:  Your pancreas does not make enough of a hormone called insulin.  Your body does not react in a normal way to insulin that it makes. Insulin lets sugars (glucose) go into cells in your body. This gives you energy. If you have diabetes, sugars cannot get into cells. This causes high blood sugar  (hyperglycemia). Follow these instructions at home: How is diabetes treated? You may need to take insulin or other diabetes medicines daily to keep your blood sugar in balance. Take your diabetes medicines every day as told by your doctor. List your diabetes medicines here: Diabetes medicines  Name of medicine: ______________________________ ? Amount (dose): _______________ Time (a.m./p.m.): _______________ Notes: ___________________________________  Name of medicine: ______________________________ ? Amount (dose): _______________ Time (a.m./p.m.): _______________ Notes: ___________________________________  Name of medicine: ______________________________ ? Amount (dose): _______________ Time (a.m./p.m.): _______________ Notes: ___________________________________ If you use insulin, you will learn how to give yourself insulin by injection. You may need to adjust the amount based on the food that you eat. List the types of insulin you use here: Insulin  Insulin type: ______________________________ ? Amount (dose): _______________ Time (a.m./p.m.): _______________ Notes: ___________________________________  Insulin type: ______________________________ ? Amount (dose): _______________ Time (a.m./p.m.): _______________ Notes: ___________________________________  Insulin type: ______________________________ ? Amount (dose):  _______________ Time (a.m./p.m.): _______________ Notes: ___________________________________  Insulin type: ______________________________ ? Amount (dose): _______________ Time (a.m./p.m.): _______________ Notes: ___________________________________  Insulin type: ______________________________ ? Amount (dose): _______________ Time (a.m./p.m.): _______________ Notes: ___________________________________ How do I manage my blood sugar?  Check your blood sugar levels using a blood glucose monitor as directed by your doctor. Your doctor will set treatment goals for you.  Generally, you should have these blood sugar levels:  Before meals (preprandial): 80-130 mg/dL (4.4-7.2 mmol/L).  After meals (postprandial): below 180 mg/dL (10 mmol/L).  A1c level: less than 7%. Write down the times that you will check your blood sugar levels: Blood sugar checks  Time: _______________ Notes: ___________________________________  Time: _______________ Notes: ___________________________________  Time: _______________ Notes: ___________________________________  Time: _______________ Notes: ___________________________________  Time: _______________ Notes: ___________________________________  Time: _______________ Notes: ___________________________________  What do I need to know about low blood sugar? Low blood sugar is called hypoglycemia. This is when blood sugar is at or below 70 mg/dL (3.9 mmol/L). Symptoms may include:  Feeling: ? Hungry. ? Worried or nervous (anxious). ? Sweaty and clammy. ? Confused. ? Dizzy. ? Sleepy. ? Sick to your stomach (nauseous).  Having: ? A fast heartbeat. ? A headache. ? A change in your vision. ? Tingling or no feeling (numbness) around the mouth, lips, or tongue. ? Jerky movements that you cannot control (seizure).  Having trouble with: ? Moving (coordination). ? Sleeping. ? Passing out (fainting). ? Getting upset easily (irritability). Treating low blood sugar To treat low blood sugar, eat or drink something sugary right away. If you can think clearly and swallow safely, follow the 15:15 rule:  Take 15 grams of a fast-acting carb (carbohydrate). Talk with your doctor about how much you should take.  Some fast-acting carbs are: ? Sugar tablets (glucose pills). Take 3-4 glucose pills. ? 6-8 pieces of hard candy. ? 4-6 oz (120-150 mL) of fruit juice. ? 4-6 oz (120-150 mL) of regular (not diet) soda. ? 1 Tbsp (15 mL) honey or sugar.  Check your blood sugar 15 minutes after you take the carb.  If your blood  sugar is still at or below 70 mg/dL (3.9 mmol/L), take 15 grams of a carb again.  If your blood sugar does not go above 70 mg/dL (3.9 mmol/L) after 3 tries, get help right away.  After your blood sugar goes back to normal, eat a meal or a snack within 1 hour. Treating very low blood sugar If your blood sugar is at or below 54 mg/dL (3 mmol/L), you have very low blood sugar (severe hypoglycemia). This is an emergency. Do not wait to see if the symptoms will go away. Get medical help right away. Call your local emergency services (911 in the [place] Do not drive yourself to the hospital. Questions to ask your health care provider  Do I need to meet with a diabetes educator?  What equipment will I need to care for myself at home?  What diabetes medicines do I need? When should I take them?  How often do I need to check my blood sugar?  What number can I call if I have questions?  When is my next doctor's visit?  Where can I find a support group for people with diabetes? Where to find more information  American Diabetes Association: www.diabetes.org  American Association of Diabetes Educators: www.diabeteseducator.org/patient-resources Contact a doctor if:  Your blood sugar is at or above 240 mg/dL (13.3 mmol/L) for 2 days in a row.  You have  been sick or have had a fever for 2 days or more, and you are not getting better.  You have any of these problems for more than 6 hours: ? You cannot eat or drink. ? You feel sick to your stomach (nauseous). ? You throw up (vomit). ? You have watery poop (diarrhea). Get help right away if:  Your blood sugar is lower than 54 mg/dL (3 mmol/L).  You get confused.  You have trouble: ? Thinking clearly. ? Breathing. Summary  Diabetes (diabetes mellitus) is a long-term (chronic) disease. It occurs when the body does not properly use sugar (glucose) that is released from food after digestion.  Take insulin and diabetes medicines as  told.  Check your blood sugar every day, as often as told.  Keep all follow-up visits as told by your doctor. This is important. This information is not intended to replace advice given to you by your health care provider. Make sure you discuss any questions you have with your health care provider. Document Revised: 10/23/2018 Document Reviewed: 05/04/2017 Elsevier Patient Education  2020 [employer].

## 2020-02-16 ENCOUNTER — Telehealth: Payer: Self-pay | Admitting: *Deleted

## 2020-02-16 NOTE — Telephone Encounter (Signed)
No response to calls / letters to schedule AV / mammo - sending certified letter

## 2020-02-20 ENCOUNTER — Ambulatory Visit
Admission: RE | Admit: 2020-02-20 | Discharge: 2020-02-20 | Disposition: A | Discharge status: Home / Self Care | Payer: No Typology Code available for payment source | Source: Ambulatory Visit | Attending: Neurology | Admitting: Neurology

## 2020-02-20 ENCOUNTER — Other Ambulatory Visit: Payer: Self-pay

## 2020-02-20 DIAGNOSIS — R569 Unspecified convulsions: Secondary | ICD-10-CM

## 2020-02-20 MED ORDER — GADOBENATE DIMEGLUMINE 529 MG/ML IV SOLN
16.0000 mL | Freq: Once | INTRAVENOUS | Status: AC | PRN
  Administered 2020-02-20: 16 mL via INTRAVENOUS

## 2020-02-24 ENCOUNTER — Inpatient Hospital Stay: Admission: RE | Admit: 2020-02-24 | Payer: No Typology Code available for payment source | Source: Ambulatory Visit

## 2020-02-25 ENCOUNTER — Ambulatory Visit: Payer: No Typology Code available for payment source | Admitting: Pharmacist

## 2020-02-25 ENCOUNTER — Other Ambulatory Visit: Payer: Self-pay

## 2020-02-25 DIAGNOSIS — Z79899 Other long term (current) drug therapy: Secondary | ICD-10-CM

## 2020-02-25 NOTE — Progress Notes (Signed)
Medication Management Clinic Visit Note  Patient: Erica Gomez MRN: [id number] Date of Birth: [birth year] PCP: Langston Reusing, NP   Trish Fountain 55 y.o. female presents for a telephone medication management visit with the pharmacist today. Verified patient identity with two identifiers.   There were no vitals taken for this visit.  Patient Information   Past Medical History:  Diagnosis Date  . Allergy   . Anxiety   . Asthma   . Bilateral carpal tunnel syndrome 12/31/2018  . Bipolar affective, manic ([place]) 1996  . COPD (chronic obstructive pulmonary disease) ([place])   . Depression   . HLD (hyperlipidemia) 03/12/2018  . Lung nodule, multiple 09/26/2016  . Manic depressive illness ([place]) 1996  . Seizures ([place])   . Substance abuse ([place])   . Tobacco abuse 10/04/2016      Past Surgical History:  Procedure Laterality Date  . ABDOMINAL HYSTERECTOMY    . CARPAL TUNNEL RELEASE Right 01/15/2019   Procedure: RIGHTCARPAL TUNNEL RELEASE, LEFT DEQUERVAINS INJECTION;  Surgeon: Leandrew Koyanagi, MD;  Location: [facility];  Service: Orthopedics;  Laterality: Right;  . CARPAL TUNNEL RELEASE Left 02/26/2019   Procedure: LEFT CARPAL TUNNEL RELEASE;  Surgeon: Leandrew Koyanagi, MD;  Location: [facility];  Service: Orthopedics;  Laterality: Left;  Bier block  . COLONOSCOPY WITH PROPOFOL N/A 11/28/2018   Procedure: COLONOSCOPY WITH PROPOFOL;  Surgeon: [name], MD;  Location[facility] ENDOSCOPY;  Service: Gastroenterology;  Laterality: N/A;  . TUBAL LIGATION       Family History  Problem Relation Age of Onset  . Stroke Mother   . Heart attack Father     New Diagnoses (since last visit): Seizures  Family Support: Good  Lifestyle Diet: Breakfast: roast beef, cereal, bacon/eggs (ranges from breakfast items to lunch items) Lunch: sometimes skips; if she does have lunch usually ham/cheese sandwich or PB crackers - usually a lighter meal/snack Dinner: baked chicken,  veggies, potato salad (cutting back on potato salad) Drinks: pepsi with lunch and dinner (spaces it out throughout the day), water    Current Exercise Habits: Home exercise routine (walking), Time (Minutes): 30, Frequency (Times/Week): 3, Weekly Exercise (Minutes/Week): 90       Social History   Substance and Sexual Activity  Alcohol Use No      Social History   Tobacco Use  Smoking Status Current Every Day Smoker  . Packs/day: 1.00  . Years: 36.00  . Pack years: 36.00  . Types: Cigarettes  Smokeless Tobacco Former Systems developer  . Types: Chew, Snuff  Tobacco Comment   Using chew to wean herself off cigarettes      Health Maintenance  Topic Date Due  . Hepatitis C Screening  Never done  . OPHTHALMOLOGY EXAM  Never done  . COVID-19 Vaccine (1) Never done  . TETANUS/TDAP  Never done  . PAP SMEAR-Modifier  Never done  . FOOT EXAM  01/01/2020  . HEMOGLOBIN A1C  06/22/2020  . URINE MICROALBUMIN  12/23/2020  . COLONOSCOPY (Pts 45-16yrs Insurance coverage will need to be confirmed)  11/27/2021  . MAMMOGRAM  01/19/2022  . INFLUENZA VACCINE  Completed  . PNEUMOCOCCAL POLYSACCHARIDE VACCINE AGE 26-64 HIGH RISK  Completed  . HIV Screening  Completed   Health Maintenance/Date Completed  Last ED visit: 12/07/2019 Last Visit to PCP: 12/16/2019 Specialist Visit: last visit was with neurologist on 02/20/2020 Dental Exam: a couple years ago Eye Exam: ~4 years ago (pt expressed she needs to go to get  an updated prescription) Pelvic/PAP Exam: 01/2020 Mammogram: 01/2020 - pt to go back to breast imaging on 03/08/2020 Colonoscopy: 11/28/2018 Flu Vaccine: 12/16/2019 Pneumonia Vaccine: 01/01/2019 COVID-19 Vaccine: completed, including booster Shingrix Vaccine: due  Outpatient Encounter Medications as of 02/25/2020  Medication Sig  . albuterol (VENTOLIN HFA) 108 (90 Base) MCG/ACT inhaler INHALE 2 PUFFS EVERY 4 TO 6 HOURS AS NEEDED  . aspirin EC 81 MG tablet Take 1 tablet (81 mg total) by mouth  daily. No ibuprofen for at least one hour  . citalopram (CELEXA) 20 MG tablet Take 40 mg by mouth daily.  . Fluticasone-Salmeterol (ADVAIR DISKUS) 500-50 MCG/DOSE AEPB Inhale 1 puff into the lungs 2 (two) times daily.  Marland Kitchen ipratropium-albuterol (DUONEB) 0.5-2.5 (3) MG/3ML SOLN Take 3 mLs by nebulization every 6 (six) hours. (Patient taking differently: Take 3 mLs by nebulization every 6 (six) hours as needed.)  . lamoTRIgine (LAMICTAL) 100 MG tablet Take 1 tablet (100 mg total) by mouth 2 (two) times daily. (Patient taking differently: Take 100 mg by mouth daily.)  . levETIRAcetam (KEPPRA) 500 MG tablet Take 1 tablet (500 mg total) by mouth 2 (two) times daily.  . metFORMIN (GLUCOPHAGE) 1000 MG tablet Take 0.5 tablets (500 mg total) by mouth 2 (two) times daily with a meal.  . rosuvastatin (CRESTOR) 10 MG tablet Take 1 tablet (10 mg total) by mouth daily.  Marland Kitchen tiotropium (SPIRIVA HANDIHALER) 18 MCG inhalation capsule Place 1 capsule (18 mcg total) into inhaler and inhale daily.  . traZODone (DESYREL) 100 MG tablet Take 100 mg by mouth at bedtime as needed for sleep.   No facility-administered encounter medications on file as of 02/25/2020.     Assessment and Plan: Diabetes Patient is currently taking metformin and is well controlled. Her last A1c was 6.7% on 12/24/2019 which is at goal <7%. Patient is not currently checking her BG at home because she no longer has a meter/kit and inquired about getting new supplies. Will follow up to get patient the meter and kit in addition to the strips and lancets. Patient exercises 3x/wk for 30 minutes by going on walks with her husband. She has a well balanced diet and I encouraged the patient to continue to eat protein and veggies while limiting her sweets. She drinks ~2 pepsi per day but spaces it out throughout the day. Patient stated she will try to cut back and focus more on drinking water. Overall her diabetes is well controlled and I expressed to the patient  to continue her diet and exercise.  Seizures Patient is currently taking lamotrigine and levetiracetam for her seizures which is relatively new for her. She had a history of seizures when she was 71 and the episodes began to increase in September 2021 and 2 additional episodes in October 2021. She was started on the levetiracetam which has helped immensely as the patient did not report anymore new seizures. She follows with a neurologist and was just recently seen on 02/20/2020. Her lamotrigine levels were WNL. Pt did report shakiness/jerks when taking lamotrigine BID, so she now takes it once daily and those symptoms have subsided without any new seizures.   COPD Patient currently takes albuterol PRN, Advair, Duoneb PRN, and Spiriva. She reports appropriate technique and adherence. She has had to use her albuterol more often (~2x/wk) due to the cold weather especially if it is windy which she reports resolves her symptoms but in normal conditions she does not have any issues with SOB/wheezing or exacerbations. Encouraged pt to continue  regimen and to seek medical attention if symptoms worsen.  Bipolar disorder Patient currently takes lamotrigine and citalopram and did not express any concerns or issues.   Bilateral carpal tunnel syndrome Patient had surgery for her carpal tunnel which she reports was helpful. She only has a little bit of pain from time to time if she lifts something too heavy, however she uses a cream (Timeout) which relieves her pain.   Tobacco abuse Patient currently smokes 1 ppd which has not increased or decreased since last visit. Patient is working on quitting and has been using the the lozenges to help her cut back.   Access/Adherence Patient has a pill box at home and her husband also helps remind her to take her medications. Patient did request refill for her rosuvastatin.    Sherilyn Banker, PharmD Pharmacy Resident  02/25/2020 9:40 AM

## 2020-03-08 ENCOUNTER — Other Ambulatory Visit: Payer: Self-pay

## 2020-03-08 ENCOUNTER — Ambulatory Visit
Admission: RE | Admit: 2020-03-08 | Discharge: 2020-03-08 | Disposition: A | Discharge status: Home / Self Care | Payer: No Typology Code available for payment source | Source: Ambulatory Visit | Attending: Oncology | Admitting: Oncology

## 2020-03-08 DIAGNOSIS — N63 Unspecified lump in unspecified breast: Secondary | ICD-10-CM

## 2020-04-01 ENCOUNTER — Telehealth: Payer: Self-pay | Admitting: Pharmacist

## 2020-04-01 NOTE — Telephone Encounter (Signed)
04/01/2020 8:21:31 AM - ProAir refill faxed to [facility] - Thursday, April 01, 2020 8:20 AM -- Received notice from [facility] for refill on ProAir--checked and faxed back to [facility].

## 2020-04-02 ENCOUNTER — Telehealth: Payer: Self-pay | Admitting: Neurology

## 2020-04-02 NOTE — Telephone Encounter (Signed)
I talked with medication management clinic pharmacist Gerald Stabs at [phone], patient was accidentally dispensed keppra 500mg  2 tabs twice 3 months ago,  instead of written 500mg  bid.  I have advised to give her keppra 500mg  bid,   I failed to reach her, please try again, check on her how is she doing with current medication.   May bring in for an earlier follow up with Judson Roch if needed.

## 2020-04-05 ENCOUNTER — Encounter: Payer: Self-pay | Admitting: *Deleted

## 2020-04-05 ENCOUNTER — Other Ambulatory Visit: Payer: Self-pay | Admitting: *Deleted

## 2020-04-05 DIAGNOSIS — N63 Unspecified lump in unspecified breast: Secondary | ICD-10-CM

## 2020-04-05 NOTE — Telephone Encounter (Signed)
Ok, will keep keppra 500mg  bid, check keppra level at next follow up visit.

## 2020-04-05 NOTE — Telephone Encounter (Signed)
I spoke to the patient to clarify the Keppra 500mg  dosage. She will start taking one tablet twice daily. She will keep her pending follow up. I ask her to call us back with any concerns or to report seizure activity.

## 2020-04-05 NOTE — Telephone Encounter (Signed)
I spoke to the patient. States she has been doing okay on Keppra 500mg , two tabs twice daily. She has no problem reducing the dosage back down to one tab twice daily at Dr. Rhea Belton direction.  She did report having two "small" seizures in the last month. However, she had missed doses of Keppra surrounding both events.

## 2020-04-22 NOTE — Progress Notes (Signed)
Cardiology Office Note  Date:  04/23/2020   ID:  Erica Gomez, DOB 02/11/1966, MRN [id number]  PCP:  Langston Reusing, NP   Chief Complaint  Patient presents with  . 12 month follow up     Patient c/o shortness of breath with over exertion. Medications reviewed by the patient verbally.     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  Still smoking, 2 pack a day Not using inhalers Going to use nicotine lozanges  Denies any chest wall pain, chest tightness, Denies shortness of breath leg swelling  CT scan chest October 2018 reviewed with her in detail Significant mid to distal LAD calcification, proximal RCA No significant atherosclerosis in the aorta  Previous Echo 02/2013 EF 60 - 65%  EKG personally reviewed by myself on todays visit Shows normal sinus rhythm rate 97 bpm no significant ST or T wave changes  PMH:   has a past medical history of Allergy, Anxiety, Asthma, Bilateral carpal tunnel syndrome (12/31/2018), Bipolar affective, manic ([place]) (1996), COPD (chronic obstructive pulmonary disease) ([place]), Depression, HLD (hyperlipidemia) (03/12/2018), Lung nodule, multiple (09/26/2016), Manic depressive illness ([place]) (1996), Seizures ([place]), Substance abuse ([place]), and Tobacco abuse (10/04/2016).  PSH:    Past Surgical History:  Procedure Laterality Date  . ABDOMINAL HYSTERECTOMY    . CARPAL TUNNEL RELEASE Right 01/15/2019   Procedure: RIGHTCARPAL TUNNEL RELEASE, LEFT DEQUERVAINS INJECTION;  Surgeon: Leandrew Koyanagi, MD;  Location: [facility];  Service: Orthopedics;  Laterality: Right;  . CARPAL TUNNEL RELEASE Left 02/26/2019   Procedure: LEFT CARPAL TUNNEL RELEASE;  Surgeon:  Leandrew Koyanagi, MD;  Location: [facility];  Service: Orthopedics;  Laterality: Left;  Bier block  . COLONOSCOPY WITH PROPOFOL N/A 11/28/2018   Procedure: COLONOSCOPY WITH PROPOFOL;  Surgeon: [name], MD;  Location[facility] ENDOSCOPY;  Service: Gastroenterology;  Laterality: N/A;  . TUBAL LIGATION      Current Outpatient Medications  Medication Sig Dispense Refill  . albuterol (VENTOLIN HFA) 108 (90 Base) MCG/ACT inhaler INHALE 2 PUFFS EVERY 4 TO 6 HOURS AS NEEDED 54 g 0  . aspirin EC 81 MG tablet Take 1 tablet (81 mg total) by mouth daily. No ibuprofen for at least one hour    . citalopram (CELEXA) 20 MG tablet Take 40 mg by mouth daily.    . Fluticasone-Salmeterol (ADVAIR DISKUS) 500-50 MCG/DOSE AEPB Inhale 1 puff into the lungs 2 (two) times daily. 3 each 1  . ipratropium-albuterol (DUONEB) 0.5-2.5 (3) MG/3ML SOLN Take 3 mLs by nebulization every 6 (six) hours. 360 mL 2  . lamoTRIgine (LAMICTAL) 100 MG tablet Take 1 tablet (100 mg total) by mouth 2 (two) times daily. 180 tablet 3  . levETIRAcetam (KEPPRA) 500 MG tablet Take 1 tablet (500 mg total) by mouth 2 (two) times daily. 180 tablet 4  . metFORMIN (GLUCOPHAGE) 1000 MG tablet Take 0.5 tablets (500 mg total) by mouth 2 (two) times daily with a meal. 60 tablet 3  . rosuvastatin (CRESTOR) 10 MG tablet Take 1 tablet (10 mg total) by mouth daily. 90 tablet 4  . tiotropium (SPIRIVA HANDIHALER) 18 MCG inhalation capsule Place 1 capsule (18 mcg total) into inhaler and inhale daily. 30 capsule  11  . traZODone (DESYREL) 100 MG tablet Take 100 mg by mouth at bedtime as needed for sleep.     No current facility-administered medications for this visit.    Allergies:   Flax seed oil [bio-flax]   Social History:  The patient  reports that she has been smoking cigarettes. She has a 36.00 pack-year smoking history. She has quit using smokeless tobacco.  Her smokeless tobacco use included chew and snuff. She reports that she does not  drink alcohol and does not use drugs.   Family History:   family history includes Heart attack in her father; Stroke in her mother.    Review of Systems: Review of Systems  Constitutional: Negative.   Respiratory: Negative.   Cardiovascular: Negative.   Gastrointestinal: Negative.   Musculoskeletal: Negative.   Neurological: Negative.   Psychiatric/Behavioral: Negative.   All other systems reviewed and are negative.   PHYSICAL EXAM: VS:  BP 110/70 (BP Location: Left Arm, Patient Position: Sitting, Cuff Size: Normal)   Pulse 97   Ht 5\' 6"  (1.676 m)   Wt 177 lb (80.3 kg)   SpO2 98%   BMI 28.57 kg/m  , BMI Body mass index is 28.57 kg/m. GEN: Well nourished, well developed, in no acute distress  HEENT: normal  Neck: no JVD, carotid bruits, or masses Cardiac: RRR; no murmurs, rubs, or gallops,no edema  Respiratory:  clear to auscultation bilaterally, normal work of breathing GI: soft, nontender, nondistended, + BS MS: no deformity or atrophy  Skin: warm and dry, no rash Neuro:  Strength and sensation are intact Psych: euthymic mood, full affect   Recent Labs: 12/07/2019: ALT 17; BUN 14; Creatinine, Ser 1.17; Hemoglobin 12.8; Magnesium 2.2; Platelets 279; Potassium 3.8; Sodium 139    Lipid Panel Lab Results  Component Value Date   CHOL 139 12/24/2019   HDL 48 12/24/2019   LDLCALC 70 12/24/2019   TRIG 114 12/24/2019      Wt Readings from Last 3 Encounters:  04/23/20 177 lb (80.3 kg)  02/10/20 178 lb 9.6 oz (81 kg)  01/20/20 173 lb (78.5 kg)      ASSESSMENT AND PLAN:  Tobacco abuse We have encouraged her to continue to work on weaning her cigarettes and smoking cessation.   Chest pain with moderate risk for cardiac etiology - Plan: EKG 12-Lead No recent episodes, no further work-up at this time Smoking cessation, stay on crestor Lipids at goal  Centrilobular emphysema ([place]) Long discussion concerning smoking cessation  Type 2 diabetes mellitus without  complication, without long-term current use of insulin (HCC) HGBA1C 6.7 Stressed importance of low carbohydrate diet, walking program We have encouraged continued exercise, careful diet management in an effort to lose weight.  Coronary artery disease Significant coronary calcification seen on CT scan 2018 Long history of smoking, cessation Cholesterol is at goal on the current lipid regimen. No changes to the medications were made.    Total encounter time more than 25 minutes  Greater than 50% was spent in counseling and coordination of care with the patient  No orders of the defined types were placed in this encounter.    Signed, Esmond Plants, M.D., Ph.D. 04/23/2020  [place], [place]

## 2020-04-23 ENCOUNTER — Encounter: Payer: Self-pay | Admitting: Cardiovascular Disease

## 2020-04-23 ENCOUNTER — Other Ambulatory Visit: Payer: Self-pay

## 2020-04-23 ENCOUNTER — Ambulatory Visit (INDEPENDENT_AMBULATORY_CARE_PROVIDER_SITE_OTHER): Payer: Self-pay | Admitting: Cardiovascular Disease

## 2020-04-23 VITALS — BP 110/70 | HR 97 | Ht 66.0 in | Wt 177.0 lb

## 2020-04-23 DIAGNOSIS — E118 Type 2 diabetes mellitus with unspecified complications: Secondary | ICD-10-CM

## 2020-04-23 DIAGNOSIS — F17200 Nicotine dependence, unspecified, uncomplicated: Secondary | ICD-10-CM

## 2020-04-23 DIAGNOSIS — E782 Mixed hyperlipidemia: Secondary | ICD-10-CM

## 2020-04-23 DIAGNOSIS — I25118 Atherosclerotic heart disease of native coronary artery with other forms of angina pectoris: Secondary | ICD-10-CM

## 2020-04-23 NOTE — Patient Instructions (Signed)
Medication Instructions:  No changes  If you need a refill on your cardiac medications before your next appointment, please call your pharmacy.    Lab work: No new labs needed   If you have labs (blood work) drawn today and your tests are completely normal, you will receive your results only by: . MyChart Message (if you have MyChart) OR . A paper copy in the mail If you have any lab test that is abnormal or we need to change your treatment, we will call you to review the results.   Testing/Procedures: No new testing needed   Follow-Up: At [facility], you and your health needs are our priority.  As part of our continuing mission to provide you with exceptional heart care, we have created designated Provider Care Teams.  These Care Teams include your primary Cardiologist (physician) and Advanced Practice Providers (APPs -  Physician Assistants and Nurse Practitioners) who all work together to provide you with the care you need, when you need it.  . You will need a follow up appointment in 12 months  . Providers on your designated Care Team:   . Christopher Berge, NP . Ryan Dunn, PA-C . Jacquelyn Visser, PA-C  Any Other Special Instructions Will Be Listed Below (If Applicable).  COVID-19 Vaccine Information can be found at: [email or web address] For questions related to vaccine distribution or appointments, please email [email or web address] or call [phone].     

## 2020-05-11 ENCOUNTER — Other Ambulatory Visit: Payer: Self-pay | Admitting: Gerontology

## 2020-05-11 ENCOUNTER — Ambulatory Visit: Payer: Medicaid Other | Admitting: Gerontology

## 2020-05-11 ENCOUNTER — Other Ambulatory Visit: Payer: Self-pay

## 2020-05-11 VITALS — BP 123/78 | HR 96 | Temp 97.0°F | Ht 66.5 in | Wt 178.0 lb

## 2020-05-11 DIAGNOSIS — F419 Anxiety disorder, unspecified: Secondary | ICD-10-CM

## 2020-05-11 DIAGNOSIS — J439 Emphysema, unspecified: Secondary | ICD-10-CM

## 2020-05-11 DIAGNOSIS — Z720 Tobacco use: Secondary | ICD-10-CM

## 2020-05-11 DIAGNOSIS — I25118 Atherosclerotic heart disease of native coronary artery with other forms of angina pectoris: Secondary | ICD-10-CM

## 2020-05-11 DIAGNOSIS — M26609 Unspecified temporomandibular joint disorder, unspecified side: Secondary | ICD-10-CM

## 2020-05-11 DIAGNOSIS — E119 Type 2 diabetes mellitus without complications: Secondary | ICD-10-CM

## 2020-05-11 DIAGNOSIS — E782 Mixed hyperlipidemia: Secondary | ICD-10-CM

## 2020-05-11 DIAGNOSIS — R569 Unspecified convulsions: Secondary | ICD-10-CM

## 2020-05-11 LAB — POCT GLYCOSYLATED HEMOGLOBIN (HGB A1C)
HbA1c POC (<> result, manual entry): 6.5 % (ref 4.0–5.6)
HbA1c, POC (controlled diabetic range): 0 % (ref 0.0–7.0)
HbA1c, POC (prediabetic range): 0 % — AB (ref 5.7–6.4)
Hemoglobin A1C: 6.5 % — AB (ref 4.0–5.6)

## 2020-05-11 MED ORDER — METFORMIN HCL 1000 MG PO TABS: 500.0000 mg | ORAL_TABLET | Freq: Two times a day (BID) | ORAL | 3 refills | Status: DC

## 2020-05-11 MED ORDER — ALBUTEROL SULFATE HFA 108 (90 BASE) MCG/ACT IN AERS: INHALATION_SPRAY | RESPIRATORY_TRACT | 1 refills | Status: DC

## 2020-05-11 NOTE — Progress Notes (Signed)
Established Patient Office Visit  Subjective:  Patient ID: Erica Gomez, female    DOB: 03/24/1965  Age: 55 y.o. MRN: [id number]  CC: Follow up  HPI FELISHIA WARTMAN is a 55 year old female who presents for follow up for DM, COPD, seizures, CAD, HLD, and anxiety. She is compliant with her medication regimen and continues to make healthy lifestyle changes. She has decreased the amount she smokes a day to 0.5 ppd and is utilizing nicotene lozenges. Her breathing is well-controlled and she denies any dyspnea, SOB, or wheezing. She requires minimal use of her rescue inhaler. She occasionally checks her blood sugars at home and reports readings anywhere from 90's to 120's. She denies any hypo- or hyperglycemia. She denies polyuria, polyphagia, polydipsia or peripheral neuropathy. She performs daily self foot exams. Today, her FSBG is 114 mg/dL and her Hgb A1c is 6.5% from 6.7% in December. She has not had any recent seizure activity and continues to take her medications as prescribed. She denies any recent chest pain or palpations. Her BP is well-controlled and she denies any swelling to her lower extremities. Her last lipid panel in November showed total cholesterol of 139 mg/dL, HDL 48 mg/dL, and LDL 70 mg/dL. She reports exercising daily and making appropriate dietary choices. Her mood is stable and she denies any SI/HI.   She does report some mild discomfort in her right jaw, radiating to her ear. She was previously prescribed antihistamine and told it was related to her sinuses and allergies. She admits that sometimes her jaw "locks" and has a popping sensation. It is exacerbated with jaw opening and chewing. The pain is intermittent and mild. Overall, she feels well and offers no further complaints.   Past Medical History:  Diagnosis Date  . Allergy   . Anxiety   . Asthma   . Bilateral carpal tunnel syndrome 12/31/2018  . Bipolar affective, manic ([place]) 1996  . COPD (chronic obstructive  pulmonary disease) ([place])   . Depression   . HLD (hyperlipidemia) 03/12/2018  . Lung nodule, multiple 09/26/2016  . Manic depressive illness ([place]) 1996  . Seizures ([place])   . Substance abuse ([place])   . Tobacco abuse 10/04/2016    Past Surgical History:  Procedure Laterality Date  . ABDOMINAL HYSTERECTOMY    . CARPAL TUNNEL RELEASE Right 01/15/2019   Procedure: RIGHTCARPAL TUNNEL RELEASE, LEFT DEQUERVAINS INJECTION;  Surgeon: Leandrew Koyanagi, MD;  Location: [facility];  Service: Orthopedics;  Laterality: Right;  . CARPAL TUNNEL RELEASE Left 02/26/2019   Procedure: LEFT CARPAL TUNNEL RELEASE;  Surgeon: Leandrew Koyanagi, MD;  Location: [facility];  Service: Orthopedics;  Laterality: Left;  Bier block  . COLONOSCOPY WITH PROPOFOL N/A 11/28/2018   Procedure: COLONOSCOPY WITH PROPOFOL;  Surgeon: [name], MD;  [facility] ENDOSCOPY;  Service: Gastroenterology;  Laterality: N/A;  . TUBAL LIGATION      Family History  Problem Relation Age of Onset  . Stroke Mother   . Heart attack Father     Social History   Socioeconomic History  . Marital status: Married    Spouse name: Not on file  . Number of children: 2  . Years of education: taking some medical billing classes now  . Highest education level: Not on file  Occupational History  . Occupation: Caretaker  Tobacco Use  . Smoking status: Current Every Day Smoker    Packs/day: 1.00    Years: 36.00    Pack years: 36.00  Types: Cigarettes  . Smokeless tobacco: Former Systems developer    Types: Chew, Snuff  . Tobacco comment: Using chew to wean herself off cigarettes  Vaping Use  . Vaping Use: Former  Substance and Sexual Activity  . Alcohol use: No  . Drug use: No    Types: "Crack" cocaine    Comment: history 2013  . Sexual activity: Yes    Birth control/protection: Condom, None  Other Topics Concern  . Not on file  Social History Narrative   Lives with her husband.   Right-handed.   Caffeine use: 2 cups  daily.   Social Determinants of Health   Financial Resource Strain: Not on file  Food Insecurity: Not on file  Transportation Needs: Not on file  Physical Activity: Not on file  Stress: Not on file  Social Connections: Not on file  Intimate Partner Violence: Not on file    Outpatient Medications Prior to Visit  Medication Sig Dispense Refill  . albuterol (VENTOLIN HFA) 108 (90 Base) MCG/ACT inhaler INHALE 2 PUFFS EVERY 4 TO 6 HOURS AS NEEDED 54 g 0  . aspirin EC 81 MG tablet Take 1 tablet (81 mg total) by mouth daily. No ibuprofen for at least one hour    . citalopram (CELEXA) 20 MG tablet Take 40 mg by mouth daily.    . Fluticasone-Salmeterol (ADVAIR DISKUS) 500-50 MCG/DOSE AEPB Inhale 1 puff into the lungs 2 (two) times daily. 3 each 1  . ipratropium-albuterol (DUONEB) 0.5-2.5 (3) MG/3ML SOLN Take 3 mLs by nebulization every 6 (six) hours. 360 mL 2  . lamoTRIgine (LAMICTAL) 100 MG tablet Take 1 tablet (100 mg total) by mouth 2 (two) times daily. 180 tablet 3  . levETIRAcetam (KEPPRA) 500 MG tablet Take 1 tablet (500 mg total) by mouth 2 (two) times daily. 180 tablet 4  . metFORMIN (GLUCOPHAGE) 1000 MG tablet Take 0.5 tablets (500 mg total) by mouth 2 (two) times daily with a meal. 60 tablet 3  . rosuvastatin (CRESTOR) 10 MG tablet Take 1 tablet (10 mg total) by mouth daily. 90 tablet 4  . tiotropium (SPIRIVA HANDIHALER) 18 MCG inhalation capsule Place 1 capsule (18 mcg total) into inhaler and inhale daily. 30 capsule 11  . traZODone (DESYREL) 100 MG tablet Take 100 mg by mouth at bedtime as needed for sleep.     No facility-administered medications prior to visit.    Allergies  Allergen Reactions  . Flax Seed Oil [Bio-Flax] Hives    Pt. Self reported    ROS Review of Systems  Constitutional: Negative.   HENT: Positive for ear pain (intermittent; radiates from jaw).        Right jaw locks and pops   Respiratory: Negative.   Cardiovascular: Negative.   Gastrointestinal:  Negative.   Endocrine: Negative.   Genitourinary: Negative.   Musculoskeletal: Negative.   Skin: Negative.   Neurological: Negative.   Psychiatric/Behavioral: Negative.       Objective:    Physical Exam Constitutional:      Appearance: Normal appearance. She is normal weight.  HENT:     Head: Normocephalic.     Jaw: No tenderness or pain on movement.     Right Ear: Tympanic membrane, ear canal and external ear normal.     Left Ear: Tympanic membrane, ear canal and external ear normal.     Nose: Nose normal.     Mouth/Throat:     Mouth: Mucous membranes are moist.     Pharynx: Oropharynx is clear.  Eyes:     Conjunctiva/sclera: Conjunctivae normal.     Pupils: Pupils are equal, round, and reactive to light.  Cardiovascular:     Rate and Rhythm: Normal rate and regular rhythm.     Pulses: Normal pulses.     Heart sounds: Normal heart sounds.  Pulmonary:     Effort: Pulmonary effort is normal.     Breath sounds: Normal breath sounds. No wheezing.  Abdominal:     General: Bowel sounds are normal.     Palpations: Abdomen is soft.  Musculoskeletal:     Right lower leg: No edema.     Left lower leg: No edema.  Skin:    General: Skin is warm and dry.  Neurological:     Mental Status: She is alert and oriented to person, place, and time. Mental status is at baseline.  Psychiatric:        Mood and Affect: Mood normal.        Behavior: Behavior normal.     There were no vitals taken for this visit. Wt Readings from Last 3 Encounters:  04/23/20 177 lb (80.3 kg)  02/10/20 178 lb 9.6 oz (81 kg)  01/20/20 173 lb (78.5 kg)     Health Maintenance Due  Topic Date Due  . Hepatitis C Screening  Never done  . COVID-19 Vaccine (1) Never done  . OPHTHALMOLOGY EXAM  Never done  . TETANUS/TDAP  Never done  . PAP SMEAR-Modifier  Never done  . FOOT EXAM  01/01/2020    There are no preventive care reminders to display for this patient.  Lab Results  Component Value Date    TSH 1.030 05/11/2016   Lab Results  Component Value Date   WBC 9.8 12/07/2019   HGB 12.8 12/07/2019   HCT 40.4 12/07/2019   MCV 93.3 12/07/2019   PLT 279 12/07/2019   Lab Results  Component Value Date   NA 139 12/07/2019   K 3.8 12/07/2019   CO2 24 12/07/2019   GLUCOSE 139 (H) 12/07/2019   BUN 14 12/07/2019   CREATININE 1.17 (H) 12/07/2019   BILITOT 0.7 12/07/2019   ALKPHOS 58 12/07/2019   AST 17 12/07/2019   ALT 17 12/07/2019   PROT 6.5 12/07/2019   ALBUMIN 3.8 12/07/2019   CALCIUM 9.5 12/07/2019   ANIONGAP 11 12/07/2019   Lab Results  Component Value Date   CHOL 139 12/24/2019   Lab Results  Component Value Date   HDL 48 12/24/2019   Lab Results  Component Value Date   LDLCALC 70 12/24/2019   Lab Results  Component Value Date   TRIG 114 12/24/2019   Lab Results  Component Value Date   CHOLHDL 2.9 12/24/2019   Lab Results  Component Value Date   HGBA1C 6.7 (H) 12/24/2019      Assessment & Plan:   1. Temporomandibular joint disorder (TMJ) Use OTC NSAID such as ibuprofen or aleve PRN for pain. Monitor for GI upset or blood in stool.  Notify if worse or no improvement; can refer to dentist for further evaluation and treatment.  2. Pulmonary emphysema, unspecified emphysema type ([place]) Well-controlled on current regimen.  Continue exercising as tolerated. Continue with current medication regimen.  - albuterol (VENTOLIN HFA) 108 (90 Base) MCG/ACT inhaler; INHALE 2 PUFFS EVERY 4 TO 6 HOURS AS NEEDED  Dispense: 54 g; Refill: 1  3. Type 2 diabetes mellitus without complication, without long-term current use of insulin (HCC) Monitor intake of carbs and avoid sugary drinks.  Continue to exercise 150 min/week  Monitor for s/s of hyper and hypoglycemia at home Daily self foot exams Continue current medication regimen Optho referral provided Diabetic foot exam completed  - POCT HgB A1C - Comp Met (CMET) - metFORMIN (GLUCOPHAGE) 1000 MG tablet; Take 0.5  tablets (500 mg total) by mouth 2 (two) times daily with a meal.  Dispense: 60 tablet; Refill: 3  4. Coronary artery disease of native artery of native heart with stable angina pectoris ([place]) No episodes of CP or palpitations; notify and go to the ED if either develop. Continue current medication regimen.   5. Seizure ([place]) Follow up with neurology in May Discuss driving abilities and precautions with neurology Pt asked about Hydroxycut for weight loss and management - advised to clear with neurology  6. Tobacco abuse Smoking cessation advised. [place] provided. Pt has cut down from 2 ppd to 0.5 ppd - CT CHEST LUNG CA SCREEN LOW DOSE W/O CM; Future  7. Anxiety Well-controlled. No SI/HI. Notify if changes in mood or SI/HI develop. Continue current medication regimen. Follow up with [facility].   8. Mixed hyperlipidemia Previous lipid panel unremarkable.  Continue current regimen.  Follow-up: Return in 3 months (08/11/20), or if symptoms do not improve or worsen.    Clayton Bibles, RN, BSN, FNP-S

## 2020-05-11 NOTE — Patient Instructions (Signed)
Smoking Tobacco Information, Adult Smoking tobacco can be harmful to your health. Tobacco contains a poisonous (toxic), colorless chemical called nicotine. Nicotine is addictive. It changes the brain and can make it hard to stop smoking. Tobacco also has other toxic chemicals that can hurt your body and raise your risk of many cancers. How can smoking tobacco affect me? Smoking tobacco puts you at risk for:  Cancer. Smoking is most commonly associated with lung cancer, but can also lead to cancer in other parts of the body.  Chronic obstructive pulmonary disease (COPD). This is a long-term lung condition that makes it hard to breathe. It also gets worse over time.  High blood pressure (hypertension), heart disease, stroke, or heart attack.  Lung infections, such as pneumonia.  Cataracts. This is when the lenses in the eyes become clouded.  Digestive problems. This may include peptic ulcers, heartburn, and gastroesophageal reflux disease (GERD).  Oral health problems, such as gum disease and tooth loss.  Loss of taste and smell. Smoking can affect your appearance by causing:  Wrinkles.  Yellow or stained teeth, fingers, and fingernails. Smoking tobacco can also affect your social life, because:  It may be challenging to find places to smoke when away from home. Many workplaces, restaurants, hotels, and public places are tobacco-free.  Smoking is expensive. This is due to the cost of tobacco and the long-term costs of treating health problems from smoking.  Secondhand smoke may affect those around you. Secondhand smoke can cause lung cancer, breathing problems, and heart disease. Children of smokers have a higher risk for: ? Sudden infant death syndrome (SIDS). ? Ear infections. ? Lung infections. If you currently smoke tobacco, quitting now can help you:  Lead a longer and healthier life.  Look, smell, breathe, and feel better over time.  Save money.  Protect others from the  harms of secondhand smoke. What actions can I take to prevent health problems? Quit smoking  Do not start smoking. Quit if you already do.  Make a plan to quit smoking and commit to it. Look for programs to help you and ask your health care provider for recommendations and ideas.  Set a date and write down all the reasons you want to quit.  Let your friends and family know you are quitting so they can help and support you. Consider finding friends who also want to quit. It can be easier to quit with someone else, so that you can support each other.  Talk with your health care provider about using nicotine replacement medicines to help you quit, such as gum, lozenges, patches, sprays, or pills.  Do not replace cigarette smoking with electronic cigarettes, which are commonly called e-cigarettes. The safety of e-cigarettes is not known, and some may contain harmful chemicals.  If you try to quit but return to smoking, stay positive. It is common to slip up when you first quit, so take it one day at a time.  Be prepared for cravings. When you feel the urge to smoke, chew gum or suck on hard candy.   Lifestyle  Stay busy and take care of your body.  Drink enough fluid to keep your urine pale yellow.  Get plenty of exercise and eat a healthy diet. This can help prevent weight gain after quitting.  Monitor your eating habits. Quitting smoking can cause you to have a larger appetite than when you smoke.  Find ways to relax. Go out with friends or family to a movie or a   restaurant where people do not smoke.  Ask your health care provider about having regular tests (screenings) to check for cancer. This may include blood tests, imaging tests, and other tests.  Find ways to manage your stress, such as meditation, yoga, or exercise. Where to find support To get support to quit smoking, consider:  Asking your health care provider for more information and resources.  Taking classes to learn  more about quitting smoking.  Looking for local organizations that offer resources about quitting smoking.  Joining a support group for people who want to quit smoking in your local community.  Calling the smokefree.gov counselor helpline: 1-800-Quit-Now (302)311-8397) Where to find more information You may find more information about quitting smoking from:  HelpGuide.org: www.helpguide.org  https://hall.com/: smokefree.gov  American Lung Association: www.lung.org Contact a health care provider if you:  Have problems breathing.  Notice that your lips, nose, or fingers turn blue.  Have chest pain.  Are coughing up blood.  Feel faint or you pass out.  Have other health changes that cause you to worry. Summary  Smoking tobacco can negatively affect your health, the health of those around you, your finances, and your social life.  Do not start smoking. Quit if you already do. If you need help quitting, ask your health care provider.  Think about joining a support group for people who want to quit smoking in your local community. There are many effective programs that will help you to quit this behavior. This information is not intended to replace advice given to you by your health care provider. Make sure you discuss any questions you have with your health care provider. Document Revised: 10/25/2018 Document Reviewed: 02/15/2016 Elsevier Patient Education  2021 [place]. https://www.diabeteseducator.org/docs/default-source/living-with-diabetes/conquering-the-grocery-store-v1.pdf?sfvrsn=4">  Carbohydrate Counting for Diabetes Mellitus, Adult Carbohydrate counting is a method of keeping track of how many carbohydrates you eat. Eating carbohydrates naturally increases the amount of sugar (glucose) in the blood. Counting how many carbohydrates you eat improves your blood glucose control, which helps you manage your diabetes. It is important to know how many carbohydrates you can  safely have in each meal. This is different for every person. A dietitian can help you make a meal plan and calculate how many carbohydrates you should have at each meal and snack. What foods contain carbohydrates? Carbohydrates are found in the following foods:  Grains, such as breads and cereals.  Dried beans and soy products.  Starchy vegetables, such as potatoes, peas, and corn.  Fruit and fruit juices.  Milk and yogurt.  Sweets and snack foods, such as cake, cookies, candy, chips, and soft drinks.   How do I count carbohydrates in foods? There are two ways to count carbohydrates in food. You can read food labels or learn standard serving sizes of foods. You can use either of the methods or a combination of both. Using the Nutrition Facts label The Nutrition Facts list is included on the labels of almost all packaged foods and beverages in the [place] It includes:  The serving size.  Information about nutrients in each serving, including the grams (g) of carbohydrate per serving. To use the Nutrition Facts:  Decide how many servings you will have.  Multiply the number of servings by the number of carbohydrates per serving.  The resulting number is the total amount of carbohydrates that you will be having. Learning the standard serving sizes of foods When you eat carbohydrate foods that are not packaged or do not include Nutrition Facts on the  label, you need to measure the servings in order to count the amount of carbohydrates.  Measure the foods that you will eat with a food scale or measuring cup, if needed.  Decide how many standard-size servings you will eat.  Multiply the number of servings by 15. For foods that contain carbohydrates, one serving equals 15 g of carbohydrates. ? For example, if you eat 2 cups or 10 oz (300 g) of strawberries, you will have eaten 2 servings and 30 g of carbohydrates (2 servings x 15 g = 30 g).  For foods that have more than one food mixed,  such as soups and casseroles, you must count the carbohydrates in each food that is included. The following list contains standard serving sizes of common carbohydrate-rich foods. Each of these servings has about 15 g of carbohydrates:  1 slice of bread.  1 six-inch (15 cm) tortilla.  ? cup or 2 oz (53 g) cooked rice or pasta.   cup or 3 oz (85 g) cooked or canned, drained and rinsed beans or lentils.   cup or 3 oz (85 g) starchy vegetable, such as peas, corn, or squash.   cup or 4 oz (120 g) hot cereal.   cup or 3 oz (85 g) boiled or mashed potatoes, or  or 3 oz (85 g) of a large baked potato.   cup or 4 fl oz (118 mL) fruit juice.  1 cup or 8 fl oz (237 mL) milk.  1 small or 4 oz (106 g) apple.   or 2 oz (63 g) of a medium banana.  1 cup or 5 oz (150 g) strawberries.  3 cups or 1 oz (24 g) popped popcorn. What is an example of carbohydrate counting? To calculate the number of carbohydrates in this sample meal, follow the steps shown below. Sample meal  3 oz (85 g) chicken breast.  ? cup or 4 oz (106 g) brown rice.   cup or 3 oz (85 g) corn.  1 cup or 8 fl oz (237 mL) milk.  1 cup or 5 oz (150 g) strawberries with sugar-free whipped topping. Carbohydrate calculation 1. Identify the foods that contain carbohydrates: ? Rice. ? Corn. ? Milk. ? Strawberries. 2. Calculate how many servings you have of each food: ? 2 servings rice. ? 1 serving corn. ? 1 serving milk. ? 1 serving strawberries. 3. Multiply each number of servings by 15 g: ? 2 servings rice x 15 g = 30 g. ? 1 serving corn x 15 g = 15 g. ? 1 serving milk x 15 g = 15 g. ? 1 serving strawberries x 15 g = 15 g. 4. Add together all of the amounts to find the total grams of carbohydrates eaten: ? 30 g + 15 g + 15 g + 15 g = 75 g of carbohydrates total. What are tips for following this plan? Shopping  Develop a meal plan and then make a shopping list.  Buy fresh and frozen vegetables, fresh  and frozen fruit, dairy, eggs, beans, lentils, and whole grains.  Look at food labels. Choose foods that have more fiber and less sugar.  Avoid processed foods and foods with added sugars. Meal planning  Aim to have the same amount of carbohydrates at each meal and for each snack time.  Plan to have regular, balanced meals and snacks. Where to find more information  American Diabetes Association: www.diabetes.org  Centers for Disease Control and Prevention: http://www.wolf.info/ Summary  Carbohydrate  counting is a method of keeping track of how many carbohydrates you eat.  Eating carbohydrates naturally increases the amount of sugar (glucose) in the blood.  Counting how many carbohydrates you eat improves your blood glucose control, which helps you manage your diabetes.  A dietitian can help you make a meal plan and calculate how many carbohydrates you should have at each meal and snack. This information is not intended to replace advice given to you by your health care provider. Make sure you discuss any questions you have with your health care provider. Document Revised: 01/30/2019 Document Reviewed: 01/31/2019 Elsevier Patient Education  2021 [employer].

## 2020-05-12 ENCOUNTER — Other Ambulatory Visit: Payer: Medicaid Other

## 2020-05-12 VITALS — BP 117/76 | HR 87 | Temp 97.4°F | Ht 66.0 in | Wt 175.8 lb

## 2020-05-12 DIAGNOSIS — E119 Type 2 diabetes mellitus without complications: Secondary | ICD-10-CM

## 2020-05-12 NOTE — Progress Notes (Unsigned)
cmp

## 2020-05-13 LAB — COMPREHENSIVE METABOLIC PANEL
ALT: 17 IU/L (ref 0–32)
AST: 14 IU/L (ref 0–40)
Albumin/Globulin Ratio: 2.1 (ref 1.2–2.2)
Albumin: 4.5 g/dL (ref 3.8–4.9)
Alkaline Phosphatase: 77 IU/L (ref 44–121)
BUN/Creatinine Ratio: 9 (ref 9–23)
BUN: 9 mg/dL (ref 6–24)
Bilirubin Total: <0.2 mg/dL (ref 0.0–1.2)
CO2: 22 mmol/L (ref 20–29)
Calcium: 9.6 mg/dL (ref 8.7–10.2)
Chloride: 104 mmol/L (ref 96–106)
Creatinine, Ser: 1.03 mg/dL — ABNORMAL HIGH (ref 0.57–1.00)
Globulin, Total: 2.1 g/dL (ref 1.5–4.5)
Glucose: 114 mg/dL — ABNORMAL HIGH (ref 65–99)
Potassium: 4.6 mmol/L (ref 3.5–5.2)
Sodium: 142 mmol/L (ref 134–144)
Total Protein: 6.6 g/dL (ref 6.0–8.5)
eGFR: 65 mL/min/{1.73_m2} (ref >59)

## 2020-05-17 ENCOUNTER — Other Ambulatory Visit: Payer: Self-pay

## 2020-05-20 ENCOUNTER — Telehealth: Payer: Self-pay | Admitting: *Deleted

## 2020-05-20 DIAGNOSIS — Z122 Encounter for screening for malignant neoplasm of respiratory organs: Secondary | ICD-10-CM

## 2020-05-20 DIAGNOSIS — F17200 Nicotine dependence, unspecified, uncomplicated: Secondary | ICD-10-CM

## 2020-05-20 DIAGNOSIS — Z87891 Personal history of nicotine dependence: Secondary | ICD-10-CM

## 2020-05-20 NOTE — Telephone Encounter (Signed)
Received referral for initial lung cancer screening scan. Contacted patient and obtained smoking history,(current every day smoker, 1 ppd x 36 yrs) as well as answering questions related to screening process. Patient denies signs of lung cancer such as weight loss or hemoptysis. Patient denies comorbidity that would prevent curative treatment if lung cancer were found. Patient is scheduled for shared decision making visit and CT scan on 06/09/20 @10 :45 am.

## 2020-06-09 ENCOUNTER — Other Ambulatory Visit: Payer: Self-pay

## 2020-06-09 ENCOUNTER — Inpatient Hospital Stay: Payer: Medicaid Other | Attending: Oncology | Admitting: Nurse Practitioner

## 2020-06-09 ENCOUNTER — Ambulatory Visit
Admission: RE | Admit: 2020-06-09 | Discharge: 2020-06-09 | Disposition: A | Discharge status: Home / Self Care | Payer: Self-pay | Source: Ambulatory Visit | Attending: Oncology | Admitting: Oncology

## 2020-06-09 DIAGNOSIS — F17200 Nicotine dependence, unspecified, uncomplicated: Secondary | ICD-10-CM | POA: Insufficient documentation

## 2020-06-09 DIAGNOSIS — Z87891 Personal history of nicotine dependence: Secondary | ICD-10-CM | POA: Insufficient documentation

## 2020-06-09 DIAGNOSIS — Z122 Encounter for screening for malignant neoplasm of respiratory organs: Secondary | ICD-10-CM | POA: Insufficient documentation

## 2020-06-09 NOTE — Progress Notes (Signed)
Virtual Visit via Video Enabled Telemedicine Note   I connected with Erica Gomez on 06/09/20 at 10:45 AM EST by video enabled telemedicine visit and verified that I am speaking with the correct person using two identifiers.   I discussed the limitations, risks, security and privacy concerns of performing an evaluation and management service by telemedicine and the availability of in-person appointments. I also discussed with the patient that there may be a patient responsible charge related to this service. The patient expressed understanding and agreed to proceed.   Other persons participating in the visit and their role in the encounter: Burgess Estelle, RN- checking in patient & navigation  Patient's location: [place]  Provider's location: home  Chief Complaint: Low Dose CT Screening  Patient agreed to evaluation by telemedicine to discuss shared decision making for consideration of low dose CT lung cancer screening.    In accordance with CMS guidelines, patient has met eligibility criteria including age, absence of signs or symptoms of lung cancer.  Social History   Tobacco Use  . Smoking status: Current Every Day Smoker    Packs/day: 1.00    Years: 36.00    Pack years: 36.00    Types: Cigarettes  . Smokeless tobacco: Former Systems developer    Types: Chew, Snuff  . Tobacco comment: Using chew to wean herself off cigarettes  Substance Use Topics  . Alcohol use: No     A shared decision-making session was conducted prior to the performance of CT scan. This includes one or more decision aids, includes benefits and harms of screening, follow-up diagnostic testing, over-diagnosis, false positive rate, and total radiation exposure.   Counseling on the importance of adherence to annual lung cancer LDCT screening, impact of co-morbidities, and ability or willingness to undergo diagnosis and treatment is imperative for compliance of the program.   Counseling on the importance of  continued smoking cessation for former smokers; the importance of smoking cessation for current smokers, and information about tobacco cessation interventions have been given to patient including [place] and [facility] [place] programs.   Written order for lung cancer screening with LDCT has been given to the patient and any and all questions have been answered to the best of my abilities.    Yearly follow up will be coordinated by Burgess Estelle, Thoracic Navigator.  I discussed the assessment and treatment plan with the patient. The patient was provided an opportunity to ask questions and all were answered. The patient agreed with the plan and demonstrated an understanding of the instructions.   The patient was advised to call back or seek an in-person evaluation if the symptoms worsen or if the condition fails to improve as anticipated.   I provided 15 minutes of face-to-face video visit time dedicated to the care of this patient on the date of this encounter to include pre-visit review of smoking history, face-to-face time with the patient, and post visit ordering of testing/documentation.   Beckey Rutter, DNP, AGNP-C [place] [facility] [phone] (clinic)

## 2020-06-16 ENCOUNTER — Telehealth: Payer: Self-pay | Admitting: Pharmacist

## 2020-06-16 NOTE — Telephone Encounter (Signed)
06/16/2020 8:06:16 AM - ProAir refill faxed to [facility] - Wednesday, Jun 16, 2020 8:05 AM --Faxed refill to [facility] for ProAir.

## 2020-06-21 ENCOUNTER — Encounter: Payer: Self-pay | Admitting: *Deleted

## 2020-06-22 ENCOUNTER — Other Ambulatory Visit: Payer: Self-pay | Admitting: Neurology

## 2020-06-22 ENCOUNTER — Other Ambulatory Visit: Payer: Self-pay

## 2020-06-23 ENCOUNTER — Telehealth: Payer: Self-pay | Admitting: Neurology

## 2020-06-23 ENCOUNTER — Encounter: Payer: Self-pay | Admitting: Neurology

## 2020-06-23 ENCOUNTER — Ambulatory Visit (INDEPENDENT_AMBULATORY_CARE_PROVIDER_SITE_OTHER): Payer: Medicaid Other | Admitting: Neurology

## 2020-06-23 DIAGNOSIS — R569 Unspecified convulsions: Secondary | ICD-10-CM

## 2020-06-23 MED ORDER — LEVETIRACETAM 500 MG PO TABS: 500.0000 mg | ORAL_TABLET | Freq: Two times a day (BID) | ORAL | 3 refills | Status: DC

## 2020-06-23 MED ORDER — LAMOTRIGINE 100 MG PO TABS: 100.0000 mg | ORAL_TABLET | Freq: Two times a day (BID) | ORAL | 4 refills | Status: DC

## 2020-06-23 MED ORDER — LEVETIRACETAM 500 MG PO TABS: 500.0000 mg | ORAL_TABLET | Freq: Two times a day (BID) | ORAL | 4 refills | Status: DC

## 2020-06-23 NOTE — Patient Instructions (Signed)
Continue current medications  Call for any seizures Make sure not to miss any doses of the medications

## 2020-06-23 NOTE — Addendum Note (Signed)
Addended by: Darleen Crocker on: 06/23/2020 02:07 PM   Modules accepted: Orders

## 2020-06-23 NOTE — Telephone Encounter (Signed)
Pt's husband has called to inform that the   levETIRAcetam (KEPPRA) 500 MG tablet should have been called into [facility]

## 2020-06-23 NOTE — Telephone Encounter (Signed)
Prescription has been forwarded to the correct pharmacy for the pt

## 2020-06-23 NOTE — Progress Notes (Signed)
Chief Complaint  Patient presents with  . Follow-up    Pt with husband , new room, she has been well over 6 mths seizure free and is wanting to know if she can drive. She also needs refills on medications    HISTORICAL  TREY BEBEE is a 55 year old female, seen in request by her primary care nurse practitioner Hattie Perch, [place] for evaluation of seizure, she is accompanied by her husband at today's clinical visit.  I reviewed and summarized the referring note.  Past medical history Bipolar disorder, on lamotrigine 100 mg twice a day Diabetes Hyperlipidemia  She reported a history of seizure started age at age 59, generalized tonic-clonic shaking, sometimes with loss of consciousness, the other times has whole body shaking but she is aware of surrounding just could not responding to it.  She had multiple recurrent seizure-like event from age [age], then quit having it.  Had recurrent seizure-like event in 2018  Increase the seizure-like event since September 2021, she reported sudden onset body shaking, oftentimes aware of her body tremorous movement, could not respond to surrounding, without loss of consciousness  She had one major episode on December 02, 2019, it happened while she was alone at home, when her husband came back home, noted she has a knot on her forehead," as big as my fist", also bilateral black eyes, she was washing the blood off her face, patient reported sudden onset loss of consciousness, fell off the chair she was sitting on, face down to the porch  Another episode on December 07, 2019, was witnessed by her coworker, she had a body shaking, could not respond to surrounding, face turned red, lasting for few minutes, but she denied loss of consciousness  I personally reviewed CT head without contrast, CT of maxillary facial, left frontal scalp hematoma, no acute intracranial abnormality  Extensive laboratory evaluation in 2021,Lipid panel, LDL 70, cholesterol  139, A1c 6.7, CMP showed mild creatinine 1.17, glucose 139, normal CBC, hemoglobin of 12.8, UDS was negative  She has been on lamotrigine 100 mg twice daily for many years for her bipolar disorder, since emergency presentation December 07, 2019, she was added on Keppra 500 mg twice a day, she tolerated the medication well, there was no recurrent episodes since Keppra was added on  I saw her previously in November 2020 for evaluation of painful bilateral hands muscle spasm, she worked at [employer], which require repetitive hand movement, she has intermittent bilateral hands joints pain, paresthesia since 2018,  EMG nerve conduction study on December 31, 2018 confirmed bilateral carpal tunnel syndromes, right side is moderately severe, left side is moderate, demyelinating in nature, no evidence of axonal loss.  She had right carpal tunnel release surgery on January 15, 2019, Left side in Jan 2021, recovered well. She complains of worsening bilateral wrist pain, could no longer lifting heavy object, she has to leave previous job.  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  I personally reviewed MRI of cervical spine December 2020 showed evidence of multilevel degenerative changes, most obvious at C5-6, marked foraminal stenosis, no significant canal stenosis  Update Jun 23, 2020 SS: Here today accompanied by her husband. Remains on Lamictal 100 mg twice daily, Keppra 500 mg twice daily.  Last seizure-like event was in October 2021.  At that time Keppra was added. Has been out of [place] for the last 4 days, her pharmacy could not get reportedly, she is switching pharmacies, is otherwise compliant.  Tolerating medications well.  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 Claims she has warning sign before seizures, her whole body tremble, 1 minute before loss of consciousness,   EEG was normal in November 2021, Lamictal level was 11.4, Keppra level  was 14.4  MRI of the brain in January 2022 showed no significant abnormalities  REVIEW OF SYSTEMS: Full 14 system review of systems performed and notable only for as above  See HPI  ALLERGIES: Allergies  Allergen Reactions  . Flax Seed Oil [Bio-Flax] Hives    Pt. Self reported    HOME MEDICATIONS: Current Outpatient Medications  Medication Sig Dispense Refill  . albuterol (VENTOLIN HFA) 108 (90 Base) MCG/ACT inhaler INHALE 2 PUFFS EVERY 4 TO 6 HOURS AS NEEDED 25.5 g 1  . aspirin EC 81 MG tablet Take 1 tablet (81 mg total) by mouth daily. No ibuprofen for at least one hour    . Blood Glucose Monitoring Suppl (RIGHTEST GM550 BLOOD GLUCOSE) w/Device KIT USE TO TEST BLOOD GLUCOSE UP TO 4 TIMES PER DAY, IN THE MORNING AND AFTER MEALS 1 kit 1  . citalopram (CELEXA) 20 MG tablet Take 40 mg by mouth daily.    . Fluticasone-Salmeterol (ADVAIR DISKUS) 500-50 MCG/DOSE AEPB Inhale 1 puff into the lungs 2 (two) times daily. 3 each 1  . glucose blood test strip USE TO TEST BLOOD GLUCOSE UP TO 4 TIMES PER DAY, IN THE MORNING AND AFTER MEALS (Patient taking differently: USE TO TEST BLOOD GLUCOSE UP TO 4 TIMES PER DAY, IN THE MORNING AND AFTER MEALS) 100 strip 11  . ipratropium-albuterol (DUONEB) 0.5-2.5 (3) MG/3ML SOLN INHALE CONTENTS OF ONE VIAL USING NEBULIZER EVERY 6 HOURS 360 mL 2  . metFORMIN (GLUCOPHAGE) 500 MG tablet TAKE ONE TABLET BY MOUTH ($RemoveB'500MG'dlLiekhk$ ) 2 TIMES A DAY WITH MEALS 120 tablet 3  . PROAIR HFA 108 (90 Base) MCG/ACT inhaler INHALE 2 PUFFS EVERY 4 TO 6 HOURS AS NEEDED 36 g 11  . Rightest GL300 Lancets MISC USE TO TEST BLOOD GLUCOSE UP TO 4 TIMES PER DAY, IN THE MORNING AND AFTER MEALS 100 each 11  . risperiDONE (RISPERDAL) 1 MG tablet TAKE TWO TABLETS BY MOUTH ($RemoveB'2MG'yFQexxdC$ ) AT BEDTIME 60 tablet 2  . rosuvastatin (CRESTOR) 10 MG tablet TAKE ONE TABLET BY MOUTH EVERY DAY 90 tablet 4  . tiotropium (SPIRIVA HANDIHALER) 18 MCG inhalation capsule Place 1 capsule (18 mcg total) into inhaler and inhale  daily. 30 capsule 11  . traZODone (DESYREL) 100 MG tablet Take 100 mg by mouth at bedtime as needed for sleep.    Marland Kitchen lamoTRIgine (LAMICTAL) 100 MG tablet Take 1 tablet (100 mg total) by mouth 2 (two) times daily. 180 tablet 4  . levETIRAcetam (KEPPRA) 500 MG tablet Take 1 tablet (500 mg total) by mouth 2 (two) times daily. 180 tablet 4   No current facility-administered medications for this visit.    PAST MEDICAL HISTORY: Past Medical History:  Diagnosis Date  . Allergy   . Anxiety   . Asthma   . Bilateral carpal tunnel syndrome 12/31/2018  . Bipolar affective, manic ([place]) 1996  . COPD (chronic obstructive pulmonary disease) ([place])   . Depression   . HLD (hyperlipidemia) 03/12/2018  . Lung nodule, multiple 09/26/2016  . Manic depressive illness ([place]) 1996  . Seizures ([place])   . Substance abuse ([place])   . Tobacco abuse 10/04/2016    PAST SURGICAL HISTORY: Past Surgical History:  Procedure Laterality Date  .  ABDOMINAL HYSTERECTOMY    . CARPAL TUNNEL RELEASE Right 01/15/2019   Procedure: RIGHTCARPAL TUNNEL RELEASE, LEFT DEQUERVAINS INJECTION;  Surgeon: Leandrew Koyanagi, MD;  Location: [place];  Service: Orthopedics;  Laterality: Right;  . CARPAL TUNNEL RELEASE Left 02/26/2019   Procedure: LEFT CARPAL TUNNEL RELEASE;  Surgeon: Leandrew Koyanagi, MD;  Location: [place];  Service: Orthopedics;  Laterality: Left;  Bier block  . COLONOSCOPY WITH PROPOFOL N/A 11/28/2018   Procedure: COLONOSCOPY WITH PROPOFOL;  Surgeon: [name], MD;  [facility] ENDOSCOPY;  Service: Gastroenterology;  Laterality: N/A;  . TUBAL LIGATION      FAMILY HISTORY: Family History  Problem Relation Age of Onset  . Stroke Mother   . Heart attack Father     SOCIAL HISTORY: Social History   Socioeconomic History  . Marital status: Married    Spouse name: Not on file  . Number of children: 2  . Years of education: taking some medical billing classes now  . Highest education  level: Not on file  Occupational History  . Occupation: Caretaker  Tobacco Use  . Smoking status: Current Every Day Smoker    Packs/day: 1.00    Years: 36.00    Pack years: 36.00    Types: Cigarettes  . Smokeless tobacco: Former Systems developer    Types: Chew, Snuff  . Tobacco comment: Using chew to wean herself off cigarettes  Vaping Use  . Vaping Use: Former  Substance and Sexual Activity  . Alcohol use: No  . Drug use: No    Types: "Crack" cocaine    Comment: history 2013  . Sexual activity: Yes    Birth control/protection: Condom, None  Other Topics Concern  . Not on file  Social History Narrative   Lives with her husband.   Right-handed.   Caffeine use: 2 cups daily.   Social Determinants of Health   Financial Resource Strain: Not on file  Food Insecurity: Not on file  Transportation Needs: Not on file  Physical Activity: Not on file  Stress: Not on file  Social Connections: Not on file  Intimate Partner Violence: Not on file   PHYSICAL EXAM   Vitals:   06/23/20 0854  BP: 126/75  Pulse: 82  Weight: 180 lb (81.6 kg)  Height: $Remove'5\' 6"'wARzDoT$  (1.676 m)   Not recorded     Body mass index is 29.05 kg/m.  PHYSICAL EXAMNIATION:  Gen: NAD, conversant, well nourised, well groomed                     Cardiovascular: Regular rate rhythm, no peripheral edema, warm, nontender. Eyes: Conjunctivae clear without exudates or hemorrhage Neck: Supple, no carotid bruits. Pulmonary: Clear to auscultation bilaterally   NEUROLOGICAL EXAM:  MENTAL STATUS: Speech:    Speech is normal; fluent and spontaneous with normal comprehension.  Cognition:     Orientation to time, place and person     Normal recent and remote memory     Normal Attention span and concentration     Normal Language, naming, repeating,spontaneous speech   CRANIAL NERVES: CN II: Visual fields are full to confrontation. Pupils are round equal and briskly reactive to light. CN III, IV, VI: extraocular movement are  normal. No ptosis. CN V: Facial sensation is intact to light touch CN VII: Face is symmetric with normal eye closure  CN VIII: Hearing is normal to causal conversation. CN IX, X: Phonation is normal. CN XI: Head turning and shoulder shrug  are intact  MOTOR: Good strength all extremities  REFLEXES: Reflexes are 2+ throughout SENSORY: Intact to light touch  COORDINATION: Finger-nose-finger and heel-to-shin is normal.  GAIT/STANCE: Gait is normal.  Tandem gait is normal.  DIAGNOSTIC DATA (LABS, IMAGING, TESTING) - I reviewed patient records, labs, notes, testing and imaging myself where available.  ASSESSMENT AND PLAN  Erica Gomez is a 55 y.o. female   1. Seizure-like event  -Last seizure event was in October 2021, Keppra was added at that time, no recurrent episodes since  -Continue Keppra 500 mg twice daily, Lamictal 100 mg twice daily  -Stressed the importance of compliance, not to miss any doses, refills were sent in for 1 year, to new pharmacy  -EEG was normal in November 2021, Lamictal level was 11.4  -MRI of the brain in January 2022 showed no significant abnormalities  -In November 2021 Lamictal level was 11.4, Keppra level was 14.4  -Call for seizure activity, otherwise follow-up 6 months or sooner if needed  I spent 35 minutes of face-to-face and non-face-to-face time with patient.  This included previsit chart review, lab review, discussing importance of compliance with medications, seizure spells, and providing a note for her work.   Evangeline Dakin, DNP  [facility] [address], [place], [place] [address] [phone]

## 2020-06-28 ENCOUNTER — Other Ambulatory Visit: Payer: Self-pay

## 2020-06-28 MED ORDER — ATOMOXETINE HCL 25 MG PO CAPS: ORAL_CAPSULE | ORAL | 1 refills | Status: DC

## 2020-06-29 ENCOUNTER — Other Ambulatory Visit: Payer: Self-pay

## 2020-06-29 MED FILL — Albuterol Sulfate Inhal Aero 108 MCG/ACT (90MCG Base Equiv): RESPIRATORY_TRACT | 51 days supply | Qty: 25.5 | Fill #0 | Status: CN

## 2020-07-06 ENCOUNTER — Other Ambulatory Visit: Payer: Self-pay

## 2020-08-09 ENCOUNTER — Other Ambulatory Visit: Payer: Self-pay

## 2020-08-09 MED FILL — Albuterol Sulfate Inhal Aero 108 MCG/ACT (90MCG Base Equiv): RESPIRATORY_TRACT | 51 days supply | Qty: 25.5 | Fill #0 | Status: CN

## 2020-08-09 MED FILL — Albuterol Sulfate Inhal Aero 108 MCG/ACT (90MCG Base Equiv): RESPIRATORY_TRACT | 50 days supply | Qty: 25.5 | Fill #0 | Status: CN

## 2020-08-11 ENCOUNTER — Encounter: Payer: Self-pay | Admitting: Gerontology

## 2020-08-11 ENCOUNTER — Ambulatory Visit: Payer: Medicaid Other | Admitting: Gerontology

## 2020-08-11 ENCOUNTER — Other Ambulatory Visit: Payer: Self-pay

## 2020-08-11 VITALS — BP 104/60 | HR 100 | Resp 18 | Ht 65.0 in | Wt 176.8 lb

## 2020-08-11 DIAGNOSIS — J439 Emphysema, unspecified: Secondary | ICD-10-CM

## 2020-08-11 DIAGNOSIS — N393 Stress incontinence (female) (male): Secondary | ICD-10-CM

## 2020-08-11 DIAGNOSIS — E119 Type 2 diabetes mellitus without complications: Secondary | ICD-10-CM

## 2020-08-11 MED ORDER — FLUTICASONE-SALMETEROL 500-50 MCG/ACT IN AEPB
1.0000 | INHALATION_SPRAY | Freq: Two times a day (BID) | RESPIRATORY_TRACT | 5 refills | Status: DC
  Filled 2020-08-11: qty 60, 30d supply, fill #0

## 2020-08-11 MED ORDER — IPRATROPIUM-ALBUTEROL 0.5-2.5 (3) MG/3ML IN SOLN
RESPIRATORY_TRACT | 2 refills | Status: DC
  Filled 2020-08-11: qty 360, fill #0

## 2020-08-11 NOTE — Progress Notes (Signed)
Established Patient Office Visit  Subjective:  Patient ID: EMILEIGH KELLETT, female    DOB: Jul 17, 1965  Age: 55 y.o. MRN: [id number]  CC:  Chief Complaint  Patient presents with   Follow-up   Diabetes   Medication Refill   Pruritis    Patient states her back has been itching x years. Patient states the skin is dry, but no rash. Patient has tried Vaseline and various lotions. Patient states Eucerin has helped.    HPI ASHNA DOROUGH is a 55 y/o female who has history of seizure, Allergy, Asthma, Bilateral Carpal Tunnel Syndrome, COP, Hyperlipidemia, Tobacco use, T2DM, presents for routine follow up visit and medication refill. She states that she's compliant with her medications and continues to make healthy lifestyle changes. Her HgbA1c done during visit decreased from 6.5% to 6.1%, and her blood glucose was 126 mg/dl. She denies hypo/hyperglycemic symptoms, peripheral neuropathy and performs daily foot checks. She was seen at the Neurology clinic on 06/23/20 by Butler Denmark J.DNP. She states that she's compliant with her medications and denies any seizure activities. She also c/o urinary incontinence that has been going on for more than a year. She reports leaking urine when she coughs, laughs and sometimes when she's sitting down. She denies urinary frequency, urgency, dysuria, flank and pelvic pain. She states that her breathing is stable, and continues to smoke 1/2 to 1 pack of cigarette daily and admits the desire to quit. She had Low dose Chest CT scan on 06/09/20 and it showed benign appearance or behavior. Continue annual screening with low-dose chest CT without contrast in 12 months. 2. Peribronchovascular ground-glass and nodularity in the anterior left lower lobe, likely infectious or inflammatory in etiology.3. Coronary artery calcification. 4.  Emphysema. Overall, she states that she's doing well and offers no further complaint.  Past Medical History:  Diagnosis Date   Allergy     Anxiety    Asthma    Bilateral carpal tunnel syndrome 12/31/2018   Bipolar affective, manic ([place]) 1996   COPD (chronic obstructive pulmonary disease) (HCC)    Depression    HLD (hyperlipidemia) 03/12/2018   Lung nodule, multiple 09/26/2016   Manic depressive illness ([place]) 1996   Seizures ([place])    Substance abuse ([place])    Tobacco abuse 10/04/2016    Past Surgical History:  Procedure Laterality Date   ABDOMINAL HYSTERECTOMY     CARPAL TUNNEL RELEASE Right 01/15/2019   Procedure: RIGHTCARPAL TUNNEL RELEASE, LEFT DEQUERVAINS INJECTION;  Surgeon: Leandrew Koyanagi, MD;  Location: [place];  Service: Orthopedics;  Laterality: Right;   CARPAL TUNNEL RELEASE Left 02/26/2019   Procedure: LEFT CARPAL TUNNEL RELEASE;  Surgeon: Leandrew Koyanagi, MD;  Location: [place];  Service: Orthopedics;  Laterality: Left;  Bier block   COLONOSCOPY WITH PROPOFOL N/A 11/28/2018   Procedure: COLONOSCOPY WITH PROPOFOL;  Surgeon: [name], MD;  Location: [facility] ENDOSCOPY;  Service: Gastroenterology;  Laterality: N/A;   TUBAL LIGATION      Family History  Problem Relation Age of Onset   Stroke Mother    Other Mother        "blood clot"   Heart attack Father    CAD Father    Bipolar disorder Brother    Schizophrenia Brother    Schizophrenia Brother    Bipolar disorder Brother     Social History   Socioeconomic History   Marital status: Married    Spouse name: Not on file   Number of  children: 2   Years of education: taking some medical billing classes now   Highest education level: Not on file  Occupational History   Occupation: Caretaker  Tobacco Use   Smoking status: Every Day    Packs/day: 0.50    Years: 36.00    Pack years: 18.00    Types: Cigarettes   Smokeless tobacco: Former    Types: Chew, Snuff    Quit date: 2018   Tobacco comments:    Using nicotine lozenges to try and help her quit smoking  Vaping Use   Vaping Use: Former   Quit date: 08/11/2016   Substance and Sexual Activity   Alcohol use: No   Drug use: No    Types: "Crack" cocaine    Comment: history 2013   Sexual activity: Yes    Birth control/protection: Condom, None  Other Topics Concern   Not on file  Social History Narrative   Lives with her husband.   Right-handed.   Caffeine use: 2 cups daily.   Social Determinants of Health   Financial Resource Strain: Not on file  Food Insecurity: No Food Insecurity   Worried About Charity fundraiser in the Last Year: Never true   Ran Out of Food in the Last Year: Never true  Transportation Needs: No Transportation Needs   Lack of Transportation (Medical): No   Lack of Transportation (Non-Medical): No  Physical Activity: Not on file  Stress: Not on file  Social Connections: Not on file  Intimate Partner Violence: Not on file    Outpatient Medications Prior to Visit  Medication Sig Dispense Refill   albuterol (VENTOLIN HFA) 108 (90 Base) MCG/ACT inhaler INHALE 2 PUFFS EVERY 4 TO 6 HOURS AS NEEDED 25.5 g 1   aspirin EC 81 MG tablet Take 1 tablet (81 mg total) by mouth daily. No ibuprofen for at least one hour     atomoxetine (STRATTERA) 25 MG capsule TAKE ONE CAPSULE BY MOUTH ONCE DAILY AFTER DINNER. 30 capsule 1   citalopram (CELEXA) 20 MG tablet Take 40 mg by mouth daily.     lamoTRIgine (LAMICTAL) 100 MG tablet Take 1 tablet (100 mg total) by mouth 2 (two) times daily. 180 tablet 4   levETIRAcetam (KEPPRA) 500 MG tablet Take 1 tablet (500 mg total) by mouth 2 (two) times daily. 180 tablet 3   metFORMIN (GLUCOPHAGE) 500 MG tablet TAKE ONE TABLET BY MOUTH (500MG) 2 TIMES A DAY WITH MEALS 120 tablet 3   risperiDONE (RISPERDAL) 1 MG tablet TAKE TWO TABLETS BY MOUTH (2MG) AT BEDTIME 60 tablet 2   rosuvastatin (CRESTOR) 10 MG tablet TAKE ONE TABLET BY MOUTH EVERY DAY 90 tablet 4   tiotropium (SPIRIVA HANDIHALER) 18 MCG inhalation capsule Place 1 capsule (18 mcg total) into inhaler and inhale daily. 30 capsule 11   traZODone  (DESYREL) 100 MG tablet Take 100 mg by mouth at bedtime as needed for sleep.     UNABLE TO FIND Med Name: Zantrex OTC diet pill     Fluticasone-Salmeterol (ADVAIR DISKUS) 500-50 MCG/DOSE AEPB Inhale 1 puff into the lungs 2 (two) times daily. 3 each 1   ipratropium-albuterol (DUONEB) 0.5-2.5 (3) MG/3ML SOLN INHALE CONTENTS OF ONE VIAL USING NEBULIZER EVERY 6 HOURS 360 mL 2   Blood Glucose Monitoring Suppl (RIGHTEST GM550 BLOOD GLUCOSE) w/Device KIT USE TO TEST BLOOD GLUCOSE UP TO 4 TIMES PER DAY, IN THE MORNING AND AFTER MEALS (Patient not taking: Reported on 08/11/2020) 1 kit 1   glucose  blood test strip USE TO TEST BLOOD GLUCOSE UP TO 4 TIMES PER DAY, IN THE MORNING AND AFTER MEALS (Patient not taking: Reported on 08/11/2020) 100 strip 11   Rightest GL300 Lancets MISC USE TO TEST BLOOD GLUCOSE UP TO 4 TIMES PER DAY, IN THE MORNING AND AFTER MEALS (Patient not taking: Reported on 08/11/2020) 100 each 11   No facility-administered medications prior to visit.    Allergies  Allergen Reactions   Flax Seed Oil [Bio-Flax] Hives    Pt. Self reported    ROS Review of Systems  Constitutional: Negative.   Respiratory: Negative.    Cardiovascular: Negative.   Endocrine: Negative.   Genitourinary:  Negative for difficulty urinating, dysuria, flank pain, frequency, pelvic pain and urgency.  Skin: Negative.   Neurological: Negative.   Psychiatric/Behavioral: Negative.       Objective:    Physical Exam HENT:     Head: Normocephalic and atraumatic.  Cardiovascular:     Rate and Rhythm: Normal rate and regular rhythm.     Pulses: Normal pulses.     Heart sounds: Normal heart sounds.  Pulmonary:     Effort: Pulmonary effort is normal.     Breath sounds: Normal breath sounds.  Abdominal:     General: Bowel sounds are normal.     Palpations: Abdomen is soft.  Skin:    General: Skin is warm.  Neurological:     General: No focal deficit present.     Mental Status: She is alert and oriented to  person, place, and time. Mental status is at baseline.  Psychiatric:        Mood and Affect: Mood normal.        Behavior: Behavior normal.        Thought Content: Thought content normal.        Judgment: Judgment normal.    BP 104/60 (BP Location: Right Arm, Patient Position: Sitting, Cuff Size: Large) Comment: manual  Pulse 100   Resp 18   Ht _0  (1.651 m)   Wt 176 lb 12.8 oz (80.2 kg)   SpO2 97%   BMI 29.42 kg/m  Wt Readings from Last 3 Encounters:  08/11/20 176 lb 12.8 oz (80.2 kg)  06/23/20 180 lb (81.6 kg)  06/09/20 175 lb (79.4 kg)     Health Maintenance Due  Topic Date Due   COVID-19 Vaccine (1) Never done   Pneumococcal Vaccine 46-28 Years old (1 - PCV) Never done   OPHTHALMOLOGY EXAM  Never done   Hepatitis C Screening  Never done   TETANUS/TDAP  Never done   Zoster Vaccines- Shingrix (1 of 2) Never done   PAP SMEAR-Modifier  Never done    There are no preventive care reminders to display for this patient.  Lab Results  Component Value Date   TSH 1.030 05/11/2016   Lab Results  Component Value Date   WBC 9.8 12/07/2019   HGB 12.8 12/07/2019   HCT 40.4 12/07/2019   MCV 93.3 12/07/2019   PLT 279 12/07/2019   Lab Results  Component Value Date   NA 142 05/12/2020   K 4.6 05/12/2020   CO2 22 05/12/2020   GLUCOSE 114 (H) 05/12/2020   BUN 9 05/12/2020   CREATININE 1.03 (H) 05/12/2020   BILITOT <0.2 05/12/2020   ALKPHOS 77 05/12/2020   AST 14 05/12/2020   ALT 17 05/12/2020   PROT 6.6 05/12/2020   ALBUMIN 4.5 05/12/2020   CALCIUM 9.6 05/12/2020   ANIONGAP 11 12/07/2019  EGFR 65 05/12/2020   Lab Results  Component Value Date   CHOL 139 12/24/2019   Lab Results  Component Value Date   HDL 48 12/24/2019   Lab Results  Component Value Date   LDLCALC 70 12/24/2019   Lab Results  Component Value Date   TRIG 114 12/24/2019   Lab Results  Component Value Date   CHOLHDL 2.9 12/24/2019   Lab Results  Component Value Date   HGBA1C 6.5  (A) 05/11/2020   HGBA1C 6.5 05/11/2020   HGBA1C 0 (A) 05/11/2020   HGBA1C 0.0 05/11/2020      Assessment & Plan:    1. Type 2 diabetes mellitus without complication, without long-term current use of insulin (HCC) -Her HgbA1c was 6.1%,and she will continue on current treatment regimen, low carb/non concentrated sweet diet and exercise as tolerated. - POCT HgB A1C; Future - POCT Glucose (CBG); Future  2. Pulmonary emphysema, unspecified emphysema type ([place]) -Her breathing is stable, she will continue on current treatment regimen, educated on smoking cessation. She was provided with [place] Quitline information. - ipratropium-albuterol (DUONEB) 0.5-2.5 (3) MG/3ML SOLN; INHALE CONTENTS OF ONE VIAL (3ML TOTAL) USING NEBULIZER  ONCE EVERY 6 HOURS.  Dispense: 360 mL; Refill: 2 - fluticasone-salmeterol (ADVAIR) 500-50 MCG/ACT AEPB; Inhale 1 puff into the lungs in the morning and at bedtime.  Dispense: 60 each; Refill: 5  3. Urinary, incontinence, stress female -Will check Urine to rule out UTI and she will continue to practice Kegel exercise and notify the clinic for worsening symptoms. - UA/M w/rflx Culture, Routine; Future - UA/M w/rflx Culture, Routine     Follow-up: Return in about 5 weeks (around 09/15/2020), or if symptoms worsen or fail to improve.    Sacoya Mcgourty Jerold Coombe, NP

## 2020-08-11 NOTE — Patient Instructions (Addendum)
Kegel Exercises  Kegel exercises can help strengthen your pelvic floor muscles. The pelvic floor is a group of muscles that support your rectum, small intestine, and bladder. In females, pelvic floor muscles also help support the womb (uterus). These muscles help you control the flow of urine and stool. Kegel exercises are painless and simple, and they do not require any equipment. Your provider may suggest Kegel exercises to: Improve bladder and bowel control. Improve sexual response. Improve weak pelvic floor muscles after surgery to remove the uterus (hysterectomy) or pregnancy (females). Improve weak pelvic floor muscles after prostate gland removal or surgery (males). Kegel exercises involve squeezing your pelvic floor muscles, which are the same muscles you squeeze when you try to stop the flow of urine or keep from passing gas. The exercises can be done while sitting, standing, or lying down, but itis best to vary your position. Exercises How to do Kegel exercises: Squeeze your pelvic floor muscles tight. You should feel a tight lift in your rectal area. If you are a female, you should also feel a tightness in your vaginal area. Keep your stomach, buttocks, and legs relaxed. Hold the muscles tight for up to 10 seconds. Breathe normally. Relax your muscles. Repeat as told by your health care provider. Repeat this exercise daily as told by your health care provider. Continue to do this exercise for at least 4-6 weeks, or for as long as told by your healthcare provider. You may be referred to a physical therapist who can help you learn more abouthow to do Kegel exercises. Depending on your condition, your health care provider may recommend: Varying how long you squeeze your muscles. Doing several sets of exercises every day. Doing exercises for several weeks. Making Kegel exercises a part of your regular exercise routine. This information is not intended to replace advice given to you by  your health care provider. Make sure you discuss any questions you have with your healthcare provider. Document Revised: 01/21/2020 Document Reviewed: 09/19/2017 Elsevier Patient Education  [place]. https://www.diabeteseducator.org/docs/default-source/living-with-diabetes/conquering-the-grocery-store-v1.pdf?sfvrsn=4">  Carbohydrate Counting for Diabetes Mellitus, Adult Carbohydrate counting is a method of keeping track of how many carbohydrates you eat. Eating carbohydrates naturally increases the amount of sugar (glucose) in the blood. Counting how many carbohydrates you eat improves your bloodglucose control, which helps you manage your diabetes. It is important to know how many carbohydrates you can safely have in each meal. This is different for every person. A dietitian can help you make a meal plan and calculate how many carbohydrates you should have at each meal andsnack. What foods contain carbohydrates? Carbohydrates are found in the following foods: Grains, such as breads and cereals. Dried beans and soy products. Starchy vegetables, such as potatoes, peas, and corn. Fruit and fruit juices. Milk and yogurt. Sweets and snack foods, such as cake, cookies, candy, chips, and soft drinks. How do I count carbohydrates in foods? There are two ways to count carbohydrates in food. You can read food labels or learn standard serving sizes of foods. You can use either of the methods or acombination of both. Using the Nutrition Facts label The Nutrition Facts list is included on the labels of almost all packaged foods and beverages in the [place] It includes: The serving size. Information about nutrients in each serving, including the grams (g) of carbohydrate per serving. To use the Nutrition Facts: Decide how many servings you will have. Multiply the number of servings by the number of carbohydrates per serving. The resulting number  is the total amount of carbohydrates that you will be  having. Learning the standard serving sizes of foods When you eat carbohydrate foods that are not packaged or do not include Nutrition Facts on the label, you need to measure the servings in order to count the amount of carbohydrates. Measure the foods that you will eat with a food scale or measuring cup, if needed. Decide how many standard-size servings you will eat. Multiply the number of servings by 15. For foods that contain carbohydrates, one serving equals 15 g of carbohydrates. For example, if you eat 2 cups or 10 oz (300 g) of strawberries, you will have eaten 2 servings and 30 g of carbohydrates (2 servings x 15 g = 30 g). For foods that have more than one food mixed, such as soups and casseroles, you must count the carbohydrates in each food that is included. The following list contains standard serving sizes of common carbohydrate-rich foods. Each of these servings has about 15 g of carbohydrates: 1 slice of bread. 1 six-inch (15 cm) tortilla. ? cup or 2 oz (53 g) cooked rice or pasta.  cup or 3 oz (85 g) cooked or canned, drained and rinsed beans or lentils.  cup or 3 oz (85 g) starchy vegetable, such as peas, corn, or squash.  cup or 4 oz (120 g) hot cereal.  cup or 3 oz (85 g) boiled or mashed potatoes, or  or 3 oz (85 g) of a large baked potato.  cup or 4 fl oz (118 mL) fruit juice. 1 cup or 8 fl oz (237 mL) milk. 1 small or 4 oz (106 g) apple.  or 2 oz (63 g) of a medium banana. 1 cup or 5 oz (150 g) strawberries. 3 cups or 1 oz (24 g) popped popcorn. What is an example of carbohydrate counting? To calculate the number of carbohydrates in this sample meal, follow the stepsshown below. Sample meal 3 oz (85 g) chicken breast. ? cup or 4 oz (106 g) brown rice.  cup or 3 oz (85 g) corn. 1 cup or 8 fl oz (237 mL) milk. 1 cup or 5 oz (150 g) strawberries with sugar-free whipped topping. Carbohydrate calculation Identify the foods that contain  carbohydrates: Rice. Corn. Milk. Strawberries. Calculate how many servings you have of each food: 2 servings rice. 1 serving corn. 1 serving milk. 1 serving strawberries. Multiply each number of servings by 15 g: 2 servings rice x 15 g = 30 g. 1 serving corn x 15 g = 15 g. 1 serving milk x 15 g = 15 g. 1 serving strawberries x 15 g = 15 g. Add together all of the amounts to find the total grams of carbohydrates eaten: 30 g + 15 g + 15 g + 15 g = 75 g of carbohydrates total. What are tips for following this plan? Shopping Develop a meal plan and then make a shopping list. Buy fresh and frozen vegetables, fresh and frozen fruit, dairy, eggs, beans, lentils, and whole grains. Look at food labels. Choose foods that have more fiber and less sugar. Avoid processed foods and foods with added sugars. Meal planning Aim to have the same amount of carbohydrates at each meal and for each snack time. Plan to have regular, balanced meals and snacks. Where to find more information American Diabetes Association: www.diabetes.org Centers for Disease Control and Prevention: http://www.wolf.info/ Summary Carbohydrate counting is a method of keeping track of how many carbohydrates you eat. Eating carbohydrates  naturally increases the amount of sugar (glucose) in the blood. Counting how many carbohydrates you eat improves your blood glucose control, which helps you manage your diabetes. A dietitian can help you make a meal plan and calculate how many carbohydrates you should have at each meal and snack. This information is not intended to replace advice given to you by your health care provider. Make sure you discuss any questions you have with your healthcare provider. Document Revised: 01/30/2019 Document Reviewed: 01/31/2019 Elsevier Patient Education  2021 [employer].

## 2020-08-17 ENCOUNTER — Other Ambulatory Visit: Payer: Self-pay

## 2020-08-17 ENCOUNTER — Other Ambulatory Visit: Payer: Self-pay | Admitting: Gerontology

## 2020-08-17 DIAGNOSIS — N390 Urinary tract infection, site not specified: Secondary | ICD-10-CM

## 2020-08-17 MED ORDER — NITROFURANTOIN MONOHYD MACRO 100 MG PO CAPS
100.0000 mg | ORAL_CAPSULE | Freq: Two times a day (BID) | ORAL | 0 refills | Status: DC
  Filled 2020-08-17: qty 14, 7d supply, fill #0

## 2020-08-20 ENCOUNTER — Other Ambulatory Visit: Payer: Self-pay

## 2020-08-20 LAB — MICROSCOPIC EXAMINATION
Casts: NONE SEEN /lpf
Epithelial Cells (non renal): >10 /hpf — AB (ref 0–10)
RBC, Urine: NONE SEEN /hpf (ref 0–2)

## 2020-08-20 LAB — UA/M W/RFLX CULTURE, ROUTINE
Bilirubin, UA: NEGATIVE
Glucose, UA: NEGATIVE
Ketones, UA: NEGATIVE
Nitrite, UA: POSITIVE — AB
Protein,UA: NEGATIVE
Specific Gravity, UA: 1.017 (ref 1.005–1.030)
Urobilinogen, Ur: 0.2 mg/dL (ref 0.2–1.0)
pH, UA: 5.5 (ref 5.0–7.5)

## 2020-08-20 LAB — URINE CULTURE, REFLEX

## 2020-08-23 ENCOUNTER — Other Ambulatory Visit: Payer: Self-pay

## 2020-08-26 ENCOUNTER — Other Ambulatory Visit: Payer: Self-pay

## 2020-09-03 ENCOUNTER — Other Ambulatory Visit: Payer: Self-pay

## 2020-09-06 ENCOUNTER — Other Ambulatory Visit: Payer: No Typology Code available for payment source

## 2020-09-08 ENCOUNTER — Other Ambulatory Visit: Payer: Self-pay

## 2020-09-15 ENCOUNTER — Ambulatory Visit: Payer: Medicaid Other | Admitting: Gerontology

## 2020-09-23 ENCOUNTER — Encounter: Payer: Self-pay | Admitting: Gerontology

## 2020-09-23 ENCOUNTER — Ambulatory Visit: Payer: Medicaid Other | Admitting: Gerontology

## 2020-09-23 ENCOUNTER — Other Ambulatory Visit: Payer: Self-pay

## 2020-09-23 VITALS — BP 120/79 | HR 97 | Temp 97.0°F | Resp 18 | Ht 67.0 in | Wt 176.0 lb

## 2020-09-23 DIAGNOSIS — N393 Stress incontinence (female) (male): Secondary | ICD-10-CM

## 2020-09-23 NOTE — Progress Notes (Signed)
Established Patient Office Visit  Subjective:  Patient ID: Erica Gomez, female    DOB: 1965/09/05  Age: 55 y.o. MRN: [id number]  CC:  Chief Complaint  Patient presents with   Follow-up   Diabetes    Patient has not been checking her blood sugars at home. She states she doesn't know how to use her meter. I advised to bring it with her and I will help her. Patient states she has been taking DM medications as prescribed.   Urinary Incontinence    Patient states she has had urinary incontinence and urgency x 1-2 years. Patient states she has to wear a pad all the time. Patient was treated for a UTI on 08/17/20. Patient states she finished her complete course of antibiotics.    HPI Erica Gomez is a 55 y/o female who has history of seizure, Allergy, Asthma, Bilateral Carpal Tunnel Syndrome, COP, Hyperlipidemia, Tobacco use, T2DM, presents for routine follow up visit. She completed the course of antibiotics for UTI secondary to E coli on 08/11/20. Currently, she continues to c/o urinary urgency and mild incontinence and wears pad. She denies dysuria, urinary frequency, fever, chills, flank and pelvic pain. She states that she's compliant with her medications, denies side effects and continues to make healthy lifestyle changes. Overall, she states that she's doing well and offers no further complaint.  Past Medical History:  Diagnosis Date   Allergy    Anxiety    Asthma    Bilateral carpal tunnel syndrome 12/31/2018   Bipolar affective, manic ([place]) 1996   COPD (chronic obstructive pulmonary disease) (HCC)    Depression    HLD (hyperlipidemia) 03/12/2018   Lung nodule, multiple 09/26/2016   Manic depressive illness ([place]) 1996   Seizures ([place])    Substance abuse ([place])    Tobacco abuse 10/04/2016    Past Surgical History:  Procedure Laterality Date   ABDOMINAL HYSTERECTOMY     CARPAL TUNNEL RELEASE Right 01/15/2019   Procedure: RIGHTCARPAL TUNNEL RELEASE, LEFT DEQUERVAINS INJECTION;   Surgeon: Leandrew Koyanagi, MD;  Location: [place];  Service: Orthopedics;  Laterality: Right;   CARPAL TUNNEL RELEASE Left 02/26/2019   Procedure: LEFT CARPAL TUNNEL RELEASE;  Surgeon: Leandrew Koyanagi, MD;  Location: [place];  Service: Orthopedics;  Laterality: Left;  Bier block   COLONOSCOPY WITH PROPOFOL N/A 11/28/2018   Procedure: COLONOSCOPY WITH PROPOFOL;  Surgeon: [name], MD;  Location: [facility] ENDOSCOPY;  Service: Gastroenterology;  Laterality: N/A;   TUBAL LIGATION      Family History  Problem Relation Age of Onset   Stroke Mother    Other Mother        "blood clot"   Heart attack Father    CAD Father    Bipolar disorder Brother    Schizophrenia Brother    Schizophrenia Brother    Bipolar disorder Brother     Social History   Socioeconomic History   Marital status: Married    Spouse name: Not on file   Number of children: 2   Years of education: taking some medical billing classes now   Highest education level: Not on file  Occupational History   Occupation: Caretaker  Tobacco Use   Smoking status: Every Day    Packs/day: 0.50    Years: 36.00    Pack years: 18.00    Types: Cigarettes   Smokeless tobacco: Former    Types: Chew, Snuff    Quit date: 2018   Tobacco  comments:    Using nicotine lozenges to try and help her quit smoking  Vaping Use   Vaping Use: Former   Quit date: 08/11/2016  Substance and Sexual Activity   Alcohol use: No   Drug use: No    Types: "Crack" cocaine    Comment: history 2013   Sexual activity: Yes    Birth control/protection: Condom, None  Other Topics Concern   Not on file  Social History Narrative   Lives with her husband.   Right-handed.   Caffeine use: 2 cups daily.   Social Determinants of Health   Financial Resource Strain: Not on file  Food Insecurity: No Food Insecurity   Worried About Charity fundraiser in the Last Year: Never true   Ran Out of Food in the Last Year: Never true   Transportation Needs: No Transportation Needs   Lack of Transportation (Medical): No   Lack of Transportation (Non-Medical): No  Physical Activity: Not on file  Stress: Not on file  Social Connections: Not on file  Intimate Partner Violence: Not on file    Outpatient Medications Prior to Visit  Medication Sig Dispense Refill   albuterol (VENTOLIN HFA) 108 (90 Base) MCG/ACT inhaler INHALE 2 PUFFS EVERY 4 TO 6 HOURS AS NEEDED 25.5 g 1   aspirin EC 81 MG tablet Take 1 tablet (81 mg total) by mouth daily. No ibuprofen for at least one hour     atomoxetine (STRATTERA) 25 MG capsule TAKE ONE CAPSULE BY MOUTH ONCE DAILY AFTER DINNER. 30 capsule 1   citalopram (CELEXA) 20 MG tablet Take 40 mg by mouth daily.     fluticasone-salmeterol (ADVAIR) 500-50 MCG/ACT AEPB Inhale 1 puff into the lungs in the morning and at bedtime. 60 each 5   ipratropium-albuterol (DUONEB) 0.5-2.5 (3) MG/3ML SOLN INHALE CONTENTS OF ONE VIAL (3ML TOTAL) USING NEBULIZER  ONCE EVERY 6 HOURS. 360 mL 2   lamoTRIgine (LAMICTAL) 100 MG tablet Take 1 tablet (100 mg total) by mouth 2 (two) times daily. 180 tablet 4   levETIRAcetam (KEPPRA) 500 MG tablet Take 1 tablet (500 mg total) by mouth 2 (two) times daily. 180 tablet 3   metFORMIN (GLUCOPHAGE) 500 MG tablet TAKE ONE TABLET BY MOUTH (500MG) 2 TIMES A DAY WITH MEALS 120 tablet 3   risperiDONE (RISPERDAL) 1 MG tablet TAKE TWO TABLETS BY MOUTH (2MG) AT BEDTIME 60 tablet 2   rosuvastatin (CRESTOR) 10 MG tablet TAKE ONE TABLET BY MOUTH EVERY DAY 90 tablet 4   tiotropium (SPIRIVA HANDIHALER) 18 MCG inhalation capsule Place 1 capsule (18 mcg total) into inhaler and inhale daily. 30 capsule 11   traZODone (DESYREL) 100 MG tablet Take 100 mg by mouth at bedtime as needed for sleep.     UNABLE TO FIND Med Name: Zantrex OTC diet pill     Blood Glucose Monitoring Suppl (RIGHTEST GM550 BLOOD GLUCOSE) w/Device KIT USE TO TEST BLOOD GLUCOSE UP TO 4 TIMES PER DAY, IN THE MORNING AND AFTER  MEALS (Patient not taking: No sig reported) 1 kit 1   glucose blood test strip USE TO TEST BLOOD GLUCOSE UP TO 4 TIMES PER DAY, IN THE MORNING AND AFTER MEALS (Patient not taking: No sig reported) 100 strip 11   Rightest GL300 Lancets MISC USE TO TEST BLOOD GLUCOSE UP TO 4 TIMES PER DAY, IN THE MORNING AND AFTER MEALS (Patient not taking: No sig reported) 100 each 11   nitrofurantoin, macrocrystal-monohydrate, (MACROBID) 100 MG capsule Take 1 capsule (100  mg total) by mouth 2 (two) times daily. 14 capsule 0   No facility-administered medications prior to visit.    Allergies  Allergen Reactions   Flax Seed Oil [Bio-Flax] Hives    Pt. Self reported    ROS Review of Systems  Constitutional: Negative.   Respiratory: Negative.    Cardiovascular: Negative.   Genitourinary:  Positive for urgency. Negative for difficulty urinating, dysuria, flank pain and frequency.  Neurological: Negative.      Objective:    Physical Exam HENT:     Head: Normocephalic and atraumatic.     Mouth/Throat:     Mouth: Mucous membranes are moist.  Eyes:     Extraocular Movements: Extraocular movements intact.     Conjunctiva/sclera: Conjunctivae normal.     Pupils: Pupils are equal, round, and reactive to light.  Cardiovascular:     Rate and Rhythm: Normal rate and regular rhythm.     Pulses: Normal pulses.     Heart sounds: Normal heart sounds.  Pulmonary:     Effort: Pulmonary effort is normal.     Breath sounds: Normal breath sounds.  Abdominal:     Tenderness: There is no right CVA tenderness or left CVA tenderness.  Skin:    General: Skin is warm.  Neurological:     General: No focal deficit present.     Mental Status: She is alert and oriented to person, place, and time. Mental status is at baseline.  Psychiatric:        Mood and Affect: Mood normal.        Behavior: Behavior normal.        Thought Content: Thought content normal.        Judgment: Judgment normal.    BP 120/79 (BP  Location: Right Arm, Patient Position: Sitting, Cuff Size: Large)   Pulse 97   Temp (!) 97 F (36.1 C)   Resp 18   Ht 5' 7" (1.702 m)   Wt 176 lb (79.8 kg)   SpO2 93%   BMI 27.57 kg/m  Wt Readings from Last 3 Encounters:  09/23/20 176 lb (79.8 kg)  08/11/20 176 lb 12.8 oz (80.2 kg)  06/23/20 180 lb (81.6 kg)     Health Maintenance Due  Topic Date Due   COVID-19 Vaccine (1) Never done   OPHTHALMOLOGY EXAM  Never done   Hepatitis C Screening  Never done   TETANUS/TDAP  Never done   Zoster Vaccines- Shingrix (1 of 2) Never done   PAP SMEAR-Modifier  Never done   Pneumococcal Vaccine 45-42 Years old (2 - PCV) 01/01/2020   INFLUENZA VACCINE  09/13/2020    There are no preventive care reminders to display for this patient.  Lab Results  Component Value Date   TSH 1.030 05/11/2016   Lab Results  Component Value Date   WBC 9.8 12/07/2019   HGB 12.8 12/07/2019   HCT 40.4 12/07/2019   MCV 93.3 12/07/2019   PLT 279 12/07/2019   Lab Results  Component Value Date   NA 142 05/12/2020   K 4.6 05/12/2020   CO2 22 05/12/2020   GLUCOSE 114 (H) 05/12/2020   BUN 9 05/12/2020   CREATININE 1.03 (H) 05/12/2020   BILITOT <0.2 05/12/2020   ALKPHOS 77 05/12/2020   AST 14 05/12/2020   ALT 17 05/12/2020   PROT 6.6 05/12/2020   ALBUMIN 4.5 05/12/2020   CALCIUM 9.6 05/12/2020   ANIONGAP 11 12/07/2019   EGFR 65 05/12/2020   Lab Results  Component Value Date   CHOL 139 12/24/2019   Lab Results  Component Value Date   HDL 48 12/24/2019   Lab Results  Component Value Date   LDLCALC 70 12/24/2019   Lab Results  Component Value Date   TRIG 114 12/24/2019   Lab Results  Component Value Date   CHOLHDL 2.9 12/24/2019   Lab Results  Component Value Date   HGBA1C 6.5 (A) 05/11/2020   HGBA1C 6.5 05/11/2020   HGBA1C 0 (A) 05/11/2020   HGBA1C 0.0 05/11/2020      Assessment & Plan:    1. Urinary, incontinence, stress female -Will recheck urine to rule out recurrent  UTI. She was advised to complete [facility] for Urogynecology consult. - UA/M w/rflx Culture, Routine - Ambulatory referral to Urogynecology    Follow-up: Return in about 13 weeks (around 12/23/2020), or if symptoms worsen or fail to improve.    Ryot Burrous Jerold Coombe, NP

## 2020-09-24 LAB — UA/M W/RFLX CULTURE, ROUTINE
Bilirubin, UA: NEGATIVE
Glucose, UA: NEGATIVE
Ketones, UA: NEGATIVE
Leukocytes,UA: NEGATIVE
Nitrite, UA: NEGATIVE
Protein,UA: NEGATIVE
RBC, UA: NEGATIVE
Specific Gravity, UA: 1.017 (ref 1.005–1.030)
Urobilinogen, Ur: 0.2 mg/dL (ref 0.2–1.0)
pH, UA: 5.5 (ref 5.0–7.5)

## 2020-09-24 LAB — MICROSCOPIC EXAMINATION
Bacteria, UA: NONE SEEN
Casts: NONE SEEN /lpf
RBC, Urine: NONE SEEN /hpf (ref 0–2)

## 2020-09-28 ENCOUNTER — Other Ambulatory Visit: Payer: Self-pay

## 2020-09-28 MED FILL — Albuterol Sulfate Inhal Aero 108 MCG/ACT (90MCG Base Equiv): RESPIRATORY_TRACT | 51 days supply | Qty: 25.5 | Fill #0 | Status: AC

## 2020-09-30 ENCOUNTER — Telehealth: Payer: Self-pay | Admitting: Pharmacy Technician

## 2020-09-30 NOTE — Telephone Encounter (Signed)
Received updated proof of income.  Patient eligible to receive medication assistance at [facility] until time for re-certification in 2023, and as long as eligibility requirements continue to be met.  Katrena Stehlin J. Keiasia Christianson Care Manager [facility]  

## 2020-10-04 ENCOUNTER — Other Ambulatory Visit: Payer: Self-pay

## 2020-10-05 ENCOUNTER — Other Ambulatory Visit: Payer: Self-pay

## 2020-10-12 ENCOUNTER — Other Ambulatory Visit: Payer: Self-pay

## 2020-10-12 MED ORDER — LAMOTRIGINE 100 MG PO TABS: ORAL_TABLET | ORAL | 0 refills | Status: DC

## 2020-10-12 MED ORDER — CITALOPRAM HYDROBROMIDE 40 MG PO TABS: ORAL_TABLET | ORAL | 0 refills | Status: DC

## 2020-10-26 ENCOUNTER — Other Ambulatory Visit: Payer: Self-pay

## 2020-10-26 MED ORDER — ARIPIPRAZOLE 5 MG PO TABS
ORAL_TABLET | ORAL | 1 refills | Status: DC
  Filled 2020-11-02: qty 30, 30d supply, fill #0
  Filled 2020-12-21: qty 30, 30d supply, fill #1

## 2020-10-26 MED ORDER — CITALOPRAM HYDROBROMIDE 40 MG PO TABS: 40.0000 mg | ORAL_TABLET | Freq: Every day | ORAL | 1 refills | Status: DC

## 2020-10-26 MED ORDER — ATOMOXETINE HCL 25 MG PO CAPS: 25.0000 mg | ORAL_CAPSULE | Freq: Every evening | ORAL | 1 refills | Status: DC

## 2020-10-26 MED ORDER — LAMOTRIGINE 100 MG PO TABS
100.0000 mg | ORAL_TABLET | Freq: Every day | ORAL | 1 refills | Status: DC
  Filled 2020-10-26: qty 30, 30d supply, fill #0

## 2020-10-27 ENCOUNTER — Other Ambulatory Visit: Payer: Self-pay

## 2020-11-01 ENCOUNTER — Other Ambulatory Visit: Payer: Self-pay

## 2020-11-02 ENCOUNTER — Other Ambulatory Visit: Payer: Self-pay

## 2020-11-02 ENCOUNTER — Other Ambulatory Visit: Payer: Self-pay | Admitting: Gerontology

## 2020-11-02 DIAGNOSIS — J439 Emphysema, unspecified: Secondary | ICD-10-CM

## 2020-11-02 DIAGNOSIS — Z0000 Encounter for general adult medical examination without abnormal findings: Secondary | ICD-10-CM

## 2020-11-02 MED ORDER — FLUTICASONE-SALMETEROL 500-50 MCG/ACT IN AEPB
1.0000 | INHALATION_SPRAY | Freq: Two times a day (BID) | RESPIRATORY_TRACT | 5 refills | Status: DC
  Filled 2020-11-02 – 2020-11-23 (×2): qty 60, 30d supply, fill #0
  Filled 2020-11-24: qty 180, 90d supply, fill #0
  Filled ????-??-??: fill #1

## 2020-11-02 MED ORDER — SPIRIVA HANDIHALER 18 MCG IN CAPS
18.0000 ug | ORAL_CAPSULE | Freq: Every day | RESPIRATORY_TRACT | 11 refills | Status: DC
  Filled 2020-11-02: qty 30, 30d supply, fill #0

## 2020-11-02 MED ORDER — ROSUVASTATIN CALCIUM 10 MG PO TABS
ORAL_TABLET | Freq: Every day | ORAL | 1 refills | Status: DC
  Filled 2020-11-02: qty 30, 30d supply, fill #0
  Filled 2020-12-21: qty 30, 30d supply, fill #1
  Filled 2021-01-25: qty 30, 30d supply, fill #2
  Filled 2021-02-26: qty 30, 30d supply, fill #3

## 2020-11-02 MED ORDER — ALBUTEROL SULFATE HFA 108 (90 BASE) MCG/ACT IN AERS
INHALATION_SPRAY | RESPIRATORY_TRACT | 1 refills | Status: DC
  Filled 2020-11-02: qty 25.5, fill #0
  Filled 2020-12-01: qty 25.5, 51d supply, fill #0
  Filled ????-??-??: fill #1

## 2020-11-02 MED ORDER — METFORMIN HCL 500 MG PO TABS
ORAL_TABLET | ORAL | 1 refills | Status: DC
  Filled 2020-11-02: qty 60, 30d supply, fill #0
  Filled 2020-12-21: qty 60, 30d supply, fill #1
  Filled 2021-01-25: qty 60, 30d supply, fill #2
  Filled 2021-02-26: qty 60, 30d supply, fill #3

## 2020-11-02 MED ORDER — IPRATROPIUM-ALBUTEROL 0.5-2.5 (3) MG/3ML IN SOLN
RESPIRATORY_TRACT | 2 refills | Status: AC
  Filled 2020-11-02: qty 360, fill #0

## 2020-11-03 ENCOUNTER — Telehealth: Payer: Self-pay | Admitting: Pharmacist

## 2020-11-03 ENCOUNTER — Other Ambulatory Visit: Payer: Self-pay

## 2020-11-03 NOTE — Telephone Encounter (Signed)
forms to pt & dr  -- Elmer Picker - Wednesday, November 03, 2020 12:01 PM -- Received pharmacy printouts from [employer] [facility] on the following:  ProAir HFA Inhale 2 puffs into the lungs every 4 to 6 hours as needed  # 3 Spiriva 30mcg Inhale 1 capsule into inhaler once daily # 90 Advair 500/50 Inhale 1 puff into the lungs in the morning and at bedtime # 3 Mailing patient her portion to sign & return, also send to [facility] for Benjamine Mola to sign & return.

## 2020-11-03 NOTE — Telephone Encounter (Signed)
--   Elmer Picker - Wednesday, November 03, 2020 3:38 PM --I have received to signed provider portion of applications/scripts for ProAir, [place] for patient to return her portion, just mailed to patient today 11/03/20

## 2020-11-15 ENCOUNTER — Telehealth: Payer: Self-pay | Admitting: Pharmacist

## 2020-11-15 ENCOUNTER — Other Ambulatory Visit: Payer: Self-pay

## 2020-11-15 NOTE — Telephone Encounter (Signed)
--   Elmer Picker - Monday, November 15, 2020 11:34 AM --Received pharmacy printout for Strattera 25mg  Take 1 capsule by mouth every evening after dinner. Mailing forms to patient to sign & return, also to Dr. Allene Pyo @ [facility] ATTN: Office Manager for provider to sign & return.

## 2020-11-15 NOTE — Telephone Encounter (Signed)
--   Erica Gomez - Monday, November 15, 2020 11:31 AM --Faxed [employer] application for enrollment for Advair 500/50 Inhale 1 puff into the lungs in the morning and at bedtime.   -- Erica Gomez - Monday, November 15, 2020 11:32 AM --[name] application for enrollment for Spiriva 37mcg Inhale 1 caspule into the inhaler and inhale once daily.   -- Erica Gomez - Monday, November 15, 2020 11:33 AM --[name] refill form for ProAir HFA Inhale 2 puffs every 4 hours.

## 2020-11-23 ENCOUNTER — Other Ambulatory Visit: Payer: Self-pay

## 2020-11-24 ENCOUNTER — Other Ambulatory Visit: Payer: Self-pay

## 2020-12-01 ENCOUNTER — Other Ambulatory Visit: Payer: Self-pay

## 2020-12-20 NOTE — Progress Notes (Deleted)
Urogynecology New Patient Evaluation and Consultation  Referring Provider: Langston Reusing, NP PCP: Langston Reusing, NP Date of Service: 12/22/2020  SUBJECTIVE Chief Complaint: No chief complaint on file.  History of Present Illness: Erica Gomez is a 55 y.o. Black or African-American female seen in consultation at the request of NP [place] for evaluation of incontinence.    Review of records from NP [place] significant for: Has urinary urgency and has to wear a pad.   Urinary Symptoms: {urine leakage?:24754} Leaks *** time(s) per {days/wks/mos/yrs:310907}.  Pad use: {NUMBERS 1-10:18281} {pad option:24752} per day.   She {ACTION; IS/IS TIW:58099833} bothered by her UI symptoms.  Day time voids ***.  Nocturia: *** times per night to void. Voiding dysfunction: she {empties:24755} her bladder well.  {DOES NOT does:27190::"does not"} use a catheter to empty bladder.  When urinating, she feels {urine symptoms:24756} Drinks: *** per day  UTIs: {NUMBERS 1-10:18281} UTI's in the last year.   {ACTIONS;DENIES/REPORTS:21021675::"Denies"} history of {urologic concerns:24757}  Pelvic Organ Prolapse Symptoms:                  She {denies/ admits to:24761} a feeling of a bulge the vaginal area. It has been present for {NUMBER 1-10:22536} {days/wks/mos/yrs:310907}.  She {denies/ admits to:24761} seeing a bulge.  This bulge {ACTION; IS/IS ASN:05397673} bothersome.  Bowel Symptom: Bowel movements: *** time(s) per {Time; day/week/month:13537} Stool consistency: {stool consistency:24758} Straining: {yes/no:19897}.  Splinting: {yes/no:19897}.  Incomplete evacuation: {yes/no:19897}.  She {denies/ admits to:24761} accidental bowel leakage / fecal incontinence  Occurs: *** time(s) per {Time; day/week/month:13537}  Consistency with leakage: {stool consistency:24758} Bowel regimen: {bowel regimen:24759} Last colonoscopy: Date ***, Results ***  Sexual  Function Sexually active: {yes/no:19897}.  Sexual orientation: {Sexual Orientation:[phone]} Pain with sex: {pain with sex:24762}  Pelvic Pain {denies/ admits to:24761} pelvic pain Location: *** Pain occurs: *** Prior pain treatment: *** Improved by: *** Worsened by: ***   Past Medical History:  Past Medical History:  Diagnosis Date   Allergy    Anxiety    Asthma    Bilateral carpal tunnel syndrome 12/31/2018   Bipolar affective, manic ([place]) 1996   COPD (chronic obstructive pulmonary disease) (HCC)    Depression    HLD (hyperlipidemia) 03/12/2018   Lung nodule, multiple 09/26/2016   Manic depressive illness ([place]) 1996   Seizures ([place])    Substance abuse ([place])    Tobacco abuse 10/04/2016     Past Surgical History:   Past Surgical History:  Procedure Laterality Date   ABDOMINAL HYSTERECTOMY     CARPAL TUNNEL RELEASE Right 01/15/2019   Procedure: RIGHTCARPAL TUNNEL RELEASE, LEFT DEQUERVAINS INJECTION;  Surgeon: Leandrew Koyanagi, MD;  Location: [place];  Service: Orthopedics;  Laterality: Right;   CARPAL TUNNEL RELEASE Left 02/26/2019   Procedure: LEFT CARPAL TUNNEL RELEASE;  Surgeon: Leandrew Koyanagi, MD;  Location: [place];  Service: Orthopedics;  Laterality: Left;  Bier block   COLONOSCOPY WITH PROPOFOL N/A 11/28/2018   Procedure: COLONOSCOPY WITH PROPOFOL;  Surgeon: [name], MD;  Location: [facility]COPY;  Service: Gastroenterology;  Laterality: N/A;   TUBAL LIGATION       Past OB/GYN History: G{NUMBERS 1-10:18281} P{NUMBERS 1-10:18281} Vaginal deliveries: ***,  Forceps/ Vacuum deliveries: ***, Cesarean section: *** Menopausal: {menopausal:24763} Contraception: ***. Last pap smear was ***.  Any history of abnormal pap smears: {yes/no:19897}.   Medications: She has a current medication list which includes the following prescription(s): albuterol, aripiprazole, aspirin ec, atomoxetine, rightest gm550 blood glucose, citalopram,  citalopram, fluticasone-salmeterol, glucose blood, ipratropium-albuterol, lamotrigine, lamotrigine, levetiracetam, metformin, rightest gl300 lancets, risperidone, rosuvastatin, spiriva handihaler, trazodone, UNABLE TO FIND, and [DISCONTINUED] loratadine.   Allergies: Patient is allergic to flax seed oil [bio-flax].   Social History:  Social History   Tobacco Use   Smoking status: Every Day    Packs/day: 0.50    Years: 36.00    Pack years: 18.00    Types: Cigarettes   Smokeless tobacco: Former    Types: Chew, Snuff    Quit date: 2018   Tobacco comments:    Using nicotine lozenges to try and help her quit smoking  Vaping Use   Vaping Use: Former   Quit date: 08/11/2016  Substance Use Topics   Alcohol use: No   Drug use: No    Types: "Crack" cocaine    Comment: history 2013    Relationship status: {relationship status:24764} She lives with ***.   She {ACTION; IS/IS JQB:34193790} employed ***. Regular exercise: {Yes/No:304960894} History of abuse: {Yes/No:304960894}  Family History:   Family History  Problem Relation Age of Onset   Stroke Mother    Other Mother        "blood clot"   Heart attack Father    CAD Father    Bipolar disorder Brother    Schizophrenia Brother    Schizophrenia Brother    Bipolar disorder Brother      Review of Systems: ROS   OBJECTIVE Physical Exam: There were no vitals filed for this visit.  Physical Exam   GU / Detailed Urogynecologic Evaluation:  Pelvic Exam: Normal external female genitalia; Bartholin's and Skene's glands normal in appearance; urethral meatus normal in appearance, no urethral masses or discharge.   CST: {gen negative/positive:315881}  Reflexes: bulbocavernosis {DESC; PRESENT/NOT PRESENT:21021351}, anocutaneous {DESC; PRESENT/NOT PRESENT:21021351} ***bilaterally.  Speculum exam reveals normal vaginal mucosa {With/Without:20273} atrophy. Cervix {exam; gyn cervix:30847}. Uterus {exam; pelvic uterus:30849}. Adnexa  {exam; adnexa:12223}.    s/p hysterectomy: Speculum exam reveals normal vaginal mucosa {With/Without:20273}  atrophy and normal vaginal cuff.  Adnexa {exam; adnexa:12223}.    With apex supported, anterior compartment defect was {reduced:24765}  Pelvic floor strength {Roman # I-V:19040}/V, puborectalis {Roman # I-V:19040}/V external anal sphincter {Roman # I-V:19040}/V  Pelvic floor musculature: Right levator {Tender/Non-tender:20250}, Right obturator {Tender/Non-tender:20250}, Left levator {Tender/Non-tender:20250}, Left obturator {Tender/Non-tender:20250}  POP-Q:   POP-Q                                               Aa                                               Ba                                                 C                                                Gh  Pb                                               tvl                                                Ap                                               Bp                                                 D     Rectal Exam:  Normal sphincter tone, {rectocele:24766} distal rectocele, enterocoele {DESC; PRESENT/NOT PRESENT:21021351}, no rectal masses, {sign of:24767} dyssynergia when asking the patient to bear down.  Post-Void Residual (PVR) by Bladder Scan: In order to evaluate bladder emptying, we discussed obtaining a postvoid residual and she agreed to this procedure.  Procedure: The ultrasound unit was placed on the patient's abdomen in the suprapubic region after the patient had voided. A PVR of *** ml was obtained by bladder scan.  Laboratory Results: @ENCLABS @   ***I visualized the urine specimen, noting the specimen to be {urine color:24768}  ASSESSMENT AND PLAN Ms. Ritthaler is a 55 y.o. with: No diagnosis found.    Jaquita Folds, MD   Medical Decision Making:  - Reviewed/ ordered a clinical laboratory test - Reviewed/ ordered a radiologic  study - Reviewed/ ordered medicine test - Decision to obtain old records - Discussion of management of or test interpretation with an external physician / other healthcare professional  - Assessment requiring independent historian - Review and summation of prior records - Independent review of image, tracing or specimen

## 2020-12-21 ENCOUNTER — Other Ambulatory Visit: Payer: Self-pay

## 2020-12-22 ENCOUNTER — Other Ambulatory Visit: Payer: Self-pay

## 2020-12-22 ENCOUNTER — Ambulatory Visit: Payer: Medicaid Other | Admitting: Obstetrics and Gynecology

## 2020-12-23 ENCOUNTER — Other Ambulatory Visit: Payer: Self-pay

## 2020-12-23 ENCOUNTER — Encounter: Payer: Self-pay | Admitting: Gerontology

## 2020-12-23 ENCOUNTER — Ambulatory Visit: Payer: Medicaid Other | Admitting: Gerontology

## 2020-12-23 VITALS — BP 107/72 | HR 97 | Temp 97.5°F | Resp 18 | Ht 67.0 in | Wt 181.1 lb

## 2020-12-23 DIAGNOSIS — Z720 Tobacco use: Secondary | ICD-10-CM

## 2020-12-23 DIAGNOSIS — E119 Type 2 diabetes mellitus without complications: Secondary | ICD-10-CM

## 2020-12-23 DIAGNOSIS — R569 Unspecified convulsions: Secondary | ICD-10-CM

## 2020-12-23 DIAGNOSIS — N393 Stress incontinence (female) (male): Secondary | ICD-10-CM

## 2020-12-23 DIAGNOSIS — J439 Emphysema, unspecified: Secondary | ICD-10-CM

## 2020-12-23 LAB — GLUCOSE, POCT (MANUAL RESULT ENTRY): POC Glucose: 130 mg/dl — AB (ref 70–99)

## 2020-12-23 LAB — POCT GLYCOSYLATED HEMOGLOBIN (HGB A1C): Hemoglobin A1C: 6.3 % — AB (ref 4.0–5.6)

## 2020-12-23 NOTE — Progress Notes (Signed)
Established Patient Office Visit  Subjective:  Patient ID: Erica Gomez, female    DOB: Feb 25, 1965  Age: 55 y.o. MRN: [id number]  CC:  Chief Complaint  Patient presents with   Follow-up   Diabetes    HPI Erica Gomez is a 55 year old female with history of seizure, Allergy, Asthma, Bilateral Carpal Tunnel Syndrome, COPD, Hyperlipidemia, Tobacco use, T2DM who presents for follow up of her diabetes today. She is not checking her blood sugars at home, stating she "cannot find her meter right now". Blood glucose is 130 mg/dL and her hemoglobin A1C is 6.3% at today's visit. She denies  hypo/hyperglycemic symptoms, peripheral neuropathy and performs daily foot check. She reports missing a few days of Keppra due to not having the copay for her refill. She has since switched to [facility] and has not missed a dose. She has an occasional productive cough with thick, white sputum. She denies fevers, hemoptysis. She just restarted Advair last week and has noticed improvement in her breathing, especially in the morning. She reports only using her rescue inhaler 2-3 times/week. She is using nicotine lozenges to reduce her smoking and is down to 5 cigarettes per day. She takes her crestor daily and is tolerating it well. She denies myalgias or dark urine. She reports still having stress incontinence, although the urgency is reduced since completing antibiotics. S She missed an appointment with [facility], needs to schedule a follow up mammogram, and has an upcoming appointment with Neurology in December. Otherwise, she states that she's doing well and offers no further complaint.   Past Medical History:  Diagnosis Date   Allergy    Anxiety    Asthma    Bilateral carpal tunnel syndrome 12/31/2018   Bipolar affective, manic ([place]) 1996   COPD (chronic obstructive pulmonary disease) (HCC)    Depression    HLD (hyperlipidemia) 03/12/2018   Lung nodule, multiple 09/26/2016    Manic depressive illness ([place]) 1996   Seizures ([place])    Substance abuse ([place])    Tobacco abuse 10/04/2016    Past Surgical History:  Procedure Laterality Date   ABDOMINAL HYSTERECTOMY     CARPAL TUNNEL RELEASE Right 01/15/2019   Procedure: RIGHTCARPAL TUNNEL RELEASE, LEFT DEQUERVAINS INJECTION;  Surgeon: Leandrew Koyanagi, MD;  Location: [place];  Service: Orthopedics;  Laterality: Right;   CARPAL TUNNEL RELEASE Left 02/26/2019   Procedure: LEFT CARPAL TUNNEL RELEASE;  Surgeon: Leandrew Koyanagi, MD;  Location: [place];  Service: Orthopedics;  Laterality: Left;  Bier block   COLONOSCOPY WITH PROPOFOL N/A 11/28/2018   Procedure: COLONOSCOPY WITH PROPOFOL;  Surgeon: [name], MD;  Location: [facility] ENDOSCOPY;  Service: Gastroenterology;  Laterality: N/A;   TUBAL LIGATION      Family History  Problem Relation Age of Onset   Stroke Mother    Other Mother        "blood clot"   Heart attack Father    CAD Father    Bipolar disorder Brother    Schizophrenia Brother    Schizophrenia Brother    Bipolar disorder Brother     Social History   Socioeconomic History   Marital status: Married    Spouse name: Not on file   Number of children: 2   Years of education: taking some medical billing classes now   Highest education level: Not on file  Occupational History   Occupation: Caretaker  Tobacco Use   Smoking status: Every Day  Packs/day: 0.50    Years: 36.00    Pack years: 18.00    Types: Cigarettes   Smokeless tobacco: Former    Types: Chew, Snuff    Quit date: 2018   Tobacco comments:    Using nicotine lozenges to try and help her quit smoking  Vaping Use   Vaping Use: Former   Quit date: 08/11/2016  Substance and Sexual Activity   Alcohol use: Yes    Comment: rare   Drug use: No    Types: "Crack" cocaine    Comment: history 2013   Sexual activity: Yes    Birth control/protection: Condom, None  Other Topics Concern   Not on file  Social  History Narrative   Lives with her husband.   Right-handed.   Caffeine use: 2 cups daily.   Social Determinants of Health   Financial Resource Strain: Not on file  Food Insecurity: No Food Insecurity   Worried About Charity fundraiser in the Last Year: Never true   Ran Out of Food in the Last Year: Never true  Transportation Needs: No Transportation Needs   Lack of Transportation (Medical): No   Lack of Transportation (Non-Medical): No  Physical Activity: Not on file  Stress: Not on file  Social Connections: Not on file  Intimate Partner Violence: Not on file    Outpatient Medications Prior to Visit  Medication Sig Dispense Refill   albuterol (VENTOLIN HFA) 108 (90 Base) MCG/ACT inhaler INHALE 2 PUFFS EVERY 4 TO 6 HOURS AS NEEDED 25.5 g 1   ARIPiprazole (ABILIFY) 5 MG tablet Take (1/2) tablet (2.$RemoveBeforeD'5mg'eZQDscgPhjNqjE$ ) by mouth once daily for 7 days. Then may increase to take 1 tablet ($RemoveB'5mg'vDVQxvQv$ ) once daily if well tolerated. 30 tablet 1   aspirin EC 81 MG tablet Take 1 tablet (81 mg total) by mouth daily. No ibuprofen for at least one hour     citalopram (CELEXA) 40 MG tablet Take 1 tablet (40 mg total) by mouth once daily. 30 tablet 1   fluticasone-salmeterol (ADVAIR) 500-50 MCG/ACT AEPB Inhale 1 puff into the lungs in the morning and at bedtime. 60 each 5   ipratropium-albuterol (DUONEB) 0.5-2.5 (3) MG/3ML SOLN INHALE CONTENTS OF ONE VIAL (3ML TOTAL) USING NEBULIZER  ONCE EVERY 6 HOURS. 360 mL 2   lamoTRIgine (LAMICTAL) 100 MG tablet Take 1 tablet (100 mg total) by mouth 2 (two) times daily. 180 tablet 4   levETIRAcetam (KEPPRA) 500 MG tablet Take 1 tablet (500 mg total) by mouth 2 (two) times daily. 180 tablet 3   metFORMIN (GLUCOPHAGE) 500 MG tablet TAKE ONE TABLET BY MOUTH ($RemoveB'500MG'MUMqajtD$ ) TWICE DAILY WITH MEALS. 120 tablet 1   risperiDONE (RISPERDAL) 1 MG tablet TAKE TWO TABLETS BY MOUTH ($RemoveB'2MG'ggNMmSzk$ ) AT BEDTIME 60 tablet 2   rosuvastatin (CRESTOR) 10 MG tablet TAKE ONE TABLET BY MOUTH ONCE EVERY DAY. 90 tablet 1    tiotropium (SPIRIVA HANDIHALER) 18 MCG inhalation capsule Place 1 capsule (18 mcg total) into inhaler and inhale once daily. 30 capsule 11   traZODone (DESYREL) 100 MG tablet Take 100 mg by mouth at bedtime as needed for sleep.     atomoxetine (STRATTERA) 25 MG capsule Take 1 capsule (25 mg total) by mouth once every evening after dinner. (Patient not taking: Reported on 12/23/2020) 30 capsule 1   Blood Glucose Monitoring Suppl (RIGHTEST GM550 BLOOD GLUCOSE) w/Device KIT USE TO TEST BLOOD GLUCOSE UP TO 4 TIMES PER DAY, IN THE MORNING AND AFTER MEALS (Patient not taking: No sig reported)  1 kit 1   glucose blood test strip USE TO TEST BLOOD GLUCOSE UP TO 4 TIMES PER DAY, IN THE MORNING AND AFTER MEALS (Patient not taking: No sig reported) 100 strip 11   lamoTRIgine (LAMICTAL) 100 MG tablet Take 1 tablet (100 mg total) by mouth once daily. 30 tablet 1   Rightest GL300 Lancets MISC USE TO TEST BLOOD GLUCOSE UP TO 4 TIMES PER DAY, IN THE MORNING AND AFTER MEALS (Patient not taking: No sig reported) 100 each 11   UNABLE TO FIND Med Name: Zantrex OTC diet pill (Patient not taking: Reported on 12/23/2020)     citalopram (CELEXA) 20 MG tablet Take 40 mg by mouth daily.     No facility-administered medications prior to visit.    Allergies  Allergen Reactions   Flax Seed Oil [Bio-Flax] Hives    Pt. Self reported    ROS Review of Systems  Constitutional:  Negative for activity change, chills, fatigue and fever.  HENT:  Negative for congestion, postnasal drip, rhinorrhea and sore throat.   Eyes:  Negative for discharge and itching.  Respiratory:  Positive for cough (occasionally productive with thick white sputum) and wheezing ("especially when I go out in the cold air"). Negative for chest tightness and shortness of breath.   Cardiovascular:  Negative for chest pain, palpitations and leg swelling.  Gastrointestinal:  Negative for diarrhea, nausea and vomiting.  Endocrine: Negative for polydipsia,  polyphagia and polyuria.  Genitourinary:  Positive for frequency and urgency. Negative for decreased urine volume.  Musculoskeletal: Negative.   Skin: Negative.   Neurological:  Negative for dizziness, seizures, weakness, numbness and headaches.  Psychiatric/Behavioral:  Negative for dysphoric mood.      Objective:    Physical Exam Constitutional:      General: She is not in acute distress.    Appearance: Normal appearance. She is normal weight.  HENT:     Head: Normocephalic.  Cardiovascular:     Rate and Rhythm: Normal rate and regular rhythm.     Pulses: Normal pulses.     Heart sounds: Normal heart sounds.  Pulmonary:     Effort: Pulmonary effort is normal. No tachypnea or accessory muscle usage.     Breath sounds: Examination of the right-upper field reveals wheezing. Examination of the left-upper field reveals wheezing. Wheezing (expiratory) present. No rhonchi or rales.  Abdominal:     General: Abdomen is flat.     Palpations: Abdomen is soft.  Musculoskeletal:        General: Normal range of motion.     Right lower leg: No edema.     Left lower leg: No edema.  Skin:    General: Skin is warm and dry.     Capillary Refill: Capillary refill takes less than 2 seconds.  Neurological:     General: No focal deficit present.     Mental Status: She is alert and oriented to person, place, and time.     Sensory: No sensory deficit.     Motor: No weakness.  Psychiatric:        Mood and Affect: Mood normal.        Behavior: Behavior normal.    BP 107/72 (BP Location: Right Arm, Patient Position: Sitting, Cuff Size: Large)   Pulse 97   Temp (!) 97.5 F (36.4 C) (Oral)   Resp 18   Ht $R'5\' 7"'XE$  (1.702 m)   Wt 181 lb 1.6 oz (82.1 kg)   SpO2 94%   BMI 28.36  kg/m  Wt Readings from Last 3 Encounters:  12/23/20 181 lb 1.6 oz (82.1 kg)  09/23/20 176 lb (79.8 kg)  08/11/20 176 lb 12.8 oz (80.2 kg)     Health Maintenance Due  Topic Date Due   COVID-19 Vaccine (1) Never done    OPHTHALMOLOGY EXAM  Never done   Hepatitis C Screening  Never done   TETANUS/TDAP  Never done   Zoster Vaccines- Shingrix (1 of 2) Never done   PAP SMEAR-Modifier  Never done   Pneumococcal Vaccine 76-72 Years old (2 - PCV) 01/01/2020   INFLUENZA VACCINE  09/13/2020   URINE MICROALBUMIN  12/23/2020    There are no preventive care reminders to display for this patient.  Lab Results  Component Value Date   TSH 1.030 05/11/2016   Lab Results  Component Value Date   WBC 9.8 12/07/2019   HGB 12.8 12/07/2019   HCT 40.4 12/07/2019   MCV 93.3 12/07/2019   PLT 279 12/07/2019   Lab Results  Component Value Date   NA 142 05/12/2020   K 4.6 05/12/2020   CO2 22 05/12/2020   GLUCOSE 114 (H) 05/12/2020   BUN 9 05/12/2020   CREATININE 1.03 (H) 05/12/2020   BILITOT <0.2 05/12/2020   ALKPHOS 77 05/12/2020   AST 14 05/12/2020   ALT 17 05/12/2020   PROT 6.6 05/12/2020   ALBUMIN 4.5 05/12/2020   CALCIUM 9.6 05/12/2020   ANIONGAP 11 12/07/2019   EGFR 65 05/12/2020   Lab Results  Component Value Date   CHOL 139 12/24/2019   Lab Results  Component Value Date   HDL 48 12/24/2019   Lab Results  Component Value Date   LDLCALC 70 12/24/2019   Lab Results  Component Value Date   TRIG 114 12/24/2019   Lab Results  Component Value Date   CHOLHDL 2.9 12/24/2019   Lab Results  Component Value Date   HGBA1C 6.3 (A) 12/23/2020      Assessment & Plan:   1. Type 2 diabetes mellitus without complication, without long-term current use of insulin ([place]) - Encouraged her to continue on metformin and with carbohydrate counting diet. Will collect routine labs at next visit. - POCT HgB A1C; Future - POCT Glucose (CBG); Future - Urine Microalbumin w/creat. ratio; Future - POCT Glucose (CBG) - POCT HgB A1C - Urine Microalbumin w/creat. ratio  2. Tobacco abuse - Encouraged her to continue trying to quit smoking cigarettes. She declined nicotine patches, preferring to use the  lozenges. No quit date set yet.  3. Pulmonary emphysema, unspecified emphysema type ([place]) - Instructed her to use both her Dulera and Spiriva inhalers daily as prescribed.  She was encouraged on smoking cessation. She was advised to notify clinic or go to the ED with any worsening symptoms.  4. Urinary, incontinence, stress female - She was encouraged to call [facility] phone number to check on the status of her application. She will then call [facility] to reschedule her missed visit.  5. Seizure ([place]) - She was encouraged to adhere to [place], to keep upcoming appointment with neurology, and to go to the ED with any seizure.    Follow-up: Return in about 3 months (around 03/22/2021), or if symptoms worsen or fail to improve.    Harvin Hazel, RN

## 2020-12-23 NOTE — Patient Instructions (Addendum)
Please call to check on the status of your [facility] application. [phone]. You will have to go through all of the options and speak with a customer service representative.   The phone number to [facility] is [phone]. Please call to reschedule your appointment.    Carbohydrate Counting for Diabetes Mellitus, Adult Carbohydrate counting is a method of keeping track of how many carbohydrates you eat. Eating carbohydrates increases the amount of sugar (glucose) in the blood. Counting how many carbohydrates you eat improves how well you manage your blood glucose. This, in turn, helps you manage your diabetes. Carbohydrates are measured in grams (g) per serving. It is important to know how many carbohydrates (in grams or by serving size) you can have in each meal. This is different for every person. A dietitian can help you make a meal plan and calculate how many carbohydrates you should have at each meal and snack. What foods contain carbohydrates? Carbohydrates are found in the following foods: Grains, such as breads and cereals. Dried beans and soy products. Starchy vegetables, such as potatoes, peas, and corn. Fruit and fruit juices. Milk and yogurt. Sweets and snack foods, such as cake, cookies, candy, chips, and soft drinks. How do I count carbohydrates in foods? There are two ways to count carbohydrates in food. You can read food labels or learn standard serving sizes of foods. You can use either of these methods or a combination of both. Using the Nutrition Facts label The Nutrition Facts list is included on the labels of almost all packaged foods and beverages in the [place]. It includes: The serving size. Information about nutrients in each serving, including the grams of carbohydrate per serving. To use the Nutrition Facts, decide how many servings you will have. Then, multiply the number of servings by the number of carbohydrates per serving. The  resulting number is the total grams of carbohydrates that you will be having. Learning the standard serving sizes of foods When you eat carbohydrate foods that are not packaged or do not include Nutrition Facts on the label, you need to measure the servings in order to count the grams of carbohydrates. Measure the foods that you will eat with a food scale or measuring cup, if needed. Decide how many standard-size servings you will eat. Multiply the number of servings by 15. For foods that contain carbohydrates, one serving equals 15 g of carbohydrates. For example, if you eat 2 cups or 10 oz (300 g) of strawberries, you will have eaten 2 servings and 30 g of carbohydrates (2 servings x 15 g = 30 g). For foods that have more than one food mixed, such as soups and casseroles, you must count the carbohydrates in each food that is included. The following list contains standard serving sizes of common carbohydrate-rich foods. Each of these servings has about 15 g of carbohydrates: 1 slice of bread. 1 six-inch (15 cm) tortilla. ? cup or 2 oz (53 g) cooked rice or pasta.  cup or 3 oz (85 g) cooked or canned, drained and rinsed beans or lentils.  cup or 3 oz (85 g) starchy vegetable, such as peas, corn, or squash.  cup or 4 oz (120 g) hot cereal.  cup or 3 oz (85 g) boiled or mashed potatoes, or  or 3 oz (85 g) of a large baked potato.  cup or 4 fl oz (118 mL) fruit juice. 1 cup or 8 fl oz (237 mL) milk. 1 small or 4 oz (  106 g) apple.  or 2 oz (63 g) of a medium banana. 1 cup or 5 oz (150 g) strawberries. 3 cups or 1 oz (28.3 g) popped popcorn. What is an example of carbohydrate counting? To calculate the grams of carbohydrates in this sample meal, follow the steps shown below. Sample meal 3 oz (85 g) chicken breast. ? cup or 4 oz (106 g) brown rice.  cup or 3 oz (85 g) corn. 1 cup or 8 fl oz (237 mL) milk. 1 cup or 5 oz (150 g) strawberries with sugar-free whipped topping. Carbohydrate  calculation Identify the foods that contain carbohydrates: Rice. Corn. Milk. Strawberries. Calculate how many servings you have of each food: 2 servings rice. 1 serving corn. 1 serving milk. 1 serving strawberries. Multiply each number of servings by 15 g: 2 servings rice x 15 g = 30 g. 1 serving corn x 15 g = 15 g. 1 serving milk x 15 g = 15 g. 1 serving strawberries x 15 g = 15 g. Add together all of the amounts to find the total grams of carbohydrates eaten: 30 g + 15 g + 15 g + 15 g = 75 g of carbohydrates total. What are tips for following this plan? Shopping Develop a meal plan and then make a shopping list. Buy fresh and frozen vegetables, fresh and frozen fruit, dairy, eggs, beans, lentils, and whole grains. Look at food labels. Choose foods that have more fiber and less sugar. Avoid processed foods and foods with added sugars. Meal planning Aim to have the same number of grams of carbohydrates at each meal and for each snack time. Plan to have regular, balanced meals and snacks. Where to find more information American Diabetes Association: diabetes.org Centers for Disease Control and Prevention: StoreMirror.com.[place] Academy of Nutrition and Dietetics: eatright.org Association of Diabetes Care & Education Specialists: diabeteseducator.org Summary Carbohydrate counting is a method of keeping track of how many carbohydrates you eat. Eating carbohydrates increases the amount of sugar (glucose) in your blood. Counting how many carbohydrates you eat improves how well you manage your blood glucose. This helps you manage your diabetes. A dietitian can help you make a meal plan and calculate how many carbohydrates you should have at each meal and snack. This information is not intended to replace advice given to you by your health care provider. Make sure you discuss any questions you have with your health care provider. Document Revised: 09/03/2019 Document Reviewed: 09/03/2019 Elsevier  Patient Education  [place].

## 2020-12-24 LAB — MICROALBUMIN / CREATININE URINE RATIO
Creatinine, Urine: 66.5 mg/dL
Microalb/Creat Ratio: <5 mg/g creat (ref 0–29)
Microalbumin, Urine: <3 ug/mL

## 2020-12-30 ENCOUNTER — Telehealth: Payer: Self-pay | Admitting: Pharmacist

## 2020-12-30 NOTE — Telephone Encounter (Signed)
12/30/2020 11:06:45 AM - Advair refill online with [facility]  -- Erica Gomez - Thursday, December 30, 2020 11:05 AM --Refilled Advair 500/50 online with [facility], order# [id number]

## 2021-01-05 ENCOUNTER — Ambulatory Visit: Payer: Self-pay | Admitting: Neurology

## 2021-01-18 ENCOUNTER — Other Ambulatory Visit: Payer: Self-pay

## 2021-01-25 ENCOUNTER — Ambulatory Visit: Payer: Self-pay | Admitting: Neurology

## 2021-01-26 ENCOUNTER — Other Ambulatory Visit: Payer: Self-pay

## 2021-01-27 ENCOUNTER — Other Ambulatory Visit: Payer: Self-pay

## 2021-02-01 ENCOUNTER — Other Ambulatory Visit: Payer: Self-pay

## 2021-02-01 MED ORDER — ARIPIPRAZOLE 5 MG PO TABS
5.0000 mg | ORAL_TABLET | Freq: Every day | ORAL | 1 refills | Status: DC
  Filled 2021-02-01: qty 30, 30d supply, fill #0

## 2021-02-01 MED ORDER — CITALOPRAM HYDROBROMIDE 40 MG PO TABS
40.0000 mg | ORAL_TABLET | Freq: Every day | ORAL | 1 refills | Status: DC
  Filled 2021-02-01: qty 30, 30d supply, fill #0

## 2021-02-01 MED ORDER — LAMOTRIGINE 100 MG PO TABS
100.0000 mg | ORAL_TABLET | Freq: Every day | ORAL | 1 refills | Status: DC
  Filled 2021-02-01 (×2): qty 30, 30d supply, fill #0
  Filled 2021-02-26: qty 30, 30d supply, fill #1

## 2021-02-02 ENCOUNTER — Other Ambulatory Visit: Payer: Self-pay

## 2021-02-15 ENCOUNTER — Other Ambulatory Visit: Payer: Self-pay

## 2021-02-28 ENCOUNTER — Other Ambulatory Visit: Payer: Self-pay

## 2021-03-07 ENCOUNTER — Other Ambulatory Visit: Payer: Self-pay

## 2021-03-08 ENCOUNTER — Other Ambulatory Visit: Payer: Self-pay

## 2021-03-08 MED ORDER — ARIPIPRAZOLE 5 MG PO TABS
5.0000 mg | ORAL_TABLET | Freq: Every day | ORAL | 1 refills | Status: DC
  Filled 2021-03-08: qty 30, 30d supply, fill #0

## 2021-03-08 MED ORDER — LAMOTRIGINE 100 MG PO TABS: 100.0000 mg | ORAL_TABLET | Freq: Every day | ORAL | 1 refills | Status: DC

## 2021-03-08 MED ORDER — CITALOPRAM HYDROBROMIDE 40 MG PO TABS
40.0000 mg | ORAL_TABLET | Freq: Every day | ORAL | 0 refills | Status: DC
Start: 2021-03-08 — End: 2021-04-19
  Filled 2021-03-08: qty 30, 30d supply, fill #0

## 2021-03-09 ENCOUNTER — Other Ambulatory Visit: Payer: Self-pay

## 2021-03-10 ENCOUNTER — Other Ambulatory Visit: Payer: Self-pay

## 2021-03-22 ENCOUNTER — Ambulatory Visit: Payer: Medicaid Other | Admitting: Gerontology

## 2021-03-22 ENCOUNTER — Other Ambulatory Visit: Payer: Self-pay

## 2021-03-22 ENCOUNTER — Encounter: Payer: Self-pay | Admitting: Gerontology

## 2021-03-22 VITALS — BP 120/77 | HR 93 | Temp 97.8°F | Resp 18 | Ht 66.0 in | Wt 183.7 lb

## 2021-03-22 DIAGNOSIS — Z0000 Encounter for general adult medical examination without abnormal findings: Secondary | ICD-10-CM

## 2021-03-22 DIAGNOSIS — J439 Emphysema, unspecified: Secondary | ICD-10-CM

## 2021-03-22 DIAGNOSIS — E119 Type 2 diabetes mellitus without complications: Secondary | ICD-10-CM

## 2021-03-22 LAB — POCT GLYCOSYLATED HEMOGLOBIN (HGB A1C): Hemoglobin A1C: 6.2 % — AB (ref 4.0–5.6)

## 2021-03-22 LAB — GLUCOSE, POCT (MANUAL RESULT ENTRY): POC Glucose: 87 mg/dl (ref 70–99)

## 2021-03-22 MED ORDER — FLUTICASONE-SALMETEROL 500-50 MCG/ACT IN AEPB
1.0000 | INHALATION_SPRAY | Freq: Two times a day (BID) | RESPIRATORY_TRACT | 5 refills | Status: DC
Start: 2021-03-22 — End: 2021-09-21
  Filled 2021-03-22: qty 180, 90d supply, fill #0
  Filled 2021-06-13: qty 180, 90d supply, fill #1

## 2021-03-22 MED ORDER — METFORMIN HCL 500 MG PO TABS
ORAL_TABLET | Freq: Two times a day (BID) | ORAL | 1 refills | Status: DC
  Filled 2021-03-22 – 2021-08-11 (×4): qty 60, 30d supply, fill #0

## 2021-03-22 MED ORDER — CITALOPRAM HYDROBROMIDE 40 MG PO TABS: ORAL_TABLET | ORAL | 1 refills | Status: DC

## 2021-03-22 MED ORDER — ROSUVASTATIN CALCIUM 10 MG PO TABS
ORAL_TABLET | Freq: Every day | ORAL | 1 refills | Status: DC
  Filled 2021-03-22: qty 90, 90d supply, fill #0
  Filled 2021-05-30: qty 30, 30d supply, fill #0
  Filled 2021-05-30: qty 90, 90d supply, fill #0

## 2021-03-22 MED ORDER — ALBUTEROL SULFATE HFA 108 (90 BASE) MCG/ACT IN AERS
2.0000 | INHALATION_SPRAY | RESPIRATORY_TRACT | 1 refills | Status: DC
  Filled 2021-03-22: qty 8.5, 17d supply, fill #0

## 2021-03-22 NOTE — Patient Instructions (Signed)
Heart-Healthy Eating Plan Heart-healthy meal planning includes: Eating less unhealthy fats. Eating more healthy fats. Making other changes in your diet. Talk with your doctor or a diet specialist (dietitian) to create an eating plan that is right for you. What is my plan? Your doctor may recommend an eating plan that includes: Total fat: ______% or less of total calories a day. Saturated fat: ______% or less of total calories a day. Cholesterol: less than _________mg a day. What are tips for following this plan? Cooking Avoid frying your food. Try to bake, boil, grill, or broil it instead. You can also reduce fat by: Removing the skin from poultry. Removing all visible fats from meats. Steaming vegetables in water or broth. Meal planning  At meals, divide your plate into four equal parts: Fill one-half of your plate with vegetables and green salads. Fill one-fourth of your plate with whole grains. Fill one-fourth of your plate with lean protein foods. Eat 4-5 servings of vegetables per day. A serving of vegetables is: 1 cup of raw or cooked vegetables. 2 cups of raw leafy greens. Eat 4-5 servings of fruit per day. A serving of fruit is: 1 medium whole fruit.  cup of dried fruit.  cup of fresh, frozen, or canned fruit.  cup of 100% fruit juice. Eat more foods that have soluble fiber. These are apples, broccoli, carrots, beans, peas, and barley. Try to get 20-30 g of fiber per day. Eat 4-5 servings of nuts, legumes, and seeds per week: 1 serving of dried beans or legumes equals  cup after being cooked. 1 serving of nuts is  cup. 1 serving of seeds equals 1 tablespoon. General information Eat more home-cooked food. Eat less restaurant, buffet, and fast food. Limit or avoid alcohol. Limit foods that are high in starch and sugar. Avoid fried foods. Lose weight if you are overweight. Keep track of how much salt (sodium) you eat. This is important if you have high blood  pressure. Ask your doctor to tell you more about this. Try to add vegetarian meals each week. Fats Choose healthy fats. These include olive oil and canola oil, flaxseeds, walnuts, almonds, and seeds. Eat more omega-3 fats. These include salmon, mackerel, sardines, tuna, flaxseed oil, and ground flaxseeds. Try to eat fish at least 2 times each week. Check food labels. Avoid foods with trans fats or high amounts of saturated fat. Limit saturated fats. These are often found in animal products, such as meats, butter, and cream. These are also found in plant foods, such as palm oil, palm kernel oil, and coconut oil. Avoid foods with partially hydrogenated oils in them. These have trans fats. Examples are stick margarine, some tub margarines, cookies, crackers, and other baked goods. What foods can I eat? Fruits All fresh, canned (in natural juice), or frozen fruits. Vegetables Fresh or frozen vegetables (raw, steamed, roasted, or grilled). Green salads. Grains Most grains. Choose whole wheat and whole grains most of the time. Rice and pasta, including brown rice and pastas made with whole wheat. Meats and other proteins Lean, well-trimmed beef, veal, pork, and lamb. Chicken and [place] without skin. All fish and shellfish. Wild duck, rabbit, pheasant, and venison. Egg whites or low-cholesterol egg substitutes. Dried beans, peas, lentils, and tofu. Seeds and most nuts. Dairy Low-fat or nonfat cheeses, including ricotta and mozzarella. Skim or 1% milk that is liquid, powdered, or evaporated. Buttermilk that is made with low-fat milk. Nonfat or low-fat yogurt. Fats and oils Non-hydrogenated (trans-free) margarines. Vegetable oils, including  soybean, sesame, sunflower, olive, peanut, safflower, corn, canola, and cottonseed. Salad dressings or mayonnaise made with a vegetable oil. Beverages Mineral water. Coffee and tea. Diet carbonated beverages. Sweets and desserts Sherbet, gelatin, and fruit ice.  Small amounts of dark chocolate. Limit all sweets and desserts. Seasonings and condiments All seasonings and condiments. The items listed above may not be a complete list of foods and drinks you can eat. Contact a dietitian for more options. What foods should I avoid? Fruits Canned fruit in heavy syrup. Fruit in cream or butter sauce. Fried fruit. Limit coconut. Vegetables Vegetables cooked in cheese, cream, or butter sauce. Fried vegetables. Grains Breads that are made with saturated or trans fats, oils, or whole milk. Croissants. Sweet rolls. Donuts. High-fat crackers, such as cheese crackers. Meats and other proteins Fatty meats, such as hot dogs, ribs, sausage, bacon, rib-eye roast or steak. High-fat deli meats, such as salami and bologna. Caviar. Domestic duck and goose. Organ meats, such as liver. Dairy Cream, sour cream, cream cheese, and creamed cottage cheese. Whole-milk cheeses. Whole or 2% milk that is liquid, evaporated, or condensed. Whole buttermilk. Cream sauce or high-fat cheese sauce. Yogurt that is made from whole milk. Fats and oils Meat fat, or shortening. Cocoa butter, hydrogenated oils, palm oil, coconut oil, palm kernel oil. Solid fats and shortenings, including bacon fat, salt pork, lard, and butter. Nondairy cream substitutes. Salad dressings with cheese or sour cream. Beverages Regular sodas and juice drinks with added sugar. Sweets and desserts Frosting. Pudding. Cookies. Cakes. Pies. Milk chocolate or white chocolate. Buttered syrups. Full-fat ice cream or ice cream drinks. The items listed above may not be a complete list of foods and drinks to avoid. Contact a dietitian for more information. Summary Heart-healthy meal planning includes eating less unhealthy fats, eating more healthy fats, and making other changes in your diet. Eat a balanced diet. This includes fruits and vegetables, low-fat or nonfat dairy, lean protein, nuts and legumes, whole grains, and  heart-healthy oils and fats. This information is not intended to replace advice given to you by your health care provider. Make sure you discuss any questions you have with your health care provider. Document Revised: 06/10/2020 Document Reviewed: 06/10/2020 Elsevier Patient Education  2022 [place] for Diabetes Mellitus, Adult Carbohydrate counting is a method of keeping track of how many carbohydrates you eat. Eating carbohydrates increases the amount of sugar (glucose) in the blood. Counting how many carbohydrates you eat improves how well you manage your blood glucose. This, in turn, helps you manage your diabetes. Carbohydrates are measured in grams (g) per serving. It is important to know how many carbohydrates (in grams or by serving size) you can have in each meal. This is different for every person. A dietitian can help you make a meal plan and calculate how many carbohydrates you should have at each meal and snack. What foods contain carbohydrates? Carbohydrates are found in the following foods: Grains, such as breads and cereals. Dried beans and soy products. Starchy vegetables, such as potatoes, peas, and corn. Fruit and fruit juices. Milk and yogurt. Sweets and snack foods, such as cake, cookies, candy, chips, and soft drinks. How do I count carbohydrates in foods? There are two ways to count carbohydrates in food. You can read food labels or learn standard serving sizes of foods. You can use either of these methods or a combination of both. Using the Nutrition Facts label The Nutrition Facts list is included on the labels of  almost all packaged foods and beverages in the [place]. It includes: The serving size. Information about nutrients in each serving, including the grams of carbohydrate per serving. To use the Nutrition Facts, decide how many servings you will have. Then, multiply the number of servings by the number of carbohydrates per  serving. The resulting number is the total grams of carbohydrates that you will be having. Learning the standard serving sizes of foods When you eat carbohydrate foods that are not packaged or do not include Nutrition Facts on the label, you need to measure the servings in order to count the grams of carbohydrates. Measure the foods that you will eat with a food scale or measuring cup, if needed. Decide how many standard-size servings you will eat. Multiply the number of servings by 15. For foods that contain carbohydrates, one serving equals 15 g of carbohydrates. For example, if you eat 2 cups or 10 oz (300 g) of strawberries, you will have eaten 2 servings and 30 g of carbohydrates (2 servings x 15 g = 30 g). For foods that have more than one food mixed, such as soups and casseroles, you must count the carbohydrates in each food that is included. The following list contains standard serving sizes of common carbohydrate-rich foods. Each of these servings has about 15 g of carbohydrates: 1 slice of bread. 1 six-inch (15 cm) tortilla. ? cup or 2 oz (53 g) cooked rice or pasta.  cup or 3 oz (85 g) cooked or canned, drained and rinsed beans or lentils.  cup or 3 oz (85 g) starchy vegetable, such as peas, corn, or squash.  cup or 4 oz (120 g) hot cereal.  cup or 3 oz (85 g) boiled or mashed potatoes, or  or 3 oz (85 g) of a large baked potato.  cup or 4 fl oz (118 mL) fruit juice. 1 cup or 8 fl oz (237 mL) milk. 1 small or 4 oz (106 g) apple.  or 2 oz (63 g) of a medium banana. 1 cup or 5 oz (150 g) strawberries. 3 cups or 1 oz (28.3 g) popped popcorn. What is an example of carbohydrate counting? To calculate the grams of carbohydrates in this sample meal, follow the steps shown below. Sample meal 3 oz (85 g) chicken breast. ? cup or 4 oz (106 g) brown rice.  cup or 3 oz (85 g) corn. 1 cup or 8 fl oz (237 mL) milk. 1 cup or 5 oz (150 g) strawberries with sugar-free whipped  topping. Carbohydrate calculation Identify the foods that contain carbohydrates: Rice. Corn. Milk. Strawberries. Calculate how many servings you have of each food: 2 servings rice. 1 serving corn. 1 serving milk. 1 serving strawberries. Multiply each number of servings by 15 g: 2 servings rice x 15 g = 30 g. 1 serving corn x 15 g = 15 g. 1 serving milk x 15 g = 15 g. 1 serving strawberries x 15 g = 15 g. Add together all of the amounts to find the total grams of carbohydrates eaten: 30 g + 15 g + 15 g + 15 g = 75 g of carbohydrates total. What are tips for following this plan? Shopping Develop a meal plan and then make a shopping list. Buy fresh and frozen vegetables, fresh and frozen fruit, dairy, eggs, beans, lentils, and whole grains. Look at food labels. Choose foods that have more fiber and less sugar. Avoid processed foods and foods with added sugars. Meal planning  Aim to have the same number of grams of carbohydrates at each meal and for each snack time. Plan to have regular, balanced meals and snacks. Where to find more information American Diabetes Association: diabetes.org Centers for Disease Control and Prevention: StoreMirror.com.[place] Academy of Nutrition and Dietetics: eatright.org Association of Diabetes Care & Education Specialists: diabeteseducator.org Summary Carbohydrate counting is a method of keeping track of how many carbohydrates you eat. Eating carbohydrates increases the amount of sugar (glucose) in your blood. Counting how many carbohydrates you eat improves how well you manage your blood glucose. This helps you manage your diabetes. A dietitian can help you make a meal plan and calculate how many carbohydrates you should have at each meal and snack. This information is not intended to replace advice given to you by your health care provider. Make sure you discuss any questions you have with your health care provider. Document Revised: 09/03/2019 Document Reviewed:  09/03/2019 Elsevier Patient Education  [place].

## 2021-03-22 NOTE — Progress Notes (Signed)
Established Patient Office Visit  Subjective:  Patient ID: Erica Gomez, female    DOB: 08-26-1965  Age: 56 y.o. MRN: [id number]  CC:  Chief Complaint  Patient presents with   Follow-up   Diabetes    HPI AANIKA Gomez  is a 56 year old female with history of seizure, Allergy, Asthma, Bilateral Carpal Tunnel Syndrome, COPD, Hyperlipidemia, Tobacco use, T2DM presents for routine follow up visit and medication refill. She states that she's complaint Her HgbA1c was 6.2%, she does not check her blood glucose, denies hypo/hyperglycemic symptoms, peripheral neuropathy. She denies seizure activities. She reports that she had Covid 19 virus infection in December of 2022. Overall, she states that she's doing well and offers no further complaint.  Past Medical History:  Diagnosis Date   Allergy    Anxiety    Asthma    Bilateral carpal tunnel syndrome 12/31/2018   Bipolar affective, manic ([place]) 1996   COPD (chronic obstructive pulmonary disease) (HCC)    Depression    HLD (hyperlipidemia) 03/12/2018   Lung nodule, multiple 09/26/2016   Manic depressive illness ([place]) 1996   Seizures ([place])    Substance abuse ([place])    Tobacco abuse 10/04/2016    Past Surgical History:  Procedure Laterality Date   ABDOMINAL HYSTERECTOMY     CARPAL TUNNEL RELEASE Right 01/15/2019   Procedure: RIGHTCARPAL TUNNEL RELEASE, LEFT DEQUERVAINS INJECTION;  Surgeon: Leandrew Koyanagi, MD;  Location: [facility];  Service: Orthopedics;  Laterality: Right;   CARPAL TUNNEL RELEASE Left 02/26/2019   Procedure: LEFT CARPAL TUNNEL RELEASE;  Surgeon: Leandrew Koyanagi, MD;  Location: [place];  Service: Orthopedics;  Laterality: Left;  Bier block   COLONOSCOPY WITH PROPOFOL N/A 11/28/2018   Procedure: COLONOSCOPY WITH PROPOFOL;  Surgeon: [name], MD;  Location: [facility] ENDOSCOPY;  Service: Gastroenterology;  Laterality: N/A;   TUBAL LIGATION      Family History  Problem Relation Age of Onset    Stroke Mother    Other Mother        "blood clot"   Heart attack Father    CAD Father    Bipolar disorder Brother    Schizophrenia Brother    Schizophrenia Brother    Bipolar disorder Brother     Social History   Socioeconomic History   Marital status: Married    Spouse name: Not on file   Number of children: 2   Years of education: taking some medical billing classes now   Highest education level: Not on file  Occupational History   Occupation: Caretaker  Tobacco Use   Smoking status: Every Day    Packs/day: 0.50    Years: 36.00    Pack years: 18.00    Types: Cigarettes   Smokeless tobacco: Former    Types: Chew, Snuff    Quit date: 2018   Tobacco comments:    Using nicotine lozenges to try and help her quit smoking  Vaping Use   Vaping Use: Former   Quit date: 08/11/2016  Substance and Sexual Activity   Alcohol use: Yes    Comment: rare   Drug use: No    Types: "Crack" cocaine    Comment: history 2013   Sexual activity: Yes    Birth control/protection: Condom, None  Other Topics Concern   Not on file  Social History Narrative   Lives with her husband.   Right-handed.   Caffeine use: 2 cups daily.   Social Determinants of Health  Financial Resource Strain: Not on file  Food Insecurity: No Food Insecurity   Worried About Charity fundraiser in the Last Year: Never true   Arboriculturist in the Last Year: Never true  Transportation Needs: No Transportation Needs   Lack of Transportation (Medical): No   Lack of Transportation (Non-Medical): No  Physical Activity: Not on file  Stress: Not on file  Social Connections: Not on file  Intimate Partner Violence: Not on file    Outpatient Medications Prior to Visit  Medication Sig Dispense Refill   amphetamine-dextroamphetamine (ADDERALL XR) 10 MG 24 hr capsule Take 10 mg by mouth daily.     ARIPiprazole (ABILIFY) 5 MG tablet Take 1 tablet (5 mg total) by mouth once daily if well tolerated. 30 tablet 1    aspirin EC 81 MG tablet Take 1 tablet (81 mg total) by mouth daily. No ibuprofen for at least one hour     citalopram (CELEXA) 40 MG tablet Take 1 tablet (40 mg total) by mouth once daily. 30 tablet 0   ipratropium-albuterol (DUONEB) 0.5-2.5 (3) MG/3ML SOLN INHALE CONTENTS OF ONE VIAL (3ML TOTAL) USING NEBULIZER  ONCE EVERY 6 HOURS. 360 mL 2   lamoTRIgine (LAMICTAL) 100 MG tablet Take 1 tablet (100 mg total) by mouth 2 (two) times daily. 180 tablet 4   levETIRAcetam (KEPPRA) 500 MG tablet Take 1 tablet (500 mg total) by mouth 2 (two) times daily. 180 tablet 3   tiotropium (SPIRIVA HANDIHALER) 18 MCG inhalation capsule Place 1 capsule (18 mcg total) into inhaler and inhale once daily. 30 capsule 11   albuterol (VENTOLIN HFA) 108 (90 Base) MCG/ACT inhaler INHALE 2 PUFFS EVERY 4 TO 6 HOURS AS NEEDED 25.5 g 1   fluticasone-salmeterol (ADVAIR) 500-50 MCG/ACT AEPB Inhale 1 puff into the lungs in the morning and at bedtime. 60 each 5   metFORMIN (GLUCOPHAGE) 500 MG tablet TAKE ONE TABLET BY MOUTH TWICE DAILY WITH MEALS. 120 tablet 1   rosuvastatin (CRESTOR) 10 MG tablet TAKE ONE TABLET BY MOUTH ONCE EVERY DAY. 90 tablet 1   traZODone (DESYREL) 100 MG tablet Take 100 mg by mouth at bedtime as needed for sleep.     lamoTRIgine (LAMICTAL) 100 MG tablet Take 1 tablet (100 mg total) by mouth once daily. 30 tablet 1   ARIPiprazole (ABILIFY PO) Take 1 tablet by mouth daily.     risperiDONE (RISPERDAL) 1 MG tablet TAKE TWO TABLETS BY MOUTH (2MG) AT BEDTIME 60 tablet 2   UNABLE TO FIND Med Name: Zantrex OTC diet pill (Patient not taking: Reported on 12/23/2020)     No facility-administered medications prior to visit.    Allergies  Allergen Reactions   Flax Seed Oil [Bio-Flax] Hives    Pt. Self reported   Trazodone Other (See Comments)    Bed wetting    ROS Review of Systems  Constitutional: Negative.   Eyes: Negative.   Respiratory: Negative.    Cardiovascular: Negative.   Endocrine: Negative.    Skin: Negative.   Neurological: Negative.   Psychiatric/Behavioral: Negative.       Objective:    Physical Exam HENT:     Head: Normocephalic and atraumatic.     Mouth/Throat:     Mouth: Mucous membranes are moist.  Eyes:     Extraocular Movements: Extraocular movements intact.     Conjunctiva/sclera: Conjunctivae normal.     Pupils: Pupils are equal, round, and reactive to light.  Cardiovascular:     Rate and  Rhythm: Normal rate and regular rhythm.     Pulses: Normal pulses.     Heart sounds: Normal heart sounds.  Pulmonary:     Effort: Pulmonary effort is normal.     Breath sounds: Normal breath sounds.  Skin:    General: Skin is warm.  Neurological:     General: No focal deficit present.     Mental Status: She is alert and oriented to person, place, and time. Mental status is at baseline.  Psychiatric:        Mood and Affect: Mood normal.        Behavior: Behavior normal.        Thought Content: Thought content normal.        Judgment: Judgment normal.    BP 120/77 (BP Location: Right Arm, Patient Position: Sitting, Cuff Size: Large)    Pulse 93    Temp 97.8 F (36.6 C) (Oral)    Resp 18    Ht _0  (1.676 m)    Wt 183 lb 11.2 oz (83.3 kg)    SpO2 96%    BMI 29.65 kg/m  Wt Readings from Last 3 Encounters:  03/22/21 183 lb 11.2 oz (83.3 kg)  12/23/20 181 lb 1.6 oz (82.1 kg)  09/23/20 176 lb (79.8 kg)     Health Maintenance Due  Topic Date Due   COVID-19 Vaccine (1) Never done   OPHTHALMOLOGY EXAM  Never done   Hepatitis C Screening  Never done   TETANUS/TDAP  Never done   Zoster Vaccines- Shingrix (1 of 2) Never done   PAP SMEAR-Modifier  Never done    There are no preventive care reminders to display for this patient.  Lab Results  Component Value Date   TSH 1.030 05/11/2016   Lab Results  Component Value Date   WBC 9.8 12/07/2019   HGB 12.8 12/07/2019   HCT 40.4 12/07/2019   MCV 93.3 12/07/2019   PLT 279 12/07/2019   Lab Results  Component  Value Date   NA 142 05/12/2020   K 4.6 05/12/2020   CO2 22 05/12/2020   GLUCOSE 114 (H) 05/12/2020   BUN 9 05/12/2020   CREATININE 1.03 (H) 05/12/2020   BILITOT <0.2 05/12/2020   ALKPHOS 77 05/12/2020   AST 14 05/12/2020   ALT 17 05/12/2020   PROT 6.6 05/12/2020   ALBUMIN 4.5 05/12/2020   CALCIUM 9.6 05/12/2020   ANIONGAP 11 12/07/2019   EGFR 65 05/12/2020   Lab Results  Component Value Date   CHOL 139 12/24/2019   Lab Results  Component Value Date   HDL 48 12/24/2019   Lab Results  Component Value Date   LDLCALC 70 12/24/2019   Lab Results  Component Value Date   TRIG 114 12/24/2019   Lab Results  Component Value Date   CHOLHDL 2.9 12/24/2019   Lab Results  Component Value Date   HGBA1C 6.2 (A) 03/22/2021      Assessment & Plan:     1. Type 2 diabetes mellitus without complication, without long-term current use of insulin (HCC) - Her HgbA1c was 6.2%, her diabetes is under control, she will continue current medication, low carb/non concentrated sweet diet and exercise as tolerated. - POCT HgB A1C - POCT Glucose (CBG) - metFORMIN (GLUCOPHAGE) 500 MG tablet; TAKE ONE TABLET BY MOUTH TWICE DAILY WITH MEALS.  Dispense: 120 tablet; Refill: 1  2. Pulmonary emphysema, unspecified emphysema type ([place]) - Her breathing is stable, she will continue on current medication -  fluticasone-salmeterol (ADVAIR) 500-50 MCG/ACT AEPB; Inhale 1 puff into the lungs in the morning and at bedtime.  Dispense: 60 each; Refill: 5 - albuterol (PROAIR HFA) 108 (90 Base) MCG/ACT inhaler; Inhale 2 puffs into the lungs every 4-6 hours as needed.  Dispense: 25.5 g; Refill: 1  3. Health care maintenance -She will continue Crestor, low fat/cholesterol diet and will check routine lab prior to follow up appointment. - rosuvastatin (CRESTOR) 10 MG tablet; TAKE ONE TABLET BY MOUTH ONCE EVERY DAY.  Dispense: 90 tablet; Refill: 1 - CBC w/Diff; Future - Comp Met (CMET); Future - Lipid panel;  Future - HgB A1c; Future    Follow-up: Return in about 26 weeks (around 09/20/2021), or if symptoms worsen or fail to improve.    Julianne Chamberlin Jerold Coombe, NP

## 2021-04-19 ENCOUNTER — Other Ambulatory Visit: Payer: Self-pay

## 2021-04-19 MED ORDER — CITALOPRAM HYDROBROMIDE 40 MG PO TABS
40.0000 mg | ORAL_TABLET | Freq: Every day | ORAL | 0 refills | Status: DC
  Filled 2021-04-19: qty 30, 30d supply, fill #0

## 2021-04-19 MED ORDER — LAMOTRIGINE 100 MG PO TABS
100.0000 mg | ORAL_TABLET | Freq: Every day | ORAL | 1 refills | Status: DC
  Filled 2021-04-19: qty 30, 30d supply, fill #0

## 2021-04-19 MED ORDER — ARIPIPRAZOLE 2 MG PO TABS
2.0000 mg | ORAL_TABLET | Freq: Every day | ORAL | 1 refills | Status: DC
  Filled 2021-04-19: qty 30, 30d supply, fill #0
  Filled 2021-05-30: qty 30, 30d supply, fill #1

## 2021-04-25 ENCOUNTER — Other Ambulatory Visit: Payer: Self-pay

## 2021-04-25 ENCOUNTER — Other Ambulatory Visit: Payer: Self-pay | Admitting: Gerontology

## 2021-04-25 DIAGNOSIS — J439 Emphysema, unspecified: Secondary | ICD-10-CM

## 2021-04-26 ENCOUNTER — Other Ambulatory Visit: Payer: Self-pay

## 2021-04-26 MED FILL — Albuterol Sulfate Inhal Aero 108 MCG/ACT (90MCG Base Equiv): RESPIRATORY_TRACT | 17 days supply | Qty: 6.7 | Fill #0 | Status: AC

## 2021-05-17 ENCOUNTER — Other Ambulatory Visit: Payer: Self-pay

## 2021-05-17 MED ORDER — LAMOTRIGINE 100 MG PO TABS
ORAL_TABLET | Freq: Every day | ORAL | 1 refills | Status: DC
Start: 2021-05-17 — End: 2021-09-21
  Filled 2021-05-17 – 2021-05-30 (×2): qty 30, 30d supply, fill #0

## 2021-05-17 MED ORDER — CITALOPRAM HYDROBROMIDE 40 MG PO TABS
ORAL_TABLET | Freq: Every day | ORAL | 1 refills | Status: DC
  Filled 2021-05-17 – 2021-08-11 (×3): qty 30, 30d supply, fill #0

## 2021-05-30 ENCOUNTER — Other Ambulatory Visit: Payer: Self-pay

## 2021-05-30 MED FILL — Albuterol Sulfate Inhal Aero 108 MCG/ACT (90MCG Base Equiv): RESPIRATORY_TRACT | 17 days supply | Qty: 6.7 | Fill #1 | Status: AC

## 2021-06-06 ENCOUNTER — Telehealth: Payer: Self-pay | Admitting: Pharmacist

## 2021-06-06 NOTE — Telephone Encounter (Signed)
06/06/2021 4:01:28 PM - Advair refill call to [facility] ?-- Arletha Pili - Monday, June 06, 2021 4:00 PM --Called [facility] to refill Advair order# [id number]  ?

## 2021-06-13 ENCOUNTER — Other Ambulatory Visit: Payer: Self-pay

## 2021-06-14 ENCOUNTER — Other Ambulatory Visit: Payer: Self-pay

## 2021-06-14 MED ORDER — LAMOTRIGINE 100 MG PO TABS
100.0000 mg | ORAL_TABLET | Freq: Every day | ORAL | 1 refills | Status: DC
  Filled 2021-06-14 – 2021-08-11 (×2): qty 30, 30d supply, fill #0

## 2021-06-14 MED ORDER — LAMOTRIGINE 100 MG PO TABS
ORAL_TABLET | ORAL | 1 refills | Status: DC
  Filled 2021-06-14: qty 30, 30d supply, fill #0

## 2021-06-17 ENCOUNTER — Other Ambulatory Visit: Payer: Self-pay

## 2021-06-20 ENCOUNTER — Other Ambulatory Visit: Payer: Self-pay

## 2021-06-23 ENCOUNTER — Telehealth: Payer: Self-pay | Admitting: Pharmacist

## 2021-06-23 NOTE — Telephone Encounter (Signed)
06/23/2021 10:22:43 AM - Spiriva refill call to [facility] ?-- Arletha Pili - Thursday, Jun 23, 2021 10:22 AM --Called in Spiriva refill to [facility]. Spoke to [place], meds will not ship to provider office, only to pt's home unless pt call and verbally them to ship to provider office. Allow 5-7 business days. ?

## 2021-07-08 ENCOUNTER — Telehealth: Payer: Self-pay | Admitting: *Deleted

## 2021-07-08 NOTE — Telephone Encounter (Signed)
LMTC to schedule Yearly Lung CA CT Scan. 

## 2021-08-09 ENCOUNTER — Other Ambulatory Visit: Payer: Self-pay | Admitting: Pharmacy Technician

## 2021-08-09 ENCOUNTER — Other Ambulatory Visit: Payer: Self-pay

## 2021-08-09 NOTE — Progress Notes (Signed)
Patient has prescription drug coverage with BCBS.  Erica Gomez Patient Advocate Specialist [facility]

## 2021-08-11 ENCOUNTER — Other Ambulatory Visit: Payer: Self-pay

## 2021-08-13 IMAGING — CT CT CHEST W/O CM
2 of 4 series · 15 of 36 positions shown, 18 images · non-contrast
Comparison: 12/12/2016 chest CT.

CLINICAL DATA: Follow-up pulmonary nodules.  COPD.

EXAM:
CT CHEST WITHOUT CONTRAST
TECHNIQUE: Multidetector CT imaging of the chest was performed following the
standard protocol without IV contrast.

[Series 2: chest 2.00 · axial · 0.62mm/px · z∈[-1212,-918]mm · 12 of 175 slices shown, 15 images]
[im 14/175  mediastinal]
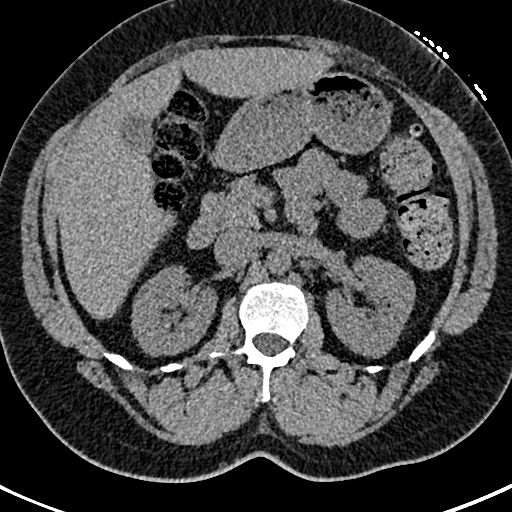
[im 14/175  lung]
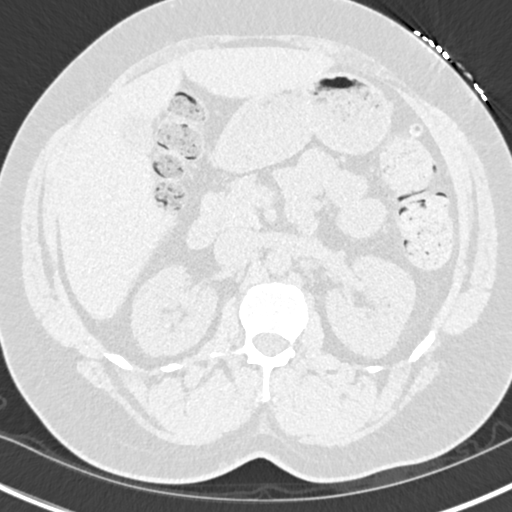
[im 27/175  lung]
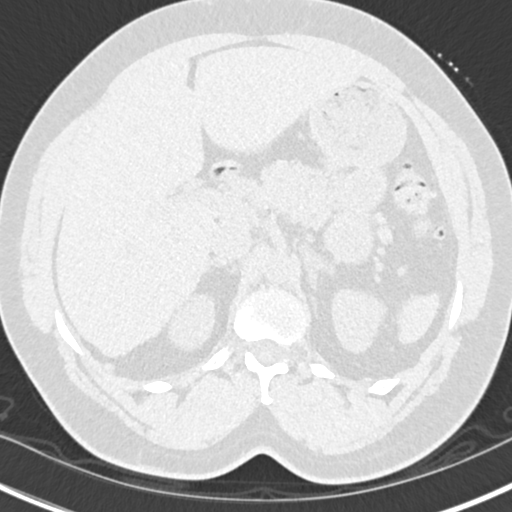
[im 41/175  lung]
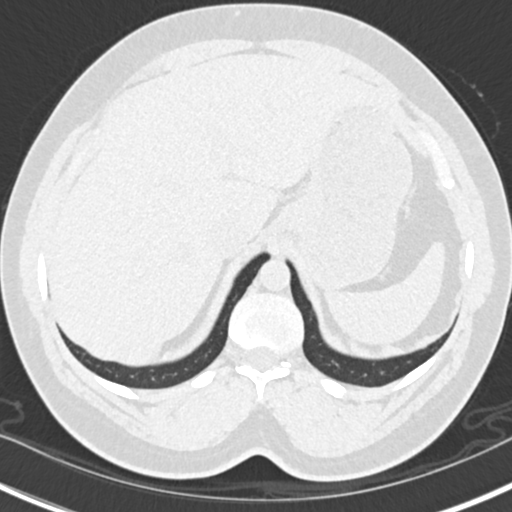
[im 54/175  lung]
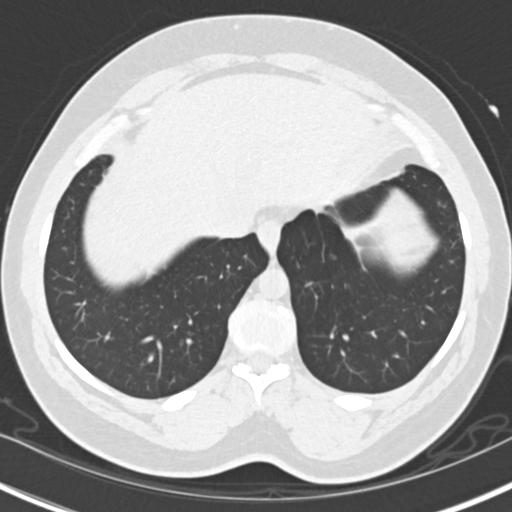
[im 67/175  mediastinal]
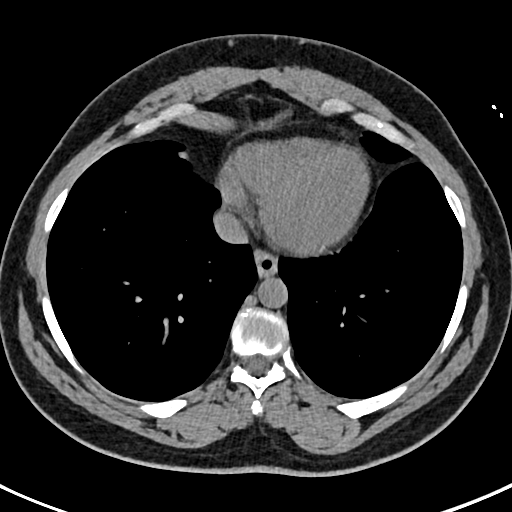
[im 67/175  lung]
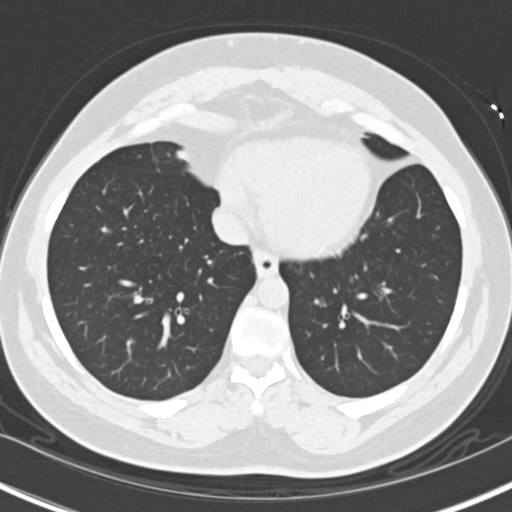
[im 81/175  lung]
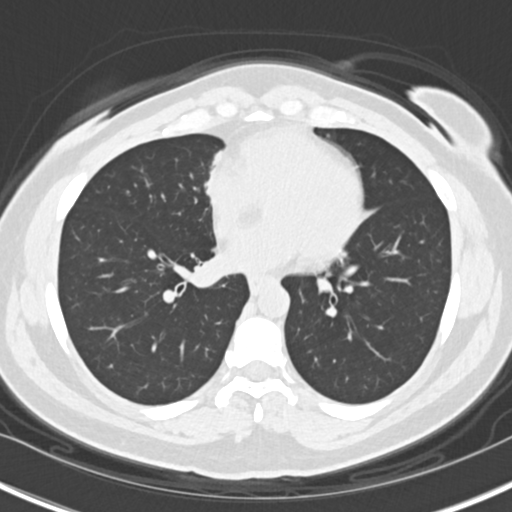
[im 94/175  lung]
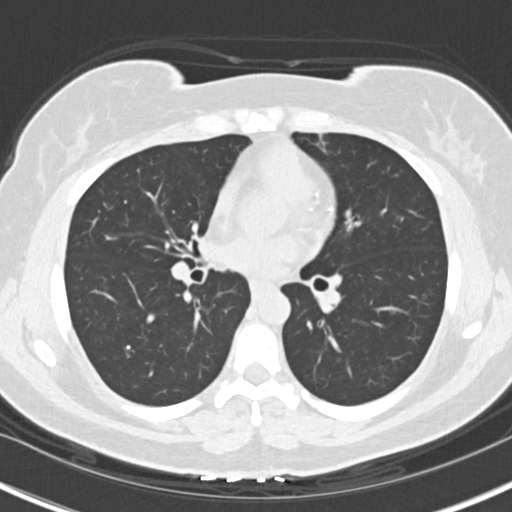
[im 108/175  lung]
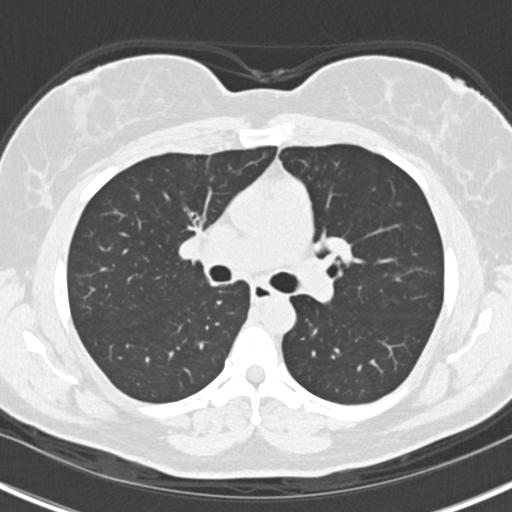
[im 121/175  mediastinal]
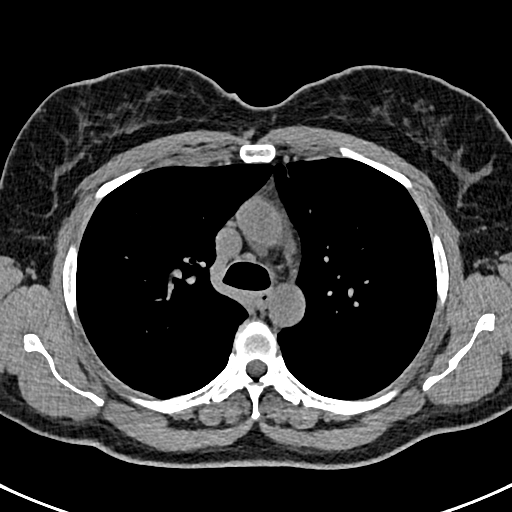
[im 121/175  lung]
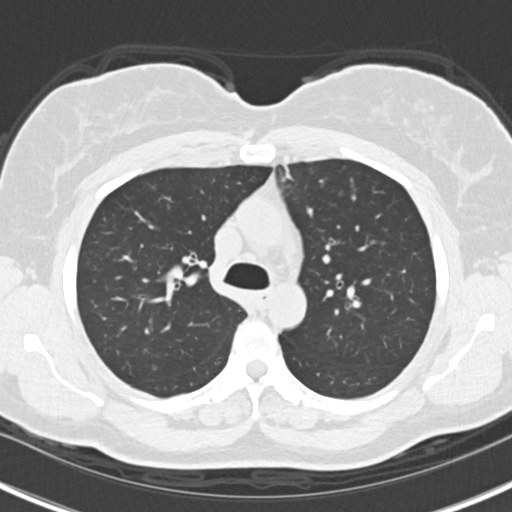
[im 134/175  lung]
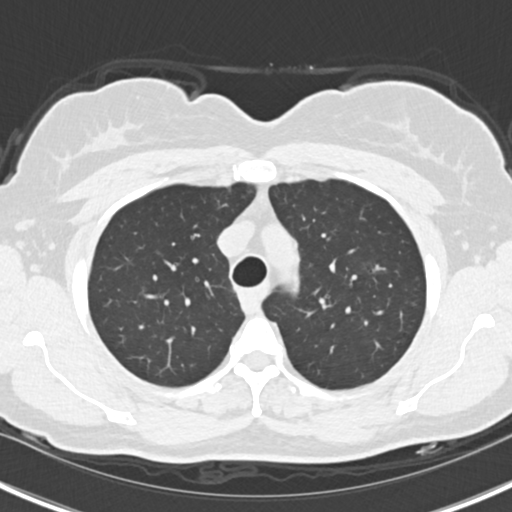
[im 148/175  lung]
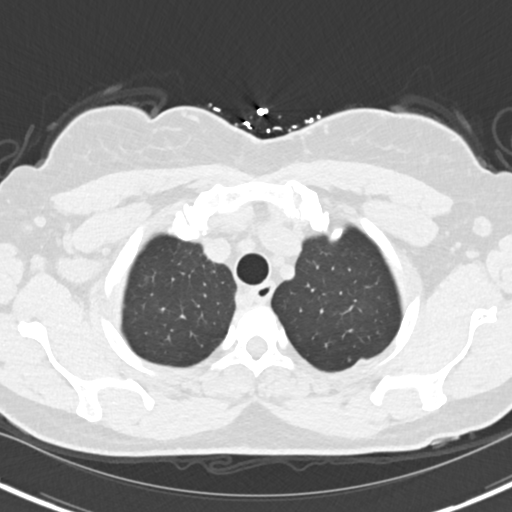
[im 161/175  lung]
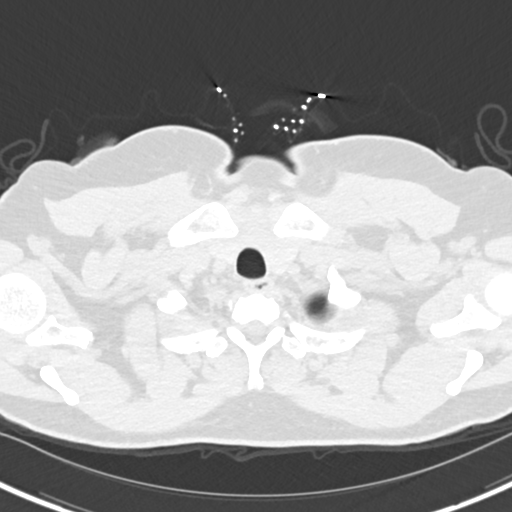

[Series 5: coronals chest 2.00 cor · coronal · 0.62mm/px · 3 of 142 slices shown]
[im 29/142  lung]
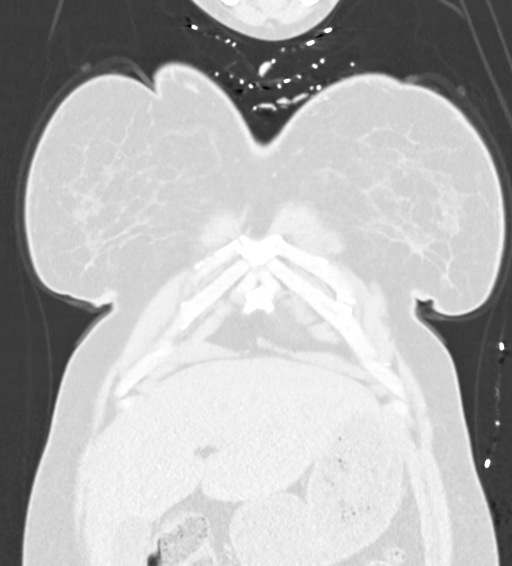
[im 57/142  lung]
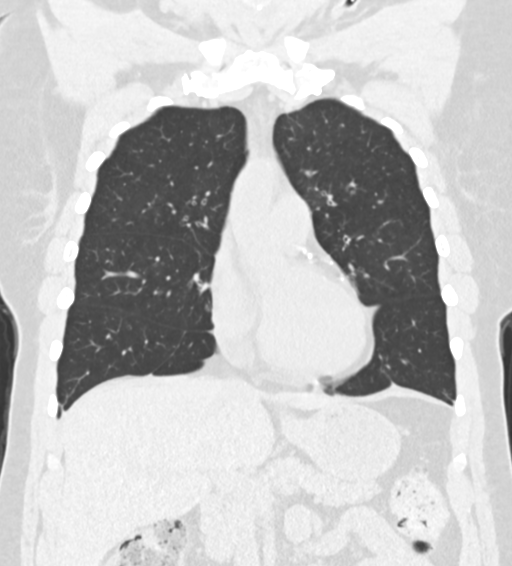
[im 85/142  lung]
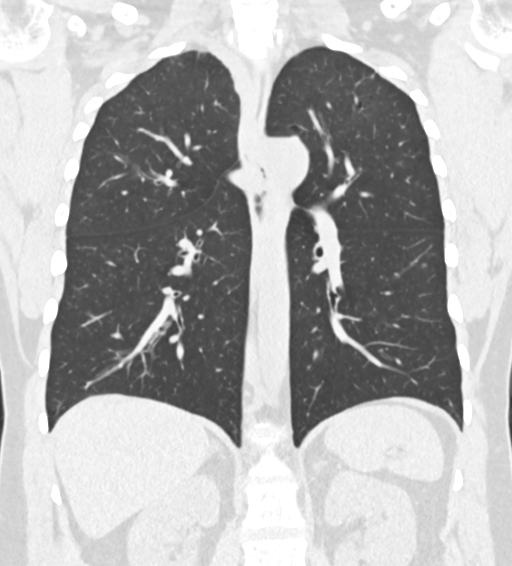

[15 of 36 positions shown; findings below may reference images not displayed]

FINDINGS: Cardiovascular: Normal heart size. No significant pericardial
effusion/thickening. Three-vessel coronary atherosclerosis. Aberrant
nonaneurysmal right subclavian artery arising from the distal aortic
arch with retroesophageal course. Normal course and caliber of the
thoracic aorta. Normal caliber pulmonary arteries.

Mediastinum/Nodes: No discrete thyroid nodules. Unremarkable
esophagus. Mild bilateral axillary adenopathy measuring up to 1.2 cm
on the right (series 2/image 32) and 1.1 cm on the left (series
2/image 28), not significantly changed. No pathologically enlarged
mediastinal nodes. No discrete hilar adenopathy on these noncontrast
images.

Lungs/Pleura: No pneumothorax. No pleural effusion. Mild
centrilobular emphysema with prominent diffuse bronchial wall
thickening. No acute consolidative airspace disease or lung masses.
Numerous centrilobular tiny ground-glass pulmonary nodules scattered
throughout both lungs, upper lung predominant, all unchanged,
largest 4 mm in the right upper lobe (series 3/image 45) and 4 mm in
the left lower lobe (series 3/image 83). No new significant
pulmonary nodules. Mild focal cystic bronchiectasis in the superior
segment left lower lobe (series 3/image 71), stable. Mild
cylindrical bronchiectasis in the medial upper lobes bilaterally,
stable.

Upper abdomen: Mild colonic diverticulosis.

Musculoskeletal:  No aggressive appearing focal osseous lesions.
IMPRESSION: 1. Several scattered upper lung predominant centrilobular tiny
CT, requiring no further follow-up. This recommendation follows the
consensus statement: Guidelines for Management of Incidental
Pulmonary Nodules Detected on CT Images: From the [HOSPITAL]
2. Mild centrilobular emphysema with prominent diffuse bronchial
wall thickening, compatible with the reported history of COPD.
3. Chronic mild bilateral bronchiectasis, stable.
4. Chronic mild bilateral axillary lymphadenopathy, stable,
presumably benign/reactive.
5. Aberrant right subclavian artery.
6. Three-vessel coronary atherosclerosis.

Emphysema ([id number]).

## 2021-08-15 ENCOUNTER — Other Ambulatory Visit: Payer: Self-pay

## 2021-08-18 ENCOUNTER — Other Ambulatory Visit: Payer: Self-pay

## 2021-09-14 ENCOUNTER — Other Ambulatory Visit: Payer: Medicaid Other

## 2021-09-20 ENCOUNTER — Ambulatory Visit: Payer: Medicaid Other | Admitting: Gerontology

## 2021-09-21 ENCOUNTER — Ambulatory Visit: Payer: Medicaid Other | Admitting: Gerontology

## 2021-09-21 ENCOUNTER — Encounter: Payer: Self-pay | Admitting: Gerontology

## 2021-09-21 ENCOUNTER — Other Ambulatory Visit: Payer: Self-pay

## 2021-09-21 VITALS — BP 120/76 | HR 99 | Temp 98.0°F | Resp 18 | Ht 66.0 in | Wt 180.0 lb

## 2021-09-21 DIAGNOSIS — J439 Emphysema, unspecified: Secondary | ICD-10-CM

## 2021-09-21 DIAGNOSIS — R21 Rash and other nonspecific skin eruption: Secondary | ICD-10-CM

## 2021-09-21 DIAGNOSIS — Z0000 Encounter for general adult medical examination without abnormal findings: Secondary | ICD-10-CM

## 2021-09-21 DIAGNOSIS — E119 Type 2 diabetes mellitus without complications: Secondary | ICD-10-CM

## 2021-09-21 LAB — POCT GLYCOSYLATED HEMOGLOBIN (HGB A1C): Hemoglobin A1C: 6.4 % — AB (ref 4.0–5.6)

## 2021-09-21 LAB — GLUCOSE, POCT (MANUAL RESULT ENTRY): POC Glucose: 171 mg/dl — AB (ref 70–99)

## 2021-09-21 MED ORDER — SPIRIVA HANDIHALER 18 MCG IN CAPS: 18.0000 ug | ORAL_CAPSULE | Freq: Every day | RESPIRATORY_TRACT | 11 refills | Status: DC

## 2021-09-21 MED ORDER — ROSUVASTATIN CALCIUM 10 MG PO TABS: ORAL_TABLET | Freq: Every day | ORAL | 1 refills | Status: DC

## 2021-09-21 MED ORDER — TRIAMCINOLONE ACETONIDE 0.1 % EX CREA: 1.0000 | TOPICAL_CREAM | Freq: Two times a day (BID) | CUTANEOUS | 0 refills | Status: DC

## 2021-09-21 MED ORDER — ALBUTEROL SULFATE HFA 108 (90 BASE) MCG/ACT IN AERS: INHALATION_SPRAY | RESPIRATORY_TRACT | 1 refills | Status: AC

## 2021-09-21 MED ORDER — FLUTICASONE-SALMETEROL 500-50 MCG/ACT IN AEPB: 1.0000 | INHALATION_SPRAY | Freq: Two times a day (BID) | RESPIRATORY_TRACT | 5 refills | Status: DC

## 2021-09-21 MED ORDER — METFORMIN HCL 500 MG PO TABS: ORAL_TABLET | Freq: Two times a day (BID) | ORAL | 1 refills | Status: DC

## 2021-09-21 NOTE — Progress Notes (Unsigned)
   Established Patient Office Visit  Subjective   Patient ID: Erica Gomez, female    DOB: 1965-06-27  Age: 56 y.o. MRN: [id number]  Chief Complaint  Patient presents with   Follow-up    Back itches consistently; wants a cream think it is eczema    HPI  Erica Gomez  is a 56 year old female with history of seizure, Allergy, Asthma, Bilateral Carpal Tunnel Syndrome, COPD, Hyperlipidemia, Tobacco use, T2DM presents for routine follow up visit and medication refill. Her HgbA1c done during visit increased from 6.2% to 6.4%.  She self discontinued Levetiracetam, denies seizure activities. She continues to smoke 1/2 pack of cigarette daily and states that she's continues to work on [employer].  She c/o pruritic pin point scattered erythematous rash to back that has been going on and off for many years.   Review of Systems  Constitutional: Negative.   Skin:  Positive for rash (scattered erythymatous pin point rash to back).      Objective:     BP 120/76 (BP Location: Right Arm, Patient Position: Sitting, Cuff Size: Large)   Pulse (!) 106   Temp 98 F (36.7 C) (Oral)   Resp 18   Ht '5\' 6"'$  (1.676 m)   Wt 180 lb (81.6 kg)   SpO2 92%   BMI 29.05 kg/m  {Vitals History (Optional):23777}  Physical Exam   No results found for any visits on 09/21/21.  {Labs (Optional):23779}  The 10-year ASCVD risk score (Arnett DK, et al., 2019) is: 9.7%    Assessment & Plan:   Problem List Items Addressed This Visit   None   No follow-ups on file.    Jorma Tassinari Jerold Coombe, NP

## 2021-09-22 ENCOUNTER — Other Ambulatory Visit: Payer: Self-pay | Admitting: Gerontology

## 2021-09-22 ENCOUNTER — Other Ambulatory Visit: Payer: Self-pay

## 2021-09-27 ENCOUNTER — Ambulatory Visit: Payer: Commercial Managed Care - HMO | Admitting: Podiatry

## 2021-10-05 ENCOUNTER — Ambulatory Visit: Payer: Commercial Managed Care - HMO | Admitting: Cardiology

## 2021-10-05 NOTE — Progress Notes (Deleted)
Cardiology Clinic Note   Patient Name: Erica Gomez Date of Encounter: 10/05/2021  Primary Care Provider:  Pcp, No Primary Cardiologist:  Ida Rogue, MD  Patient Profile    56 year old female with a past medical history of tussive syncope, coronary calcification, substance abuse, smoker 1/2 pack/day, type 2 diabetes, bipolar depression, seizures, who is here today to follow-up  Past Medical History    Past Medical History:  Diagnosis Date   Allergy    Anxiety    Asthma    Bilateral carpal tunnel syndrome 12/31/2018   Bipolar affective, manic ([place]) 1996   COPD (chronic obstructive pulmonary disease) ([place])    Depression    HLD (hyperlipidemia) 03/12/2018   Lung nodule, multiple 09/26/2016   Manic depressive illness ([place]) 1996   Seizures ([place])    Substance abuse ([place])    Tobacco abuse 10/04/2016   Past Surgical History:  Procedure Laterality Date   ABDOMINAL HYSTERECTOMY     CARPAL TUNNEL RELEASE Right 01/15/2019   Procedure: RIGHTCARPAL TUNNEL RELEASE, LEFT DEQUERVAINS INJECTION;  Surgeon: Leandrew Koyanagi, MD;  Location: [facility];  Service: Orthopedics;  Laterality: Right;   CARPAL TUNNEL RELEASE Left 02/26/2019   Procedure: LEFT CARPAL TUNNEL RELEASE;  Surgeon: Leandrew Koyanagi, MD;  Location: [facility];  Service: Orthopedics;  Laterality: Left;  Bier block   COLONOSCOPY WITH PROPOFOL N/A 11/28/2018   Procedure: COLONOSCOPY WITH PROPOFOL;  Surgeon: [name], MD;  Location: [facility] ENDOSCOPY;  Service: Gastroenterology;  Laterality: N/A;   TUBAL LIGATION      Allergies  Allergies  Allergen Reactions   Flax Seed Oil [Flax Seed Oil] Hives    Pt. Self reported   Trazodone Other (See Comments)    Bed wetting    History of Present Illness    56 year old female with a past medical history of tussive syncope, coronary calcification, substance abuse, smoker 1 to 2 packs/day, type 2 diabetes, bipolar/depression, seizures.  CT scan of  the chest in 2018 was significant coronary calcification three-vessel worse in the LAD as well as proximal RCA with no significant atherosclerosis in the aorta.  Previous echocardiogram in 2015 revealed LVEF of 60 to 65%.   Last time she was seen in the office by Dr. Rockey Situ 04/2020 where she had complaints of shortness of breath with overexertion.  She had been working to decrease her smoking, at that time she continued to smoke about 2 packs/day.  She was continued on cholesterol medicine cholesterol was at goal.  There were no new medication changes that were made and no outpatient testing that was scheduled.  She returns to clinic today    Home Medications    Current Outpatient Medications  Medication Sig Dispense Refill   albuterol (PROVENTIL HFA) 108 (90 Base) MCG/ACT inhaler Inhale 2 puffs into the lungs every 4-6 hours as needed. 20.1 g 1   amphetamine-dextroamphetamine (ADDERALL XR) 10 MG 24 hr capsule Take 10 mg by mouth daily.     ARIPiprazole (ABILIFY) 2 MG tablet Take 1 tablet (2 mg total) by mouth once daily. 30 tablet 1   citalopram (CELEXA) 40 MG tablet TAKE 1 TABLET BY MOUTH ONCE DAILY. 30 tablet 1   fluticasone-salmeterol (ADVAIR) 500-50 MCG/ACT AEPB Inhale 1 puff into the lungs in the morning and at bedtime. 60 each 5   ipratropium-albuterol (DUONEB) 0.5-2.5 (3) MG/3ML SOLN INHALE CONTENTS OF ONE VIAL (3ML TOTAL) USING NEBULIZER  ONCE EVERY 6 HOURS. 360 mL 2  lamoTRIgine (LAMICTAL) 100 MG tablet Take 1 tablet (100 mg total) by mouth 2 (two) times daily. 180 tablet 4   levETIRAcetam (KEPPRA) 500 MG tablet Take 1 tablet (500 mg total) by mouth 2 (two) times daily. 180 tablet 3   metFORMIN (GLUCOPHAGE) 500 MG tablet TAKE ONE TABLET BY MOUTH TWICE DAILY WITH MEALS. 120 tablet 1   rosuvastatin (CRESTOR) 10 MG tablet TAKE 1 TABLET BY MOUTH ONCE EVERY DAY. 90 tablet 1   tiotropium (SPIRIVA HANDIHALER) 18 MCG inhalation capsule Place 1 capsule (18 mcg total) into inhaler and inhale  once daily. 30 capsule 11   triamcinolone cream (KENALOG) 0.1 % Apply 1 Application topically 2 (two) times daily. 45 g 0   No current facility-administered medications for this visit.     Family History    Family History  Problem Relation Age of Onset   Stroke Mother    Other Mother        "blood clot"   Heart attack Father    CAD Father    Bipolar disorder Brother    Schizophrenia Brother    Schizophrenia Brother    Bipolar disorder Brother    She indicated that her mother is deceased. She indicated that her father is deceased. She indicated that both of her brothers are alive.  Social History    Social History   Socioeconomic History   Marital status: Married    Spouse name: Not on file   Number of children: 2   Years of education: taking some medical billing classes now   Highest education level: Not on file  Occupational History   Occupation: Caretaker  Tobacco Use   Smoking status: Every Day    Packs/day: 0.50    Years: 36.00    Total pack years: 18.00    Types: Cigarettes   Smokeless tobacco: Former    Types: Chew, Snuff    Quit date: 2018   Tobacco comments:    Using nicotine lozenges to try and help her quit smoking  Vaping Use   Vaping Use: Former   Quit date: 08/11/2016  Substance and Sexual Activity   Alcohol use: Yes    Comment: rare   Drug use: No    Types: "Crack" cocaine    Comment: history 2013   Sexual activity: Yes    Birth control/protection: Condom, None  Other Topics Concern   Not on file  Social History Narrative   Lives with her husband.   Right-handed.   Caffeine use: 2 cups daily.   Social Determinants of Health   Financial Resource Strain: Not on file  Food Insecurity: No Food Insecurity (09/21/2021)   Hunger Vital Sign    Worried About Running Out of Food in the Last Year: Never true    Ran Out of Food in the Last Year: Never true  Transportation Needs: No Transportation Needs (09/21/2021)   PRAPARE - Armed forces logistics/support/administrative officer (Medical): No    Lack of Transportation (Non-Medical): No  Physical Activity: Not on file  Stress: Not on file  Social Connections: Not on file  Intimate Partner Violence: Not on file     Review of Systems    General:  No chills, fever, night sweats or weight changes.  Cardiovascular:  No chest pain, dyspnea on exertion, edema, orthopnea, palpitations, paroxysmal nocturnal dyspnea. Dermatological: No rash, lesions/masses Respiratory: No cough, dyspnea Urologic: No hematuria, dysuria Abdominal:   No nausea, vomiting, diarrhea, bright red blood per rectum, melena, or  hematemesis Neurologic:  No visual changes, wkns, changes in mental status. All other systems reviewed and are otherwise negative except as noted above.     Physical Exam    VS:  There were no vitals taken for this visit. , BMI There is no height or weight on file to calculate BMI.     GEN: Well nourished, well developed, in no acute distress. HEENT: normal. Neck: Supple, no JVD, carotid bruits, or masses. Cardiac: RRR, no murmurs, rubs, or gallops. No clubbing, cyanosis, edema.  Radials/DP/PT 2+ and equal bilaterally.  Respiratory:  Respirations regular and unlabored, clear to auscultation bilaterally. GI: Soft, nontender, nondistended, BS + x 4. MS: no deformity or atrophy. Skin: warm and dry, no rash. Neuro:  Strength and sensation are intact. Psych: Normal affect.  Accessory Clinical Findings    ECG personally reviewed by me today- *** - No acute changes  Lab Results  Component Value Date   WBC 9.8 12/07/2019   HGB 12.8 12/07/2019   HCT 40.4 12/07/2019   MCV 93.3 12/07/2019   PLT 279 12/07/2019   Lab Results  Component Value Date   CREATININE 1.03 (H) 05/12/2020   BUN 9 05/12/2020   NA 142 05/12/2020   K 4.6 05/12/2020   CL 104 05/12/2020   CO2 22 05/12/2020   Lab Results  Component Value Date   ALT 17 05/12/2020   AST 14 05/12/2020   ALKPHOS 77 05/12/2020   BILITOT  <0.2 05/12/2020   Lab Results  Component Value Date   CHOL 139 12/24/2019   HDL 48 12/24/2019   LDLCALC 70 12/24/2019   TRIG 114 12/24/2019   CHOLHDL 2.9 12/24/2019    Lab Results  Component Value Date   HGBA1C 6.4 (A) 09/21/2021    Assessment & Plan   1.  ***  Jarrette Dehner, NP 10/05/2021, 8:07 AM

## 2021-10-06 ENCOUNTER — Ambulatory Visit (INDEPENDENT_AMBULATORY_CARE_PROVIDER_SITE_OTHER): Payer: Commercial Managed Care - HMO | Admitting: Podiatry

## 2021-10-06 DIAGNOSIS — Q828 Other specified congenital malformations of skin: Secondary | ICD-10-CM | POA: Diagnosis not present

## 2021-10-06 DIAGNOSIS — M778 Other enthesopathies, not elsewhere classified: Secondary | ICD-10-CM

## 2021-10-13 NOTE — Progress Notes (Signed)
Subjective:  Patient ID: Erica Gomez, female    DOB: 1965-02-21,  MRN: [id number]  Chief Complaint  Patient presents with   Callouses    56 y.o. female presents with the above complaint.  Patient presents with left submetatarsal 5 porokeratotic lesion with underlying capsulitis painful to touch is progressive gotten worse hurts with ambulation hurts with pressure she would like to have it debrided down she has not seen anyone as prior to seeing me hurts with pressing on it on her foot.  She stands a pretty good amount on her foot.  Pain scale 7 out of 10   Review of Systems: Negative except as noted in the HPI. Denies N/V/F/Ch.  Past Medical History:  Diagnosis Date   Allergy    Anxiety    Asthma    Bilateral carpal tunnel syndrome 12/31/2018   Bipolar affective, manic ([place]) 1996   COPD (chronic obstructive pulmonary disease) (HCC)    Depression    HLD (hyperlipidemia) 03/12/2018   Lung nodule, multiple 09/26/2016   Manic depressive illness ([place]) 1996   Seizures ([place])    Substance abuse ([place])    Tobacco abuse 10/04/2016    Current Outpatient Medications:    albuterol (PROVENTIL HFA) 108 (90 Base) MCG/ACT inhaler, Inhale 2 puffs into the lungs every 4-6 hours as needed., Disp: 20.1 g, Rfl: 1   amphetamine-dextroamphetamine (ADDERALL XR) 10 MG 24 hr capsule, Take 10 mg by mouth daily., Disp: , Rfl:    ARIPiprazole (ABILIFY) 2 MG tablet, Take 1 tablet (2 mg total) by mouth once daily., Disp: 30 tablet, Rfl: 1   citalopram (CELEXA) 40 MG tablet, TAKE 1 TABLET BY MOUTH ONCE DAILY., Disp: 30 tablet, Rfl: 1   fluticasone-salmeterol (ADVAIR) 500-50 MCG/ACT AEPB, Inhale 1 puff into the lungs in the morning and at bedtime., Disp: 60 each, Rfl: 5   ipratropium-albuterol (DUONEB) 0.5-2.5 (3) MG/3ML SOLN, INHALE CONTENTS OF ONE VIAL (3ML TOTAL) USING NEBULIZER  ONCE EVERY 6 HOURS., Disp: 360 mL, Rfl: 2   lamoTRIgine (LAMICTAL) 100 MG tablet, Take 1 tablet (100 mg total) by mouth 2 (two)  times daily., Disp: 180 tablet, Rfl: 4   levETIRAcetam (KEPPRA) 500 MG tablet, Take 1 tablet (500 mg total) by mouth 2 (two) times daily., Disp: 180 tablet, Rfl: 3   metFORMIN (GLUCOPHAGE) 500 MG tablet, TAKE ONE TABLET BY MOUTH TWICE DAILY WITH MEALS., Disp: 120 tablet, Rfl: 1   rosuvastatin (CRESTOR) 10 MG tablet, TAKE 1 TABLET BY MOUTH ONCE EVERY DAY., Disp: 90 tablet, Rfl: 1   tiotropium (SPIRIVA HANDIHALER) 18 MCG inhalation capsule, Place 1 capsule (18 mcg total) into inhaler and inhale once daily., Disp: 30 capsule, Rfl: 11   triamcinolone cream (KENALOG) 0.1 %, Apply 1 Application topically 2 (two) times daily., Disp: 45 g, Rfl: 0  Social History   Tobacco Use  Smoking Status Every Day   Packs/day: 0.50   Years: 36.00   Total pack years: 18.00   Types: Cigarettes  Smokeless Tobacco Former   Types: Chew, Snuff   Quit date: 2018  Tobacco Comments   Using nicotine lozenges to try and help her quit smoking    Allergies  Allergen Reactions   Flax Seed Oil [Flax Seed Oil] Hives    Pt. Self reported   Trazodone Other (See Comments)    Bed wetting   Objective:  There were no vitals filed for this visit. There is no height or weight on file to calculate BMI. Constitutional Well developed. Well nourished.  Vascular Dorsalis pedis pulses palpable bilaterally. Posterior tibial pulses palpable bilaterally. Capillary refill normal to all digits.  No cyanosis or clubbing noted. Pedal hair growth normal.  Neurologic Normal speech. Oriented to person, place, and time. Epicritic sensation to light touch grossly present bilaterally.  Dermatologic Hyperkeratotic lesion with central nucleated core noted.  Pain with range of motion of the fifth tarsometatarsal joint range of motion.  Deep intra-articular pain not noted.  Orthopedic: Normal joint ROM without pain or crepitus bilaterally. No visible deformities. No bony tenderness.   Radiographs: None Assessment:   1. Capsulitis of  left foot   2. Porokeratosis    Plan:  Patient was evaluated and treated and all questions answered.  Left fifth metatarsal base porokeratosis with capsulitis -" Signs and concerns were discussed with the patient extensive detail given the amount of pain that she is having she will benefit from a steroid injection to decrease acute inflammatory component associate with pain.  Patient agrees with plan like to proceed with steroid injection.  Using chisel blade and lesions that porokeratotic lesion was debrided down to healthy striated tissue followed by excision of central nucleated core.  No pinpoint bleeding noted.  No complication noted. -A steroid injection was performed at left fifth tarsometatarsal joint using 1% plain Lidocaine and 10 mg of Kenalog. This was well tolerated.   No follow-ups on file.

## 2021-12-05 ENCOUNTER — Encounter: Payer: Self-pay | Admitting: *Deleted

## 2021-12-15 NOTE — Progress Notes (Deleted)
New patient visit   Patient: Erica Gomez   DOB: 08/26/1965   56 y.o. Female  MRN: [id number] Visit Date: 12/16/2021  Today's healthcare provider: Eulis Foster, MD   No chief complaint on file.  Subjective    Erica Gomez is a 56 y.o. female who presents today as a new patient to establish care.  HPI   Encounter to Establish Care  Patient presents visit to establish care with new primary care physician Introduced myself and my role as primary We reviewed her medical, surgical, medications and social history additional problems were discussed as detailed below  ***   Specialists patient currently sees -[facility], currently being evaluated for pain and porokeratosis, LOV Aug 2023 and recently in [facility] Oct 2023  -Neurology for hx of seizures, current meds include lamictal, last seizure was ***    Past Medical History:  Diagnosis Date   Allergy    Anxiety    Asthma    Bilateral carpal tunnel syndrome 12/31/2018   Bipolar affective, manic ([place]) 1996   COPD (chronic obstructive pulmonary disease) ([place])    Depression    HLD (hyperlipidemia) 03/12/2018   Lung nodule, multiple 09/26/2016   Manic depressive illness ([place]) 1996   Seizures ([place])    Substance abuse ([place])    Tobacco abuse 10/04/2016   Past Surgical History:  Procedure Laterality Date   ABDOMINAL HYSTERECTOMY     CARPAL TUNNEL RELEASE Right 01/15/2019   Procedure: RIGHTCARPAL TUNNEL RELEASE, LEFT DEQUERVAINS INJECTION;  Surgeon: Leandrew Koyanagi, MD;  Location: [place];  Service: Orthopedics;  Laterality: Right;   CARPAL TUNNEL RELEASE Left 02/26/2019   Procedure: LEFT CARPAL TUNNEL RELEASE;  Surgeon: Leandrew Koyanagi, MD;  Location: [place];  Service: Orthopedics;  Laterality: Left;  Bier block   COLONOSCOPY WITH PROPOFOL N/A 11/28/2018   Procedure: COLONOSCOPY WITH PROPOFOL;  Surgeon: [name], MD;  Location: [place] [facility];  Service:  Gastroenterology;  Laterality: N/A;   TUBAL LIGATION     Family Status  Relation Name Status   Mother  Deceased at age 13       Blood Clot   Father  Deceased at age 36-50ish   Brother  38   Brother  Alive   Family History  Problem Relation Age of Onset   Stroke Mother    Other Mother        "blood clot"   Heart attack Father    CAD Father    Bipolar disorder Brother    Schizophrenia Brother    Schizophrenia Brother    Bipolar disorder Brother    Social History   Socioeconomic History   Marital status: Married    Spouse name: Not on file   Number of children: 2   Years of education: taking some medical billing classes now   Highest education level: Not on file  Occupational History   Occupation: Caretaker  Tobacco Use   Smoking status: Every Day    Packs/day: 0.50    Years: 36.00    Total pack years: 18.00    Types: Cigarettes   Smokeless tobacco: Former    Types: Chew, Snuff    Quit date: 2018   Tobacco comments:    Using nicotine lozenges to try and help her quit smoking  Vaping Use   Vaping Use: Former   Quit date: 08/11/2016  Substance and Sexual Activity   Alcohol use: Yes    Comment: rare  Drug use: No    Types: "Crack" cocaine    Comment: history 2013   Sexual activity: Yes    Birth control/protection: Condom, None  Other Topics Concern   Not on file  Social History Narrative   Lives with her husband.   Right-handed.   Caffeine use: 2 cups daily.   Social Determinants of Health   Financial Resource Strain: Not on file  Food Insecurity: No Food Insecurity (09/21/2021)   Hunger Vital Sign    Worried About Running Out of Food in the Last Year: Never true    Ran Out of Food in the Last Year: Never true  Transportation Needs: No Transportation Needs (09/21/2021)   PRAPARE - Hydrologist (Medical): No    Lack of Transportation (Non-Medical): No  Physical Activity: Not on file  Stress: Not on file  Social  Connections: Not on file   Outpatient Medications Prior to Visit  Medication Sig   albuterol (PROVENTIL HFA) 108 (90 Base) MCG/ACT inhaler Inhale 2 puffs into the lungs every 4-6 hours as needed.   amphetamine-dextroamphetamine (ADDERALL XR) 10 MG 24 hr capsule Take 10 mg by mouth daily.   ARIPiprazole (ABILIFY) 2 MG tablet Take 1 tablet (2 mg total) by mouth once daily.   citalopram (CELEXA) 40 MG tablet TAKE 1 TABLET BY MOUTH ONCE DAILY.   fluticasone-salmeterol (ADVAIR) 500-50 MCG/ACT AEPB Inhale 1 puff into the lungs in the morning and at bedtime.   ipratropium-albuterol (DUONEB) 0.5-2.5 (3) MG/3ML SOLN INHALE CONTENTS OF ONE VIAL (3ML TOTAL) USING NEBULIZER  ONCE EVERY 6 HOURS.   lamoTRIgine (LAMICTAL) 100 MG tablet Take 1 tablet (100 mg total) by mouth 2 (two) times daily.   levETIRAcetam (KEPPRA) 500 MG tablet Take 1 tablet (500 mg total) by mouth 2 (two) times daily.   metFORMIN (GLUCOPHAGE) 500 MG tablet TAKE ONE TABLET BY MOUTH TWICE DAILY WITH MEALS.   rosuvastatin (CRESTOR) 10 MG tablet TAKE 1 TABLET BY MOUTH ONCE EVERY DAY.   tiotropium (SPIRIVA HANDIHALER) 18 MCG inhalation capsule Place 1 capsule (18 mcg total) into inhaler and inhale once daily.   triamcinolone cream (KENALOG) 0.1 % Apply 1 Application topically 2 (two) times daily.   No facility-administered medications prior to visit.   Allergies  Allergen Reactions   Flax Seed Oil [Flax Seed Oil] Hives    Pt. Self reported   Trazodone Other (See Comments)    Bed wetting    Immunization History  Administered Date(s) Administered   Influenza,inj,Quad PF,6+ Mos 03/14/2018, 11/07/2018, 12/16/2019   Pneumococcal Polysaccharide-23 01/01/2019    Health Maintenance  Topic Date Due   COVID-19 Vaccine (1) Never done   OPHTHALMOLOGY EXAM  Never done   Hepatitis C Screening  Never done   TETANUS/TDAP  Never done   Zoster Vaccines- Shingrix (1 of 2) Never done   PAP SMEAR-Modifier  Never done   FOOT EXAM  05/11/2021    Diabetic kidney evaluation - GFR measurement  05/12/2021   INFLUENZA VACCINE  09/13/2021   COLONOSCOPY (Pts 45-59yr Insurance coverage will need to be confirmed)  11/27/2021   Diabetic kidney evaluation - Urine ACR  12/23/2021   MAMMOGRAM  01/19/2022   HEMOGLOBIN A1C  03/24/2022   HIV Screening  Completed   HPV VACCINES  Aged Out    Patient Care Team: Pcp, No as PCP - General GRockey Situ TKathlene November MD as PCP - Cardiology (Cardiology)  Review of Systems  {Labs  Heme  Chem  Endocrine  Serology  Results Review (optional):23779}   Objective    There were no vitals taken for this visit. {Show previous vital signs (optional):23777}  Physical Exam ***  Depression Screen     No data to display         No results found for any visits on 12/16/21.  Assessment & Plan      Problem List Items Addressed This Visit   None    No follow-ups on file.     I, Eulis Foster, MD, have reviewed all documentation for this visit. The documentation on 12/16/21 for the exam, diagnosis, procedures, and orders are all accurate and complete.  Portions of this information were initially documented by the CMA and reviewed by me for thoroughness and accuracy.     Eulis Foster, MD  [facility] [phone] (phone) [phone] (fax)  [place]

## 2021-12-16 ENCOUNTER — Ambulatory Visit: Payer: Commercial Managed Care - HMO | Admitting: Family Medicine

## 2022-04-04 ENCOUNTER — Other Ambulatory Visit: Payer: Self-pay | Admitting: Nurse Practitioner

## 2022-04-04 DIAGNOSIS — Z1231 Encounter for screening mammogram for malignant neoplasm of breast: Secondary | ICD-10-CM

## 2022-04-06 ENCOUNTER — Other Ambulatory Visit: Payer: Self-pay | Admitting: Nurse Practitioner

## 2022-04-06 DIAGNOSIS — N63 Unspecified lump in unspecified breast: Secondary | ICD-10-CM

## 2022-05-22 ENCOUNTER — Ambulatory Visit: Payer: Medicaid Other | Admitting: Podiatry

## 2022-05-22 ENCOUNTER — Encounter: Payer: Self-pay | Admitting: Podiatry

## 2022-05-22 VITALS — BP 144/84 | HR 91

## 2022-05-22 DIAGNOSIS — Q828 Other specified congenital malformations of skin: Secondary | ICD-10-CM

## 2022-05-23 NOTE — Progress Notes (Signed)
  Subjective:  Patient ID: Erica Gomez, female    DOB: January 11, 1966,  MRN: [id number]  Chief Complaint  Patient presents with   Callouses    "I put acid on the place on the side of my right foot, so it doesn't hurt anymore.  I have a callus on the bottom of my left foot."    57 y.o. female presents with the above complaint. History confirmed with patient.    Objective:  Physical Exam: warm, good capillary refill, no trophic changes or ulcerative lesions, normal DP and PT pulses, normal sensory exam, and Porokeratosis submetatarsal 5  Assessment:   1. Porokeratosis      Plan:  Patient was evaluated and treated and all questions answered.   Porokeratosiswas debrided as a courtesy.  Recommend she continue to utilize salicylic acid as this has worked well in the right foot.  Her diabetes is well-controlled.  Salinocaine applied today.  Return to see [place] as needed if it does not improve or worsens.

## 2022-06-12 ENCOUNTER — Ambulatory Visit: Payer: Commercial Managed Care - HMO | Admitting: Podiatry

## 2022-06-16 ENCOUNTER — Encounter: Payer: Self-pay | Admitting: Podiatry

## 2022-06-16 ENCOUNTER — Ambulatory Visit (INDEPENDENT_AMBULATORY_CARE_PROVIDER_SITE_OTHER): Payer: Medicaid Other | Admitting: Podiatry

## 2022-06-16 DIAGNOSIS — M778 Other enthesopathies, not elsewhere classified: Secondary | ICD-10-CM

## 2022-06-16 DIAGNOSIS — M216X2 Other acquired deformities of left foot: Secondary | ICD-10-CM

## 2022-06-16 DIAGNOSIS — M2010 Hallux valgus (acquired), unspecified foot: Secondary | ICD-10-CM

## 2022-06-16 DIAGNOSIS — Q828 Other specified congenital malformations of skin: Secondary | ICD-10-CM

## 2022-06-19 NOTE — Progress Notes (Signed)
This patient returns to my office for at risk foot care.  This patient requires this care by a professional since this patient will be at risk due to having diabetes.  She is concerned about her painful callus developing under her left forefoot.  .  This patient presents for at risk foot care today.  General Appearance  Alert, conversant and in no acute stress.  Vascular  Dorsalis pedis and posterior tibial  pulses are palpable  bilaterally.  Capillary return is within normal limits  bilaterally. Temperature is within normal limits  bilaterally.  Neurologic  Senn-Weinstein monofilament wire test within normal limits  bilaterally. Muscle power within normal limits bilaterally.  Nails Thick disfigured discolored nails with subungual debris  from hallux to fifth toes bilaterally. No evidence of bacterial infection or drainage bilaterally.  Orthopedic  No limitations of motion  feet .  No crepitus or effusions noted.  No bony pathology or digital deformities noted. HAV  B/L.  Plantar flexed second metatarsal sub 2,5  B/L.  Skin  normotropic skin with no porokeratosis noted bilaterally.  No signs of infections or ulcers noted.   Porokeratosis sub 2,5  B/L.  HAV  B/L  Plantarflexed metatarsal  B/L.  ROV.  HAV  B/L with plantar flexed metatarsal  B/L.   Return office visit    Surgical consult with   Dr.  Allena Katz.             Told patient to return for periodic foot care and evaluation due to potential at risk complications.   Helane Gunther DPM

## 2022-06-26 ENCOUNTER — Ambulatory Visit: Payer: Medicaid Other | Admitting: Internal Medicine

## 2022-06-26 ENCOUNTER — Encounter: Payer: Self-pay | Admitting: Internal Medicine

## 2022-06-26 VITALS — BP 130/90 | HR 76 | Temp 97.6°F | Resp 16 | Ht 66.0 in | Wt 167.0 lb

## 2022-06-26 DIAGNOSIS — J438 Other emphysema: Secondary | ICD-10-CM

## 2022-06-26 DIAGNOSIS — F3132 Bipolar disorder, current episode depressed, moderate: Secondary | ICD-10-CM

## 2022-06-26 DIAGNOSIS — G40909 Epilepsy, unspecified, not intractable, without status epilepticus: Secondary | ICD-10-CM | POA: Diagnosis not present

## 2022-06-26 DIAGNOSIS — F17200 Nicotine dependence, unspecified, uncomplicated: Secondary | ICD-10-CM | POA: Diagnosis not present

## 2022-06-26 DIAGNOSIS — F958 Other tic disorders: Secondary | ICD-10-CM

## 2022-06-26 NOTE — Progress Notes (Signed)
[facility] [address] [place], [place] [address]  Internal MEDICINE  Office Visit Note  Patient Name: Erica Gomez  [id number]  [id number]  Date of Service: 07/11/2022   Complaints/HPI Pt is here for pulmonary consult  Chief Complaint  Patient presents with   New Patient (Initial Visit)   Wheezing    Patient experiencing a wheezing noise throughout the day. She does smoke heavily, trying to quit.   Asthma   COPD   HPI Pulmonary consult:  Pt is here with c/o cough, she describes her cough uncontrolled, episodic bark like sound. It happens once in awhile, she does have periods of wheezing but she thinks its not related   She is a smoker, she is not sure if she wants to quit at the moment She thinks her inhalers work for her. She did have a low dose CT chest done in 2022, a follow up was recommended at the time Pt has been on Keppra in the past for ?? SZ disorder but not taking it at the moment She is seen by Psych( [facility]) for bipolar disorder  Current Medication: Outpatient Encounter Medications as of 06/26/2022  Medication Sig Note   albuterol (PROVENTIL HFA) 108 (90 Base) MCG/ACT inhaler Inhale 2 puffs into the lungs every 4-6 hours as needed.    amphetamine-dextroamphetamine (ADDERALL XR) 10 MG 24 hr capsule Take 10 mg by mouth daily.    citalopram (CELEXA) 40 MG tablet TAKE 1 TABLET BY MOUTH ONCE DAILY.    fluticasone-salmeterol (ADVAIR) 500-50 MCG/ACT AEPB Inhale 1 puff into the lungs in the morning and at bedtime.    lamoTRIgine (LAMICTAL) 100 MG tablet Take 1 tablet (100 mg total) by mouth 2 (two) times daily.    metFORMIN (GLUCOPHAGE) 500 MG tablet TAKE ONE TABLET BY MOUTH TWICE DAILY WITH MEALS.    rosuvastatin (CRESTOR) 10 MG tablet TAKE 1 TABLET BY MOUTH ONCE EVERY DAY.    tiotropium (SPIRIVA HANDIHALER) 18 MCG inhalation capsule Place 1 capsule (18 mcg total) into inhaler and inhale once daily.    triamcinolone cream (KENALOG) 0.1 % Apply 1  Application topically 2 (two) times daily.    [DISCONTINUED] ARIPiprazole (ABILIFY) 2 MG tablet Take 1 tablet (2 mg total) by mouth once daily.    [DISCONTINUED] levETIRAcetam (KEPPRA) 500 MG tablet Take 1 tablet (500 mg total) by mouth 2 (two) times daily. 09/21/2021: Have not taken in a while or had seizures   ipratropium-albuterol (DUONEB) 0.5-2.5 (3) MG/3ML SOLN INHALE CONTENTS OF ONE VIAL ( TOTAL) USING NEBULIZER  ONCE EVERY 6 HOURS.    [DISCONTINUED] atomoxetine (STRATTERA) 25 MG capsule Take 1 capsule (25 mg total) by mouth once every evening after dinner. (Patient not taking: Reported on 12/23/2020) 03/09/2021: Stopped by [facility] provider per Eunice Blase 03/08/21 via phone.   [DISCONTINUED] loratadine (CLARITIN) 10 MG tablet Take 1 tablet (10 mg total) by mouth daily as needed for allergies.    No facility-administered encounter medications on file as of 06/26/2022.    Surgical History: Past Surgical History:  Procedure Laterality Date   ABDOMINAL HYSTERECTOMY     CARPAL TUNNEL RELEASE Right 01/15/2019   Procedure: RIGHTCARPAL TUNNEL RELEASE, LEFT DEQUERVAINS INJECTION;  Surgeon: Tarry Kos, MD;  Location: [facility];  Service: Orthopedics;  Laterality: Right;   CARPAL TUNNEL RELEASE Left 02/26/2019   Procedure: LEFT CARPAL TUNNEL RELEASE;  Surgeon: Tarry Kos, MD;  Location: [facility];  Service: Orthopedics;  Laterality: Left;  Bier block  COLONOSCOPY WITH PROPOFOL N/A 11/28/2018   Procedure: COLONOSCOPY WITH PROPOFOL;  Surgeon: Wyline Mood, MD;  Location: [facility];  Service: Gastroenterology;  Laterality: N/A;   TUBAL LIGATION      Medical History: Past Medical History:  Diagnosis Date   Allergy    Anxiety    Asthma    Bilateral carpal tunnel syndrome 12/31/2018   Bipolar affective, manic (HCC) 1996   COPD (chronic obstructive pulmonary disease) (HCC)    Depression    HLD (hyperlipidemia) 03/12/2018   Lung nodule, multiple 09/26/2016   Manic  depressive illness (HCC) 1996   Seizures (HCC)    Substance abuse ([facility]CC)    Tobacco abuse 10/04/2016    Family History: Family History  Problem Relation Age of Onset   Stroke Mother    Other Mother        "blood clot"   Heart attack Father    CAD Father    Bipolar disorder Brother    Schizophrenia Brother    Schizophrenia Brother    Bipolar disorder Brother     Social History   Socioeconomic History   Marital status: Married    Spouse name: Not on file   Number of children: 2   Years of education: taking some medical billing classes now   Highest education level: Not on file  Occupational History   Occupation: Caretaker  Tobacco Use   Smoking status: Every Day    Packs/day: 0.50    Years: 36.00    Additional pack years: 0.00    Total pack years: 18.00    Types: Cigarettes   Smokeless tobacco: Former    Types: Chew, Snuff    Quit date: 2018   Tobacco comments:    Using nicotine lozenges to try and help her quit smoking. Typically will smoke 1.5 packs daily.  Vaping Use   Vaping Use: Former   Quit date: 08/11/2016  Substance and Sexual Activity   Alcohol use: Not Currently    Comment: rare   Drug use: No    Types: "Crack" cocaine    Comment: history 2013   Sexual activity: Yes    Birth control/protection: Condom, None  Other Topics Concern   Not on file  Social History Narrative   Lives with her husband.   Right-handed.   Caffeine use: 2 cups daily.   Social Determinants of Health   Financial Resource Strain: Not on file  Food Insecurity: No Food Insecurity (09/21/2021)   Hunger Vital Sign    Worried About Running Out of Food in the Last Year: Never true    Ran Out of Food in the Last Year: Never true  Transportation Needs: No Transportation Needs (09/21/2021)   PRAPARE - Administrator, Civil Service (Medical): No    Lack of Transportation (Non-Medical): No  Physical Activity: Not on file  Stress: Not on file  Social Connections: Not on file   Intimate Partner Violence: Not on file     Review of Systems  Constitutional:  Negative for chills, fatigue and unexpected weight change.  HENT:  Positive for postnasal drip. Negative for congestion, rhinorrhea, sneezing and sore throat.   Eyes:  Negative for redness.  Respiratory:  Negative for cough, chest tightness and shortness of breath.   Cardiovascular:  Negative for chest pain and palpitations.  Gastrointestinal:  Negative for abdominal pain, constipation, diarrhea, nausea and vomiting.  Genitourinary:  Negative for dysuria and frequency.  Musculoskeletal:  Negative for arthralgias, back pain, joint swelling and  neck pain.  Skin:  Negative for rash.  Neurological: Negative.  Negative for tremors and numbness.  Hematological:  Negative for adenopathy. Does not bruise/bleed easily.  Psychiatric/Behavioral:  Negative for behavioral problems (Depression), sleep disturbance and suicidal ideas. The patient is not nervous/anxious.     Vital Signs: BP (!) 130/90   Pulse 76   Temp 97.6 F (36.4 C)   Resp 16   Ht 5\' 6"  (1.676 m)   Wt 167 lb (75.8 kg)   SpO2 95%   BMI 26.95 kg/m    Physical Exam Constitutional:      Appearance: Normal appearance.  HENT:     Head: Normocephalic and atraumatic.     Nose: Nose normal.     Mouth/Throat:     Mouth: Mucous membranes are moist.     Pharynx: No posterior oropharyngeal erythema.  Eyes:     Extraocular Movements: Extraocular movements intact.     Pupils: Pupils are equal, round, and reactive to light.  Cardiovascular:     Pulses: Normal pulses.     Heart sounds: Normal heart sounds.  Pulmonary:     Effort: Pulmonary effort is normal.     Breath sounds: Normal breath sounds.  Neurological:     General: No focal deficit present.     Mental Status: She is alert.  Psychiatric:        Mood and Affect: Mood normal.        Behavior: Behavior normal.       Assessment/Plan: 1. Other emphysema (HCC) Will continue on  albuterol prn, Advair and servent for now  - Pulmonary Function Test; Future  2. Behavioral tic A definitive diagnosis is not made however patient description is very peculiar of cough ( tic)  3. Seizure disorder [facility]) Upon review of her records I am not sure if patient actually have a [place] seizure or absence seizure, need to look further into that  4. Nicotine dependence with current use Encourage smoking cessation  5. Bipolar affective disorder, currently depressed, moderate (HCC) Patient is on Lamictal but she was instructed to talk to her psychiatrist pt might need further testing to diagnosis  cough-like upper GI and or direct visualization of her upper airway  General Counseling: ismael wnek understanding of the findings of todays visit and agrees with plan of treatment. I have discussed any further diagnostic evaluation that may be needed or ordered today. We also reviewed her medications today. she has been encouraged to call the office with any questions or concerns that should arise related to todays visit.    Counseling:  [place] Controlled Substance Database was reviewed by me.  Orders Placed This Encounter  Procedures   Pulmonary Function Test    No orders of the defined types were placed in this encounter.   Time spent:35 Minutes

## 2022-07-04 ENCOUNTER — Ambulatory Visit: Payer: Medicaid Other | Admitting: Nurse Practitioner

## 2022-07-18 ENCOUNTER — Telehealth: Payer: Self-pay | Admitting: Internal Medicine

## 2022-07-18 NOTE — Telephone Encounter (Signed)
Left vm and sent mychart message to confirm 07/26/22 appointment-Toni 

## 2022-07-26 ENCOUNTER — Encounter: Payer: Medicaid Other | Admitting: Internal Medicine

## 2022-07-26 ENCOUNTER — Telehealth: Payer: Self-pay | Admitting: Internal Medicine

## 2022-07-26 NOTE — Telephone Encounter (Signed)
Husband will have patient call office to r/s missed pft and follow up appointment-Toni

## 2022-08-07 ENCOUNTER — Ambulatory Visit: Payer: Medicaid Other | Admitting: Internal Medicine

## 2022-08-23 ENCOUNTER — Encounter: Payer: Medicaid Other | Admitting: Internal Medicine

## 2022-08-29 ENCOUNTER — Telehealth: Payer: Self-pay | Admitting: Internal Medicine

## 2022-08-29 NOTE — Telephone Encounter (Signed)
Left vm and sent mychart message to confirm 09/06/22 appointment-Toni

## 2022-09-05 ENCOUNTER — Ambulatory Visit: Payer: Medicaid Other | Admitting: Internal Medicine

## 2022-09-06 ENCOUNTER — Encounter: Payer: Medicaid Other | Admitting: Internal Medicine

## 2022-09-19 ENCOUNTER — Ambulatory Visit: Payer: Medicaid Other | Admitting: Internal Medicine

## 2022-09-20 ENCOUNTER — Encounter: Payer: Medicaid Other | Admitting: Internal Medicine

## 2022-10-17 ENCOUNTER — Ambulatory Visit: Payer: Medicaid Other | Admitting: Internal Medicine

## 2022-11-21 ENCOUNTER — Other Ambulatory Visit: Payer: Self-pay

## 2022-11-21 MED ORDER — HYDROXYZINE HCL 50 MG PO TABS
50.0000 mg | ORAL_TABLET | Freq: Every evening | ORAL | 1 refills | Status: AC | PRN
  Filled 2022-11-21: qty 45, 30d supply, fill #0

## 2022-11-21 MED ORDER — LAMOTRIGINE 100 MG PO TABS
100.0000 mg | ORAL_TABLET | Freq: Every day | ORAL | 1 refills | Status: AC
  Filled 2022-11-21: qty 30, 30d supply, fill #0

## 2022-11-21 MED ORDER — CITALOPRAM HYDROBROMIDE 40 MG PO TABS
40.0000 mg | ORAL_TABLET | Freq: Every day | ORAL | 1 refills | Status: AC
  Filled 2022-11-21: qty 30, 30d supply, fill #0

## 2022-11-28 ENCOUNTER — Other Ambulatory Visit: Payer: Self-pay

## 2022-12-04 ENCOUNTER — Ambulatory Visit (INDEPENDENT_AMBULATORY_CARE_PROVIDER_SITE_OTHER): Payer: Medicaid Other | Admitting: Podiatry

## 2022-12-04 DIAGNOSIS — Z91199 Patient's noncompliance with other medical treatment and regimen due to unspecified reason: Secondary | ICD-10-CM

## 2022-12-04 NOTE — Progress Notes (Unsigned)
Cardiology Clinic Note   Date: 12/06/2022 ID: Erica Gomez, DOB [birth year], MRN [id number]  Primary Cardiologist:  Julien Nordmann, MD  Patient Profile    Erica Gomez is a 57 y.o. female who presents to the clinic today for delayed routine follow up.     Past medical history significant for: Coronary artery calcification. CT chest 06/09/2020: Coronary artery calcifications, heart size normal, no pericardial effusion. Hyperlipidemia. COPD. T2DM. Tobacco abuse. Seizures.     History of Present Illness    Erica Gomez is followed by Dr. Mariah Milling for the above outlined history.  She was first evaluated  on 02/23/2017 for chest pain at the request of Dr. Sherie Don.  Chest CT October 2018 was significant for three-vessel coronary calcification with mid to distal LAD calcification, proximal RCA.  Several month history of chest tightness worse when anxious and better with deep breathing and relaxation.  No symptoms with exertion.  Symptoms felt to be atypical.  She was offered Myoview or stress echo but declined.  Risk factor modification was discussed including smoking cessation and patient was provided with prescription for Chantix.  Upon follow-up January 2021 she was doing well with no complaints.  Patient was last seen in the office by Dr. Mariah Milling on 04/23/2020.  No medication changes were made at that time.  She was again counseled on the importance of smoking cessation.  Discussed the use of AI scribe software for clinical note transcription with the patient, who gave verbal consent to proceed.  The patient presents for routine follow-up. She reports chronic chest pain that occurs after chain smoking. She smokes 1 pack of cigarettes a day and is interested in quitting. She describes chest pain as tightness in the middle of her chest typically associated with shortness of breath. 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 She smokes  a lot less while at work and does not have episodes of chest tightness.  The patient also reports using an inhaler for asthma and COPD, but it only provides relief sometimes. She has been using nebulizer more frequently as she feels her asthma has flared. She has not seen a pulmonologist and requests a referral. She has occasional ankle edema but this has not been an issue for her in quite a while. Other than work she does not do any formal exercising.   The patient has been off her cholesterol medication, rosuvastatin, for over a year due to running out of refills. The patient's last cholesterol check showed an LDL of 70 and an HDL of 48 in 2021. The patient's blood pressure is well controlled with antihypertensives, with readings typically <130/80.         ROS: All other systems reviewed and are otherwise negative except as noted in History of Present Illness.  Studies Reviewed    EKG Interpretation Date/Time:  Wednesday December 06 2022 09:30:48 EDT Ventricular Rate:  93 PR Interval:  148 QRS Duration:  130 QT Interval:  422 QTC Calculation: 524 R Axis:   -60  Text Interpretation: Normal sinus rhythm Possible Left atrial enlargement Right bundle branch block Bifascicular block When compared with ECG of 04/23/2020 (not in [facility]) no significant changes Confirmed by Carlos Levering [phone]) on 12/06/2022 9:42:25 AM       Physical Exam    VS:  BP 130/82 (BP Location: Left Arm, Patient Position: Sitting, Cuff Size: Normal)   Pulse 93   Ht 5\' 6"  (  1.676 m)   Wt 160 lb 2 oz (72.6 kg)   SpO2 93%   BMI 25.84 kg/m  , BMI Body mass index is 25.84 kg/m.  GEN: Well nourished, well developed, in no acute distress. Neck: No JVD or carotid bruits. Cardiac:  RRR. No murmurs. No rubs or gallops.   Respiratory:  Respirations regular and unlabored. Clear to auscultation without rales, wheezing or rhonchi. GI: Soft, nontender, nondistended. Extremities: Radials/DP/PT 2+ and equal bilaterally. No  clubbing or cyanosis. No edema.  Skin: Warm and dry, no rash. Neuro: Strength intact.  Assessment & Plan      Coronary Artery Calcifications Coronary artery calcifications noted on CT scans from 2018 and 2022. Patient has been off rosuvastatin for over a year. LDL was 70 in 2021. -Order lipid panel today to assess current cholesterol levels. -Consider restarting rosuvastatin pending lipid panel results.  Chronic chest pain Patient reports chest tightness and shortness of breath that only occurs with chain smoking. 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  -Ischemic workup not indicated at this time.  Tobacco Abuse Patient reports smoking a pack a day and experiences chest tightness and shortness of breath with chain smoking. -Encouraged patient to gradually reduce smoking, starting with eliminating chain smoking. -Recommended use of stress management techniques as an alternative to smoking.  Chronic Obstructive Pulmonary Disease (COPD) Patient reports using inhaler and nebulizer more frequently recently due to increased shortness of breath secondary to asthma. She requests pulmonology referral as she has never seen one. -Refer to pulmonology for further evaluation and management.  Hyperlipidemia LDL in 2021 was 70. She has not been on rosuvastatin in over a year secondary to running out of refills.  -Will get lipid panel today. -Consider restarting rosuvastatin pending results.      Disposition: Lipid panel, BMP, CBC today. Return in 1 year or sooner as needed.          Signed, Etta Grandchild. Anthonia Monger, DNP, NP-C

## 2022-12-04 NOTE — Progress Notes (Signed)
1. No-show for appointment     

## 2022-12-06 ENCOUNTER — Ambulatory Visit: Payer: Medicaid Other | Attending: Student | Admitting: Student

## 2022-12-06 ENCOUNTER — Encounter: Payer: Self-pay | Admitting: Student

## 2022-12-06 VITALS — BP 130/82 | HR 93 | Ht 66.0 in | Wt 160.1 lb

## 2022-12-06 DIAGNOSIS — J449 Chronic obstructive pulmonary disease, unspecified: Secondary | ICD-10-CM

## 2022-12-06 DIAGNOSIS — E785 Hyperlipidemia, unspecified: Secondary | ICD-10-CM | POA: Diagnosis not present

## 2022-12-06 DIAGNOSIS — Z720 Tobacco use: Secondary | ICD-10-CM

## 2022-12-06 DIAGNOSIS — R079 Chest pain, unspecified: Secondary | ICD-10-CM | POA: Diagnosis not present

## 2022-12-06 DIAGNOSIS — I2510 Atherosclerotic heart disease of native coronary artery without angina pectoris: Secondary | ICD-10-CM | POA: Diagnosis not present

## 2022-12-06 DIAGNOSIS — G8929 Other chronic pain: Secondary | ICD-10-CM

## 2022-12-06 NOTE — Patient Instructions (Signed)
Medication Instructions:  No changes *If you need a refill on your cardiac medications before your next appointment, please call your pharmacy*   Lab Work: Your provider would like for you to have following labs drawn today Lipid, BMET, CBC.   If you have labs (blood work) drawn today and your tests are completely normal, you will receive your results only by: MyChart Message (if you have MyChart) OR A paper copy in the mail If you have any lab test that is abnormal or we need to change your treatment, we will call you to review the results.   Testing/Procedures: None ordered   Follow-Up: At [facility], you and your health needs are our priority.  As part of our continuing mission to provide you with exceptional heart care, we have created designated Provider Care Teams.  These Care Teams include your primary Cardiologist (physician) and Advanced Practice Providers (APPs -  Physician Assistants and Nurse Practitioners) who all work together to provide you with the care you need, when you need it.  We recommend signing up for the patient portal called "MyChart".  Sign up information is provided on this After Visit Summary.  MyChart is used to connect with patients for Virtual Visits (Telemedicine).  Patients are able to view lab/test results, encounter notes, upcoming appointments, etc.  Non-urgent messages can be sent to your provider as well.   To learn more about what you can do with MyChart, go to [email or web address].    Your next appointment:   12 month(s)  Provider:   You may see Julien Nordmann, MD or one of the following Advanced Practice Providers on your designated Care Team:   Nicolasa Ducking, NP Eula Listen, PA-C Cadence Fransico Michael, PA-C Charlsie Quest, NP    A referral has been placed to pulmonology

## 2022-12-07 LAB — BASIC METABOLIC PANEL WITH GFR
BUN/Creatinine Ratio: 11 (ref 9–23)
BUN: 12 mg/dL (ref 6–24)
CO2: 22 mmol/L (ref 20–29)
Calcium: 9.8 mg/dL (ref 8.7–10.2)
Chloride: 101 mmol/L (ref 96–106)
Creatinine, Ser: 1.08 mg/dL — ABNORMAL HIGH (ref 0.57–1.00)
Glucose: 123 mg/dL — ABNORMAL HIGH (ref 70–99)
Potassium: 4.8 mmol/L (ref 3.5–5.2)
Sodium: 139 mmol/L (ref 134–144)
eGFR: 60 mL/min/1.73

## 2022-12-07 LAB — CBC
Hematocrit: 41.6 % (ref 34.0–46.6)
Hemoglobin: 13.4 g/dL (ref 11.1–15.9)
MCH: 29.5 pg (ref 26.6–33.0)
MCHC: 32.2 g/dL (ref 31.5–35.7)
MCV: 92 fL (ref 79–97)
Platelets: 315 10*3/uL (ref 150–450)
RBC: 4.54 x10E6/uL (ref 3.77–5.28)
RDW: 12.2 % (ref 11.7–15.4)
WBC: 7.7 10*3/uL (ref 3.4–10.8)

## 2022-12-07 LAB — LIPID PANEL
Chol/HDL Ratio: 2.9 ratio (ref 0.0–4.4)
Cholesterol, Total: 190 mg/dL (ref 100–199)
HDL: 66 mg/dL (ref >39)
LDL Chol Calc (NIH): 110 mg/dL — ABNORMAL HIGH (ref 0–99)
Triglycerides: 79 mg/dL (ref 0–149)
VLDL Cholesterol Cal: 14 mg/dL (ref 5–40)

## 2022-12-11 ENCOUNTER — Telehealth: Payer: Self-pay | Admitting: Student

## 2022-12-11 NOTE — Telephone Encounter (Signed)
Parke Poisson, RN 12/07/2022  9:24 AM EDT     Left a message for the patient to call back.

## 2022-12-11 NOTE — Telephone Encounter (Signed)
Attempted to call the patient. No answer- I left a message to please call back.  

## 2022-12-11 NOTE — Telephone Encounter (Signed)
Erica Levering, NP 12/07/2022  9:06 AM EDT     Please let patient know kidney function is stable. Electrolytes and blood counts are normal. LDL (bad cholesterol) has increased since being off cholesterol medication. Please have patient start rosuvastatin 10 mg daily. She will need to come in for repeat lipid panel and LFTs end of December/beginning of January.   Thank you!   DW

## 2022-12-14 ENCOUNTER — Encounter: Payer: Self-pay | Admitting: Pulmonary Disease

## 2022-12-14 NOTE — Telephone Encounter (Signed)
Attempted to contact pt x 3. Results placed in the mail requesting pt contact our office to discuss.

## 2022-12-15 NOTE — Telephone Encounter (Signed)
Left message for call back.

## 2022-12-15 NOTE — Telephone Encounter (Signed)
Patient is returning call. Please advise? 

## 2022-12-27 NOTE — Telephone Encounter (Signed)
My Chart message sent

## 2023-01-29 ENCOUNTER — Ambulatory Visit: Payer: Medicaid Other | Admitting: Student in an Organized Health Care Education/Training Program

## 2023-01-29 VITALS — BP 126/70 | HR 100 | Temp 97.9°F | Ht 66.0 in | Wt 161.6 lb

## 2023-01-29 DIAGNOSIS — R0602 Shortness of breath: Secondary | ICD-10-CM | POA: Diagnosis not present

## 2023-01-29 DIAGNOSIS — F17200 Nicotine dependence, unspecified, uncomplicated: Secondary | ICD-10-CM

## 2023-01-29 DIAGNOSIS — F17210 Nicotine dependence, cigarettes, uncomplicated: Secondary | ICD-10-CM

## 2023-01-29 DIAGNOSIS — J438 Other emphysema: Secondary | ICD-10-CM | POA: Diagnosis not present

## 2023-01-29 MED ORDER — TRELEGY ELLIPTA 100-62.5-25 MCG/ACT IN AEPB: 1.0000 | INHALATION_SPRAY | Freq: Once | RESPIRATORY_TRACT | 6 refills | Status: AC

## 2023-01-29 NOTE — Patient Instructions (Signed)
The [facility] Quitline: Call 1-800-QUIT-NOW 575-707-4353). The [facility] Quitline is a free service for [employer]. Trained counselors are available from 8 am until 3 am, 365 days per year. Services are available in both [place] and [place].   Web Resources Free online support programs can help you track your progress and share experiences with others who are quitting. These are examples: www.becomeanex.org www.trytostop.org  www.smokefree.gov  www.[employer].com.br.aspx  [facility]: offers comprehensive in-person tobacco treatment counseling at [facility] ([address], [place] [address]).  Open to everyone. Virtual appointments available. Free parking. Call [phone] to schedule an appointment or [phone] for general information.    Tobacco Cessation Medications  Nicotine Replacement Therapy (NRT)  Nicotine is the addictive part of tobacco smoke, but not the most dangerous part. There are 7000 other toxins in cigarettes, including carbon monoxide, that cause disease. People do not generally become addicted to medication. Common problems: People don't use enough medication or stop too early. Medications are safe and effective. Overdose is very uncommon. Use medications as long as needed (3 months minimum). Some combinations work better than single medications. Long acting medications like the NRT patch and bupropion provide continuous treatment for withdrawal symptoms.  PLUS  Short acting medications like the NRT gum, lozenge, inhaler, and nasal spray help people to cope with breakthrough cravings.  ? Nicotine Patch  Place patch on hairless skin on upper body, including arms and back. Each day: discard old patch, shower, apply new patch to a different site. Apply hydrocortisone cream to mildly red/irritated areas. Call provider if rash develops. If patch causes sleep disturbance, remove patch  at bedtime and replace each morning after shower. Side effects may include: skin irritation, headache, insomnia, abnormal/vivid dreams.  ? Nicotine Gum  Chew gum slowly, park in cheek when peppery taste or tingling sensation begins (about 15-30 chews). When taste or tingling goes away, begin chewing again. Use until nicotine is gone (taste or tingle does not return, usually 30 minutes). Park in different areas of mouth. Nicotine is absorbed through the lining of the mouth. Use enough to control cravings, up to 24 pieces per day (if used alone). Avoid eating or drinking for 15 minutes before using and during use. Side effects may include: mouth/jaw soreness, hiccups, indigestion, hypersalivation.  If gum is not chewed correctly, additional side effects may include lightheadedness, nausea/vomiting, throat and mouth irritation.  ? Nicotine Lozenge  Allow to dissolve slowly in mouth (20-30 minutes). Do not chew or swallow. Nicotine release may cause a warm tingling sensation. Occasionally rotate to different areas of the mouth. Use enough to control cravings, up to 20 lozenges per day (if used alone). Avoid eating or drinking for 15 minutes before using and during use. Side effects may include: nausea, hiccups, cough, heartburn, headache, gas, insomnia.  ? Nicotine Nasal Spray Use 1 spray in each nostril (1 dose) and tilt head back for 1 minute. Do not sniff, swallow, or inhale through nose.  Use at least 8 doses (1 spray in each nostril) , up to 40 doses per day (if used alone). To reduce nasal irritation, spray on cotton swab and insert into nose. Side effects may include: nasal and/or throat irritation (hot, peppery, or burning sensation), nasal irritation, tearing, sneezing, cough, headache.  ? Nicotine Oral Inhaler (puffer) Inhale into the back of the throat or puff in short breaths. Do not inhale into the lungs.  Puff continuously for 20 minutes (about 80 puffs) until cartridge  is  empty. Change cartridge when it loses the "burning in throat" sensation (feels like air only). Open cartridges can be saved and used again within 24 hours. Use at least 6 and up to 16 cartridges per day (if used alone).  Avoid eating or drinking for 15 minutes before using and during use. Side effects may include: mouth and/or throat irritation, unpleasant taste, cough, nasal irritation, indigestion, hiccups, headache.  ? Chantix (varenicline) Days 1-3: Take one 0.5 mg white pill each morning for 3 days, one week before quit date. Days 4-7: Increase to one 0.5 mg white pill twice a day in morning and evening for 4 days.  On Day 8 (target quit date), increase to one 1 mg blue pill twice a day. Maintain this dose for a minimum of 3 months. Take with food and a full glass of water to reduce nausea. Be sure that the two doses are at least 8 hours apart, but try to take second dose early in the evening (i.e. 6 pm) to avoid sleep problems. Common side effects include: nausea, insomnia, headache, abnormal/vivid dreams. Tell your doctor if you have any history of psychiatric illness prior to starting Chantix.  STOP taking CHANTIX and contact a healthcare provider immediately if you experience agitation, hostility, depressed mood, changes in thoughts or behavior that are not typical for you, thinking about or attempting suicide, allergic or skin reactions including swelling, rash, redness, or peeling of the skin.  For patients who have heart disease: Smoking is a major risk factor for cardiovascular disease, and Chantix can help you quit smoking. Chantix may be associated with a small, increased risk of certain heart events in patients who have heart disease. If you have any new or worsening symptoms of heart disease while taking Chantix, such as shortness of breath or trouble breathing, new or worsening chest pain, or new or worsening pain in your legs when walking, call your doctor or get emergency medical  help immediately.  ? Wellbutrin / Zyban (bupropion) Take one 150 mg pill each morning for 3 days, one week before target quit date. On Day 4, increase to one 150 mg pill twice a day, morning and evening.  Maintain this dose for a minimum of 3 months. Be sure that the two doses are at least 8 hours apart, but try to take second dose early in the evening (i.e. 6 pm) to avoid sleep problems. Avoid or minimize use of alcohol when taking this medication. Common side effects include: dry mouth, headache, insomnia, nausea, weight loss.  Risk of seizure is 02/998. STOP taking BUPROPION and contact a healthcare provider immediately if you experience agitation, hostility, depressed mood, changes in thoughts or behavior that are not typical for you, thinking about or attempting suicide, allergic or skin reactions including swelling, rash, redness, or peeling of the skin.

## 2023-01-29 NOTE — Progress Notes (Signed)
Assessment & Plan:   #Shortness of breath #Presumed COPD/Asthma  Reports history of asthma and COPD but no documented PFT's in the record. Presenting with shortness of breath and cough, with faint end expiratory wheeze noted on exam. She is on advair and serevent (ICS/LABA + LABA ?!) which I will simplify to ICS/LABA/LAMA with Trelegy. Should she have issues with insurance I will switch to ICS/LABA (Advair) + LAMA (spiriva respimat). Will obtain pulmonary function testing to assess degree of obstruction and reversibility and re-evaluate symptoms on follow up. No eosinophilia noted on review of the medical record. I have also personally reviewed her LDCT of the chest from 05/2020 showing emphysema.  - Pulmonary Function Test [facility] Only; Future - Fluticasone-Umeclidin-Vilant (TRELEGY ELLIPTA) 100-62.5-25 MCG/ACT AEPB; Inhale 1 puff into the lungs once for 1 dose.  Dispense: 1 each; Refill: 6  #Tobacco Use Disorder  Patient has a long standing history of smoking and continues to today. I have counseled her on the importance of smoking cessation. Educational information regarding cessation aids provided.  Return in about 3 months (around 04/29/2023).  I spent 60 minutes caring for this patient today, including preparing to see the patient, obtaining a medical history , reviewing a separately obtained history, performing a medically appropriate examination and/or evaluation, counseling and educating the patient/family/caregiver, ordering medications, tests, or procedures, documenting clinical information in the electronic health record, and independently interpreting results (not separately reported/billed) and communicating results to the patient/family/caregiver. 4 minutes spent counseling patient regarding smoking cessation.  Raechel Chute, MD [facility] 01/29/2023 3:46 PM    End of visit medications:  Meds ordered this encounter  Medications    Fluticasone-Umeclidin-Vilant (TRELEGY ELLIPTA) 100-62.5-25 MCG/ACT AEPB    Sig: Inhale 1 puff into the lungs once for 1 dose.    Dispense:  1 each    Refill:  6     Current Outpatient Medications:    albuterol (PROVENTIL HFA) 108 (90 Base) MCG/ACT inhaler, Inhale 2 puffs into the lungs every 4-6 hours as needed., Disp: 20.1 g, Rfl: 1   amphetamine-dextroamphetamine (ADDERALL XR) 10 MG 24 hr capsule, Take 10 mg by mouth daily., Disp: , Rfl:    citalopram (CELEXA) 40 MG tablet, Take 1 tablet (40 mg total) by mouth daily., Disp: 30 tablet, Rfl: 1   fluticasone-salmeterol (ADVAIR) 500-50 MCG/ACT AEPB, Inhale 1 puff into the lungs in the morning and at bedtime., Disp: 60 each, Rfl: 5   Fluticasone-Umeclidin-Vilant (TRELEGY ELLIPTA) 100-62.5-25 MCG/ACT AEPB, Inhale 1 puff into the lungs once for 1 dose., Disp: 1 each, Rfl: 6   hydrOXYzine (ATARAX) 50 MG tablet, Take 1-1.5 tablets (50-75 mg total) by mouth at bedtime as needed for sleep, Disp: 45 tablet, Rfl: 1   lamoTRIgine (LAMICTAL) 100 MG tablet, Take 1 tablet (100 mg total) by mouth 2 (two) times daily., Disp: 180 tablet, Rfl: 4   lamoTRIgine (LAMICTAL) 100 MG tablet, Take 1 tablet (100 mg total) by mouth daily., Disp: 30 tablet, Rfl: 1   meloxicam (MOBIC) 15 MG tablet, Take 15 mg by mouth daily., Disp: , Rfl:    metFORMIN (GLUCOPHAGE) 500 MG tablet, TAKE ONE TABLET BY MOUTH TWICE DAILY WITH MEALS., Disp: 120 tablet, Rfl: 1   methocarbamol (ROBAXIN) 500 MG tablet, Take 500 mg by mouth at bedtime., Disp: , Rfl:    traMADol (ULTRAM) 50 MG tablet, Take 1 tablet by mouth every 6 (six) hours as needed., Disp: , Rfl:    triamcinolone cream (KENALOG) 0.1 %, Apply  1 Application topically 2 (two) times daily., Disp: 45 g, Rfl: 0   ipratropium-albuterol (DUONEB) 0.5-2.5 (3) MG/3ML SOLN, INHALE CONTENTS OF ONE VIAL ( TOTAL) USING NEBULIZER  ONCE EVERY 6 HOURS., Disp: 360 mL, Rfl: 2   tiotropium (SPIRIVA HANDIHALER) 18 MCG inhalation capsule, Place 1  capsule (18 mcg total) into inhaler and inhale once daily., Disp: 30 capsule, Rfl: 11   Subjective:   PATIENT ID: Erica Gomez GENDER: female DOB: May 19, 1965, MRN: [id number]  Chief Complaint  Patient presents with   pulmonary consult    Hx of asthma and COPD. C/o sob with exertion.     HPI  Patient is a pleasant 57 year old female with past medical history of CAD, T2DM seizure disorder, psychiatric disease, smoking and report of asthma/COPD who presents to clinic to establish care.  Patient reports main symptoms include cough productive of whitish sputum, wheeze, and shortness of breath. Shortness of breath is worse with exertion. No chest pain or chest tightness reported, no fever, chills, night sweats or weight loss. She reports being told she had asthma in her 17's. Denies any history of asthma growing up, denies recurrent chest infections, denies recurrent episodes of bronchitis, and denies recurrent asthma exacerbations. She is currently maintained on Advair and Serevent. Patient reports underlying psychiatric disorder obsessively watching videos of abscess and lipoma I&D on youtube.  She works for [employer]. She doesn't have occupational exposures. She has a heavy history of smoking, with at least 40 pack years of smoking history. She is currently smoking 0.75 packs a day.  Ancillary information including prior medications, full medical/surgical/family/social histories, and PFTs (when available) are listed below and have been reviewed.   Review of Systems  Constitutional:  Negative for chills, fever, malaise/fatigue and weight loss.  Respiratory:  Positive for cough, sputum production, shortness of breath and wheezing. Negative for hemoptysis.   Cardiovascular:  Negative for chest pain.     Objective:   Vitals:   01/29/23 1447  BP: 126/70  Pulse: 100  Temp: 97.9 F (36.6 C)  TempSrc: Temporal  SpO2: 97%  Weight: 161 lb 9.6 oz (73.3 kg)  Height: 5\' 6"  (1.676 m)    97% on RA BMI Readings from Last 3 Encounters:  01/29/23 26.08 kg/m  12/06/22 25.84 kg/m  06/26/22 26.95 kg/m   Wt Readings from Last 3 Encounters:  01/29/23 161 lb 9.6 oz (73.3 kg)  12/06/22 160 lb 2 oz (72.6 kg)  06/26/22 167 lb (75.8 kg)    Physical Exam Constitutional:      Appearance: Normal appearance.  Cardiovascular:     Rate and Rhythm: Normal rate and regular rhythm.     Pulses: Normal pulses.     Heart sounds: Normal heart sounds.  Pulmonary:     Breath sounds: Wheezing (faint end expiratory) present. No rhonchi.  Musculoskeletal:     Right lower leg: No edema.     Left lower leg: No edema.  Neurological:     General: No focal deficit present.     Mental Status: She is alert and oriented to person, place, and time. Mental status is at baseline.       Ancillary Information    Past Medical History:  Diagnosis Date   Allergy    Anxiety    Asthma    Bilateral carpal tunnel syndrome 12/31/2018   Bipolar affective, manic (HCC) 1996   COPD (chronic obstructive pulmonary disease) (HCC)    Depression    HLD (hyperlipidemia) 03/12/2018  Lung nodule, multiple 09/26/2016   Manic depressive illness (HCC) 1996   Seizures (HCC)    Substance abuse (HCC)    Tobacco abuse 10/04/2016     Family History  Problem Relation Age of Onset   Stroke Mother    Other Mother        "blood clot"   Heart attack Father    CAD Father    Bipolar disorder Brother    Schizophrenia Brother    Schizophrenia Brother    Bipolar disorder Brother      Past Surgical History:  Procedure Laterality Date   ABDOMINAL HYSTERECTOMY     CARPAL TUNNEL RELEASE Right 01/15/2019   Procedure: RIGHTCARPAL TUNNEL RELEASE, LEFT DEQUERVAINS INJECTION;  Surgeon: Tarry Kos, MD;  Location: [facility];  Service: Orthopedics;  Laterality: Right;   CARPAL TUNNEL RELEASE Left 02/26/2019   Procedure: LEFT CARPAL TUNNEL RELEASE;  Surgeon: Tarry Kos, MD;  Location: [facility];  Service: Orthopedics;  Laterality: Left;  Bier block   COLONOSCOPY WITH PROPOFOL N/A 11/28/2018   Procedure: COLONOSCOPY WITH PROPOFOL;  Surgeon: Wyline Mood, MD;  Location: [facility];  Service: Gastroenterology;  Laterality: N/A;   TUBAL LIGATION      Social History   Socioeconomic History   Marital status: Married    Spouse name: Not on file   Number of children: 2   Years of education: taking some medical billing classes now   Highest education level: Not on file  Occupational History   Occupation: Caretaker  Tobacco Use   Smoking status: Every Day    Current packs/day: 0.50    Average packs/day: 0.5 packs/day for 36.0 years (18.0 ttl pk-yrs)    Types: Cigarettes   Smokeless tobacco: Former    Types: Chew, Snuff    Quit date: 2018   Tobacco comments:    Using nicotine lozenges to try and help her quit smoking. Typically will smoke 1.5 packs daily.  Vaping Use   Vaping status: Former   Quit date: 08/11/2016  Substance and Sexual Activity   Alcohol use: Not Currently    Comment: rare   Drug use: No    Types: "Crack" cocaine    Comment: history 2013   Sexual activity: Yes    Birth control/protection: Condom, None  Other Topics Concern   Not on file  Social History Narrative   Lives with her husband.   Right-handed.   Caffeine use: 2 cups daily.   Social Drivers of Corporate investment banker Strain: Not on file  Food Insecurity: No Food Insecurity (09/21/2021)   Hunger Vital Sign    Worried About Running Out of Food in the Last Year: Never true    Ran Out of Food in the Last Year: Never true  Transportation Needs: No Transportation Needs (09/21/2021)   PRAPARE - Administrator, Civil Service (Medical): No    Lack of Transportation (Non-Medical): No  Physical Activity: Not on file  Stress: Not on file  Social Connections: Not on file  Intimate Partner Violence: Not on file     Allergies  Allergen Reactions   Flax Seed Oil [Flax  Seed Oil] Hives    Pt. Self reported   Other Hives    Pt. Self reported   Trazodone Other (See Comments)    Bed wetting  Other Reaction(s): Other (See Comments)    Bed wetting     CBC    Component Value Date/Time  WBC 7.7 12/06/2022 1022   WBC 9.8 12/07/2019 2212   RBC 4.54 12/06/2022 1022   RBC 4.33 12/07/2019 2212   HGB 13.4 12/06/2022 1022   HCT 41.6 12/06/2022 1022   PLT 315 12/06/2022 1022   MCV 92 12/06/2022 1022   MCV 90 11/12/2013 1733   MCH 29.5 12/06/2022 1022   MCH 29.6 12/07/2019 2212   MCHC 32.2 12/06/2022 1022   MCHC 31.7 12/07/2019 2212   RDW 12.2 12/06/2022 1022   RDW 13.4 11/12/2013 1733   LYMPHSABS 2.4 12/07/2019 2212   LYMPHSABS 3.3 (H) 11/13/2018 1028   MONOABS 0.7 12/07/2019 2212   EOSABS 0.2 12/07/2019 2212   EOSABS 0.3 11/13/2018 1028   BASOSABS 0.0 12/07/2019 2212   BASOSABS 0.0 11/13/2018 1028    Pulmonary Functions Testing Results:     No data to display          Outpatient Medications Prior to Visit  Medication Sig Dispense Refill   albuterol (PROVENTIL HFA) 108 (90 Base) MCG/ACT inhaler Inhale 2 puffs into the lungs every 4-6 hours as needed. 20.1 g 1   amphetamine-dextroamphetamine (ADDERALL XR) 10 MG 24 hr capsule Take 10 mg by mouth daily.     citalopram (CELEXA) 40 MG tablet Take 1 tablet (40 mg total) by mouth daily. 30 tablet 1   fluticasone-salmeterol (ADVAIR) 500-50 MCG/ACT AEPB Inhale 1 puff into the lungs in the morning and at bedtime. 60 each 5   hydrOXYzine (ATARAX) 50 MG tablet Take 1-1.5 tablets (50-75 mg total) by mouth at bedtime as needed for sleep 45 tablet 1   lamoTRIgine (LAMICTAL) 100 MG tablet Take 1 tablet (100 mg total) by mouth 2 (two) times daily. 180 tablet 4   lamoTRIgine (LAMICTAL) 100 MG tablet Take 1 tablet (100 mg total) by mouth daily. 30 tablet 1   meloxicam (MOBIC) 15 MG tablet Take 15 mg by mouth daily.     metFORMIN (GLUCOPHAGE) 500 MG tablet TAKE ONE TABLET BY MOUTH TWICE DAILY WITH MEALS. 120  tablet 1   methocarbamol (ROBAXIN) 500 MG tablet Take 500 mg by mouth at bedtime.     traMADol (ULTRAM) 50 MG tablet Take 1 tablet by mouth every 6 (six) hours as needed.     triamcinolone cream (KENALOG) 0.1 % Apply 1 Application topically 2 (two) times daily. 45 g 0   citalopram (CELEXA) 40 MG tablet TAKE 1 TABLET BY MOUTH ONCE DAILY. 30 tablet 1   ipratropium-albuterol (DUONEB) 0.5-2.5 (3) MG/3ML SOLN INHALE CONTENTS OF ONE VIAL ( TOTAL) USING NEBULIZER  ONCE EVERY 6 HOURS. 360 mL 2   tiotropium (SPIRIVA HANDIHALER) 18 MCG inhalation capsule Place 1 capsule (18 mcg total) into inhaler and inhale once daily. 30 capsule 11   No facility-administered medications prior to visit.

## 2023-03-13 ENCOUNTER — Encounter: Payer: Self-pay | Admitting: Podiatry

## 2023-03-13 ENCOUNTER — Ambulatory Visit (INDEPENDENT_AMBULATORY_CARE_PROVIDER_SITE_OTHER): Payer: Medicaid Other | Admitting: Podiatry

## 2023-03-13 DIAGNOSIS — D2372 Other benign neoplasm of skin of left lower limb, including hip: Secondary | ICD-10-CM

## 2023-03-13 DIAGNOSIS — M79675 Pain in left toe(s): Secondary | ICD-10-CM | POA: Diagnosis not present

## 2023-03-13 DIAGNOSIS — B351 Tinea unguium: Secondary | ICD-10-CM | POA: Diagnosis not present

## 2023-03-13 NOTE — Progress Notes (Signed)
Chief Complaint  Patient presents with   Callouses    "I have a corn or callus on the bottom of my left foot."   Nail Problem    "My toenail hurts here." N - painful toenail L - hallux left distal D - 3 weeks O - suddenly, getting worse C - tender A - shoes T - none    Subjective: 58 y.o. female presenting to the office today for evaluation of pain and tenderness associated to the left plantar forefoot secondary to symptomatic skin lesions.  She has had them debrided in the past and slowly the pain returns.  She has not done anything recently for treatment  Patient also states that she has very thick painful toenail to the left hallux nail plate.  It is very symptomatic and digging into her toenail.  She is concerned for possible ingrown   Past Medical History:  Diagnosis Date   Allergy    Anxiety    Asthma    Bilateral carpal tunnel syndrome 12/31/2018   Bipolar affective, manic (HCC) 1996   COPD (chronic obstructive pulmonary disease) (HCC)    Depression    HLD (hyperlipidemia) 03/12/2018   Lung nodule, multiple 09/26/2016   Manic depressive illness (HCC) 1996   Seizures (HCC)    Substance abuse (HCC)    Tobacco abuse 10/04/2016    Past Surgical History:  Procedure Laterality Date   ABDOMINAL HYSTERECTOMY     CARPAL TUNNEL RELEASE Right 01/15/2019   Procedure: RIGHTCARPAL TUNNEL RELEASE, LEFT DEQUERVAINS INJECTION;  Surgeon: Tarry Kos, MD;  Location: [facility];  Service: Orthopedics;  Laterality: Right;   CARPAL TUNNEL RELEASE Left 02/26/2019   Procedure: LEFT CARPAL TUNNEL RELEASE;  Surgeon: Tarry Kos, MD;  Location: [facility];  Service: Orthopedics;  Laterality: Left;  Bier block   COLONOSCOPY WITH PROPOFOL N/A 11/28/2018   Procedure: COLONOSCOPY WITH PROPOFOL;  Surgeon: Wyline Mood, MD;  Location: [facility];  Service: Gastroenterology;  Laterality: N/A;   TUBAL LIGATION      Allergies  Allergen Reactions   Flax Seed  Oil [Flax Seed Oil] Hives    Pt. Self reported   Other Hives    Pt. Self reported   Trazodone Other (See Comments)    Bed wetting  Other Reaction(s): Other (See Comments)    Bed wetting     Objective:  Physical Exam General: Alert and oriented x3 in no acute distress  Dermatology: Hyperkeratotic lesion(s) present on the plantar aspect of the left forefoot. Pain on palpation with a central nucleated core noted. Skin is warm, dry and supple bilateral lower extremities. Negative for open lesions or macerations.  Hyperkeratotic dystrophic nail also noted with associated tenderness to palpation to the left hallux  Vascular: Palpable pedal pulses bilaterally. No edema or erythema noted. Capillary refill within normal limits.  Neurological: Grossly intact via light touch  Musculoskeletal Exam: Pain on palpation at the keratotic lesion(s) noted. Range of motion within normal limits bilateral. Muscle strength 5/5 in all groups bilateral.  Assessment: 1.  Eccrine poroma left foot 2.  Pain due to onychomycosis of toenail left great toe   Plan of Care:  -Patient evaluated -Excisional debridement of keratoic lesion(s) using a chisel blade was performed without incident.  -Salicylic acid applied with a bandaid -Recommend OTC salicylic acid daily as needed -Mechanical debridement of the symptomatic toenail to the left great toe was performed today using a nail nipper as a courtesy for the patient.  Patient felt significant relief after debridement.  For now we will not pursue any partial nail matricectomy of the ingrowing incurvated portion of the nail since she felt significant relief with conservative care. -Return to the clinic PRN.   Felecia Shelling, DPM [facility]  Dr. Felecia Shelling, DPM    [address] [place], [place] [address]                Office [phone]  Fax [phone]

## 2023-05-02 ENCOUNTER — Other Ambulatory Visit: Payer: Self-pay

## 2023-05-02 ENCOUNTER — Ambulatory Visit: Payer: Medicaid Other | Admitting: Student in an Organized Health Care Education/Training Program

## 2023-05-02 ENCOUNTER — Encounter: Payer: Self-pay | Admitting: Student in an Organized Health Care Education/Training Program

## 2023-05-02 VITALS — BP 108/68 | HR 104 | Temp 97.6°F | Ht 66.0 in | Wt 158.8 lb

## 2023-05-02 DIAGNOSIS — F3132 Bipolar disorder, current episode depressed, moderate: Secondary | ICD-10-CM

## 2023-05-02 DIAGNOSIS — Z122 Encounter for screening for malignant neoplasm of respiratory organs: Secondary | ICD-10-CM

## 2023-05-02 DIAGNOSIS — F17210 Nicotine dependence, cigarettes, uncomplicated: Secondary | ICD-10-CM

## 2023-05-02 DIAGNOSIS — F17200 Nicotine dependence, unspecified, uncomplicated: Secondary | ICD-10-CM | POA: Diagnosis not present

## 2023-05-02 DIAGNOSIS — Z87891 Personal history of nicotine dependence: Secondary | ICD-10-CM

## 2023-05-02 DIAGNOSIS — J438 Other emphysema: Secondary | ICD-10-CM | POA: Diagnosis not present

## 2023-05-02 MED ORDER — ARNUITY ELLIPTA 100 MCG/ACT IN AEPB: 1.0000 | INHALATION_SPRAY | Freq: Every day | RESPIRATORY_TRACT | 12 refills | Status: DC

## 2023-05-02 MED ORDER — ANORO ELLIPTA 62.5-25 MCG/ACT IN AEPB: 1.0000 | INHALATION_SPRAY | Freq: Every day | RESPIRATORY_TRACT | 12 refills | Status: DC

## 2023-05-02 MED ORDER — ARNUITY ELLIPTA 200 MCG/ACT IN AEPB: 1.0000 | INHALATION_SPRAY | Freq: Every day | RESPIRATORY_TRACT | 12 refills | Status: DC

## 2023-05-02 NOTE — Progress Notes (Signed)
 Assessment & Plan:   # Emphysema (HCC)  Reports history of asthma and COPD but no documented PFT's in the record. She presents for follow up, and is currently maintained on Advair and Spiriva Handihaler. She is reporting shortness of breath with exertion but exam is unremarkable. She is skipping doses of her inhaler secondary to forgetting.  Discussed simplifying regimen, will consolidate inhalers with Anoro Ellipta once daily and Arnuity once daily for ICS/LABA/LAMA triple effect. She will have to schedule PFT's before follow up to assess degree of obstruction and for reversibility. No eosinophilia noted on review of the medical record. I have also personally reviewed her LDCT of the chest from 05/2020 showing emphysema.  - Fluticasone Furoate (ARNUITY ELLIPTA) 200 MCG/ACT AEPB; Inhale 1 puff into the lungs daily.  Dispense: 30 each; Refill: 12 - umeclidinium-vilanterol (ANORO ELLIPTA) 62.5-25 MCG/ACT AEPB; Inhale 1 puff into the lungs daily.  Dispense: 30 each; Refill: 12 - PFT's  #Tobacco use disorder  Patient has a long standing history of smoking and continues to today. Counseled her on the importance of smoking cessation. Will also refer to our internal lung cancer screening program.  -smoking cessation counseling -LDCT for lung cancer screening   Return in about 6 months (around 11/02/2023).  I spent 33 minutes caring for this patient today, including preparing to see the patient, obtaining a medical history , reviewing a separately obtained history, performing a medically appropriate examination and/or evaluation, counseling and educating the patient/family/caregiver, ordering medications, tests, or procedures, and documenting clinical information in the electronic health record. 3 minutes utilized to counsel patient on importance of smoking cessation.  Raechel Chute, MD [facility]   End of visit medications:  Meds ordered this encounter  Medications    Fluticasone Furoate (ARNUITY ELLIPTA) 100 MCG/ACT AEPB    Sig: Inhale 1 puff into the lungs daily.    Dispense:  30 each    Refill:  12   umeclidinium-vilanterol (ANORO ELLIPTA) 62.5-25 MCG/ACT AEPB    Sig: Inhale 1 puff into the lungs daily.    Dispense:  30 each    Refill:  12     Current Outpatient Medications:    albuterol (PROVENTIL HFA) 108 (90 Base) MCG/ACT inhaler, Inhale 2 puffs into the lungs every 4-6 hours as needed., Disp: 20.1 g, Rfl: 1   amphetamine-dextroamphetamine (ADDERALL XR) 10 MG 24 hr capsule, Take 10 mg by mouth daily., Disp: , Rfl:    citalopram (CELEXA) 40 MG tablet, Take 1 tablet (40 mg total) by mouth daily., Disp: 30 tablet, Rfl: 1   Fluticasone Furoate (ARNUITY ELLIPTA) 100 MCG/ACT AEPB, Inhale 1 puff into the lungs daily., Disp: 30 each, Rfl: 12   hydrOXYzine (ATARAX) 50 MG tablet, Take 1-1.5 tablets (50-75 mg total) by mouth at bedtime as needed for sleep, Disp: 45 tablet, Rfl: 1   lamoTRIgine (LAMICTAL) 100 MG tablet, Take 1 tablet (100 mg total) by mouth 2 (two) times daily., Disp: 180 tablet, Rfl: 4   lamoTRIgine (LAMICTAL) 100 MG tablet, Take 1 tablet (100 mg total) by mouth daily., Disp: 30 tablet, Rfl: 1   meloxicam (MOBIC) 15 MG tablet, Take 15 mg by mouth daily., Disp: , Rfl:    metFORMIN (GLUCOPHAGE) 500 MG tablet, TAKE ONE TABLET BY MOUTH TWICE DAILY WITH MEALS., Disp: 120 tablet, Rfl: 1   methocarbamol (ROBAXIN) 500 MG tablet, Take 500 mg by mouth at bedtime., Disp: , Rfl:    traMADol (ULTRAM) 50 MG tablet, Take 1 tablet by  mouth every 6 (six) hours as needed., Disp: , Rfl:    triamcinolone cream (KENALOG) 0.1 %, Apply 1 Application topically 2 (two) times daily., Disp: 45 g, Rfl: 0   umeclidinium-vilanterol (ANORO ELLIPTA) 62.5-25 MCG/ACT AEPB, Inhale 1 puff into the lungs daily., Disp: 30 each, Rfl: 12   ipratropium-albuterol (DUONEB) 0.5-2.5 (3) MG/3ML SOLN, INHALE CONTENTS OF ONE VIAL ( TOTAL) USING NEBULIZER  ONCE EVERY 6 HOURS., Disp: 360  mL, Rfl: 2   Subjective:   PATIENT ID: Erica Gomez GENDER: female DOB: 09-15-1965, MRN: [id number]  Chief Complaint  Patient presents with   Follow-up    Cough, shortness of breath and wheezing.     HPI  Patient is a pleasant 58 year old female with past medical history of CAD, T2DM seizure disorder, psychiatric disease, smoking and report of asthma/COPD presenting for follow up.  She continues to have symptoms, but was unable to pick up her Trelegy. PCP sent prescriptions for advair and spiriva handihaler that she's used occasionally, with improvement in symptoms. She has an occasional cough but no wheeze. Continues to have some exertional dyspnea. She continues to have shortness of breath with exertion. She continues to smoke a few cigarettes a day.   She reports being told she had asthma in her 7's. Denies any history of asthma growing up, denies recurrent chest infections, denies recurrent episodes of bronchitis, and denies recurrent asthma exacerbations. Patient reports underlying psychiatric disorder and was obsessively watching videos of pimple popping during our visit.  She works for [employer]. She doesn't have occupational exposures. She has a heavy history of smoking, with at least 40 pack years of smoking history. She is currently smoking 0.75 packs a day.  Ancillary information including prior medications, full medical/surgical/family/social histories, and PFTs (when available) are listed below and have been reviewed.   Review of Systems  Constitutional:  Negative for chills, fever, malaise/fatigue and weight loss.  Respiratory:  Positive for shortness of breath. Negative for cough, hemoptysis, sputum production and wheezing.   Cardiovascular:  Negative for chest pain.     Objective:   Vitals:   05/02/23 1433  BP: 108/68  Pulse: (!) 104  Temp: 97.6 F (36.4 C)  TempSrc: Temporal  SpO2: 96%  Weight: 158 lb 12.8 oz (72 kg)  Height: 5\' 6"  (1.676 m)   96%  on RA BMI Readings from Last 3 Encounters:  05/02/23 25.63 kg/m  01/29/23 26.08 kg/m  12/06/22 25.84 kg/m   Wt Readings from Last 3 Encounters:  05/02/23 158 lb 12.8 oz (72 kg)  01/29/23 161 lb 9.6 oz (73.3 kg)  12/06/22 160 lb 2 oz (72.6 kg)    Physical Exam Constitutional:      Appearance: Normal appearance.  Cardiovascular:     Rate and Rhythm: Normal rate and regular rhythm.     Pulses: Normal pulses.     Heart sounds: Normal heart sounds.  Pulmonary:     Breath sounds: No wheezing or rhonchi.  Musculoskeletal:     Right lower leg: No edema.     Left lower leg: No edema.  Neurological:     General: No focal deficit present.     Mental Status: She is alert and oriented to person, place, and time. Mental status is at baseline.       Ancillary Information    Past Medical History:  Diagnosis Date   Allergy    Anxiety    Asthma    Bilateral carpal tunnel syndrome 12/31/2018  Bipolar affective, manic (HCC) 1996   COPD (chronic obstructive pulmonary disease) (HCC)    Depression    HLD (hyperlipidemia) 03/12/2018   Lung nodule, multiple 09/26/2016   Manic depressive illness (HCC) 1996   Seizures (HCC)    Substance abuse (HCC)    Tobacco abuse 10/04/2016     Family History  Problem Relation Age of Onset   Stroke Mother    Other Mother        "blood clot"   Heart attack Father    CAD Father    Bipolar disorder Brother    Schizophrenia Brother    Schizophrenia Brother    Bipolar disorder Brother      Past Surgical History:  Procedure Laterality Date   ABDOMINAL HYSTERECTOMY     CARPAL TUNNEL RELEASE Right 01/15/2019   Procedure: RIGHTCARPAL TUNNEL RELEASE, LEFT DEQUERVAINS INJECTION;  Surgeon: Tarry Kos, MD;  Location: [facility];  Service: Orthopedics;  Laterality: Right;   CARPAL TUNNEL RELEASE Left 02/26/2019   Procedure: LEFT CARPAL TUNNEL RELEASE;  Surgeon: Tarry Kos, MD;  Location: [facility];  Service:  Orthopedics;  Laterality: Left;  Bier block   COLONOSCOPY WITH PROPOFOL N/A 11/28/2018   Procedure: COLONOSCOPY WITH PROPOFOL;  Surgeon: Wyline Mood, MD;  [facility];  Service: Gastroenterology;  Laterality: N/A;   TUBAL LIGATION      Social History   Socioeconomic History   Marital status: Married    Spouse name: Not on file   Number of children: 2   Years of education: taking some medical billing classes now   Highest education level: Not on file  Occupational History   Occupation: Caretaker  Tobacco Use   Smoking status: Every Day    Current packs/day: 1.00    Types: Cigarettes   Smokeless tobacco: Current    Types: Chew, Snuff    Last attempt to quit: 2018   Tobacco comments:    Using nicotine pouch to try and help her quit smoking. Typically will smoke 1.5 packs daily.  Vaping Use   Vaping status: Former   Quit date: 08/11/2016  Substance and Sexual Activity   Alcohol use: Not Currently    Comment: rare   Drug use: No    Types: "Crack" cocaine    Comment: history 2013   Sexual activity: Yes    Birth control/protection: Condom, None  Other Topics Concern   Not on file  Social History Narrative   Lives with her husband.   Right-handed.   Caffeine use: 2 cups daily.   Social Drivers of Corporate investment banker Strain: Not on file  Food Insecurity: No Food Insecurity (09/21/2021)   Hunger Vital Sign    Worried About Running Out of Food in the Last Year: Never true    Ran Out of Food in the Last Year: Never true  Transportation Needs: No Transportation Needs (09/21/2021)   PRAPARE - Administrator, Civil Service (Medical): No    Lack of Transportation (Non-Medical): No  Physical Activity: Not on file  Stress: Not on file  Social Connections: Not on file  Intimate Partner Violence: Not on file     Allergies  Allergen Reactions   Flax Seed Oil [Flax Seed Oil] Hives    Pt. Self reported   Other Hives    Pt. Self reported    Trazodone Other (See Comments)    Bed wetting  Other Reaction(s): Other (See Comments)    Bed  wetting     CBC    Component Value Date/Time   WBC 7.7 12/06/2022 1022   WBC 9.8 12/07/2019 2212   RBC 4.54 12/06/2022 1022   RBC 4.33 12/07/2019 2212   HGB 13.4 12/06/2022 1022   HCT 41.6 12/06/2022 1022   PLT 315 12/06/2022 1022   MCV 92 12/06/2022 1022   MCV 90 11/12/2013 1733   MCH 29.5 12/06/2022 1022   MCH 29.6 12/07/2019 2212   MCHC 32.2 12/06/2022 1022   MCHC 31.7 12/07/2019 2212   RDW 12.2 12/06/2022 1022   RDW 13.4 11/12/2013 1733   LYMPHSABS 2.4 12/07/2019 2212   LYMPHSABS 3.3 (H) 11/13/2018 1028   MONOABS 0.7 12/07/2019 2212   EOSABS 0.2 12/07/2019 2212   EOSABS 0.3 11/13/2018 1028   BASOSABS 0.0 12/07/2019 2212   BASOSABS 0.0 11/13/2018 1028    Pulmonary Functions Testing Results:     No data to display          Outpatient Medications Prior to Visit  Medication Sig Dispense Refill   albuterol (PROVENTIL HFA) 108 (90 Base) MCG/ACT inhaler Inhale 2 puffs into the lungs every 4-6 hours as needed. 20.1 g 1   amphetamine-dextroamphetamine (ADDERALL XR) 10 MG 24 hr capsule Take 10 mg by mouth daily.     citalopram (CELEXA) 40 MG tablet Take 1 tablet (40 mg total) by mouth daily. 30 tablet 1   hydrOXYzine (ATARAX) 50 MG tablet Take 1-1.5 tablets (50-75 mg total) by mouth at bedtime as needed for sleep 45 tablet 1   lamoTRIgine (LAMICTAL) 100 MG tablet Take 1 tablet (100 mg total) by mouth 2 (two) times daily. 180 tablet 4   lamoTRIgine (LAMICTAL) 100 MG tablet Take 1 tablet (100 mg total) by mouth daily. 30 tablet 1   meloxicam (MOBIC) 15 MG tablet Take 15 mg by mouth daily.     metFORMIN (GLUCOPHAGE) 500 MG tablet TAKE ONE TABLET BY MOUTH TWICE DAILY WITH MEALS. 120 tablet 1   methocarbamol (ROBAXIN) 500 MG tablet Take 500 mg by mouth at bedtime.     traMADol (ULTRAM) 50 MG tablet Take 1 tablet by mouth every 6 (six) hours as needed.     triamcinolone cream  (KENALOG) 0.1 % Apply 1 Application topically 2 (two) times daily. 45 g 0   fluticasone-salmeterol (ADVAIR) 500-50 MCG/ACT AEPB Inhale 1 puff into the lungs in the morning and at bedtime. 60 each 5   ipratropium-albuterol (DUONEB) 0.5-2.5 (3) MG/3ML SOLN INHALE CONTENTS OF ONE VIAL ( TOTAL) USING NEBULIZER  ONCE EVERY 6 HOURS. 360 mL 2   tiotropium (SPIRIVA HANDIHALER) 18 MCG inhalation capsule Place 1 capsule (18 mcg total) into inhaler and inhale once daily. 30 capsule 11   No facility-administered medications prior to visit.

## 2023-05-03 ENCOUNTER — Telehealth: Payer: Self-pay | Admitting: Student in an Organized Health Care Education/Training Program

## 2023-05-03 DIAGNOSIS — J438 Other emphysema: Secondary | ICD-10-CM

## 2023-05-03 MED ORDER — ANORO ELLIPTA 62.5-25 MCG/ACT IN AEPB: 1.0000 | INHALATION_SPRAY | Freq: Every day | RESPIRATORY_TRACT | 12 refills | Status: DC

## 2023-05-03 NOTE — Telephone Encounter (Signed)
 Prescription updated. Left detailed message for Ladene Artist at [facility]. Nothing further needed.

## 2023-05-03 NOTE — Telephone Encounter (Signed)
 Anuro needs clarification. Provider sent over a quantity of 30 but it come in quantity of 60. Pharmacy needs an okay to change it. Please contact Derrick at [phone]

## 2023-05-03 NOTE — Telephone Encounter (Signed)
 Pt states the work note she received yesterday needs a return to work date. She mentioned 05/25/23 which would give time to get PFT done. She will call back with the fax # it needs to be sent to

## 2023-05-03 NOTE — Telephone Encounter (Signed)
 [email or web address]

## 2023-05-07 NOTE — Telephone Encounter (Signed)
 Patient was not given a work note. Nothing further needed.

## 2023-05-10 ENCOUNTER — Emergency Department
Admission: EM | Admit: 2023-05-10 | Discharge: 2023-05-10 | Disposition: A | Discharge status: Home / Self Care | Attending: Emergency Medicine | Admitting: Emergency Medicine

## 2023-05-10 ENCOUNTER — Other Ambulatory Visit: Payer: Self-pay

## 2023-05-10 DIAGNOSIS — J449 Chronic obstructive pulmonary disease, unspecified: Secondary | ICD-10-CM | POA: Insufficient documentation

## 2023-05-10 DIAGNOSIS — Y92410 Unspecified street and highway as the place of occurrence of the external cause: Secondary | ICD-10-CM | POA: Diagnosis not present

## 2023-05-10 DIAGNOSIS — S46911A Strain of unspecified muscle, fascia and tendon at shoulder and upper arm level, right arm, initial encounter: Secondary | ICD-10-CM | POA: Diagnosis not present

## 2023-05-10 DIAGNOSIS — S4991XA Unspecified injury of right shoulder and upper arm, initial encounter: Secondary | ICD-10-CM | POA: Diagnosis present

## 2023-05-10 MED ORDER — NAPROXEN 500 MG PO TABS
500.0000 mg | ORAL_TABLET | Freq: Once | ORAL | Status: AC
  Administered 2023-05-10: 500 mg via ORAL
  Filled 2023-05-10: qty 1
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
## 2023-05-10 NOTE — Discharge Instructions (Signed)
 Apply an ice pack to the painful area of your right shoulder for 15 minutes at a time, 4-5 times per day for the next 2 days. Take naproxen 500mg  every 12 hours for pain control.

## 2023-08-06 ENCOUNTER — Encounter (HOSPITAL_COMMUNITY): Payer: Self-pay

## 2023-08-06 ENCOUNTER — Other Ambulatory Visit: Payer: Self-pay

## 2023-08-06 ENCOUNTER — Emergency Department (HOSPITAL_COMMUNITY)
Admission: EM | Admit: 2023-08-06 | Discharge: 2023-08-06 | Disposition: A | Discharge status: Home / Self Care | Payer: Worker's Compensation | Attending: Emergency Medicine | Admitting: Emergency Medicine

## 2023-08-06 DIAGNOSIS — M62830 Muscle spasm of back: Secondary | ICD-10-CM | POA: Insufficient documentation

## 2023-08-06 DIAGNOSIS — M62838 Other muscle spasm: Secondary | ICD-10-CM | POA: Insufficient documentation

## 2023-08-06 MED ORDER — METHOCARBAMOL 500 MG PO TABS
500.0000 mg | ORAL_TABLET | Freq: Once | ORAL | Status: AC
  Administered 2023-08-06: 500 mg via ORAL
  Filled 2023-08-06: qty 1

## 2023-08-06 MED ORDER — LIDOCAINE 5 % EX PTCH
2.0000 | MEDICATED_PATCH | CUTANEOUS | Status: DC
  Administered 2023-08-06: 2 via TRANSDERMAL
  Filled 2023-08-06: qty 2

## 2023-08-06 MED ORDER — KETOROLAC TROMETHAMINE 15 MG/ML IJ SOLN
15.0000 mg | Freq: Once | INTRAMUSCULAR | Status: AC
  Administered 2023-08-06: 15 mg via INTRAMUSCULAR
  Filled 2023-08-06: qty 1

## 2023-08-06 NOTE — ED Triage Notes (Signed)
 Pt lifted a box at work, on Wednesday, that was heavy and strained her right shoulder. On Thursday she started having spasms in the right arm and left bicep. Had to call out of work on Friday. Supposed to go back today but she can't due to the pain.

## 2023-08-06 NOTE — Discharge Instructions (Signed)
 Recommend calling your doctor's office this morning to discuss your current state. It seems the gabapentin  is not helping your pain. You may benefit from physical therapy.

## 2023-08-06 NOTE — ED Provider Notes (Signed)
 [facility] Provider Note   CSN: [id number] Arrival date & time: 08/06/23  9746     Patient presents with: Arm Injury   Erica Gomez is a 58 y.o. female.   58 year old female presents with support person at bedside with complaint of pain in her arms. Patient states about 3.5 weeks ago she was lifting a box at work that was very heavy and resulted in significant pain in her arms. She got what she needed and put the box down, informed her supervisor of her injury. Patient went to her PCP who recommended physical therapy, patient declined, was prescribed gabapentin  which she is taking without any relief of her pain. Pain is worse with any sort of movement. No reports of trauma.        Prior to Admission medications   Medication Sig Start Date End Date Taking? Authorizing Provider  albuterol  (PROVENTIL  HFA) 108 (90 Base) MCG/ACT inhaler Inhale 2 puffs into the lungs every 4-6 hours as needed. 09/21/21   Iloabachie, Chioma E, NP  amphetamine-dextroamphetamine (ADDERALL XR) 10 MG 24 hr capsule Take 10 mg by mouth daily.    [provider]  citalopram  (CELEXA ) 40 MG tablet Take 1 tablet (40 mg total) by mouth daily. 11/21/22     Fluticasone  Furoate (ARNUITY ELLIPTA ) 200 MCG/ACT AEPB Inhale 1 puff into the lungs daily. 05/02/23   Isadora Hose, MD  hydrOXYzine  (ATARAX ) 50 MG tablet Take 1-1.5 tablets (50-75 mg total) by mouth at bedtime as needed for sleep 11/21/22     ipratropium-albuterol  (DUONEB) 0.5-2.5 (3) MG/3ML SOLN INHALE CONTENTS OF ONE VIAL ( TOTAL) USING NEBULIZER  ONCE EVERY 6 HOURS. 11/02/20 12/06/22  Iloabachie, Chioma E, NP  lamoTRIgine  (LAMICTAL ) 100 MG tablet Take 1 tablet (100 mg total) by mouth 2 (two) times daily. 06/23/20   Gayland Lauraine PARAS, NP  lamoTRIgine  (LAMICTAL ) 100 MG tablet Take 1 tablet (100 mg total) by mouth daily. 11/21/22     meloxicam  (MOBIC ) 15 MG tablet Take 15 mg by mouth daily.    [provider]   metFORMIN  (GLUCOPHAGE ) 500 MG tablet TAKE ONE TABLET BY MOUTH TWICE DAILY WITH MEALS. 09/21/21   Iloabachie, Chioma E, NP  methocarbamol (ROBAXIN) 500 MG tablet Take 500 mg by mouth at bedtime.    [provider]  traMADol (ULTRAM) 50 MG tablet Take 1 tablet by mouth every 6 (six) hours as needed.    [provider]  triamcinolone  cream (KENALOG ) 0.1 % Apply 1 Application topically 2 (two) times daily. 09/21/21   Iloabachie, Chioma E, NP  umeclidinium-vilanterol (ANORO ELLIPTA ) 62.5-25 MCG/ACT AEPB Inhale 1 puff into the lungs daily. 05/03/23   Isadora Hose, MD  atomoxetine  (STRATTERA ) 25 MG capsule Take 1 capsule (25 mg total) by mouth once every evening after dinner. Patient not taking: Reported on 12/23/2020 10/26/20 03/09/21    loratadine  (CLARITIN ) 10 MG tablet Take 1 tablet (10 mg total) by mouth daily as needed for allergies. 12/16/19 12/25/19  Iloabachie, Chioma E, NP    Allergies: Flax seed oil [flax seed oil], Other, and Trazodone    Review of Systems Negative except as per HPI Updated Vital Signs BP 123/85   Pulse 87   Temp 97.7 F (36.5 C) (Temporal)   Resp 18   Ht 5' 6 (1.676 m)   Wt 84.4 kg   SpO2 98%   BMI 30.02 kg/m   Physical Exam Vitals and nursing note reviewed.  Constitutional:  General: She is not in acute distress.    Appearance: She is well-developed. She is not diaphoretic.  HENT:     Head: Normocephalic and atraumatic.   Cardiovascular:     Pulses: Normal pulses.  Pulmonary:     Effort: Pulmonary effort is normal.   Musculoskeletal:        General: Tenderness present. No signs of injury.       Arms:     Comments: Exam is very limited as the slightest touch results in severe pain for patient, even pinning my arm to the bed at one point as I attempted to palpate her back.    Skin:    General: Skin is warm and dry.     Findings: No bruising, erythema or rash.   Neurological:     Mental Status: She is alert and oriented to  person, place, and time.   Psychiatric:        Behavior: Behavior normal.     (all labs ordered are listed, but only abnormal results are displayed) Labs Reviewed - No data to display  EKG: None  Radiology: No results found.   Procedures   Medications Ordered in the ED  lidocaine  (LIDODERM ) 5 % 2 patch (2 patches Transdermal Patch Applied 08/06/23 0534)  methocarbamol (ROBAXIN) tablet 500 mg (500 mg Oral Given 08/06/23 0534)  ketorolac  (TORADOL ) 15 MG/ML injection 15 mg (15 mg Intramuscular Given 08/06/23 0535)                                    Medical Decision Making Risk Prescription drug management.   58 year old female with complaint of pain in her arms, found to have severe pain to the biceps and triceps areas bilaterally as well as thoracic back. 2+ radial pulses bilaterally.  Unusual presentation and severity of pain given history of injury.  It does not seem the gabapentin  is helping her pain and alternatives should be considered but recommend this discussion with her pcp as her gabapentin  will need to be tapered off is stopped. Highly encouraged PT as originally recommended by PCP. Support person at bedside is frustrated, requesting blood work and MRI for her work up. Discussed limits of blood work and MRI in the ER for muscle spasms. Support person is frustrated with work up done at [facility], I do not see any records for a recent visit to [facility]. He is also frustrated patient was told that she has arthritis in her shoulders. I was able to locate an XR of the right shoulder form 09/26/2017 with suggestion of mild degenerative changes, patient adds she had a neck injury several years ago.  Provided with lidoderm  patches in the ER, robaxin, IM toradol  with plan to contact PCP in the morning.      Final diagnoses:  Muscle spasm of back  Muscle spasm of shoulder region    ED Discharge Orders     None          Erica Gomez 08/06/23 0547    Raford Lenis,  MD 08/06/23 [phone]

## 2023-08-06 NOTE — ED Notes (Signed)
 AVS provided by edp was reviewed with pt. Pt verbalized understanding with no additional questions at this time. Pt wheelchair to car going home with s/o at bedside

## 2023-08-16 ENCOUNTER — Encounter: Payer: Self-pay | Admitting: Acute Care

## 2023-10-31 ENCOUNTER — Other Ambulatory Visit: Payer: Self-pay | Admitting: Family Medicine

## 2023-10-31 DIAGNOSIS — N63 Unspecified lump in unspecified breast: Secondary | ICD-10-CM

## 2023-11-08 ENCOUNTER — Ambulatory Visit: Admitting: Student in an Organized Health Care Education/Training Program

## 2023-11-13 ENCOUNTER — Other Ambulatory Visit (HOSPITAL_COMMUNITY): Payer: Self-pay

## 2023-11-13 ENCOUNTER — Telehealth: Payer: Self-pay

## 2023-11-13 NOTE — Telephone Encounter (Signed)
*  Pulm  Pharmacy Patient Advocate Encounter   Received notification from Fax that prior authorization for Arnuity Ellipta  100MCG/ACT aerosol powder  is required/requested.   Insurance verification completed.   The patient is insured through [facility] .   Per test claim: PA required; PA submitted to above mentioned insurance via Latent Key/confirmation #/EOC [id number] Status is pending

## 2023-11-13 NOTE — Telephone Encounter (Signed)
 Called plan due to message received about PA not needed, but test claim shows still receiving PA notification.  Rep stated that it should be good to fill tomorrow and nothing further is needed.

## 2023-12-05 ENCOUNTER — Other Ambulatory Visit: Payer: Self-pay

## 2023-12-06 ENCOUNTER — Ambulatory Visit (INDEPENDENT_AMBULATORY_CARE_PROVIDER_SITE_OTHER): Admitting: Student in an Organized Health Care Education/Training Program

## 2023-12-06 ENCOUNTER — Encounter: Payer: Self-pay | Admitting: Student in an Organized Health Care Education/Training Program

## 2023-12-06 DIAGNOSIS — F17200 Nicotine dependence, unspecified, uncomplicated: Secondary | ICD-10-CM

## 2023-12-06 DIAGNOSIS — J438 Other emphysema: Secondary | ICD-10-CM

## 2023-12-06 DIAGNOSIS — F17210 Nicotine dependence, cigarettes, uncomplicated: Secondary | ICD-10-CM | POA: Diagnosis not present

## 2023-12-06 MED ORDER — UMECLIDINIUM-VILANTEROL 62.5-25 MCG/ACT IN AEPB: 1.0000 | INHALATION_SPRAY | Freq: Every day | RESPIRATORY_TRACT | 12 refills | Status: AC

## 2023-12-06 MED ORDER — NICOTINE POLACRILEX 2 MG MT LOZG: 2.0000 mg | LOZENGE | OROMUCOSAL | 3 refills | Status: AC | PRN

## 2023-12-06 MED ORDER — ARNUITY ELLIPTA 200 MCG/ACT IN AEPB: 1.0000 | INHALATION_SPRAY | Freq: Every day | RESPIRATORY_TRACT | 12 refills | Status: AC

## 2023-12-06 NOTE — Progress Notes (Signed)
 Assessment & Plan:   #Emphysema (HCC) #Asthma-COPD overlap syndrome    Asthma-COPD overlap syndrome recently exacerbated due to interrupted Arnuity therapy, causing shortness of breath, coughing, and wheezing. Symptoms were previously controlled with Arnuity and Anoro Ellipta . Insurance issues caused a temporary discontinuation of Arnuity, leading to symptom recurrence. Symptoms are expected to improve with the resumption of ICS with Arnuity. Will re-prescribe today. She's also not had her PFT's and we will re-order these today. Finally, she was referred to our lung cancer screening program and initial CT order placed, but patient hasn't scheduled yet. Have asked her to schedule CT today.  - Fluticasone  Furoate (ARNUITY ELLIPTA ) 200 MCG/ACT AEPB; Inhale 1 puff into the lungs daily.  Dispense: 30 each; Refill: 12 - umeclidinium-vilanterol (ANORO ELLIPTA ) 62.5-25 MCG/ACT AEPB; Inhale 1 puff into the lungs daily.  Dispense: 60 each; Refill: 12 - PFT's reordered today. - Schedule LDCT   #Tobacco use disorder  Tobacco use disorder with current smoking reduced to ten cigarettes per day. She uses over the counter nicotine  pouches to manage cravings and is interested in quitting smoking. She is open to using prescribed nicotine  lozenges to aid cessation.  - nicotine  polacrilex (NICOTINE  MINI) 2 MG lozenge; Take 1 lozenge (2 mg total) by mouth every 2 (two) hours as needed for smoking cessation.  Dispense: 72 lozenge; Refill: 3   Return in about 6 months (around 06/05/2024).  Belva November, MD [facility]  I spent 30 minutes caring for this patient today, including preparing to see the patient, obtaining a medical history , reviewing a separately obtained history, performing a medically appropriate examination and/or evaluation, counseling and educating the patient/family/caregiver, ordering medications, tests, or procedures, documenting clinical information in the electronic  health record, and independently interpreting results (not separately reported/billed) and communicating results to the patient/family/caregiver  End of visit medications:  Meds ordered this encounter  Medications   Fluticasone  Furoate (ARNUITY ELLIPTA ) 200 MCG/ACT AEPB    Sig: Inhale 1 puff into the lungs daily.    Dispense:  30 each    Refill:  12   umeclidinium-vilanterol (ANORO ELLIPTA ) 62.5-25 MCG/ACT AEPB    Sig: Inhale 1 puff into the lungs daily.    Dispense:  60 each    Refill:  12   nicotine  polacrilex (NICOTINE  MINI) 2 MG lozenge    Sig: Take 1 lozenge (2 mg total) by mouth every 2 (two) hours as needed for smoking cessation.    Dispense:  72 lozenge    Refill:  3     Current Outpatient Medications:    albuterol  (PROVENTIL  HFA) 108 (90 Base) MCG/ACT inhaler, Inhale 2 puffs into the lungs every 4-6 hours as needed., Disp: 20.1 g, Rfl: 1   amphetamine-dextroamphetamine (ADDERALL XR) 10 MG 24 hr capsule, Take 10 mg by mouth daily., Disp: , Rfl:    citalopram  (CELEXA ) 40 MG tablet, Take 1 tablet (40 mg total) by mouth daily., Disp: 30 tablet, Rfl: 1   hydrOXYzine  (ATARAX ) 50 MG tablet, Take 1-1.5 tablets (50-75 mg total) by mouth at bedtime as needed for sleep, Disp: 45 tablet, Rfl: 1   ipratropium-albuterol  (DUONEB) 0.5-2.5 (3) MG/3ML SOLN, INHALE CONTENTS OF ONE VIAL (3ML TOTAL) USING NEBULIZER  ONCE EVERY 6 HOURS., Disp: 360 mL, Rfl: 2   lamoTRIgine  (LAMICTAL ) 100 MG tablet, Take 1 tablet (100 mg total) by mouth 2 (two) times daily., Disp: 180 tablet, Rfl: 4   lamoTRIgine  (LAMICTAL ) 100 MG tablet, Take 1 tablet (100 mg total)  by mouth daily., Disp: 30 tablet, Rfl: 1   meloxicam  (MOBIC ) 15 MG tablet, Take 15 mg by mouth daily., Disp: , Rfl:    metFORMIN  (GLUCOPHAGE ) 500 MG tablet, TAKE ONE TABLET BY MOUTH TWICE DAILY WITH MEALS., Disp: 120 tablet, Rfl: 1   methocarbamol  (ROBAXIN ) 500 MG tablet, Take 500 mg by mouth at bedtime., Disp: , Rfl:    rosuvastatin  (CRESTOR ) 10 MG  tablet, Take 10 mg by mouth at bedtime., Disp: , Rfl:    traMADol (ULTRAM) 50 MG tablet, Take 1 tablet by mouth every 6 (six) hours as needed., Disp: , Rfl:    triamcinolone  cream (KENALOG ) 0.1 %, Apply 1 Application topically 2 (two) times daily., Disp: 45 g, Rfl: 0   Fluticasone  Furoate (ARNUITY ELLIPTA ) 200 MCG/ACT AEPB, Inhale 1 puff into the lungs daily., Disp: 30 each, Rfl: 12   nicotine  polacrilex (NICOTINE  MINI) 2 MG lozenge, Take 1 lozenge (2 mg total) by mouth every 2 (two) hours as needed for smoking cessation., Disp: 72 lozenge, Rfl: 3   umeclidinium-vilanterol (ANORO ELLIPTA ) 62.5-25 MCG/ACT AEPB, Inhale 1 puff into the lungs daily., Disp: 60 each, Rfl: 12   Subjective:   PATIENT ID: Erica Gomez GENDER: female DOB: [birth year], MRN: [id number]  Chief Complaint  Patient presents with   Emphysema    DOE. Wheezing and cough in the morning. Anoro- helps with her breathing. Duoneb- Bid. Albuterol - PRN. Has been off Arnuity for a month.     HPI  Discussed the use of AI scribe software for clinical note transcription with the patient, who gave verbal consent to proceed.  Erica Gomez is a 58 year old female with asthma and COPD overlap who presents for follow-up of her respiratory symptoms.  Return Visit 05/02/2023:  She continues to have symptoms, but was unable to pick up her Trelegy. PCP sent prescriptions for advair  and spiriva  handihaler that she's used occasionally, with improvement in symptoms. She has an occasional cough but no wheeze. Continues to have some exertional dyspnea. She continues to have shortness of breath with exertion. She continues to smoke a few cigarettes a day.   She reports being told she had asthma in her 72's. Denies any history of asthma growing up, denies recurrent chest infections, denies recurrent episodes of bronchitis, and denies recurrent asthma exacerbations. Patient reports underlying psychiatric disorder and was obsessively watching videos  of pimple popping during our visit.  Return Visit 12/06/2023:  Her breathing has been poor over the past month due to a lapse in her medication regimen. She was previously on Arnuity and Anoro, which significantly improved her symptoms, allowing her to work without breathing difficulties. However, her insurance temporarily denied Arnuity, leading to a recurrence of shortness of breath and coughing. She experiences wheezing at times.  She continues to smoke, though she has reduced her intake to ten cigarettes per day from more than a pack. She attributes this reduction to using nicotine  lozenges, which help curb her cravings.      She works for [employer]. She doesn't have occupational exposures. She has a heavy history of smoking, with at least 40 pack years of smoking history. She is currently smoking 1/2 a pack a day.  Ancillary information including prior medications, full medical/surgical/family/social histories, and PFTs (when available) are listed below and have been reviewed.    Review of Systems  Constitutional:  Negative for chills, fever, malaise/fatigue and weight loss.  Respiratory:  Positive for cough, shortness of breath and wheezing. Negative for  hemoptysis and sputum production.   Cardiovascular:  Negative for chest pain.     Objective:   Vitals:   12/06/23 1505  BP: 118/70  Pulse: (!) 101  Temp: 97.8 F (36.6 C)  SpO2: 96%  Weight: 169 lb (76.7 kg)  Height: 5' 6 (1.676 m)   96% on RA  BMI Readings from Last 3 Encounters:  12/06/23 27.28 kg/m  08/06/23 30.02 kg/m  05/10/23 25.66 kg/m   Wt Readings from Last 3 Encounters:  12/06/23 169 lb (76.7 kg)  08/06/23 186 lb (84.4 kg)  05/10/23 159 lb (72.1 kg)    Physical Exam Constitutional:      Appearance: Normal appearance.  Cardiovascular:     Rate and Rhythm: Normal rate and regular rhythm.     Pulses: Normal pulses.     Heart sounds: Normal heart sounds.  Pulmonary:     Effort: Pulmonary  effort is normal.     Breath sounds: Normal breath sounds. No wheezing, rhonchi or rales.  Neurological:     General: No focal deficit present.     Mental Status: She is alert and oriented to person, place, and time. Mental status is at baseline.       Ancillary Information    Past Medical History:  Diagnosis Date   Allergy    Anxiety    Asthma    Bilateral carpal tunnel syndrome 12/31/2018   Bipolar affective, manic (HCC) 1996   COPD (chronic obstructive pulmonary disease) (HCC)    Depression    HLD (hyperlipidemia) 03/12/2018   Lung nodule, multiple 09/26/2016   Manic depressive illness (HCC) 1996   Seizures (HCC)    Substance abuse (HCC)    Tobacco abuse 10/04/2016     Family History  Problem Relation Age of Onset   Stroke Mother    Other Mother        blood clot   Heart attack Father    CAD Father    Bipolar disorder Brother    Schizophrenia Brother    Schizophrenia Brother    Bipolar disorder Brother      Past Surgical History:  Procedure Laterality Date   ABDOMINAL HYSTERECTOMY     CARPAL TUNNEL RELEASE Right 01/15/2019   Procedure: RIGHTCARPAL TUNNEL RELEASE, LEFT DEQUERVAINS INJECTION;  Surgeon: Jerri Kay HERO, MD;  Location: [facility];  Service: Orthopedics;  Laterality: Right;   CARPAL TUNNEL RELEASE Left 02/26/2019   Procedure: LEFT CARPAL TUNNEL RELEASE;  Surgeon: Jerri Kay HERO, MD;  Location: [facility];  Service: Orthopedics;  Laterality: Left;  Bier block   COLONOSCOPY WITH PROPOFOL  N/A 11/28/2018   Procedure: COLONOSCOPY WITH PROPOFOL ;  Surgeon: Therisa Bi, MD;  Location: [facility];  Service: Gastroenterology;  Laterality: N/A;   TUBAL LIGATION      Social History   Socioeconomic History   Marital status: Married    Spouse name: Not on file   Number of children: 2   Years of education: taking some medical billing classes now   Highest education level: Not on file  Occupational History   Occupation:  Caretaker  Tobacco Use   Smoking status: Every Day    Current packs/day: 0.50    Types: Cigarettes   Smokeless tobacco: Current    Types: Chew, Snuff    Last attempt to quit: 2018   Tobacco comments:    Smokes 0.5 PPD- khj 12/06/2023        Using nicotine  pouch to try and help her quit smoking.  Vaping Use   Vaping status: Former   Quit date: 08/11/2016  Substance and Sexual Activity   Alcohol use: Not Currently    Comment: rare   Drug use: No    Types: Crack cocaine    Comment: history 2013   Sexual activity: Yes    Birth control/protection: Condom, None  Other Topics Concern   Not on file  Social History Narrative   Lives with her husband.   Right-handed.   Caffeine use: 2 cups daily.   Social Drivers of Corporate investment banker Strain: Not on file  Food Insecurity: No Food Insecurity (09/21/2021)   Hunger Vital Sign    Worried About Running Out of Food in the Last Year: Never true    Ran Out of Food in the Last Year: Never true  Transportation Needs: No Transportation Needs (09/21/2021)   PRAPARE - Administrator, Civil Service (Medical): No    Lack of Transportation (Non-Medical): No  Physical Activity: Not on file  Stress: Not on file  Social Connections: Not on file  Intimate Partner Violence: Not on file     Allergies  Allergen Reactions   Flax Seed Oil [Flax Seed Oil] Hives    Pt. Self reported   Other Hives    Pt. Self reported   Trazodone Other (See Comments)    Bed wetting  Other Reaction(s): Other (See Comments)    Bed wetting     CBC    Component Value Date/Time   WBC 7.7 12/06/2022 1022   WBC 9.8 12/07/2019 2212   RBC 4.54 12/06/2022 1022   RBC 4.33 12/07/2019 2212   HGB 13.4 12/06/2022 1022   HCT 41.6 12/06/2022 1022   PLT 315 12/06/2022 1022   MCV 92 12/06/2022 1022   MCV 90 11/12/2013 1733   MCH 29.5 12/06/2022 1022   MCH 29.6 12/07/2019 2212   MCHC 32.2 12/06/2022 1022   MCHC 31.7 12/07/2019 2212   RDW 12.2  12/06/2022 1022   RDW 13.4 11/12/2013 1733   LYMPHSABS 2.4 12/07/2019 2212   LYMPHSABS 3.3 (H) 11/13/2018 1028   MONOABS 0.7 12/07/2019 2212   EOSABS 0.2 12/07/2019 2212   EOSABS 0.3 11/13/2018 1028   BASOSABS 0.0 12/07/2019 2212   BASOSABS 0.0 11/13/2018 1028    Pulmonary Functions Testing Results:     No data to display          Outpatient Medications Prior to Visit  Medication Sig Dispense Refill   albuterol  (PROVENTIL  HFA) 108 (90 Base) MCG/ACT inhaler Inhale 2 puffs into the lungs every 4-6 hours as needed. 20.1 g 1   amphetamine-dextroamphetamine (ADDERALL XR) 10 MG 24 hr capsule Take 10 mg by mouth daily.     citalopram  (CELEXA ) 40 MG tablet Take 1 tablet (40 mg total) by mouth daily. 30 tablet 1   hydrOXYzine  (ATARAX ) 50 MG tablet Take 1-1.5 tablets (50-75 mg total) by mouth at bedtime as needed for sleep 45 tablet 1   ipratropium-albuterol  (DUONEB) 0.5-2.5 (3) MG/3ML SOLN INHALE CONTENTS OF ONE VIAL ( TOTAL) USING NEBULIZER  ONCE EVERY 6 HOURS. 360 mL 2   lamoTRIgine  (LAMICTAL ) 100 MG tablet Take 1 tablet (100 mg total) by mouth 2 (two) times daily. 180 tablet 4   lamoTRIgine  (LAMICTAL ) 100 MG tablet Take 1 tablet (100 mg total) by mouth daily. 30 tablet 1   meloxicam  (MOBIC ) 15 MG tablet Take 15 mg by mouth daily.     metFORMIN  (GLUCOPHAGE ) 500 MG tablet TAKE  ONE TABLET BY MOUTH TWICE DAILY WITH MEALS. 120 tablet 1   methocarbamol  (ROBAXIN ) 500 MG tablet Take 500 mg by mouth at bedtime.     rosuvastatin  (CRESTOR ) 10 MG tablet Take 10 mg by mouth at bedtime.     traMADol (ULTRAM) 50 MG tablet Take 1 tablet by mouth every 6 (six) hours as needed.     triamcinolone  cream (KENALOG ) 0.1 % Apply 1 Application topically 2 (two) times daily. 45 g 0   Fluticasone  Furoate (ARNUITY ELLIPTA ) 200 MCG/ACT AEPB Inhale 1 puff into the lungs daily. 30 each 12   umeclidinium-vilanterol (ANORO ELLIPTA ) 62.5-25 MCG/ACT AEPB Inhale 1 puff into the lungs daily. 60 each 12   No  facility-administered medications prior to visit.

## 2023-12-06 NOTE — Patient Instructions (Addendum)
 VISIT SUMMARY: Today, you came in for a follow-up visit to discuss your respiratory symptoms related to asthma and COPD overlap. We reviewed your recent difficulties with breathing, which were due to a lapse in your medication regimen. We also discussed your smoking habits and your efforts to reduce smoking.  YOUR PLAN: -ASTHMA-COPD OVERLAP SYNDROME: Asthma-COPD overlap syndrome is a condition where you have symptoms of both asthma and chronic obstructive pulmonary disease (COPD). Your symptoms worsened recently because you had to stop taking Arnuity due to insurance issues. We have now prescribed Arnuity and Anoro Ellipta  again, and you should see an improvement in your symptoms. Please continue using your inhalers and remember to wash your mouth after each use.  -TOBACCO USE DISORDER: Tobacco use disorder means you have a dependence on smoking. You have successfully reduced your smoking to ten cigarettes per day with the help of nicotine  lozenges. We have prescribed nicotine  lozenges to help you further in your efforts to quit smoking.  INSTRUCTIONS: Please pick up your prescriptions for Arnuity, Anoro Ellipta , and nicotine  lozenges from the [facility] pharmacy on [address]. Continue using your inhalers as directed and wash your mouth after each use. If you have any questions or if your symptoms do not improve, please schedule a follow-up appointment.          The [place]  Quitline: Call 1-800-QUIT-NOW (4174900164). The [place] Quitline is a free service for [place]  residents. Trained counselors are available from 8 am until 3 am, 365 days per year. Services are available in both [place] and [place].   Web Resources Free online support programs can help you track your progress and share experiences with others who are quitting. These are examples: www.becomeanex.org www.trytostop.org  www.smokefree.gov  www.https://www.vargas.com/.aspx  [facility]  [facility]: offers comprehensive in-person tobacco treatment counseling at [facility] ([address], [place] [address]).  Open to everyone. Virtual appointments available. Free parking. Call [phone] to schedule an appointment or [phone] for general information.    Tobacco Cessation Medications  Nicotine  Replacement Therapy (NRT)  Nicotine  is the addictive part of tobacco smoke, but not the most dangerous part. There are 7000 other toxins in cigarettes, including carbon monoxide, that cause disease. People do not generally become addicted to medication. Common problems: People don't use enough medication or stop too early. Medications are safe and effective. Overdose is very uncommon. Use medications as long as needed (3 months minimum). Some combinations work better than single medications. Long acting medications like the NRT patch and bupropion provide continuous treatment for withdrawal symptoms.  PLUS  Short acting medications like the NRT gum, lozenge, inhaler, and nasal spray help people to cope with breakthrough cravings.  ? Nicotine  Patch  Place patch on hairless skin on upper body, including arms and back. Each day: discard old patch, shower, apply new patch to a different site. Apply hydrocortisone cream to mildly red/irritated areas. Call provider if rash develops. If patch causes sleep disturbance, remove patch at bedtime and replace each morning after shower. Side effects may include: skin irritation, headache, insomnia, abnormal/vivid dreams.  ? Nicotine  Gum  Chew gum slowly, park in cheek when peppery taste or tingling sensation begins (about 15-30 chews). When taste or tingling goes away, begin chewing again. Use until nicotine  is gone (taste or tingle does not return, usually 30 minutes). Park in different areas of mouth. Nicotine  is absorbed through the lining of the mouth. Use enough to control cravings, up to 24 pieces per  day (if used alone). Avoid eating or drinking for 15 minutes before using and during use. Side effects may include: mouth/jaw soreness, hiccups, indigestion, hypersalivation.  If gum is not chewed correctly, additional side effects may include lightheadedness, nausea/vomiting, throat and mouth irritation.  ? Nicotine  Lozenge  Allow to dissolve slowly in mouth (20-30 minutes). Do not chew or swallow. Nicotine  release may cause a warm tingling sensation. Occasionally rotate to different areas of the mouth. Use enough to control cravings, up to 20 lozenges per day (if used alone). Avoid eating or drinking for 15 minutes before using and during use. Side effects may include: nausea, hiccups, cough, heartburn, headache, gas, insomnia.  ? Nicotine  Nasal Spray Use 1 spray in each nostril (1 dose) and tilt head back for 1 minute. Do not sniff, swallow, or inhale through nose.  Use at least 8 doses (1 spray in each nostril) , up to 40 doses per day (if used alone). To reduce nasal irritation, spray on cotton swab and insert into nose. Side effects may include: nasal and/or throat irritation (hot, peppery, or burning sensation), nasal irritation, tearing, sneezing, cough, headache.  ? Nicotine  Oral Inhaler (puffer) Inhale into the back of the throat or puff in short breaths. Do not inhale into the lungs.  Puff continuously for 20 minutes (about 80 puffs) until cartridge is empty. Change cartridge when it loses the "burning in throat" sensation (feels like air only). Open cartridges can be saved and used again within 24 hours. Use at least 6 and up to 16 cartridges per day (if used alone).  Avoid eating or drinking for 15 minutes before using and during use. Side effects may include: mouth and/or throat irritation, unpleasant taste, cough, nasal irritation, indigestion, hiccups, headache.  ? Chantix  (varenicline ) Days 1-3: Take one 0.5 mg white pill each morning for 3 days, one week before quit  date. Days 4-7: Increase to one 0.5 mg white pill twice a day in morning and evening for 4 days.  On Day 8 (target quit date), increase to one 1 mg blue pill twice a day. Maintain this dose for a minimum of 3 months. Take with food and a full glass of water to reduce nausea. Be sure that the two doses are at least 8 hours apart, but try to take second dose early in the evening (i.e. 6 pm) to avoid sleep problems. Common side effects include: nausea, insomnia, headache, abnormal/vivid dreams. Tell your doctor if you have any history of psychiatric illness prior to starting Chantix .  STOP taking CHANTIX  and contact a healthcare provider immediately if you experience agitation, hostility, depressed mood, changes in thoughts or behavior that are not typical for you, thinking about or attempting suicide, allergic or skin reactions including swelling, rash, redness, or peeling of the skin.  For patients who have heart disease: Smoking is a major risk factor for cardiovascular disease, and Chantix  can help you quit smoking. Chantix  may be associated with a small, increased risk of certain heart events in patients who have heart disease. If you have any new or worsening symptoms of heart disease while taking Chantix , such as shortness of breath or trouble breathing, new or worsening chest pain, or new or worsening pain in your legs when walking, call your doctor or get emergency medical help immediately.  ? Wellbutrin / Zyban (bupropion) Take one 150 mg pill each morning for 3 days, one week before target quit date. On Day 4, increase to one 150 mg pill twice a day,  morning and evening.  Maintain this dose for a minimum of 3 months. Be sure that the two doses are at least 8 hours apart, but try to take second dose early in the evening (i.e. 6 pm) to avoid sleep problems. Avoid or minimize use of alcohol when taking this medication. Common side effects include: dry mouth, headache, insomnia, nausea, weight  loss.  Risk of seizure is 02/998. STOP taking BUPROPION and contact a healthcare provider immediately if you experience agitation, hostility, depressed mood, changes in thoughts or behavior that are not typical for you, thinking about or attempting suicide, allergic or skin reactions including swelling, rash, redness, or peeling of the skin.

## 2023-12-12 ENCOUNTER — Ambulatory Visit

## 2024-02-29 ENCOUNTER — Inpatient Hospital Stay: Payer: MEDICAID

## 2024-02-29 ENCOUNTER — Emergency Department: Payer: MEDICAID

## 2024-02-29 ENCOUNTER — Inpatient Hospital Stay
Admission: EM | Admit: 2024-02-29 | Payer: MEDICAID | Source: Home / Self Care | Attending: Student in an Organized Health Care Education/Training Program | Admitting: Student in an Organized Health Care Education/Training Program

## 2024-02-29 ENCOUNTER — Other Ambulatory Visit: Payer: Self-pay

## 2024-02-29 DIAGNOSIS — J4552 Severe persistent asthma with status asthmaticus: Secondary | ICD-10-CM

## 2024-02-29 DIAGNOSIS — J9621 Acute and chronic respiratory failure with hypoxia: Secondary | ICD-10-CM | POA: Diagnosis present

## 2024-02-29 DIAGNOSIS — J9601 Acute respiratory failure with hypoxia: Secondary | ICD-10-CM

## 2024-02-29 DIAGNOSIS — J441 Chronic obstructive pulmonary disease with (acute) exacerbation: Principal | ICD-10-CM

## 2024-02-29 LAB — URINALYSIS, ROUTINE W REFLEX MICROSCOPIC
Bilirubin Urine: NEGATIVE
Glucose, UA: NEGATIVE mg/dL
Hgb urine dipstick: NEGATIVE
Ketones, ur: NEGATIVE mg/dL
Leukocytes,Ua: NEGATIVE
Nitrite: NEGATIVE
Protein, ur: 30 mg/dL — AB
Specific Gravity, Urine: 1.019 (ref 1.005–1.030)
pH: 5 (ref 5.0–8.0)

## 2024-02-29 LAB — COMPREHENSIVE METABOLIC PANEL WITH GFR
ALT: 20 U/L (ref 0–44)
AST: 23 U/L (ref 15–41)
Albumin: 4.6 g/dL (ref 3.5–5.0)
Alkaline Phosphatase: 90 U/L (ref 38–126)
Anion gap: 10 (ref 5–15)
BUN: 14 mg/dL (ref 6–20)
CO2: 25 mmol/L (ref 22–32)
Calcium: 8.9 mg/dL (ref 8.9–10.3)
Chloride: 106 mmol/L (ref 98–111)
Creatinine, Ser: 0.81 mg/dL (ref 0.44–1.00)
GFR, Estimated: >60 mL/min
Glucose, Bld: 147 mg/dL — ABNORMAL HIGH (ref 70–99)
Potassium: 4.9 mmol/L (ref 3.5–5.1)
Sodium: 141 mmol/L (ref 135–145)
Total Bilirubin: <0.2 mg/dL (ref 0.0–1.2)
Total Protein: 7.1 g/dL (ref 6.5–8.1)

## 2024-02-29 LAB — PROCALCITONIN: Procalcitonin: <0.1 ng/mL

## 2024-02-29 LAB — CBC WITH DIFFERENTIAL/PLATELET
Abs Immature Granulocytes: 0.02 K/uL (ref 0.00–0.07)
Basophils Absolute: 0 K/uL (ref 0.0–0.1)
Basophils Relative: 0 %
Eosinophils Absolute: 0.2 K/uL (ref 0.0–0.5)
Eosinophils Relative: 2 %
HCT: 40.9 % (ref 36.0–46.0)
Hemoglobin: 12.7 g/dL (ref 12.0–15.0)
Immature Granulocytes: 0 %
Lymphocytes Relative: 41 %
Lymphs Abs: 4.4 K/uL — ABNORMAL HIGH (ref 0.7–4.0)
MCH: 29.5 pg (ref 26.0–34.0)
MCHC: 31.1 g/dL (ref 30.0–36.0)
MCV: 94.9 fL (ref 80.0–100.0)
Monocytes Absolute: 0.9 K/uL (ref 0.1–1.0)
Monocytes Relative: 8 %
Neutro Abs: 5.2 K/uL (ref 1.7–7.7)
Neutrophils Relative %: 49 %
Platelets: 313 K/uL (ref 150–400)
RBC: 4.31 MIL/uL (ref 3.87–5.11)
RDW: 13.3 % (ref 11.5–15.5)
WBC: 10.7 K/uL — ABNORMAL HIGH (ref 4.0–10.5)
nRBC: 0 % (ref 0.0–0.2)

## 2024-02-29 LAB — RESP PANEL BY RT-PCR (RSV, FLU A&B, COVID)  RVPGX2
Influenza A by PCR: NEGATIVE
Influenza B by PCR: NEGATIVE
Resp Syncytial Virus by PCR: NEGATIVE
SARS Coronavirus 2 by RT PCR: NEGATIVE

## 2024-02-29 LAB — BLOOD GAS, VENOUS
Acid-base deficit: 0.4 mmol/L (ref 0.0–2.0)
Bicarbonate: 29.8 mmol/L — ABNORMAL HIGH (ref 20.0–28.0)
O2 Saturation: 98.3 %
Patient temperature: 37
pCO2, Ven: 78 mmHg (ref 44–60)
pH, Ven: 7.19 — CL (ref 7.25–7.43)
pO2, Ven: 155 mmHg — ABNORMAL HIGH (ref 32–45)

## 2024-02-29 LAB — TROPONIN T, HIGH SENSITIVITY
Troponin T High Sensitivity: <15 ng/L (ref 0–19)
Troponin T High Sensitivity: <15 ng/L (ref 0–19)

## 2024-02-29 LAB — URINE DRUG SCREEN
Amphetamines: POSITIVE — AB
Barbiturates: NEGATIVE
Benzodiazepines: POSITIVE — AB
Cocaine: NEGATIVE
Fentanyl: NEGATIVE
Methadone Scn, Ur: NEGATIVE
Opiates: NEGATIVE
Tetrahydrocannabinol: NEGATIVE

## 2024-02-29 LAB — PRO BRAIN NATRIURETIC PEPTIDE: Pro Brain Natriuretic Peptide: <50 pg/mL

## 2024-02-29 LAB — MRSA NEXT GEN BY PCR, NASAL: MRSA by PCR Next Gen: NOT DETECTED

## 2024-02-29 LAB — LACTIC ACID, PLASMA
Lactic Acid, Venous: 1.7 mmol/L (ref 0.5–1.9)
Lactic Acid, Venous: 1.7 mmol/L (ref 0.5–1.9)

## 2024-02-29 LAB — GLUCOSE, CAPILLARY: Glucose-Capillary: 177 mg/dL — ABNORMAL HIGH (ref 70–99)

## 2024-02-29 MED ORDER — FENTANYL BOLUS VIA INFUSION
25.0000 ug | INTRAVENOUS | Status: DC | PRN
  Administered 2024-02-29 – 2024-03-01 (×6): 50 ug via INTRAVENOUS
  Administered 2024-03-02 – 2024-03-03 (×2): 100 ug via INTRAVENOUS
  Administered 2024-03-03: 25 ug via INTRAVENOUS
  Administered 2024-03-03 (×2): 50 ug via INTRAVENOUS
  Administered 2024-03-03: 25 ug via INTRAVENOUS
  Administered 2024-03-04 (×4): 50 ug via INTRAVENOUS
  Administered 2024-03-04: 100 ug via INTRAVENOUS
  Administered 2024-03-05 (×4): 50 ug via INTRAVENOUS
  Administered 2024-03-05 – 2024-03-07 (×9): 100 ug via INTRAVENOUS

## 2024-02-29 MED ORDER — ROCURONIUM BROMIDE 10 MG/ML (PF) SYRINGE
PREFILLED_SYRINGE | INTRAVENOUS | Status: AC
  Administered 2024-02-29: 70 mg
  Filled 2024-02-29: qty 10

## 2024-02-29 MED ORDER — IPRATROPIUM-ALBUTEROL 0.5-2.5 (3) MG/3ML IN SOLN
3.0000 mL | Freq: Four times a day (QID) | RESPIRATORY_TRACT | Status: DC | PRN
  Administered 2024-02-29: 3 mL via RESPIRATORY_TRACT

## 2024-02-29 MED ORDER — PROPOFOL 1000 MG/100ML IV EMUL
0.0000 ug/kg/min | INTRAVENOUS | Status: DC
  Administered 2024-02-29: 5 ug/kg/min via INTRAVENOUS
  Administered 2024-03-01 (×3): 50 ug/kg/min via INTRAVENOUS
  Filled 2024-02-29 (×3): qty 100

## 2024-02-29 MED ORDER — KETAMINE HCL 10 MG/ML IJ SOLN
INTRAMUSCULAR | Status: AC
  Administered 2024-02-29: 70 mg
  Filled 2024-02-29: qty 1

## 2024-02-29 MED ORDER — ORAL CARE MOUTH RINSE: 15.0000 mL | OROMUCOSAL | Status: AC | PRN

## 2024-02-29 MED ORDER — POLYETHYLENE GLYCOL 3350 17 G PO PACK
17.0000 g | PACK | Freq: Every day | ORAL | Status: DC | PRN
  Administered 2024-03-02: 17 g via ORAL
  Filled 2024-02-29: qty 1

## 2024-02-29 MED ORDER — SENNA 8.6 MG PO TABS
1.0000 | ORAL_TABLET | Freq: Two times a day (BID) | ORAL | Status: DC | PRN
  Administered 2024-03-02: 8.6 mg via ORAL
  Filled 2024-02-29: qty 1

## 2024-02-29 MED ORDER — ENOXAPARIN SODIUM 40 MG/0.4ML IJ SOSY
40.0000 mg | PREFILLED_SYRINGE | INTRAMUSCULAR | Status: DC
  Administered 2024-03-01: 40 mg via SUBCUTANEOUS
  Filled 2024-02-29: qty 0.4

## 2024-02-29 MED ORDER — PROPOFOL 1000 MG/100ML IV EMUL
INTRAVENOUS | Status: AC
  Filled 2024-02-29: qty 100

## 2024-02-29 MED ORDER — IPRATROPIUM-ALBUTEROL 0.5-2.5 (3) MG/3ML IN SOLN
6.0000 mL | Freq: Once | RESPIRATORY_TRACT | Status: AC
  Administered 2024-02-29: 6 mL via RESPIRATORY_TRACT
  Filled 2024-02-29: qty 6

## 2024-02-29 MED ORDER — MIDAZOLAM HCL (PF) 2 MG/2ML IJ SOLN
1.0000 mg | INTRAMUSCULAR | Status: DC | PRN
  Administered 2024-02-29 – 2024-03-07 (×15): 2 mg via INTRAVENOUS
  Filled 2024-02-29 (×17): qty 2

## 2024-02-29 MED ORDER — ORAL CARE MOUTH RINSE
15.0000 mL | OROMUCOSAL | Status: AC
  Administered 2024-03-01 – 2024-03-21 (×250): 15 mL via OROMUCOSAL

## 2024-02-29 MED ORDER — FENTANYL 2500MCG IN NS 250ML (10MCG/ML) PREMIX INFUSION
0.0000 ug/h | INTRAVENOUS | Status: DC
  Administered 2024-02-29: 25 ug/h via INTRAVENOUS
  Administered 2024-03-02 – 2024-03-03 (×2): 150 ug/h via INTRAVENOUS
  Administered 2024-03-04: 50 ug/h via INTRAVENOUS
  Administered 2024-03-05: 25 ug/h via INTRAVENOUS
  Administered 2024-03-06 (×2): 200 ug/h via INTRAVENOUS
  Administered 2024-03-07: 250 ug/h via INTRAVENOUS
  Filled 2024-02-29 (×9): qty 250

## 2024-02-29 MED ORDER — CHLORHEXIDINE GLUCONATE CLOTH 2 % EX PADS
6.0000 | MEDICATED_PAD | Freq: Every day | CUTANEOUS | Status: DC
  Administered 2024-02-29 – 2024-03-19 (×19): 6 via TOPICAL
  Filled 2024-02-29: qty 6

## 2024-02-29 MED ORDER — ENOXAPARIN SODIUM 30 MG/0.3ML IJ SOSY
30.0000 mg | PREFILLED_SYRINGE | INTRAMUSCULAR | Status: DC
  Administered 2024-02-29: 30 mg via SUBCUTANEOUS
  Filled 2024-02-29: qty 0.3

## 2024-02-29 MED ORDER — IPRATROPIUM-ALBUTEROL 0.5-2.5 (3) MG/3ML IN SOLN
3.0000 mL | Freq: Four times a day (QID) | RESPIRATORY_TRACT | Status: DC
  Administered 2024-02-29: 3 mL via RESPIRATORY_TRACT
  Filled 2024-02-29: qty 3

## 2024-02-29 MED ORDER — FENTANYL CITRATE (PF) 50 MCG/ML IJ SOSY
25.0000 ug | PREFILLED_SYRINGE | Freq: Once | INTRAMUSCULAR | Status: AC
  Administered 2024-02-29: 50 ug via INTRAVENOUS

## 2024-02-29 MED ORDER — IPRATROPIUM-ALBUTEROL 0.5-2.5 (3) MG/3ML IN SOLN
3.0000 mL | RESPIRATORY_TRACT | Status: DC
  Administered 2024-02-29 – 2024-03-06 (×33): 3 mL via RESPIRATORY_TRACT
  Filled 2024-02-29 (×31): qty 3

## 2024-02-29 MED ORDER — MAGNESIUM SULFATE 2 GM/50ML IV SOLN
2.0000 g | Freq: Once | INTRAVENOUS | Status: AC
  Administered 2024-02-29: 2 g via INTRAVENOUS
  Filled 2024-02-29: qty 50

## 2024-02-29 NOTE — ED Notes (Signed)
 Urine sent

## 2024-02-29 NOTE — Plan of Care (Signed)
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
## 2024-02-29 NOTE — ED Notes (Addendum)
 Phone and clothes ( pants and coat) placed in pt belongings bag at bedside. Phone placed in R shoe

## 2024-02-29 NOTE — ED Notes (Signed)
Critical Care NP at bedside 

## 2024-02-29 NOTE — ED Notes (Signed)
 Report given to ICU

## 2024-02-29 NOTE — ED Notes (Addendum)
70 roc in

## 2024-02-29 NOTE — Progress Notes (Signed)
 eLink Physician-Brief Progress Note Patient Name: Erica Gomez DOB: 1965/10/22 MRN: [id number]   Date of Service  02/29/2024  HPI/Events of Note  50F COPD/asthma, hx seizures, CAD and bipolar admitted for asthma exacerbation. EMS gave mag 2, solumedrol 125 and duoneb x 1. Agitated en route with EMS and given haldol 5 mg and versed  5 mg. Covid/RSV/influenza neg. On arrival somnolent and unresponsive. Intubated in ED. Admitted to ICU with PCCM. On minimal vent settings and propofol  and fentanyl . Labs reviewed. Bland CBC, CMET. Neg trop. BNP <50. VBG c/w acute hypercarbic respiratory failure. UDS + benzo and amphetamines (home adderall). CT head NAICA. CXR with ETT I place, no infiltrate or effusion    eICU Interventions  -Wean sedation for RASS -1, 0.  -Full vent support. Likely SBT in am. F/u post intubation ABG -Recommend scheduled nebs, steroids daily, macrolide daily -Full RVP pending -No AED on MAR. Keppra  last prescribed in 06/2020. Monitor for sz activity.      Intervention Category Evaluation Type: New Patient Evaluation  Daina Cara Slater Staff 02/29/2024, 11:22 PM

## 2024-02-29 NOTE — H&P (Signed)
 "   NAME:  Erica Gomez, MRN:  [id number], DOB:  08-29-1965, LOS: 0 ADMISSION DATE:  02/29/2024, CONSULTATION DATE:  02/29/24 REFERRING MD:  [name] CHIEF COMPLAINT: Acute Respiratory Distress   HPI  59 y/o F with PMH sig for asthma, COPD, tobacco use disorder, bipolar disorder, seizure, polysubstance abuse, anxiety/depression, and HLD presented to ED BIB [facility] from home for acute respiratory distress.  On Chart review, patient follows with pulmonologist; last visit with Dr. Isadora on 10/25. Meds prescribed at visit included fluticasone  furoate (Arnuity Ellipta ) 200 mcg 1 puff daily and umeclidinium-vilanterol (Anoro Ellipta ) 62.5/25 mcg 1 puff daily. Per husband, patient has been taking medications as prescribed. EMS tx included MgSO? 2 g IV, Solu-Medrol  125 mg IV, DuoNeb x1, and albuterol  x1. During transport, pt became combative/confused and received Haldol 4 mg IM and Versed  5 mg IM. Initially A&O with EMS. SpO? 94% on RA.  ED Course: Initial VS: BP 211/120, HR 142, SpO? 94% on RA. Patient was unresponsive with labored/heaving respirations and in severe resp distress. Concern for hypercapnic resp failure contributing to agitation and AMS. Pt subsequently lost ability to protect airway. Given worsening MS and resp failure, decision made for emergent intubation. Pt received ketamine  and rocuronium  and was intubated successfully without complication.  Labs/Imaging: Labs: WBC 10.7, Hgb 12.7, Hct 40.9, Plt 313. BMP: Na 141, K 4.9, Cl 106, CO? 25, BUN 14, Cr 0.81, Glu 147, Ca 8.9. LFTs WNL: AST 23, ALT 20, Alk Phos 90, Tbili <0.2, Alb 4.6. VBG c/w resp acidosis/hypercapnia (HCO? 29.8), O? sat 98.3. Lactate 1.7. CXR: no acute cardiopulm process; ETT in mid trachea, enteric tube in stomach. CT head ordered and pending for AMS.  PCCM consulted for admission.  Past Medical History  Asthma COPD Tobacco use d/o Bipolar Disorder Seizure Disorder Polysubstance  abuse Anxiety/depression HLD Significant Hospital Events   1/16: Admitted to ICU for asthma/COPD overlap exacerbation with hypercapnic respiratory failure requiring emergent intubation.  Consults:    Procedures:  1/16:Endotrachael Intubation  Interim History / Subjective:    -see significant events  Micro Data:  02/29/24 SARS-CoV-2 PCR ? 02/29/24 Flu PCR ? 02/29/24 RVP ? 02/29/24 BCx2 ? 02/29/24 UCx ? 02/29/24 MRSA PCR ? 02/29/24 Strep pneumo Ur Ag ? 02/29/24 Legionella Ur Ag ?  Antimicrobials:    OBJECTIVE  Blood pressure 128/89, pulse (!) 106, temperature 98.4 F (36.9 C), resp. rate (!) 25, height 5' 6 (1.676 m), weight 77.5 kg, SpO2 100%.    Vent Mode: PRVC FiO2 (%):  [35 %-40 %] 35 % Set Rate:  [22 bmp] 22 bmp Vt Set:  [450 mL] 450 mL PEEP:  [5 cmH20] 5 cmH20   Intake/Output Summary (Last 24 hours) at 02/29/2024 2303 Last data filed at 02/29/2024 2044 Gross per 24 hour  Intake 26.53 ml  Output --  Net 26.53 ml   Filed Weights   02/29/24 1824  Weight: 77.5 kg    Physical Examination  GEN: Critically ill patient, intubated and sedated HEENT: [place]/AT. PERRL, sclerae anicteric. HEART: regular rhythm, normal rate, S1, S2, no M/R/G,  LUNGS: CTAB, diminished bilateral without wheezes, no increased WOB,  EXTREMITIES: No Edema, cap refill  NEURO: No gross focal deficits. PSYCH:  UTA ABDOMINAL: Soft: BS x 4, NTND SKIN: Intact, warm, no rashes lesion, or ulcer  Active Hospital Problem list   See systems below  Assessment & Plan:  #Hypercapnic Respiratory Failure / Asthma-COPD Exacerbation Hx of Severe asthma/COPD overlap; hypercapnia, AMS; smoker on Arnuity, Anoro -PRVC vent:  FiO? 35%, Vt 450 mL, RR 22, PEEP 5; titrate per ABG -IV methylprednisolone  -Neb ipratropium-albuterol  q4h + PRN q6h -propofol  + fentanyl , RASS -1 -PRN ABG &Xray -Nicotine  replacement therapy  #AMS / Delirium Multifactorial (hypercapnia, hypoxia, psych dz, ICU stress); CT head  neg -Titrate sedation to RASS -1 -Daily neuro checks + delirium screen  #Seizure Disorder Hx seizures, no recent events -Continue lamotrigine  via tube -Seizure precautions -PRN benzo if breakthrough -Avoid tramadol/other seizure-lowering meds -Monitor metabolic/electrolyte derangements  #Infection / Sepsis Rule-Out WBC 10.7, lactate 1.7, CXR neg; cultures pending -Trend WBC, lactate, PCT; MRSA swab, RVP -Empiric ceftriaxone  -Gentle IVF PRN; maintain MAP >65  #Hypertension #Hyperlipidemia Chronic, poorly controlled; ED BP 211/120 ? 128/89 -Treat per ICU protocol if persistent -Resume home anti-HTN when stable -resume rosuvastatin  once enteral access  #Psych (Anxiety/Depression/Bipolar) -Resume home citalopram , Atarax , Adderall PO -Psychiatry consult if agitation persists post-extubation  #Prediabetes / Glycemic Control HbA1c 6.4-6.7%; CBG 147-177 -Check glucose q4-6h -SSI  -Goal 140-180 mg/dL -Hold metformin  while NPO -Early enteral nutrition when feasible  #Polysubstance / Tobacco Use Active smoker, past polysubstance abuse -UDS +Amphetamine /on Adderall at home -Nicotine  replacement therapy -Counseling/support post-extubation -Avoid sedatives that worsen withdrawal  Best practice:  Diet:  NPO Pain/Anxiety/Delirium protocol (if indicated): Yes (RASS goal -1) VAP protocol (if indicated): Yes DVT prophylaxis: LMWH GI prophylaxis: H2B Glucose control:  SSI No Central venous access:  N/A Arterial line:  N/A Foley:  N/A Mobility:  bed rest  PT/OT consulted: N/A Code Status:  full code Disposition: ICU   = Goals of Care =  Primary Emergency Contact: Piasecki,John   Labs/imaging that I havepersonally reviewed  (right click and Reselect all SmartList Selections daily)   CT Head Wo Contrast Result Date: 02/29/2024 CLINICAL DATA:  Mental status change EXAM: CT HEAD WITHOUT CONTRAST TECHNIQUE: Contiguous axial images were obtained from the base of the skull  through the vertex without intravenous contrast. RADIATION DOSE REDUCTION: This exam was performed according to the departmental dose-optimization program which includes automated exposure control, adjustment of the mA and/or kV according to patient size and/or use of iterative reconstruction technique. COMPARISON:  CT 12/07/2019, MRI 02/20/2020 FINDINGS: Brain: No acute territorial infarction, hemorrhage or intracranial mass. The ventricles are nonenlarged Vascular: No hyperdense vessels.  No unexpected calcification Skull: No fracture Sinuses/Orbits: No acute finding. Other: Incompletely visualized endotracheal and enteric tubes. IMPRESSION: Negative non contrasted CT appearance of the brain. Electronically Signed   By: Luke Bun M.D.   On: 02/29/2024 20:03   DG Abdomen 1 View Result Date: 02/29/2024 EXAM: 1 VIEW XRAY OF THE ABDOMEN 02/29/2024 06:59:11 PM COMPARISON: None available. CLINICAL HISTORY: POST INTUBATION FINDINGS: LINES, TUBES AND DEVICES: NG tube tip is in the stomach. BOWEL: Nonobstructive bowel gas pattern. SOFT TISSUES: No abnormal calcifications. BONES: No acute fracture. IMPRESSION: 1. Enteric tube tip terminates in the stomach. 2. Nonobstructive bowel gas pattern. Electronically signed by: Franky Crease MD 02/29/2024 07:23 PM EST RP Workstation: [id number]   DG Chest Portable 1 View Result Date: 02/29/2024 EXAM: 1 VIEW XRAY OF THE CHEST 02/29/2024 06:59:11 PM COMPARISON: None available. CLINICAL HISTORY: SOB SOB SOB FINDINGS: LINES, TUBES AND DEVICES: Endotracheal tube tip in the mid trachea. NG tube enters the stomach. LUNGS AND PLEURA: No confluent airspace opacities. No effusions. No pneumothorax. HEART AND MEDIASTINUM: No acute abnormality of the cardiac and mediastinal silhouettes. BONES AND SOFT TISSUES: No acute osseous abnormality. IMPRESSION: 1. No acute cardiopulmonary process. 2. Endotracheal tube tip in the mid trachea and enteric tube  courses into the stomach. Electronically  signed by: Franky Crease MD 02/29/2024 07:22 PM EST RP Workstation: [id number]     Labs   CBC: Recent Labs  Lab 02/29/24 1833  WBC 10.7*  NEUTROABS 5.2  HGB 12.7  HCT 40.9  MCV 94.9  PLT 313    Basic Metabolic Panel: Recent Labs  Lab 02/29/24 1833  NA 141  K 4.9  CL 106  CO2 25  GLUCOSE 147*  BUN 14  CREATININE 0.81  CALCIUM  8.9   GFR: Estimated Creatinine Clearance: 79.6 mL/min (by C-G formula based on SCr of 0.81 mg/dL). Recent Labs  Lab 02/29/24 1833 02/29/24 1834 02/29/24 2018  PROCALCITON  --   --  <0.10  WBC 10.7*  --   --   LATICACIDVEN  --  1.7 1.7    Liver Function Tests: Recent Labs  Lab 02/29/24 1833  AST 23  ALT 20  ALKPHOS 90  BILITOT <0.2  PROT 7.1  ALBUMIN 4.6   No results for input(s): LIPASE, AMYLASE in the last 168 hours. No results for input(s): AMMONIA in the last 168 hours.  ABG    Component Value Date/Time   HCO3 29.8 (H) 02/29/2024 1833   ACIDBASEDEF 0.4 02/29/2024 1833   O2SAT 98.3 02/29/2024 1833     Coagulation Profile: No results for input(s): INR, PROTIME in the last 168 hours.  Cardiac Enzymes: No results for input(s): CKTOTAL, CKMB, CKMBINDEX, TROPONINI in the last 168 hours.  HbA1C: Hemoglobin A1C  Date/Time Value Ref Range Status  09/21/2021 02:00 PM 6.4 (A) 4.0 - 5.6 % Final  03/22/2021 09:52 AM 6.2 (A) 4.0 - 5.6 % Final  01/15/2014 12:00 AM 6.8  Final   HbA1c, POC (prediabetic range)  Date/Time Value Ref Range Status  05/11/2020 10:30 AM 0 (A) 5.7 - 6.4 % Final   HbA1c, POC (controlled diabetic range)  Date/Time Value Ref Range Status  05/11/2020 10:30 AM 0.0 0.0 - 7.0 % Final   HbA1c POC (<> result, manual entry)  Date/Time Value Ref Range Status  05/11/2020 10:30 AM 6.5 4.0 - 5.6 % Final   Hgb A1c MFr Bld  Date/Time Value Ref Range Status  12/24/2019 11:08 AM 6.7 (H) 4.8 - 5.6 % Final    Comment:             Prediabetes: 5.7 - 6.4          Diabetes: >6.4          Glycemic  control for adults with diabetes: <7.0   07/02/2019 09:53 AM 6.4 (H) 4.8 - 5.6 % Final    Comment:             Prediabetes: 5.7 - 6.4          Diabetes: >6.4          Glycemic control for adults with diabetes: <7.0    CBG: Recent Labs  Lab 02/29/24 2300  GLUCAP 177*    Review of Systems:   Unable to be obtained secondary to the patient's intubated and sedated status.   Past Medical History  She,  has a past medical history of Allergy, Anxiety, Asthma, Bilateral carpal tunnel syndrome (12/31/2018), Bipolar affective, manic (HCC) (1996), COPD (chronic obstructive pulmonary disease) (HCC), Depression, HLD (hyperlipidemia) (03/12/2018), Lung nodule, multiple (09/26/2016), Manic depressive illness (HCC) (1996), Seizures (HCC), Substance abuse (HCC), and Tobacco abuse (10/04/2016).   Surgical History    Past Surgical History:  Procedure Laterality Date   ABDOMINAL HYSTERECTOMY  CARPAL TUNNEL RELEASE Right 01/15/2019   Procedure: RIGHTCARPAL TUNNEL RELEASE, LEFT DEQUERVAINS INJECTION;  Surgeon: Jerri Kay HERO, MD;  Location: [facility];  Service: Orthopedics;  Laterality: Right;   CARPAL TUNNEL RELEASE Left 02/26/2019   Procedure: LEFT CARPAL TUNNEL RELEASE;  Surgeon: Jerri Kay HERO, MD;  Location: [facility];  Service: Orthopedics;  Laterality: Left;  Bier block   COLONOSCOPY WITH PROPOFOL  N/A 11/28/2018   Procedure: COLONOSCOPY WITH PROPOFOL ;  Surgeon: Therisa Bi, MD;  [facility];  Service: Gastroenterology;  Laterality: N/A;   TUBAL LIGATION       Social History   reports that she has been smoking cigarettes. Her smokeless tobacco use includes chew and snuff. She reports that she does not currently use alcohol. She reports that she does not use drugs.   Family History   Her family history includes Bipolar disorder in her brother and brother; CAD in her father; Heart attack in her father; Other in her mother; Schizophrenia in her brother and  brother; Stroke in her mother.   Allergies Allergies[1]   Home Medications  Prior to Admission medications  Medication Sig Start Date End Date Taking? Authorizing Provider  albuterol  (PROVENTIL  HFA) 108 (90 Base) MCG/ACT inhaler Inhale 2 puffs into the lungs every 4-6 hours as needed. 09/21/21   Iloabachie, Chioma E, NP  amphetamine-dextroamphetamine (ADDERALL XR) 10 MG 24 hr capsule Take 10 mg by mouth daily.    [provider]  citalopram  (CELEXA ) 40 MG tablet Take 1 tablet (40 mg total) by mouth daily. 11/21/22     Fluticasone  Furoate (ARNUITY ELLIPTA ) 200 MCG/ACT AEPB Inhale 1 puff into the lungs daily. 12/06/23   Isadora Hose, MD  hydrOXYzine  (ATARAX ) 50 MG tablet Take 1-1.5 tablets (50-75 mg total) by mouth at bedtime as needed for sleep 11/21/22     ipratropium-albuterol  (DUONEB) 0.5-2.5 (3) MG/3ML SOLN INHALE CONTENTS OF ONE VIAL ( TOTAL) USING NEBULIZER  ONCE EVERY 6 HOURS. 11/02/20 12/06/23  Iloabachie, Chioma E, NP  lamoTRIgine  (LAMICTAL ) 100 MG tablet Take 1 tablet (100 mg total) by mouth 2 (two) times daily. 06/23/20   Gayland Lauraine PARAS, NP  lamoTRIgine  (LAMICTAL ) 100 MG tablet Take 1 tablet (100 mg total) by mouth daily. 11/21/22     meloxicam  (MOBIC ) 15 MG tablet Take 15 mg by mouth daily.    [provider]  metFORMIN  (GLUCOPHAGE ) 500 MG tablet TAKE ONE TABLET BY MOUTH TWICE DAILY WITH MEALS. 09/21/21   Iloabachie, Chioma E, NP  methocarbamol  (ROBAXIN ) 500 MG tablet Take 500 mg by mouth at bedtime.    [provider]  nicotine  polacrilex (NICOTINE  MINI) 2 MG lozenge Take 1 lozenge (2 mg total) by mouth every 2 (two) hours as needed for smoking cessation. 12/06/23 03/05/24  Isadora Hose, MD  rosuvastatin  (CRESTOR ) 10 MG tablet Take 10 mg by mouth at bedtime. 08/13/23   [provider]  traMADol (ULTRAM) 50 MG tablet Take 1 tablet by mouth every 6 (six) hours as needed.    [provider]  triamcinolone  cream (KENALOG ) 0.1 % Apply 1  Application topically 2 (two) times daily. 09/21/21   Iloabachie, Chioma E, NP  umeclidinium-vilanterol (ANORO ELLIPTA ) 62.5-25 MCG/ACT AEPB Inhale 1 puff into the lungs daily. 12/06/23   Isadora Hose, MD  atomoxetine  (STRATTERA ) 25 MG capsule Take 1 capsule (25 mg total) by mouth once every evening after dinner. Patient not taking: Reported on 12/23/2020 10/26/20 03/09/21    loratadine  (CLARITIN ) 10 MG tablet Take 1  tablet (10 mg total) by mouth daily as needed for allergies. 12/16/19 12/25/19  Iloabachie, Chioma E, NP   Scheduled Meds:  Chlorhexidine  Gluconate Cloth  6 each Topical Daily   [START ON 03/01/2024] enoxaparin  (LOVENOX ) injection  40 mg Subcutaneous Q24H   [START ON 03/01/2024] ipratropium-albuterol   3 mL Nebulization Q4H   Continuous Infusions:  fentaNYL  infusion INTRAVENOUS 100 mcg/hr (02/29/24 2026)   propofol  (DIPRIVAN ) infusion 40 mcg/kg/min (02/29/24 2044)   PRN Meds:.fentaNYL , ipratropium-albuterol , midazolam  PF, polyethylene glycol, senna  Critical care time: 55 minutes        Almarie Nose DNP, CCRN, FNP-C, AGACNP-BC Acute Care & Family Nurse Practitioner [facility] PCCM on call pager [phone]       [1]  Allergies Allergen Reactions   Flax Seed Oil [Flax Seed Oil] Hives    Pt. Self reported   Other Hives    Pt. Self reported   Trazodone Other (See Comments)    Bed wetting  Other Reaction(s): Other (See Comments)    Bed wetting   "

## 2024-02-29 NOTE — ED Notes (Signed)
 70 ketamine  in

## 2024-02-29 NOTE — ED Triage Notes (Signed)
 Pt BIB [facility] from home due to a asthma attack. Pt has hx of same. Pt was given 2 mag, 125 solumedrol, 1 duo and 1 alb treatment.  Pt became combative and confused and was given 4 haldol and 5 versed . Alert and oriented with EMS. 94% RA.

## 2024-03-01 ENCOUNTER — Inpatient Hospital Stay: Payer: Self-pay

## 2024-03-01 LAB — BASIC METABOLIC PANEL WITH GFR
Anion gap: 14 (ref 5–15)
Anion gap: 10 (ref 5–15)
Anion gap: 12 (ref 5–15)
BUN: 19 mg/dL (ref 6–20)
BUN: 22 mg/dL — ABNORMAL HIGH (ref 6–20)
BUN: 33 mg/dL — ABNORMAL HIGH (ref 6–20)
CO2: 22 mmol/L (ref 22–32)
CO2: 22 mmol/L (ref 22–32)
CO2: 25 mmol/L (ref 22–32)
Calcium: 9.1 mg/dL (ref 8.9–10.3)
Calcium: 9.1 mg/dL (ref 8.9–10.3)
Calcium: 9 mg/dL (ref 8.9–10.3)
Chloride: 102 mmol/L (ref 98–111)
Chloride: 105 mmol/L (ref 98–111)
Chloride: 103 mmol/L (ref 98–111)
Creatinine, Ser: 1.55 mg/dL — ABNORMAL HIGH (ref 0.44–1.00)
Creatinine, Ser: 1.76 mg/dL — ABNORMAL HIGH (ref 0.44–1.00)
Creatinine, Ser: 2.37 mg/dL — ABNORMAL HIGH (ref 0.44–1.00)
GFR, Estimated: 38 mL/min — ABNORMAL LOW
GFR, Estimated: 33 mL/min — ABNORMAL LOW
GFR, Estimated: 23 mL/min — ABNORMAL LOW
Glucose, Bld: 189 mg/dL — ABNORMAL HIGH (ref 70–99)
Glucose, Bld: 145 mg/dL — ABNORMAL HIGH (ref 70–99)
Glucose, Bld: 137 mg/dL — ABNORMAL HIGH (ref 70–99)
Potassium: 5.6 mmol/L — ABNORMAL HIGH (ref 3.5–5.1)
Potassium: 5.7 mmol/L — ABNORMAL HIGH (ref 3.5–5.1)
Potassium: 5 mmol/L (ref 3.5–5.1)
Sodium: 138 mmol/L (ref 135–145)
Sodium: 137 mmol/L (ref 135–145)
Sodium: 140 mmol/L (ref 135–145)

## 2024-03-01 LAB — CBC
HCT: 38.6 % (ref 36.0–46.0)
Hemoglobin: 12.2 g/dL (ref 12.0–15.0)
MCH: 29.3 pg (ref 26.0–34.0)
MCHC: 31.6 g/dL (ref 30.0–36.0)
MCV: 92.6 fL (ref 80.0–100.0)
Platelets: 286 K/uL (ref 150–400)
RBC: 4.17 MIL/uL (ref 3.87–5.11)
RDW: 13.5 % (ref 11.5–15.5)
WBC: 6 K/uL (ref 4.0–10.5)
nRBC: 0 % (ref 0.0–0.2)

## 2024-03-01 LAB — RESPIRATORY PANEL BY PCR

## 2024-03-01 LAB — HEMOGLOBIN A1C
Hgb A1c MFr Bld: 6.4 % — ABNORMAL HIGH (ref 4.8–5.6)
Mean Plasma Glucose: 136.98 mg/dL

## 2024-03-01 LAB — BLOOD GAS, ARTERIAL
Acid-base deficit: 0.8 mmol/L (ref 0.0–2.0)
Bicarbonate: 25.4 mmol/L (ref 20.0–28.0)
FIO2: 30 %
MECHVT: 450 mL
Mechanical Rate: 22
O2 Saturation: 98.6 %
PEEP: 5 cmH2O
Patient temperature: 37
pCO2 arterial: 47 mmHg (ref 32–48)
pH, Arterial: 7.34 — ABNORMAL LOW (ref 7.35–7.45)
pO2, Arterial: 92 mmHg (ref 83–108)

## 2024-03-01 LAB — POTASSIUM
Potassium: 6.1 mmol/L — ABNORMAL HIGH (ref 3.5–5.1)
Potassium: 5.6 mmol/L — ABNORMAL HIGH (ref 3.5–5.1)

## 2024-03-01 LAB — GLUCOSE, CAPILLARY
Glucose-Capillary: 190 mg/dL — ABNORMAL HIGH (ref 70–99)
Glucose-Capillary: 170 mg/dL — ABNORMAL HIGH (ref 70–99)
Glucose-Capillary: 163 mg/dL — ABNORMAL HIGH (ref 70–99)
Glucose-Capillary: 80 mg/dL (ref 70–99)
Glucose-Capillary: 115 mg/dL — ABNORMAL HIGH (ref 70–99)
Glucose-Capillary: 137 mg/dL — ABNORMAL HIGH (ref 70–99)
Glucose-Capillary: 115 mg/dL — ABNORMAL HIGH (ref 70–99)

## 2024-03-01 LAB — C-REACTIVE PROTEIN: CRP: 0.7 mg/dL

## 2024-03-01 LAB — D-DIMER, QUANTITATIVE: D-Dimer, Quant: 0.47 ug{FEU}/mL (ref 0.00–0.50)

## 2024-03-01 LAB — MAGNESIUM: Magnesium: 3.4 mg/dL — ABNORMAL HIGH (ref 1.7–2.4)

## 2024-03-01 LAB — STREP PNEUMONIAE URINARY ANTIGEN: Strep Pneumo Urinary Antigen: POSITIVE — AB

## 2024-03-01 LAB — PHOSPHORUS: Phosphorus: 3.7 mg/dL (ref 2.5–4.6)

## 2024-03-01 LAB — HIV ANTIBODY (ROUTINE TESTING W REFLEX): HIV Screen 4th Generation wRfx: NONREACTIVE

## 2024-03-01 MED ORDER — SODIUM CHLORIDE 0.9 % IV BOLUS
1000.0000 mL | Freq: Once | INTRAVENOUS | Status: AC
  Administered 2024-03-01: 1000 mL via INTRAVENOUS

## 2024-03-01 MED ORDER — INSULIN ASPART 100 UNIT/ML IJ SOLN
0.0000 [IU] | INTRAMUSCULAR | Status: AC
  Administered 2024-03-01 (×2): 3 [IU] via SUBCUTANEOUS
  Administered 2024-03-01: 2 [IU] via SUBCUTANEOUS
  Administered 2024-03-01 – 2024-03-02 (×2): 3 [IU] via SUBCUTANEOUS
  Administered 2024-03-02: 2 [IU] via SUBCUTANEOUS
  Administered 2024-03-02: 3 [IU] via SUBCUTANEOUS
  Administered 2024-03-03: 2 [IU] via SUBCUTANEOUS
  Administered 2024-03-03 (×2): 3 [IU] via SUBCUTANEOUS
  Administered 2024-03-03 – 2024-03-04 (×3): 2 [IU] via SUBCUTANEOUS
  Administered 2024-03-04: 3 [IU] via SUBCUTANEOUS
  Administered 2024-03-04 (×2): 2 [IU] via SUBCUTANEOUS
  Administered 2024-03-05 (×2): 3 [IU] via SUBCUTANEOUS
  Administered 2024-03-05 – 2024-03-08 (×11): 2 [IU] via SUBCUTANEOUS
  Administered 2024-03-08: 3 [IU] via SUBCUTANEOUS
  Administered 2024-03-09 – 2024-03-12 (×5): 2 [IU] via SUBCUTANEOUS
  Administered 2024-03-13: 3 [IU] via SUBCUTANEOUS
  Administered 2024-03-13: 2 [IU] via SUBCUTANEOUS
  Administered 2024-03-14: 3 [IU] via SUBCUTANEOUS
  Administered 2024-03-14: 5 [IU] via SUBCUTANEOUS
  Administered 2024-03-14: 3 [IU] via SUBCUTANEOUS
  Administered 2024-03-14: 5 [IU] via SUBCUTANEOUS
  Administered 2024-03-14 (×2): 3 [IU] via SUBCUTANEOUS
  Administered 2024-03-15 (×2): 2 [IU] via SUBCUTANEOUS
  Administered 2024-03-15: 3 [IU] via SUBCUTANEOUS
  Administered 2024-03-15: 5 [IU] via SUBCUTANEOUS
  Administered 2024-03-15: 2 [IU] via SUBCUTANEOUS
  Administered 2024-03-15: 3 [IU] via SUBCUTANEOUS
  Administered 2024-03-15: 5 [IU] via SUBCUTANEOUS
  Administered 2024-03-16: 8 [IU] via SUBCUTANEOUS
  Administered 2024-03-16 (×2): 5 [IU] via SUBCUTANEOUS
  Administered 2024-03-16: 8 [IU] via SUBCUTANEOUS
  Administered 2024-03-16 (×2): 3 [IU] via SUBCUTANEOUS
  Administered 2024-03-17: 5 [IU] via SUBCUTANEOUS
  Administered 2024-03-17 (×2): 3 [IU] via SUBCUTANEOUS
  Administered 2024-03-17: 5 [IU] via SUBCUTANEOUS
  Administered 2024-03-17 (×2): 3 [IU] via SUBCUTANEOUS
  Administered 2024-03-18 (×2): 2 [IU] via SUBCUTANEOUS
  Administered 2024-03-18 (×2): 3 [IU] via SUBCUTANEOUS
  Administered 2024-03-18 – 2024-03-19 (×2): 2 [IU] via SUBCUTANEOUS
  Administered 2024-03-19: 5 [IU] via SUBCUTANEOUS
  Administered 2024-03-19 (×2): 3 [IU] via SUBCUTANEOUS
  Administered 2024-03-19: 2 [IU] via SUBCUTANEOUS
  Administered 2024-03-20 (×5): 3 [IU] via SUBCUTANEOUS
  Administered 2024-03-20: 5 [IU] via SUBCUTANEOUS
  Administered 2024-03-21 (×4): 3 [IU] via SUBCUTANEOUS
  Administered 2024-03-21: 5 [IU] via SUBCUTANEOUS
  Administered 2024-03-21: 3 [IU] via SUBCUTANEOUS
  Filled 2024-03-01: qty 3
  Filled 2024-03-01: qty 5
  Filled 2024-03-01: qty 4
  Filled 2024-03-01 (×3): qty 3
  Filled 2024-03-01: qty 2
  Filled 2024-03-01: qty 8
  Filled 2024-03-01: qty 2
  Filled 2024-03-01: qty 3
  Filled 2024-03-01: qty 2
  Filled 2024-03-01: qty 4
  Filled 2024-03-01: qty 2
  Filled 2024-03-01: qty 5
  Filled 2024-03-01: qty 2
  Filled 2024-03-01: qty 4
  Filled 2024-03-01: qty 2
  Filled 2024-03-01: qty 3
  Filled 2024-03-01: qty 2
  Filled 2024-03-01: qty 3
  Filled 2024-03-01: qty 2
  Filled 2024-03-01: qty 3
  Filled 2024-03-01: qty 2
  Filled 2024-03-01: qty 5
  Filled 2024-03-01: qty 2
  Filled 2024-03-01: qty 3
  Filled 2024-03-01: qty 5
  Filled 2024-03-01 (×4): qty 2
  Filled 2024-03-01: qty 3
  Filled 2024-03-01: qty 2
  Filled 2024-03-01 (×2): qty 3
  Filled 2024-03-01 (×4): qty 2
  Filled 2024-03-01 (×3): qty 3
  Filled 2024-03-01: qty 2
  Filled 2024-03-01: qty 3
  Filled 2024-03-01: qty 5
  Filled 2024-03-01: qty 3
  Filled 2024-03-01: qty 2
  Filled 2024-03-01: qty 5
  Filled 2024-03-01: qty 3
  Filled 2024-03-01: qty 2
  Filled 2024-03-01: qty 3
  Filled 2024-03-01: qty 5
  Filled 2024-03-01 (×4): qty 3
  Filled 2024-03-01: qty 2
  Filled 2024-03-01: qty 3
  Filled 2024-03-01: qty 2
  Filled 2024-03-01 (×4): qty 3
  Filled 2024-03-01: qty 2
  Filled 2024-03-01: qty 3
  Filled 2024-03-01: qty 5
  Filled 2024-03-01: qty 3
  Filled 2024-03-01: qty 8
  Filled 2024-03-01: qty 2
  Filled 2024-03-01 (×4): qty 3
  Filled 2024-03-01: qty 2
  Filled 2024-03-01 (×2): qty 3
  Filled 2024-03-01: qty 5
  Filled 2024-03-01: qty 2
  Filled 2024-03-01: qty 5
  Filled 2024-03-01: qty 3
  Filled 2024-03-01 (×3): qty 2

## 2024-03-01 MED ORDER — INSULIN ASPART 100 UNIT/ML IV SOLN
10.0000 [IU] | Freq: Once | INTRAVENOUS | Status: AC
  Administered 2024-03-01: 10 [IU] via INTRAVENOUS
  Filled 2024-03-01: qty 10

## 2024-03-01 MED ORDER — SODIUM ZIRCONIUM CYCLOSILICATE 5 G PO PACK
10.0000 g | PACK | Freq: Two times a day (BID) | ORAL | Status: AC
  Administered 2024-03-01 – 2024-03-02 (×3): 10 g
  Filled 2024-03-01 (×3): qty 2

## 2024-03-01 MED ORDER — SODIUM BICARBONATE 8.4 % IV SOLN
50.0000 meq | Freq: Once | INTRAVENOUS | Status: AC
  Administered 2024-03-01: 50 meq via INTRAVENOUS
  Filled 2024-03-01: qty 50

## 2024-03-01 MED ORDER — PROPOFOL 1000 MG/100ML IV EMUL
INTRAVENOUS | Status: AC
  Administered 2024-03-01: 70 ug/kg/min via INTRAVENOUS
  Filled 2024-03-01: qty 100

## 2024-03-01 MED ORDER — VECURONIUM BROMIDE 10 MG IV SOLR: 10.0000 mg | Freq: Once | INTRAVENOUS | Status: DC

## 2024-03-01 MED ORDER — DEXTROSE 50 % IV SOLN
1.0000 | Freq: Once | INTRAVENOUS | Status: AC
  Administered 2024-03-01: 50 mL via INTRAVENOUS
  Filled 2024-03-01: qty 50

## 2024-03-01 MED ORDER — METOPROLOL TARTRATE 5 MG/5ML IV SOLN: 5.0000 mg | INTRAVENOUS | Status: AC

## 2024-03-01 MED ORDER — LAMOTRIGINE 25 MG PO TABS: 100.0000 mg | ORAL_TABLET | Freq: Two times a day (BID) | ORAL | Status: DC

## 2024-03-01 MED ORDER — SODIUM CHLORIDE 0.9 % IV BOLUS: 500.0000 mL | Freq: Once | INTRAVENOUS | Status: DC

## 2024-03-01 MED ORDER — METHYLPREDNISOLONE SODIUM SUCC 40 MG IJ SOLR: 40.0000 mg | Freq: Two times a day (BID) | INTRAMUSCULAR | Status: DC

## 2024-03-01 MED ORDER — KETAMINE HCL-SODIUM CHLORIDE 1000-0.69 MG/100ML-% IV SOLN
3.0000 mg/kg/h | INTRAVENOUS | Status: DC
  Administered 2024-03-01: 3 mg/kg/h via INTRAVENOUS
  Filled 2024-03-01: qty 100

## 2024-03-01 MED ORDER — FUROSEMIDE 10 MG/ML IJ SOLN: 20.0000 mg | Freq: Once | INTRAMUSCULAR | Status: AC

## 2024-03-01 MED ORDER — SODIUM POLYSTYRENE SULFONATE 15 GM/60ML CO SUSP
30.0000 g | Freq: Once | Status: AC
  Administered 2024-03-01: 30 g via RECTAL
  Filled 2024-03-01: qty 120

## 2024-03-01 MED ORDER — FUROSEMIDE 10 MG/ML IJ SOLN
INTRAMUSCULAR | Status: AC
  Administered 2024-03-01: 20 mg
  Filled 2024-03-01: qty 2

## 2024-03-01 MED ORDER — LAMOTRIGINE 25 MG PO TABS
100.0000 mg | ORAL_TABLET | Freq: Every day | ORAL | Status: AC
  Administered 2024-03-01 – 2024-03-21 (×20): 100 mg
  Filled 2024-03-01 (×20): qty 4

## 2024-03-01 MED ORDER — DEXTROSE 50 % IV SOLN
50.0000 mL | Freq: Once | INTRAVENOUS | Status: AC
  Administered 2024-03-01: 50 mL via INTRAVENOUS
  Filled 2024-03-01: qty 50

## 2024-03-01 MED ORDER — SODIUM ZIRCONIUM CYCLOSILICATE 5 G PO PACK
10.0000 g | PACK | Freq: Once | ORAL | Status: AC
  Administered 2024-03-01: 10 g via ORAL
  Filled 2024-03-01: qty 2

## 2024-03-01 MED ORDER — ACETAMINOPHEN 325 MG PO TABS
650.0000 mg | ORAL_TABLET | Freq: Four times a day (QID) | ORAL | Status: DC | PRN
  Administered 2024-03-01 – 2024-03-15 (×11): 650 mg
  Filled 2024-03-01 (×11): qty 2

## 2024-03-01 MED ORDER — METOPROLOL TARTRATE 5 MG/5ML IV SOLN
INTRAVENOUS | Status: AC
  Administered 2024-03-01: 5 mg
  Filled 2024-03-01: qty 5

## 2024-03-01 MED ORDER — SODIUM CHLORIDE 0.9 % IV SOLN
100.0000 mg | Freq: Two times a day (BID) | INTRAVENOUS | Status: DC
  Filled 2024-03-01: qty 100

## 2024-03-01 MED ORDER — METHYLPREDNISOLONE SODIUM SUCC 40 MG IJ SOLR
40.0000 mg | Freq: Two times a day (BID) | INTRAMUSCULAR | Status: DC
  Administered 2024-03-01 – 2024-03-03 (×5): 40 mg via INTRAVENOUS
  Filled 2024-03-01 (×5): qty 1

## 2024-03-01 MED ORDER — CARMEX CLASSIC LIP BALM EX OINT
1.0000 | TOPICAL_OINTMENT | CUTANEOUS | Status: AC | PRN
  Administered 2024-03-08 – 2024-03-16 (×8): 1 via TOPICAL
  Filled 2024-03-01: qty 0.1

## 2024-03-01 MED ORDER — PROPOFOL 1000 MG/100ML IV EMUL
INTRAVENOUS | Status: AC
  Filled 2024-03-01: qty 100

## 2024-03-01 MED ORDER — PROPOFOL 1000 MG/100ML IV EMUL
0.0000 ug/kg/min | INTRAVENOUS | Status: DC
  Administered 2024-03-02: 60 ug/kg/min via INTRAVENOUS
  Administered 2024-03-02 – 2024-03-03 (×7): 70 ug/kg/min via INTRAVENOUS
  Administered 2024-03-03 (×2): 80 ug/kg/min via INTRAVENOUS
  Administered 2024-03-03 (×2): 70 ug/kg/min via INTRAVENOUS
  Administered 2024-03-03: 30 ug/kg/min via INTRAVENOUS
  Administered 2024-03-04: 40 ug/kg/min via INTRAVENOUS
  Administered 2024-03-04 (×2): 30 ug/kg/min via INTRAVENOUS
  Administered 2024-03-05: 40 ug/kg/min via INTRAVENOUS
  Administered 2024-03-05: 70 ug/kg/min via INTRAVENOUS
  Administered 2024-03-05: 80 ug/kg/min via INTRAVENOUS
  Administered 2024-03-05: 60 ug/kg/min via INTRAVENOUS
  Administered 2024-03-05: 70 ug/kg/min via INTRAVENOUS
  Administered 2024-03-05: 40 ug/kg/min via INTRAVENOUS
  Administered 2024-03-06: 60 ug/kg/min via INTRAVENOUS
  Administered 2024-03-06: 40 ug/kg/min via INTRAVENOUS
  Administered 2024-03-06: 60 ug/kg/min via INTRAVENOUS
  Administered 2024-03-06: 50 ug/kg/min via INTRAVENOUS
  Administered 2024-03-06: 60 ug/kg/min via INTRAVENOUS
  Administered 2024-03-06 – 2024-03-07 (×2): 50 ug/kg/min via INTRAVENOUS
  Administered 2024-03-07: 65 ug/kg/min via INTRAVENOUS
  Administered 2024-03-07: 45 ug/kg/min via INTRAVENOUS
  Administered 2024-03-07: 60 ug/kg/min via INTRAVENOUS
  Administered 2024-03-07: 55 ug/kg/min via INTRAVENOUS
  Administered 2024-03-07: 75 ug/kg/min via INTRAVENOUS
  Administered 2024-03-08 (×4): 70 ug/kg/min via INTRAVENOUS
  Filled 2024-03-01 (×38): qty 100

## 2024-03-01 MED ORDER — ALBUTEROL SULFATE (2.5 MG/3ML) 0.083% IN NEBU
2.5000 mg | INHALATION_SOLUTION | RESPIRATORY_TRACT | Status: DC | PRN
  Administered 2024-03-01 – 2024-03-07 (×5): 2.5 mg via RESPIRATORY_TRACT
  Filled 2024-03-01 (×7): qty 3

## 2024-03-01 MED ORDER — SODIUM CHLORIDE 0.9 % IV SOLN
1.0000 g | INTRAVENOUS | Status: DC
  Administered 2024-03-01 – 2024-03-04 (×4): 1 g via INTRAVENOUS
  Filled 2024-03-01 (×5): qty 10

## 2024-03-01 MED ORDER — NOREPINEPHRINE 4 MG/250ML-% IV SOLN
0.0000 ug/min | INTRAVENOUS | Status: DC
  Administered 2024-03-01: 2 ug/min via INTRAVENOUS
  Filled 2024-03-01: qty 250

## 2024-03-01 MED ORDER — CITALOPRAM HYDROBROMIDE 20 MG PO TABS
40.0000 mg | ORAL_TABLET | Freq: Every day | ORAL | Status: AC
  Administered 2024-03-01 – 2024-03-21 (×20): 40 mg
  Filled 2024-03-01 (×20): qty 2

## 2024-03-01 MED ORDER — SODIUM CHLORIDE 0.9 % IV SOLN: INTRAVENOUS | Status: AC | PRN

## 2024-03-01 NOTE — Progress Notes (Signed)
 PHARMACY CONSULT NOTE - ELECTROLYTES  Pharmacy Consult for Electrolyte Monitoring and Replacement   Recent Labs: Height: 5' 6 (167.6 cm) Weight: 75.6 kg (166 lb 10.7 oz) IBW/kg (Calculated) : 59.3 Estimated Creatinine Clearance: 36.2 mL/min (A) (by C-G formula based on SCr of 1.76 mg/dL (H)).  Potassium (mmol/L)  Date Value  03/01/2024 5.7 (H)  11/12/2013 4.1   Magnesium  (mg/dL)  Date Value  98/82/7973 3.4 (H)   Calcium  (mg/dL)  Date Value  98/82/7973 9.1   Calcium , Total (mg/dL)  Date Value  90/69/7984 9.0   Albumin (g/dL)  Date Value  98/83/7973 4.6  05/12/2020 4.5  11/12/2013 3.5   Phosphorus (mg/dL)  Date Value  98/82/7973 3.7   Sodium (mmol/L)  Date Value  03/01/2024 137  12/06/2022 139  11/12/2013 141    Assessment  Erica Gomez is a 59 y.o. female presenting with respiratory failute. PMH significant for COPD, asthma, CAD, seizures, and bipolar disorder . Pharmacy has been consulted to monitor and replace electrolytes.  Diet: none  Goal of Therapy: Electrolytes WNL  Plan:  HyperK , Kayexalate  ordered by provider. NO replacement needed today. Follow with AM labs  Thank you for allowing pharmacy to be a part of this patient's care.  Gearold Wainer Rodriguez-Guzman PharmD, BCPS 03/01/2024 9:10 AM

## 2024-03-01 NOTE — Progress Notes (Signed)
 "   NAME:  Erica Gomez, MRN:  [id number], DOB:  Jul 23, 1965, LOS: 1 ADMISSION DATE:  02/29/2024, CONSULTATION DATE:  02/29/24 REFERRING MD:  [name] CHIEF COMPLAINT: Acute Respiratory Distress   HPI  59 y/o F with PMH sig for asthma, COPD, tobacco use disorder, bipolar disorder, seizure, polysubstance abuse, anxiety/depression, and HLD presented to ED BIB [facility] from home for acute respiratory distress.  On Chart review, patient follows with pulmonologist; last visit with Dr. Isadora on 10/25. Meds prescribed at visit included fluticasone  furoate (Arnuity Ellipta ) 200 mcg 1 puff daily and umeclidinium-vilanterol (Anoro Ellipta ) 62.5/25 mcg 1 puff daily. Per husband, patient has been taking medications as prescribed. EMS tx included MgSO? 2 g IV, Solu-Medrol  125 mg IV, DuoNeb x1, and albuterol  x1. During transport, pt became combative/confused and received Haldol 4 mg IM and Versed  5 mg IM. Initially A&O with EMS. SpO? 94% on RA.  ED Course: Initial VS: BP 211/120, HR 142, SpO? 94% on RA. Patient was unresponsive with labored/heaving respirations and in severe resp distress. Concern for hypercapnic resp failure contributing to agitation and AMS. Pt subsequently lost ability to protect airway. Given worsening MS and resp failure, decision made for emergent intubation. Pt received ketamine  and rocuronium  and was intubated successfully without complication.  Labs/Imaging: Labs: WBC 10.7, Hgb 12.7, Hct 40.9, Plt 313. BMP: Na 141, K 4.9, Cl 106, CO? 25, BUN 14, Cr 0.81, Glu 147, Ca 8.9. LFTs WNL: AST 23, ALT 20, Alk Phos 90, Tbili <0.2, Alb 4.6. VBG c/w resp acidosis/hypercapnia (HCO? 29.8), O? sat 98.3. Lactate 1.7. CXR: no acute cardiopulm process; ETT in mid trachea, enteric tube in stomach. CT head ordered and pending for AMS.  PCCM consulted for admission.  Past Medical History  Asthma COPD Tobacco use d/o Bipolar Disorder Seizure Disorder Polysubstance  abuse Anxiety/depression HLD Significant Hospital Events   1/16: Admitted to ICU for asthma/COPD overlap exacerbation with hypercapnic respiratory failure requiring emergent intubation. 03/01/24- patient failed SBT today, remains on mV. She has AKI.   Strep pneumoniae +, will continue Rocephin .  Husband at bedside I met with him and we reviewed medical plan.     Procedures:  1/16:Endotrachael Intubation  Interim History / Subjective:    -see significant events  Micro Data:  02/29/24 SARS-CoV-2 PCR ? 02/29/24 Flu PCR ? 02/29/24 RVP ? 02/29/24 BCx2 ? 02/29/24 UCx ? 02/29/24 MRSA PCR ? 02/29/24 Strep pneumo Ur Ag ? 02/29/24 Legionella Ur Ag ?  Antimicrobials:    OBJECTIVE  Blood pressure 107/62, pulse 93, temperature 98.1 F (36.7 C), resp. rate 14, height 5' 6 (1.676 m), weight 75.6 kg, SpO2 96%.    Vent Mode: PRVC FiO2 (%):  [30 %-40 %] 30 % Set Rate:  [22 bmp] 22 bmp Vt Set:  [450 mL] 450 mL PEEP:  [5 cmH20] 5 cmH20 Plateau Pressure:  [25 cmH20] 25 cmH20   Intake/Output Summary (Last 24 hours) at 03/01/2024 0830 Last data filed at 03/01/2024 0733 Gross per 24 hour  Intake 1474.31 ml  Output 425 ml  Net 1049.31 ml   Filed Weights   02/29/24 1824 03/01/24 0500  Weight: 77.5 kg 75.6 kg    Physical Examination  GEN: Critically ill patient, intubated and sedated HEENT: [place]/AT. PERRL, sclerae anicteric. HEART: regular rhythm, normal rate, S1, S2, no M/R/G,  LUNGS: CTAB, diminished bilateral without wheezes, no increased WOB,  EXTREMITIES: No Edema, cap refill  NEURO: No gross focal deficits. PSYCH:  UTA ABDOMINAL: Soft: BS x 4, NTND SKIN:  Intact, warm, no rashes lesion, or ulcer  RESULTS  Strep pneumoniae urinary antigen (not at [facility]) Order: [id number]  Status: Final result     Next appt: 06/09/2024 at 09:00 AM in Pulmonology ([facility])   Test Result Released: Yes (not seen)   0 Result Notes    Component Ref Range & Units (hover) 1 d ago  Strep  Pneumo Urinary Antigen POSITIVE Abnormal          Assessment & Plan:   #Acute hypoxemic Hypercapnic Respiratory Failure  -strep pneumoniae induced AECOPD -c/w rocephin /steroids -lasix  20 x1 today Hx of Severe asthma/COPD overlap; hypercapnia, AMS; smoker on Arnuity, Anoro -PRVC vent: FiO? 35%, Vt 450 mL, RR 22, PEEP 5; titrate per ABG -IV methylprednisolone  -Neb ipratropium-albuterol  q4h + PRN q6h -propofol  + fentanyl , RASS -1 -PRN ABG &Xray -Nicotine  replacement therapy  Sepsis - due to pneumonia with strep pneumoniae   Present on admission     Continue rocephin  IV   #AMS septic encephalopathy due to pneumonia Multifactorial (hypercapnia, hypoxia, psych dz, ICU stress); CT head neg -Titrate sedation to RASS -1 -Daily neuro checks + delirium screen  #Seizure Disorder Hx seizures, no recent events -Continue lamotrigine  via tube -Seizure precautions -PRN benzo if breakthrough -Avoid tramadol/other seizure-lowering meds -Monitor metabolic/electrolyte derangements    #Hypertension #Hyperlipidemia Chronic, poorly controlled; ED BP 211/120 ? 128/89 -Treat per ICU protocol if persistent -Resume home anti-HTN when stable -resume rosuvastatin  once enteral access  #Psych (Anxiety/Depression/Bipolar) -Resume home citalopram , Atarax , Adderall PO -Psychiatry consult if agitation persists post-extubation  #Prediabetes / Glycemic Control HbA1c 6.4-6.7%; CBG 147-177 -Check glucose q4-6h -SSI  -Goal 140-180 mg/dL -Hold metformin  while NPO -Early enteral nutrition when feasible  #Polysubstance / Tobacco Use Active smoker, past polysubstance abuse -UDS +Amphetamine /on Adderall at home -Nicotine  replacement therapy -Counseling/support post-extubation -Avoid sedatives that worsen withdrawal  Best practice:  Diet:  NPO Pain/Anxiety/Delirium protocol (if indicated): Yes (RASS goal -1) VAP protocol (if indicated): Yes DVT prophylaxis: LMWH GI prophylaxis: H2B Glucose  control:  SSI No Central venous access:  N/A Arterial line:  N/A Foley:  N/A Mobility:  bed rest  PT/OT consulted: N/A Code Status:  full code Disposition: ICU   = Goals of Care =  Primary Emergency Contact: Menge,John   Labs/imaging that I havepersonally reviewed  (right click and Reselect all SmartList Selections daily)   CT Head Wo Contrast Result Date: 02/29/2024 CLINICAL DATA:  Mental status change EXAM: CT HEAD WITHOUT CONTRAST TECHNIQUE: Contiguous axial images were obtained from the base of the skull through the vertex without intravenous contrast. RADIATION DOSE REDUCTION: This exam was performed according to the departmental dose-optimization program which includes automated exposure control, adjustment of the mA and/or kV according to patient size and/or use of iterative reconstruction technique. COMPARISON:  CT 12/07/2019, MRI 02/20/2020 FINDINGS: Brain: No acute territorial infarction, hemorrhage or intracranial mass. The ventricles are nonenlarged Vascular: No hyperdense vessels.  No unexpected calcification Skull: No fracture Sinuses/Orbits: No acute finding. Other: Incompletely visualized endotracheal and enteric tubes. IMPRESSION: Negative non contrasted CT appearance of the brain. Electronically Signed   By: Luke Bun M.D.   On: 02/29/2024 20:03   DG Abdomen 1 View Result Date: 02/29/2024 EXAM: 1 VIEW XRAY OF THE ABDOMEN 02/29/2024 06:59:11 PM COMPARISON: None available. CLINICAL HISTORY: POST INTUBATION FINDINGS: LINES, TUBES AND DEVICES: NG tube tip is in the stomach. BOWEL: Nonobstructive bowel gas pattern. SOFT TISSUES: No abnormal calcifications. BONES: No acute fracture. IMPRESSION: 1. Enteric tube tip terminates in the  stomach. 2. Nonobstructive bowel gas pattern. Electronically signed by: Franky Crease MD 02/29/2024 07:23 PM EST RP Workstation: [id number]   DG Chest Portable 1 View Result Date: 02/29/2024 EXAM: 1 VIEW XRAY OF THE CHEST 02/29/2024 06:59:11 PM  COMPARISON: None available. CLINICAL HISTORY: SOB SOB SOB FINDINGS: LINES, TUBES AND DEVICES: Endotracheal tube tip in the mid trachea. NG tube enters the stomach. LUNGS AND PLEURA: No confluent airspace opacities. No effusions. No pneumothorax. HEART AND MEDIASTINUM: No acute abnormality of the cardiac and mediastinal silhouettes. BONES AND SOFT TISSUES: No acute osseous abnormality. IMPRESSION: 1. No acute cardiopulmonary process. 2. Endotracheal tube tip in the mid trachea and enteric tube courses into the stomach. Electronically signed by: Franky Crease MD 02/29/2024 07:22 PM EST RP Workstation: [id number]     Labs   CBC: Recent Labs  Lab 02/29/24 1833 03/01/24 0201  WBC 10.7* 6.0  NEUTROABS 5.2  --   HGB 12.7 12.2  HCT 40.9 38.6  MCV 94.9 92.6  PLT 313 286    Basic Metabolic Panel: Recent Labs  Lab 02/29/24 1833 03/01/24 0201 03/01/24 0614  NA 141 138 137  K 4.9 5.6* 5.7*  CL 106 102 105  CO2 25 22 22   GLUCOSE 147* 189* 145*  BUN 14 19 22*  CREATININE 0.81 1.55* 1.76*  CALCIUM  8.9 9.1 9.1  MG  --  3.4*  --   PHOS  --  3.7  --    GFR: Estimated Creatinine Clearance: 36.2 mL/min (A) (by C-G formula based on SCr of 1.76 mg/dL (H)). Recent Labs  Lab 02/29/24 1833 02/29/24 1834 02/29/24 2018 03/01/24 0201  PROCALCITON  --   --  <0.10  --   WBC 10.7*  --   --  6.0  LATICACIDVEN  --  1.7 1.7  --     Liver Function Tests: Recent Labs  Lab 02/29/24 1833  AST 23  ALT 20  ALKPHOS 90  BILITOT <0.2  PROT 7.1  ALBUMIN 4.6   No results for input(s): LIPASE, AMYLASE in the last 168 hours. No results for input(s): AMMONIA in the last 168 hours.  ABG    Component Value Date/Time   PHART 7.34 (L) 03/01/2024 0432   PCO2ART 47 03/01/2024 0432   PO2ART 92 03/01/2024 0432   HCO3 25.4 03/01/2024 0432   ACIDBASEDEF 0.8 03/01/2024 0432   O2SAT 98.6 03/01/2024 0432     Coagulation Profile: No results for input(s): INR, PROTIME in the last 168  hours.  Cardiac Enzymes: No results for input(s): CKTOTAL, CKMB, CKMBINDEX, TROPONINI in the last 168 hours.  HbA1C: Hemoglobin A1C  Date/Time Value Ref Range Status  09/21/2021 02:00 PM 6.4 (A) 4.0 - 5.6 % Final  03/22/2021 09:52 AM 6.2 (A) 4.0 - 5.6 % Final  01/15/2014 12:00 AM 6.8  Final   HbA1c, POC (prediabetic range)  Date/Time Value Ref Range Status  05/11/2020 10:30 AM 0 (A) 5.7 - 6.4 % Final   HbA1c, POC (controlled diabetic range)  Date/Time Value Ref Range Status  05/11/2020 10:30 AM 0.0 0.0 - 7.0 % Final   HbA1c POC (<> result, manual entry)  Date/Time Value Ref Range Status  05/11/2020 10:30 AM 6.5 4.0 - 5.6 % Final   Hgb A1c MFr Bld  Date/Time Value Ref Range Status  03/01/2024 02:02 AM 6.4 (H) 4.8 - 5.6 % Final    Comment:    (NOTE) Diagnosis of Diabetes The following HbA1c ranges recommended by the American Diabetes Association (ADA) may be used as an  aid in the diagnosis of diabetes mellitus.  Hemoglobin             Suggested A1C NGSP%              Diagnosis  <5.7                   Non Diabetic  5.7-6.4                Pre-Diabetic  >6.4                   Diabetic  <7.0                   Glycemic control for                       adults with diabetes.    12/24/2019 11:08 AM 6.7 (H) 4.8 - 5.6 % Final    Comment:             Prediabetes: 5.7 - 6.4          Diabetes: >6.4          Glycemic control for adults with diabetes: <7.0    CBG: Recent Labs  Lab 02/29/24 2300 03/01/24 0157 03/01/24 0309 03/01/24 0801  GLUCAP 177* 190* 170* 163*    Review of Systems:   Unable to be obtained secondary to the patient's intubated and sedated status.   Past Medical History  She,  has a past medical history of Allergy, Anxiety, Asthma, Bilateral carpal tunnel syndrome (12/31/2018), Bipolar affective, manic (HCC) (1996), COPD (chronic obstructive pulmonary disease) (HCC), Depression, HLD (hyperlipidemia) (03/12/2018), Lung nodule, multiple  (09/26/2016), Manic depressive illness (HCC) (1996), Seizures (HCC), Substance abuse (HCC), and Tobacco abuse (10/04/2016).   Surgical History    Past Surgical History:  Procedure Laterality Date   ABDOMINAL HYSTERECTOMY     CARPAL TUNNEL RELEASE Right 01/15/2019   Procedure: RIGHTCARPAL TUNNEL RELEASE, LEFT DEQUERVAINS INJECTION;  Surgeon: Jerri Kay HERO, MD;  Location: [facility];  Service: Orthopedics;  Laterality: Right;   CARPAL TUNNEL RELEASE Left 02/26/2019   Procedure: LEFT CARPAL TUNNEL RELEASE;  Surgeon: Jerri Kay HERO, MD;  Location: [facility];  Service: Orthopedics;  Laterality: Left;  Bier block   COLONOSCOPY WITH PROPOFOL  N/A 11/28/2018   Procedure: COLONOSCOPY WITH PROPOFOL ;  Surgeon: Therisa Bi, MD;  Location[facility];  Service: Gastroenterology;  Laterality: N/A;   TUBAL LIGATION       Social History   reports that she has been smoking cigarettes. Her smokeless tobacco use includes chew and snuff. She reports that she does not currently use alcohol. She reports that she does not use drugs.   Family History   Her family history includes Bipolar disorder in her brother and brother; CAD in her father; Heart attack in her father; Other in her mother; Schizophrenia in her brother and brother; Stroke in her mother.   Allergies Allergies[1]   Home Medications  Prior to Admission medications  Medication Sig Start Date End Date Taking? Authorizing Provider  albuterol  (PROVENTIL  HFA) 108 (90 Base) MCG/ACT inhaler Inhale 2 puffs into the lungs every 4-6 hours as needed. 09/21/21   Iloabachie, Chioma E, NP  amphetamine-dextroamphetamine (ADDERALL XR) 10 MG 24 hr capsule Take 10 mg by mouth daily.    [provider]  citalopram  (CELEXA ) 40 MG tablet Take 1 tablet (40 mg total) by mouth daily. 11/21/22     Fluticasone   Furoate (ARNUITY ELLIPTA ) 200 MCG/ACT AEPB Inhale 1 puff into the lungs daily. 12/06/23   Isadora Hose, MD  hydrOXYzine   (ATARAX ) 50 MG tablet Take 1-1.5 tablets (50-75 mg total) by mouth at bedtime as needed for sleep 11/21/22     ipratropium-albuterol  (DUONEB) 0.5-2.5 (3) MG/3ML SOLN INHALE CONTENTS OF ONE VIAL ( TOTAL) USING NEBULIZER  ONCE EVERY 6 HOURS. 11/02/20 12/06/23  Iloabachie, Chioma E, NP  lamoTRIgine  (LAMICTAL ) 100 MG tablet Take 1 tablet (100 mg total) by mouth 2 (two) times daily. 06/23/20   Gayland Lauraine PARAS, NP  lamoTRIgine  (LAMICTAL ) 100 MG tablet Take 1 tablet (100 mg total) by mouth daily. 11/21/22     meloxicam  (MOBIC ) 15 MG tablet Take 15 mg by mouth daily.    [provider]  metFORMIN  (GLUCOPHAGE ) 500 MG tablet TAKE ONE TABLET BY MOUTH TWICE DAILY WITH MEALS. 09/21/21   Iloabachie, Chioma E, NP  methocarbamol  (ROBAXIN ) 500 MG tablet Take 500 mg by mouth at bedtime.    [provider]  nicotine  polacrilex (NICOTINE  MINI) 2 MG lozenge Take 1 lozenge (2 mg total) by mouth every 2 (two) hours as needed for smoking cessation. 12/06/23 03/05/24  Isadora Hose, MD  rosuvastatin  (CRESTOR ) 10 MG tablet Take 10 mg by mouth at bedtime. 08/13/23   [provider]  traMADol (ULTRAM) 50 MG tablet Take 1 tablet by mouth every 6 (six) hours as needed.    [provider]  triamcinolone  cream (KENALOG ) 0.1 % Apply 1 Application topically 2 (two) times daily. 09/21/21   Iloabachie, Chioma E, NP  umeclidinium-vilanterol (ANORO ELLIPTA ) 62.5-25 MCG/ACT AEPB Inhale 1 puff into the lungs daily. 12/06/23   Isadora Hose, MD  atomoxetine  (STRATTERA ) 25 MG capsule Take 1 capsule (25 mg total) by mouth once every evening after dinner. Patient not taking: Reported on 12/23/2020 10/26/20 03/09/21    loratadine  (CLARITIN ) 10 MG tablet Take 1 tablet (10 mg total) by mouth daily as needed for allergies. 12/16/19 12/25/19  Iloabachie, Chioma E, NP   Scheduled Meds:  Chlorhexidine  Gluconate Cloth  6 each Topical Daily   citalopram   40 mg Per Tube Daily   enoxaparin  (LOVENOX ) injection  40 mg  Subcutaneous Q24H   insulin  aspart  0-15 Units Subcutaneous Q4H   ipratropium-albuterol   3 mL Nebulization Q4H   lamoTRIgine   100 mg Per Tube Daily   [START ON 03/02/2024] methylPREDNISolone  (SOLU-MEDROL ) injection  40 mg Intravenous Q12H   mouth rinse  15 mL Mouth Rinse Q2H   sodium polystyrene  30 g Rectal Once   Continuous Infusions:  cefTRIAXone  (ROCEPHIN )  IV Stopped (03/01/24 0241)   doxycycline  (VIBRAMYCIN ) IV     fentaNYL  infusion INTRAVENOUS 100 mcg/hr (03/01/24 0700)   norepinephrine  (LEVOPHED ) Adult infusion Stopped (03/01/24 0209)   propofol  (DIPRIVAN ) infusion 40 mcg/kg/min (03/01/24 0700)   PRN Meds:.fentaNYL , ipratropium-albuterol , midazolam  PF, mouth rinse, polyethylene glycol, senna  Critical care provider statement:   Total critical care time: 33 minutes   Performed by: Parris MD   Critical care time was exclusive of separately billable procedures and treating other patients.   Critical care was necessary to treat or prevent imminent or life-threatening deterioration.   Critical care was time spent personally by me on the following activities: development of treatment plan with patient and/or surrogate as well as nursing, discussions with consultants, evaluation of patient's response to treatment, examination of patient, obtaining history from patient or surrogate, ordering and performing treatments and interventions, ordering and review of laboratory studies, ordering and review  of radiographic studies, pulse oximetry and re-evaluation of patient's condition.    Aldene Hendon, M.D.  Pulmonary & Critical Care Medicine          [1]  Allergies Allergen Reactions   Flax Seed Oil [Flax Seed Oil] Hives    Pt. Self reported   Other Hives    Pt. Self reported   Trazodone Other (See Comments)    Bed wetting  Other Reaction(s): Other (See Comments)    Bed wetting   "

## 2024-03-01 NOTE — Plan of Care (Signed)
" °  Problem: Clinical Measurements: Goal: Ability to maintain clinical measurements within normal limits will improve Outcome: Progressing Goal: Will remain free from infection Outcome: Progressing Goal: Diagnostic test results will improve Outcome: Progressing Goal: Respiratory complications will improve Outcome: Progressing Goal: Cardiovascular complication will be avoided Outcome: Progressing   Problem: Activity: Goal: Risk for activity intolerance will decrease Outcome: Progressing   Problem: Nutrition: Goal: Adequate nutrition will be maintained Outcome: Not Progressing   Problem: Coping: Goal: Level of anxiety will decrease Outcome: Progressing   Problem: Elimination: Goal: Will not experience complications related to bowel motility Outcome: Progressing Goal: Will not experience complications related to urinary retention Outcome: Progressing   Problem: Pain Managment: Goal: General experience of comfort will improve and/or be controlled Outcome: Progressing   Problem: Safety: Goal: Ability to remain free from injury will improve Outcome: Progressing   Problem: Skin Integrity: Goal: Risk for impaired skin integrity will decrease Outcome: Progressing   Problem: Fluid Volume: Goal: Ability to maintain a balanced intake and output will improve Outcome: Progressing   Problem: Metabolic: Goal: Ability to maintain appropriate glucose levels will improve Outcome: Progressing   Problem: Nutritional: Goal: Maintenance of adequate nutrition will improve Outcome: Not Progressing Goal: Progress toward achieving an optimal weight will improve Outcome: Not Progressing   Problem: Skin Integrity: Goal: Risk for impaired skin integrity will decrease Outcome: Progressing   Problem: Tissue Perfusion: Goal: Adequacy of tissue perfusion will improve Outcome: Progressing   "

## 2024-03-02 ENCOUNTER — Inpatient Hospital Stay: Payer: Self-pay

## 2024-03-02 LAB — GLUCOSE, CAPILLARY
Glucose-Capillary: 156 mg/dL — ABNORMAL HIGH (ref 70–99)
Glucose-Capillary: 170 mg/dL — ABNORMAL HIGH (ref 70–99)
Glucose-Capillary: 119 mg/dL — ABNORMAL HIGH (ref 70–99)
Glucose-Capillary: 130 mg/dL — ABNORMAL HIGH (ref 70–99)
Glucose-Capillary: 114 mg/dL — ABNORMAL HIGH (ref 70–99)
Glucose-Capillary: 117 mg/dL — ABNORMAL HIGH (ref 70–99)

## 2024-03-02 LAB — BASIC METABOLIC PANEL WITH GFR
Anion gap: 13 (ref 5–15)
Anion gap: 9 (ref 5–15)
BUN: 33 mg/dL — ABNORMAL HIGH (ref 6–20)
BUN: 29 mg/dL — ABNORMAL HIGH (ref 6–20)
CO2: 23 mmol/L (ref 22–32)
CO2: 26 mmol/L (ref 22–32)
Calcium: 9 mg/dL (ref 8.9–10.3)
Calcium: 9.4 mg/dL (ref 8.9–10.3)
Chloride: 102 mmol/L (ref 98–111)
Chloride: 104 mmol/L (ref 98–111)
Creatinine, Ser: 2.26 mg/dL — ABNORMAL HIGH (ref 0.44–1.00)
Creatinine, Ser: 1.55 mg/dL — ABNORMAL HIGH (ref 0.44–1.00)
GFR, Estimated: 24 mL/min — ABNORMAL LOW
GFR, Estimated: 38 mL/min — ABNORMAL LOW
Glucose, Bld: 180 mg/dL — ABNORMAL HIGH (ref 70–99)
Glucose, Bld: 127 mg/dL — ABNORMAL HIGH (ref 70–99)
Potassium: 5.7 mmol/L — ABNORMAL HIGH (ref 3.5–5.1)
Potassium: 5.4 mmol/L — ABNORMAL HIGH (ref 3.5–5.1)
Sodium: 138 mmol/L (ref 135–145)
Sodium: 138 mmol/L (ref 135–145)

## 2024-03-02 LAB — CBC
HCT: 37.7 % (ref 36.0–46.0)
Hemoglobin: 11.6 g/dL — ABNORMAL LOW (ref 12.0–15.0)
MCH: 29.4 pg (ref 26.0–34.0)
MCHC: 30.8 g/dL (ref 30.0–36.0)
MCV: 95.4 fL (ref 80.0–100.0)
Platelets: 292 K/uL (ref 150–400)
RBC: 3.95 MIL/uL (ref 3.87–5.11)
RDW: 14 % (ref 11.5–15.5)
WBC: 11.6 K/uL — ABNORMAL HIGH (ref 4.0–10.5)
nRBC: 0 % (ref 0.0–0.2)

## 2024-03-02 LAB — C-REACTIVE PROTEIN: CRP: 1.5 mg/dL — ABNORMAL HIGH

## 2024-03-02 LAB — MAGNESIUM: Magnesium: 3.2 mg/dL — ABNORMAL HIGH (ref 1.7–2.4)

## 2024-03-02 LAB — TRIGLYCERIDES: Triglycerides: 147 mg/dL

## 2024-03-02 LAB — PHOSPHORUS: Phosphorus: 5.9 mg/dL — ABNORMAL HIGH (ref 2.5–4.6)

## 2024-03-02 MED ORDER — INSULIN ASPART 100 UNIT/ML IV SOLN
10.0000 [IU] | Freq: Once | INTRAVENOUS | Status: AC
  Administered 2024-03-02: 10 [IU] via INTRAVENOUS
  Filled 2024-03-02: qty 10

## 2024-03-02 MED ORDER — BISACODYL 10 MG RE SUPP
10.0000 mg | Freq: Once | RECTAL | Status: AC
  Administered 2024-03-02: 10 mg via RECTAL
  Filled 2024-03-02: qty 1

## 2024-03-02 MED ORDER — ENOXAPARIN SODIUM 30 MG/0.3ML IJ SOSY
30.0000 mg | PREFILLED_SYRINGE | INTRAMUSCULAR | Status: DC
  Administered 2024-03-02: 30 mg via SUBCUTANEOUS
  Filled 2024-03-02: qty 0.3

## 2024-03-02 MED ORDER — DEXTROSE 50 % IV SOLN
50.0000 mL | Freq: Once | INTRAVENOUS | Status: AC
  Administered 2024-03-02: 50 mL via INTRAVENOUS
  Filled 2024-03-02: qty 50

## 2024-03-02 NOTE — Progress Notes (Signed)
 PHARMACY CONSULT NOTE - ELECTROLYTES  Pharmacy Consult for Electrolyte Monitoring and Replacement   Recent Labs: Height: 5' 6 (167.6 cm) Weight: 75.6 kg (166 lb 10.7 oz) IBW/kg (Calculated) : 59.3 Estimated Creatinine Clearance: 28.2 mL/min (A) (by C-G formula based on SCr of 2.26 mg/dL (H)).  Potassium (mmol/L)  Date Value  03/02/2024 5.7 (H)  11/12/2013 4.1   Magnesium  (mg/dL)  Date Value  98/81/7973 3.2 (H)   Calcium  (mg/dL)  Date Value  98/81/7973 9.0   Calcium , Total (mg/dL)  Date Value  90/69/7984 9.0   Albumin (g/dL)  Date Value  98/83/7973 4.6  05/12/2020 4.5  11/12/2013 3.5   Phosphorus (mg/dL)  Date Value  98/81/7973 5.9 (H)   Sodium (mmol/L)  Date Value  03/02/2024 138  12/06/2022 139  11/12/2013 141    Assessment  Erica Gomez is a 59 y.o. female presenting with respiratory failute. PMH significant for COPD, asthma, CAD, seizures, and bipolar disorder . Pharmacy has been consulted to monitor and replace electrolytes.  Diet: none  Goal of Therapy: Electrolytes WNL  Plan:  HyperK management already ordered by provider. NO replacement needed today. Follow with AM labs  Thank you for allowing pharmacy to be a part of this patient's care.  Desirea Mizrahi Rodriguez-Guzman PharmD, BCPS 03/02/2024 8:10 AM

## 2024-03-02 NOTE — Progress Notes (Signed)
 Attempted to contact patient's husband to obtain consent for hemodialysis catheter placement due to worsening renal function; unable to reach and unable to leave voicemail. Nephrology consulted. Patient remains intubated and sedated.    Almarie Nose DNP, CCRN, FNP-C, AGACNP-BC Acute Care & Family Nurse Practitioner [facility] PCCM on call pager [phone]

## 2024-03-02 NOTE — Progress Notes (Signed)
 "   NAME:  Erica Gomez, MRN:  [id number], DOB:  1965/04/08, LOS: 2 ADMISSION DATE:  02/29/2024, CONSULTATION DATE:  02/29/24 REFERRING MD:  [name] CHIEF COMPLAINT: Acute Respiratory Distress   HPI  59 y/o F with PMH sig for asthma, COPD, tobacco use disorder, bipolar disorder, seizure, polysubstance abuse, anxiety/depression, and HLD presented to ED BIB [facility] from home for acute respiratory distress.  On Chart review, patient follows with pulmonologist; last visit with Dr. Isadora on 10/25. Meds prescribed at visit included fluticasone  furoate (Arnuity Ellipta ) 200 mcg 1 puff daily and umeclidinium-vilanterol (Anoro Ellipta ) 62.5/25 mcg 1 puff daily. Per husband, patient has been taking medications as prescribed. EMS tx included MgSO? 2 g IV, Solu-Medrol  125 mg IV, DuoNeb x1, and albuterol  x1. During transport, pt became combative/confused and received Haldol 4 mg IM and Versed  5 mg IM. Initially A&O with EMS. SpO? 94% on RA.  ED Course: Initial VS: BP 211/120, HR 142, SpO? 94% on RA. Patient was unresponsive with labored/heaving respirations and in severe resp distress. Concern for hypercapnic resp failure contributing to agitation and AMS. Pt subsequently lost ability to protect airway. Given worsening MS and resp failure, decision made for emergent intubation. Pt received ketamine  and rocuronium  and was intubated successfully without complication.  Labs/Imaging: Labs: WBC 10.7, Hgb 12.7, Hct 40.9, Plt 313. BMP: Na 141, K 4.9, Cl 106, CO? 25, BUN 14, Cr 0.81, Glu 147, Ca 8.9. LFTs WNL: AST 23, ALT 20, Alk Phos 90, Tbili <0.2, Alb 4.6. VBG c/w resp acidosis/hypercapnia (HCO? 29.8), O? sat 98.3. Lactate 1.7. CXR: no acute cardiopulm process; ETT in mid trachea, enteric tube in stomach. CT head ordered and pending for AMS.   Past Medical History  Asthma COPD Tobacco use d/o Bipolar Disorder Seizure Disorder Polysubstance abuse Anxiety/depression HLD Significant Hospital Events    1/16: Admitted to ICU for asthma/COPD overlap exacerbation with hypercapnic respiratory failure requiring emergent intubation. 03/01/24- patient failed SBT today, remains on mV. She has AKI.   Strep pneumoniae +, will continue Rocephin .  Husband at bedside I met with him and we reviewed medical plan.  03/02/24- patient had resp distress overnight.  She still has AKI but there is an interval improvement.    Procedures:  1/16:Endotrachael Intubation  Interim History / Subjective:    -see significant events  Micro Data:  02/29/24 SARS-CoV-2 PCR ? 02/29/24 Flu PCR ? 02/29/24 RVP ? 02/29/24 BCx2 ? 02/29/24 UCx ? 02/29/24 MRSA PCR ? 02/29/24 Strep pneumo Ur Ag ? 02/29/24 Legionella Ur Ag ?  Antimicrobials:    OBJECTIVE  Blood pressure 114/61, pulse 100, temperature 98.6 F (37 C), resp. rate (!) 22, height 5' 6 (1.676 m), weight 75.6 kg, SpO2 98%.    Vent Mode: PRVC FiO2 (%):  [30 %-35 %] 35 % Set Rate:  [22 bmp] 22 bmp Vt Set:  [450 mL] 450 mL PEEP:  [5 cmH20] 5 cmH20 Plateau Pressure:  [23 cmH20-25 cmH20] 23 cmH20   Intake/Output Summary (Last 24 hours) at 03/02/2024 0910 Last data filed at 03/02/2024 0907 Gross per 24 hour  Intake 1653.63 ml  Output 1370 ml  Net 283.63 ml   Filed Weights   02/29/24 1824 03/01/24 0500  Weight: 77.5 kg 75.6 kg    Physical Examination  GEN: Critically ill patient, intubated and sedated HEENT: Marie/AT. PERRL, sclerae anicteric. HEART: regular rhythm, normal rate, S1, S2, no M/R/G,  LUNGS: CTAB, diminished bilateral without wheezes, no increased WOB,  EXTREMITIES: No Edema, cap refill  NEURO: No gross focal deficits. PSYCH:  UTA ABDOMINAL: Soft: BS x 4, NTND SKIN: Intact, warm, no rashes lesion, or ulcer  RESULTS  Strep pneumoniae urinary antigen (not at [facility]) Order: [id number]  Status: Final result     Next appt: 06/09/2024 at 09:00 AM in Pulmonology ([facility])   Test Result Released: Yes (not seen)   0 Result Notes     Component Ref Range & Units (hover) 1 d ago  Strep Pneumo Urinary Antigen POSITIVE Abnormal          Assessment & Plan:   #Acute hypoxemic Hypercapnic Respiratory Failure  -strep pneumoniae induced AECOPD -c/w rocephin /steroids -lasix  20 x1 today Hx of Severe asthma/COPD overlap; hypercapnia, AMS; smoker on Arnuity, Anoro -PRVC vent: FiO? 35%, Vt 450 mL, RR 22, PEEP 5; titrate per ABG -IV methylprednisolone  -Neb ipratropium-albuterol  q4h + PRN q6h -propofol  + fentanyl , RASS -1 -PRN ABG &Xray -Nicotine  replacement therapy  Sepsis - due to pneumonia with strep pneumoniae   Present on admission     Continue rocephin  IV   #AMS septic encephalopathy due to pneumonia Multifactorial (hypercapnia, hypoxia, psych dz, ICU stress); CT head neg -Titrate sedation to RASS -1 -Daily neuro checks + delirium screen  #Seizure Disorder Hx seizures, no recent events -Continue lamotrigine  via tube -Seizure precautions -PRN benzo if breakthrough -Avoid tramadol/other seizure-lowering meds -Monitor metabolic/electrolyte derangements    #Hypertension #Hyperlipidemia Chronic, poorly controlled; ED BP 211/120 ? 128/89 -Treat per ICU protocol if persistent -Resume home anti-HTN when stable -resume rosuvastatin  once enteral access  #Psych (Anxiety/Depression/Bipolar) -Resume home citalopram , Atarax , Adderall PO -Psychiatry consult if agitation persists post-extubation  #Prediabetes / Glycemic Control HbA1c 6.4-6.7%; CBG 147-177 -Check glucose q4-6h -SSI  -Goal 140-180 mg/dL -Hold metformin  while NPO -Early enteral nutrition when feasible  #Polysubstance / Tobacco Use Active smoker, past polysubstance abuse -UDS +Amphetamine /on Adderall at home -Nicotine  replacement therapy -Counseling/support post-extubation -Avoid sedatives that worsen withdrawal  Best practice:  Diet:  NPO Pain/Anxiety/Delirium protocol (if indicated): Yes (RASS goal -1) VAP protocol (if indicated):  Yes DVT prophylaxis: LMWH GI prophylaxis: H2B Glucose control:  SSI No Central venous access:  N/A Arterial line:  N/A Foley:  N/A Mobility:  bed rest  PT/OT consulted: N/A Code Status:  full code Disposition: ICU   = Goals of Care =  Primary Emergency Contact: Zorn,John   Labs/imaging that I havepersonally reviewed  (right click and Reselect all SmartList Selections daily)   US  RENAL Result Date: 03/01/2024 EXAM: RETROPERITONEAL ULTRASOUND OF THE KIDNEYS 03/01/2024 01:31:34 PM TECHNIQUE: Real-time ultrasonography of the retroperitoneum, specifically the kidneys and urinary bladder, was performed. COMPARISON: None available. CLINICAL HISTORY: AKI (acute kidney injury). FINDINGS: RIGHT KIDNEY: Right kidney measures 10.8 x 4.4 x 4.8 cm. Right kidney volume equals 119 ml. Normal cortical echogenicity. No hydronephrosis. No calculus. No mass. LEFT KIDNEY: Left kidney measures 9.9 x 5.3 x 4.3 cm. Left kidney volume equals 113 ml. Normal cortical echogenicity. No hydronephrosis. No calculus. No mass. BLADDER: Bladder collapsed around foley catheter. IMPRESSION: 1. No sonographic abnormality of the kidneys. Electronically signed by: Norleen Boxer MD 03/01/2024 05:37 PM EST RP Workstation: [id number]   CT Head Wo Contrast Result Date: 02/29/2024 CLINICAL DATA:  Mental status change EXAM: CT HEAD WITHOUT CONTRAST TECHNIQUE: Contiguous axial images were obtained from the base of the skull through the vertex without intravenous contrast. RADIATION DOSE REDUCTION: This exam was performed according to the departmental dose-optimization program which includes automated exposure control, adjustment of the mA and/or kV according  to patient size and/or use of iterative reconstruction technique. COMPARISON:  CT 12/07/2019, MRI 02/20/2020 FINDINGS: Brain: No acute territorial infarction, hemorrhage or intracranial mass. The ventricles are nonenlarged Vascular: No hyperdense vessels.  No unexpected  calcification Skull: No fracture Sinuses/Orbits: No acute finding. Other: Incompletely visualized endotracheal and enteric tubes. IMPRESSION: Negative non contrasted CT appearance of the brain. Electronically Signed   By: Luke Bun M.D.   On: 02/29/2024 20:03   DG Abdomen 1 View Result Date: 02/29/2024 EXAM: 1 VIEW XRAY OF THE ABDOMEN 02/29/2024 06:59:11 PM COMPARISON: None available. CLINICAL HISTORY: POST INTUBATION FINDINGS: LINES, TUBES AND DEVICES: NG tube tip is in the stomach. BOWEL: Nonobstructive bowel gas pattern. SOFT TISSUES: No abnormal calcifications. BONES: No acute fracture. IMPRESSION: 1. Enteric tube tip terminates in the stomach. 2. Nonobstructive bowel gas pattern. Electronically signed by: Franky Crease MD 02/29/2024 07:23 PM EST RP Workstation: [id number]   DG Chest Portable 1 View Result Date: 02/29/2024 EXAM: 1 VIEW XRAY OF THE CHEST 02/29/2024 06:59:11 PM COMPARISON: None available. CLINICAL HISTORY: SOB SOB SOB FINDINGS: LINES, TUBES AND DEVICES: Endotracheal tube tip in the mid trachea. NG tube enters the stomach. LUNGS AND PLEURA: No confluent airspace opacities. No effusions. No pneumothorax. HEART AND MEDIASTINUM: No acute abnormality of the cardiac and mediastinal silhouettes. BONES AND SOFT TISSUES: No acute osseous abnormality. IMPRESSION: 1. No acute cardiopulmonary process. 2. Endotracheal tube tip in the mid trachea and enteric tube courses into the stomach. Electronically signed by: Franky Crease MD 02/29/2024 07:22 PM EST RP Workstation: [id number]     Labs   CBC: Recent Labs  Lab 02/29/24 1833 03/01/24 0201 03/02/24 0326  WBC 10.7* 6.0 11.6*  NEUTROABS 5.2  --   --   HGB 12.7 12.2 11.6*  HCT 40.9 38.6 37.7  MCV 94.9 92.6 95.4  PLT 313 286 292    Basic Metabolic Panel: Recent Labs  Lab 02/29/24 1833 03/01/24 0201 03/01/24 0614 03/01/24 1236 03/01/24 1726 03/01/24 2208 03/02/24 0326  NA 141 138 137  --   --  140 138  K 4.9 5.6* 5.7* 6.1* 5.6*  5.0 5.7*  CL 106 102 105  --   --  103 102  CO2 25 22 22   --   --  25 23  GLUCOSE 147* 189* 145*  --   --  137* 180*  BUN 14 19 22*  --   --  33* 33*  CREATININE 0.81 1.55* 1.76*  --   --  2.37* 2.26*  CALCIUM  8.9 9.1 9.1  --   --  9.0 9.0  MG  --  3.4*  --   --   --   --  3.2*  PHOS  --  3.7  --   --   --   --  5.9*   GFR: Estimated Creatinine Clearance: 28.2 mL/min (A) (by C-G formula based on SCr of 2.26 mg/dL (H)). Recent Labs  Lab 02/29/24 1833 02/29/24 1834 02/29/24 2018 03/01/24 0201 03/02/24 0326  PROCALCITON  --   --  <0.10  --   --   WBC 10.7*  --   --  6.0 11.6*  LATICACIDVEN  --  1.7 1.7  --   --     Liver Function Tests: Recent Labs  Lab 02/29/24 1833  AST 23  ALT 20  ALKPHOS 90  BILITOT <0.2  PROT 7.1  ALBUMIN 4.6   No results for input(s): LIPASE, AMYLASE in the last 168 hours. No results for input(s): AMMONIA in  the last 168 hours.  ABG    Component Value Date/Time   PHART 7.34 (L) 03/01/2024 0432   PCO2ART 47 03/01/2024 0432   PO2ART 92 03/01/2024 0432   HCO3 25.4 03/01/2024 0432   ACIDBASEDEF 0.8 03/01/2024 0432   O2SAT 98.6 03/01/2024 0432     Coagulation Profile: No results for input(s): INR, PROTIME in the last 168 hours.  Cardiac Enzymes: No results for input(s): CKTOTAL, CKMB, CKMBINDEX, TROPONINI in the last 168 hours.  HbA1C: Hemoglobin A1C  Date/Time Value Ref Range Status  09/21/2021 02:00 PM 6.4 (A) 4.0 - 5.6 % Final  03/22/2021 09:52 AM 6.2 (A) 4.0 - 5.6 % Final  01/15/2014 12:00 AM 6.8  Final   HbA1c, POC (prediabetic range)  Date/Time Value Ref Range Status  05/11/2020 10:30 AM 0 (A) 5.7 - 6.4 % Final   HbA1c, POC (controlled diabetic range)  Date/Time Value Ref Range Status  05/11/2020 10:30 AM 0.0 0.0 - 7.0 % Final   HbA1c POC (<> result, manual entry)  Date/Time Value Ref Range Status  05/11/2020 10:30 AM 6.5 4.0 - 5.6 % Final   Hgb A1c MFr Bld  Date/Time Value Ref Range Status  03/01/2024  02:02 AM 6.4 (H) 4.8 - 5.6 % Final    Comment:    (NOTE) Diagnosis of Diabetes The following HbA1c ranges recommended by the American Diabetes Association (ADA) may be used as an aid in the diagnosis of diabetes mellitus.  Hemoglobin             Suggested A1C NGSP%              Diagnosis  <5.7                   Non Diabetic  5.7-6.4                Pre-Diabetic  >6.4                   Diabetic  <7.0                   Glycemic control for                       adults with diabetes.    12/24/2019 11:08 AM 6.7 (H) 4.8 - 5.6 % Final    Comment:             Prediabetes: 5.7 - 6.4          Diabetes: >6.4          Glycemic control for adults with diabetes: <7.0    CBG: Recent Labs  Lab 03/01/24 1616 03/01/24 1919 03/01/24 2316 03/02/24 0321 03/02/24 0744  GLUCAP 115* 137* 115* 156* 170*    Review of Systems:   Unable to be obtained secondary to the patient's intubated and sedated status.   Past Medical History  She,  has a past medical history of Allergy, Anxiety, Asthma, Bilateral carpal tunnel syndrome (12/31/2018), Bipolar affective, manic (HCC) (1996), COPD (chronic obstructive pulmonary disease) (HCC), Depression, HLD (hyperlipidemia) (03/12/2018), Lung nodule, multiple (09/26/2016), Manic depressive illness (HCC) (1996), Seizures (HCC), Substance abuse (HCC), and Tobacco abuse (10/04/2016).   Surgical History    Past Surgical History:  Procedure Laterality Date   ABDOMINAL HYSTERECTOMY     CARPAL TUNNEL RELEASE Right 01/15/2019   Procedure: RIGHTCARPAL TUNNEL RELEASE, LEFT DEQUERVAINS INJECTION;  Surgeon: Jerri Kay HERO, MD;  Location: [facility];  Service: Orthopedics;  Laterality: Right;   CARPAL TUNNEL RELEASE Left 02/26/2019   Procedure: LEFT CARPAL TUNNEL RELEASE;  Surgeon: Jerri Kay HERO, MD;  Location: [facility];  Service: Orthopedics;  Laterality: Left;  Bier block   COLONOSCOPY WITH PROPOFOL  N/A 11/28/2018   Procedure: COLONOSCOPY  WITH PROPOFOL ;  Surgeon: Therisa Bi, MD;  Location: [facility];  Service: Gastroenterology;  Laterality: N/A;   TUBAL LIGATION       Social History   reports that she has been smoking cigarettes. Her smokeless tobacco use includes chew and snuff. She reports that she does not currently use alcohol. She reports that she does not use drugs.   Family History   Her family history includes Bipolar disorder in her brother and brother; CAD in her father; Heart attack in her father; Other in her mother; Schizophrenia in her brother and brother; Stroke in her mother.   Allergies Allergies[1]   Home Medications  Prior to Admission medications  Medication Sig Start Date End Date Taking? Authorizing Provider  albuterol  (PROVENTIL  HFA) 108 (90 Base) MCG/ACT inhaler Inhale 2 puffs into the lungs every 4-6 hours as needed. 09/21/21   Iloabachie, Chioma E, NP  amphetamine-dextroamphetamine (ADDERALL XR) 10 MG 24 hr capsule Take 10 mg by mouth daily.    [provider]  citalopram  (CELEXA ) 40 MG tablet Take 1 tablet (40 mg total) by mouth daily. 11/21/22     Fluticasone  Furoate (ARNUITY ELLIPTA ) 200 MCG/ACT AEPB Inhale 1 puff into the lungs daily. 12/06/23   Isadora Hose, MD  hydrOXYzine  (ATARAX ) 50 MG tablet Take 1-1.5 tablets (50-75 mg total) by mouth at bedtime as needed for sleep 11/21/22     ipratropium-albuterol  (DUONEB) 0.5-2.5 (3) MG/3ML SOLN INHALE CONTENTS OF ONE VIAL ( TOTAL) USING NEBULIZER  ONCE EVERY 6 HOURS. 11/02/20 12/06/23  Iloabachie, Chioma E, NP  lamoTRIgine  (LAMICTAL ) 100 MG tablet Take 1 tablet (100 mg total) by mouth 2 (two) times daily. 06/23/20   Gayland Lauraine PARAS, NP  lamoTRIgine  (LAMICTAL ) 100 MG tablet Take 1 tablet (100 mg total) by mouth daily. 11/21/22     meloxicam  (MOBIC ) 15 MG tablet Take 15 mg by mouth daily.    [provider]  metFORMIN  (GLUCOPHAGE ) 500 MG tablet TAKE ONE TABLET BY MOUTH TWICE DAILY WITH MEALS. 09/21/21   Iloabachie, Chioma E, NP   methocarbamol  (ROBAXIN ) 500 MG tablet Take 500 mg by mouth at bedtime.    [provider]  nicotine  polacrilex (NICOTINE  MINI) 2 MG lozenge Take 1 lozenge (2 mg total) by mouth every 2 (two) hours as needed for smoking cessation. 12/06/23 03/05/24  Isadora Hose, MD  rosuvastatin  (CRESTOR ) 10 MG tablet Take 10 mg by mouth at bedtime. 08/13/23   [provider]  traMADol (ULTRAM) 50 MG tablet Take 1 tablet by mouth every 6 (six) hours as needed.    [provider]  triamcinolone  cream (KENALOG ) 0.1 % Apply 1 Application topically 2 (two) times daily. 09/21/21   Iloabachie, Chioma E, NP  umeclidinium-vilanterol (ANORO ELLIPTA ) 62.5-25 MCG/ACT AEPB Inhale 1 puff into the lungs daily. 12/06/23   Isadora Hose, MD  atomoxetine  (STRATTERA ) 25 MG capsule Take 1 capsule (25 mg total) by mouth once every evening after dinner. Patient not taking: Reported on 12/23/2020 10/26/20 03/09/21    loratadine  (CLARITIN ) 10 MG tablet Take 1 tablet (10 mg total) by mouth daily as needed for allergies. 12/16/19 12/25/19  Iloabachie, Chioma E, NP   Scheduled Meds:  Chlorhexidine  Gluconate Cloth  6 each Topical  Daily   citalopram   40 mg Per Tube Daily   enoxaparin  (LOVENOX ) injection  30 mg Subcutaneous Q24H   insulin  aspart  0-15 Units Subcutaneous Q4H   ipratropium-albuterol   3 mL Nebulization Q4H   lamoTRIgine   100 mg Per Tube Daily   methylPREDNISolone  (SOLU-MEDROL ) injection  40 mg Intravenous Q12H   mouth rinse  15 mL Mouth Rinse Q2H   Continuous Infusions:  sodium chloride  5 mL/hr at 03/02/24 0900   cefTRIAXone  (ROCEPHIN )  IV Stopped (03/02/24 0306)   fentaNYL  infusion INTRAVENOUS 150 mcg/hr (03/02/24 0900)   ketamine  (KETALAR ) adult infusion Stopped (03/01/24 2121)   norepinephrine  (LEVOPHED ) Adult infusion 2 mcg/min (03/02/24 0900)   propofol  (DIPRIVAN ) infusion 70 mcg/kg/min (03/02/24 0900)   PRN Meds:.sodium chloride , acetaminophen , albuterol , fentaNYL , lip balm, midazolam  PF,  mouth rinse, polyethylene glycol, senna  Critical care provider statement:   Total critical care time: 33 minutes   Performed by: Parris MD   Critical care time was exclusive of separately billable procedures and treating other patients.   Critical care was necessary to treat or prevent imminent or life-threatening deterioration.   Critical care was time spent personally by me on the following activities: development of treatment plan with patient and/or surrogate as well as nursing, discussions with consultants, evaluation of patient's response to treatment, examination of patient, obtaining history from patient or surrogate, ordering and performing treatments and interventions, ordering and review of laboratory studies, ordering and review of radiographic studies, pulse oximetry and re-evaluation of patient's condition.    Marquiz Sotelo, M.D.  Pulmonary & Critical Care Medicine           [1]  Allergies Allergen Reactions   Flax Seed Oil [Flax Seed Oil] Hives    Pt. Self reported   Other Hives    Pt. Self reported   Trazodone Other (See Comments)    Bed wetting  Other Reaction(s): Other (See Comments)    Bed wetting   "

## 2024-03-02 NOTE — Progress Notes (Signed)
 " [facility] CONSULT NOTE    Date: 03/02/2024                  Patient Name:  Erica Gomez  MRN: [id number]  DOB: 1966/01/03  Age / Sex: 60 y.o., female         PCP: [facility] [employer] [facility]                 Service Requesting Consult: Medicine History obtained from patient's husband.                 Reason for Consult: Acute kidney injury             History of Present Illness: Patient is a 59 y.o. female with a PMHx of COPD, asthma, coronary artery disease, seizure disorder, bipolar disorder, history of drug use now admitted with history of difficulty breathing and wheezing and was intubated in the emergency room for respiratory failure.  She is now found to have acute kidney injury.  She had a renal sonogram which was negative.  As per the patient's spouse she never had any issues with her kidneys.  Denies any use of nonsteroidal anti-inflammatory drugs.   Medications: Outpatient medications: Medications Prior to Admission  Medication Sig Dispense Refill Last Dose/Taking   albuterol  (PROVENTIL  HFA) 108 (90 Base) MCG/ACT inhaler Inhale 2 puffs into the lungs every 4-6 hours as needed. 20.1 g 1 Unknown   amphetamine-dextroamphetamine (ADDERALL XR) 10 MG 24 hr capsule Take 10 mg by mouth daily.   Past Week   citalopram  (CELEXA ) 40 MG tablet Take 1 tablet (40 mg total) by mouth daily. 30 tablet 1 Past Week   Fluticasone  Furoate (ARNUITY ELLIPTA ) 200 MCG/ACT AEPB Inhale 1 puff into the lungs daily. 30 each 12 Past Week   hydrOXYzine  (ATARAX ) 50 MG tablet Take 1-1.5 tablets (50-75 mg total) by mouth at bedtime as needed for sleep 45 tablet 1 Past Week   lamoTRIgine  (LAMICTAL ) 100 MG tablet Take 1 tablet (100 mg total) by mouth 2 (two) times daily. 180 tablet 4 Past Week   lamoTRIgine  (LAMICTAL ) 100 MG tablet Take 1 tablet (100 mg total) by mouth daily. 30 tablet 1 Past Week   nicotine  polacrilex (NICOTINE  MINI) 2 MG lozenge Take 1 lozenge (2 mg total)  by mouth every 2 (two) hours as needed for smoking cessation. 72 lozenge 3 Unknown   rosuvastatin  (CRESTOR ) 10 MG tablet Take 10 mg by mouth at bedtime.   Unknown   traMADol (ULTRAM) 50 MG tablet Take 1 tablet by mouth every 6 (six) hours as needed.   Unknown   umeclidinium-vilanterol (ANORO ELLIPTA ) 62.5-25 MCG/ACT AEPB Inhale 1 puff into the lungs daily. 60 each 12 Past Week   ipratropium-albuterol  (DUONEB) 0.5-2.5 (3) MG/3ML SOLN INHALE CONTENTS OF ONE VIAL ( TOTAL) USING NEBULIZER  ONCE EVERY 6 HOURS. 360 mL 2    meloxicam  (MOBIC ) 15 MG tablet Take 15 mg by mouth daily. (Patient not taking: Reported on 02/29/2024)   Not Taking   metFORMIN  (GLUCOPHAGE ) 500 MG tablet TAKE ONE TABLET BY MOUTH TWICE DAILY WITH MEALS. (Patient not taking: Reported on 02/29/2024) 120 tablet 1 Not Taking   methocarbamol  (ROBAXIN ) 500 MG tablet Take 500 mg by mouth at bedtime. (Patient not taking: Reported on 02/29/2024)   Not Taking   triamcinolone  cream (KENALOG ) 0.1 % Apply 1 Application topically 2 (two) times daily. (Patient not taking: Reported on 02/29/2024) 45 g 0 Not Taking    Discontinued  Meds:   Medications Discontinued During This Encounter  Medication Reason   enoxaparin  (LOVENOX ) injection 30 mg    ipratropium-albuterol  (DUONEB) 0.5-2.5 (3) MG/3ML nebulizer solution 3 mL    lamoTRIgine  (LAMICTAL ) tablet 100 mg    sodium chloride  0.9 % bolus 500 mL    methylPREDNISolone  sodium succinate (SOLU-MEDROL ) 40 mg/mL injection 40 mg    doxycycline  (VIBRAMYCIN ) 100 mg in sodium chloride  0.9 % 250 mL IVPB    ipratropium-albuterol  (DUONEB) 0.5-2.5 (3) MG/3ML nebulizer solution 3 mL    propofol  (DIPRIVAN ) 1000 MG/100ML infusion    vecuronium  (NORCURON ) injection 10 mg    enoxaparin  (LOVENOX ) injection 40 mg     Current medications: Current Facility-Administered Medications  Medication Dose Route Frequency Provider Last Rate Last Admin   0.9 %  sodium chloride  infusion   Intravenous PRN Aleskerov, Fuad, MD 5  mL/hr at 03/02/24 1300 Infusion Verify at 03/02/24 1300   acetaminophen  (TYLENOL ) tablet 650 mg  650 mg Per Tube Q6H PRN Nelson, Dana G, NP   650 mg at 03/01/24 1802   albuterol  (PROVENTIL ) (2.5 MG/3ML) 0.083% nebulizer solution 2.5 mg  2.5 mg Nebulization Q2H PRN Aleskerov, Fuad, MD   2.5 mg at 03/02/24 0301   cefTRIAXone  (ROCEPHIN ) 1 g in sodium chloride  0.9 % 100 mL IVPB  1 g Intravenous Q24H Kathrene Almarie Bake, NP   Stopped at 03/02/24 9693   Chlorhexidine  Gluconate Cloth 2 % PADS 6 each  6 each Topical Daily Kathrene Almarie Bake, NP   6 each at 03/01/24 2225   citalopram  (CELEXA ) tablet 40 mg  40 mg Per Tube Daily Ouma, Elizabeth Achieng, NP   40 mg at 03/02/24 1315   enoxaparin  (LOVENOX ) injection 30 mg  30 mg Subcutaneous Q24H Hallaji, Sheema M, RPH       fentaNYL  (SUBLIMAZE ) bolus via infusion 25-100 mcg  25-100 mcg Intravenous Q15 min PRN [name], MD   100 mcg at 03/02/24 0228   fentaNYL  in NS (10mcg/ml) infusion-PREMIX  0-400 mcg/hr Intravenous Continuous [name], MD 15 mL/hr at 03/02/24 1300 150 mcg/hr at 03/02/24 1300   insulin  aspart (novoLOG ) injection 0-15 Units  0-15 Units Subcutaneous Q4H Kathrene Almarie Bake, NP   3 Units at 03/02/24 9166   ipratropium-albuterol  (DUONEB) 0.5-2.5 (3) MG/3ML nebulizer solution 3 mL  3 mL Nebulization Q4H Kathrene Almarie Bake, NP   3 mL at 03/02/24 1200   ketamine  (KETALAR ) adult IV infusion 1000mg /156mL (10 mg/mL-Premix)  3 mg/kg/hr Intravenous Continuous Aleskerov, Fuad, MD   Stopped at 03/01/24 2121   lamoTRIgine  (LAMICTAL ) tablet 100 mg  100 mg Per Tube Daily Kathrene Almarie Bake, NP   100 mg at 03/02/24 1000   lip balm (CARMEX) ointment 1 Application  1 Application Topical PRN Nelson, Dana G, NP       methylPREDNISolone  sodium succinate (SOLU-MEDROL ) 40 mg/mL injection 40 mg  40 mg Intravenous Q12H Aleskerov, Fuad, MD   40 mg at 03/02/24 9161   midazolam  PF (VERSED ) injection 1-2 mg  1-2 mg Intravenous  Q1H PRN Jessup, Charles, MD   2 mg at 03/02/24 0224   norepinephrine  (LEVOPHED ) 4mg  in (0.016 mg/mL) premix infusion  0-10 mcg/min Intravenous Titrated Ouma, Elizabeth Achieng, NP 3.75 mL/hr at 03/02/24 1300 1 mcg/min at 03/02/24 1300   Oral care mouth rinse  15 mL Mouth Rinse Q2H Kathrene Almarie Bake, NP   15 mL at 03/02/24 1200   Oral care mouth rinse  15 mL Mouth Rinse PRN Kathrene Almarie Bake, NP  polyethylene glycol (MIRALAX  / GLYCOLAX ) packet 17 g  17 g Oral Daily PRN Kathrene Almarie Bake, NP   17 g at 03/02/24 1322   propofol  (DIPRIVAN ) 1000 MG/100ML infusion  0-80 mcg/kg/min Intravenous Titrated Ouma, Elizabeth Achieng, NP 31.8 mL/hr at 03/02/24 1359 70 mcg/kg/min at 03/02/24 1359   senna (SENOKOT) tablet 8.6 mg  1 tablet Oral BID PRN Kathrene Almarie Bake, NP   8.6 mg at 03/02/24 1323      Allergies: Allergies[1]    Past Medical History: Past Medical History:  Diagnosis Date   Allergy    Anxiety    Asthma    Bilateral carpal tunnel syndrome 12/31/2018   Bipolar affective, manic (HCC) 1996   COPD (chronic obstructive pulmonary disease) (HCC)    Depression    HLD (hyperlipidemia) 03/12/2018   Lung nodule, multiple 09/26/2016   Manic depressive illness (HCC) 1996   Seizures (HCC)    Substance abuse (HCC)    Tobacco abuse 10/04/2016     Past Surgical History: Past Surgical History:  Procedure Laterality Date   ABDOMINAL HYSTERECTOMY     CARPAL TUNNEL RELEASE Right 01/15/2019   Procedure: RIGHTCARPAL TUNNEL RELEASE, LEFT DEQUERVAINS INJECTION;  Surgeon: Jerri Kay HERO, MD;  Location: [facility];  Service: Orthopedics;  Laterality: Right;   CARPAL TUNNEL RELEASE Left 02/26/2019   Procedure: LEFT CARPAL TUNNEL RELEASE;  Surgeon: Jerri Kay HERO, MD;  Location: [facility];  Service: Orthopedics;  Laterality: Left;  Bier block   COLONOSCOPY WITH PROPOFOL  N/A 11/28/2018   Procedure: COLONOSCOPY WITH PROPOFOL ;  Surgeon: Therisa Bi,  MD;  Location: [facility];  Service: Gastroenterology;  Laterality: N/A;   TUBAL LIGATION       Family History: Family History  Problem Relation Age of Onset   Stroke Mother    Other Mother        blood clot   Heart attack Father    CAD Father    Bipolar disorder Brother    Schizophrenia Brother    Schizophrenia Brother    Bipolar disorder Brother      Social History: Social History   Socioeconomic History   Marital status: Married    Spouse name: Not on file   Number of children: 2   Years of education: taking some medical billing classes now   Highest education level: Not on file  Occupational History   Occupation: Caretaker  Tobacco Use   Smoking status: Every Day    Current packs/day: 0.50    Types: Cigarettes   Smokeless tobacco: Current    Types: Chew, Snuff    Last attempt to quit: 2018   Tobacco comments:    Smokes 0.5 PPD- khj 12/06/2023        Using nicotine  pouch to try and help her quit smoking.   Vaping Use   Vaping status: Former   Quit date: 08/11/2016  Substance and Sexual Activity   Alcohol use: Not Currently    Comment: rare   Drug use: No    Types: Crack cocaine    Comment: history 2013   Sexual activity: Yes    Birth control/protection: Condom, None  Other Topics Concern   Not on file  Social History Narrative   Lives with her husband.   Right-handed.   Caffeine use: 2 cups daily.   Social Drivers of Health   Tobacco Use: High Risk (02/29/2024)   Patient History    Smoking Tobacco Use: Every Day    Smokeless Tobacco Use:  Current    Passive Exposure: Not on file  Financial Resource Strain: Not on file  Food Insecurity: Patient Unable To Answer (02/29/2024)   Epic    Worried About Programme Researcher, Broadcasting/film/video in the Last Year: Patient unable to answer    Ran Out of Food in the Last Year: Patient unable to answer  Transportation Needs: Patient Unable To Answer (02/29/2024)   Epic    Lack of Transportation (Medical): Patient unable  to answer    Lack of Transportation (Non-Medical): Patient unable to answer  Physical Activity: Not on file  Stress: Not on file  Social Connections: Not on file  Intimate Partner Violence: Patient Unable To Answer (02/29/2024)   Epic    Fear of Current or Ex-Partner: Patient unable to answer    Emotionally Abused: Patient unable to answer    Physically Abused: Patient unable to answer    Sexually Abused: Patient unable to answer  Depression (PHQ2-9): Not on file  Alcohol Screen: Not on file  Housing: Patient Unable To Answer (02/29/2024)   Epic    Unable to Pay for Housing in the Last Year: Patient unable to answer    Number of Times Moved in the Last Year: Not on file    Homeless in the Last Year: Patient unable to answer  Utilities: Patient Unable To Answer (02/29/2024)   Epic    Threatened with loss of utilities: Patient unable to answer  Health Literacy: Not on file     Review of Systems: As per HPI  Vital Signs: Blood pressure 102/60, pulse 94, temperature 99.3 F (37.4 C), resp. rate (!) 22, height 5' 6 (1.676 m), weight 75.6 kg, SpO2 95%.  Weight trends: Filed Weights   02/29/24 1824 03/01/24 0500  Weight: 77.5 kg 75.6 kg    Physical Exam: Physical Exam: General:  No acute distress  Head:  Normocephalic, atraumatic. Moist oral mucosal membranes  Eyes:  Anicteric  Neck:  Supple  Lungs:   Clear to auscultation, normal effort  Heart:  S1S2 no rubs  Abdomen:   Soft, nontender, bowel sounds present  Extremities:  peripheral edema.  Neurologic:  Awake, alert, following commands  Skin:  No lesions  Access:     Lab results:  Basic Metabolic Panel: Recent Labs  Lab 03/01/24 0201 03/01/24 0614 03/01/24 1236 03/01/24 1726 03/01/24 2208 03/02/24 0326 03/02/24 1252  NA 138 137  --   --  140 138 138  K 5.6* 5.7* 6.1* 5.6* 5.0 5.7* 5.4*  CL 102 105  --   --  103 102 104  CO2 22 22  --   --  25 23 26   GLUCOSE 189* 145*  --   --  137* 180* 127*  BUN 19 22*   --   --  33* 33* 29*  CREATININE 1.55* 1.76*  --   --  2.37* 2.26* 1.55*  CALCIUM  9.1 9.1  --   --  9.0 9.0 9.4  MG 3.4*  --   --   --   --  3.2*  --   PHOS 3.7  --   --   --   --  5.9*  --     Creatinine  Date/Time Value Ref Range Status  01/15/2014 12:00 AM 1.1 0.5 - 1.1 mg/dL Final  90/69/7984 94:66 PM 1.08 0.60 - 1.30 mg/dL Final   Creatinine, Ser  Date/Time Value Ref Range Status  03/02/2024 12:52 PM 1.55 (H) 0.44 - 1.00 mg/dL Final  98/81/7973 96:73  AM 2.26 (H) 0.44 - 1.00 mg/dL Final  98/82/7973 89:91 PM 2.37 (H) 0.44 - 1.00 mg/dL Final  98/82/7973 93:85 AM 1.76 (H) 0.44 - 1.00 mg/dL Final  98/82/7973 97:98 AM 1.55 (H) 0.44 - 1.00 mg/dL Final  98/83/7973 93:66 PM 0.81 0.44 - 1.00 mg/dL Final  89/76/7975 89:77 AM 1.08 (H) 0.57 - 1.00 mg/dL Final  96/69/7977 89:75 AM 1.03 (H) 0.57 - 1.00 mg/dL Final  89/75/7978 89:87 PM 1.17 (H) 0.44 - 1.00 mg/dL Final  94/80/7978 90:46 AM 1.09 (H) 0.57 - 1.00 mg/dL Final  97/82/7978 90:45 AM 1.05 (H) 0.57 - 1.00 mg/dL Final  98/91/7978 87:69 PM 1.20 (H) 0.44 - 1.00 mg/dL Final  88/95/7979 90:60 AM 1.08 (H) 0.57 - 1.00 mg/dL Final  90/69/7979 89:71 AM 1.11 (H) 0.57 - 1.00 mg/dL Final  98/69/7979 96:73 AM 0.96 0.44 - 1.00 mg/dL Final  98/70/7979 94:99 AM 0.79 0.44 - 1.00 mg/dL Final  98/71/7979 94:49 AM 1.01 (H) 0.44 - 1.00 mg/dL Final  98/72/7979 88:99 PM 0.89 0.44 - 1.00 mg/dL Final  98/73/7979 91:99 AM 0.94 0.44 - 1.00 mg/dL Final  91/85/7981 92:87 PM 1.26 (H) 0.57 - 1.00 mg/dL Final  96/70/7981 92:82 PM 1.12 (H) 0.57 - 1.00 mg/dL Final    CBC: Recent Labs  Lab 02/29/24 1833 03/01/24 0201 03/02/24 0326  WBC 10.7* 6.0 11.6*  NEUTROABS 5.2  --   --   HGB 12.7 12.2 11.6*  HCT 40.9 38.6 37.7  MCV 94.9 92.6 95.4  PLT 313 286 292    Microbiology: Results for orders placed or performed during the hospital encounter of 02/29/24  Culture, blood (routine x 2)     Status: None (Preliminary result)   Collection Time: 02/29/24  6:44 PM    Specimen: BLOOD  Result Value Ref Range Status   Specimen Description BLOOD LEFT ANTECUBITAL  Final   Special Requests   Final    BOTTLES DRAWN AEROBIC AND ANAEROBIC Blood Culture adequate volume   Culture   Final    NO GROWTH 2 DAYS Performed at [facility], [address], [place], [place] [address]    Report Status PENDING  Incomplete  Culture, blood (routine x 2)     Status: None (Preliminary result)   Collection Time: 02/29/24  8:18 PM   Specimen: BLOOD  Result Value Ref Range Status   Specimen Description BLOOD RIGHT ANTECUBITAL  Final   Special Requests   Final    BOTTLES DRAWN AEROBIC AND ANAEROBIC Blood Culture results may not be optimal due to an inadequate volume of blood received in culture bottles   Culture   Final    NO GROWTH 2 DAYS Performed at [facility], [address] [place], [place] [address]    Report Status PENDING  Incomplete  Respiratory (~20 pathogens) panel by PCR     Status: None   Collection Time: 02/29/24  8:18 PM   Specimen: Urine, Catheterized; Respiratory  Result Value Ref Range Status   Adenovirus NOT DETECTED NOT DETECTED Final   Coronavirus 229E NOT DETECTED NOT DETECTED Final    Comment: (NOTE) The Coronavirus on the Respiratory Panel, DOES NOT test for the novel  Coronavirus (2019 nCoV)    Coronavirus HKU1 NOT DETECTED NOT DETECTED Final   Coronavirus NL63 NOT DETECTED NOT DETECTED Final   Coronavirus OC43 NOT DETECTED NOT DETECTED Final   Metapneumovirus NOT DETECTED NOT DETECTED Final   Rhinovirus / Enterovirus NOT DETECTED NOT DETECTED Final   Influenza A NOT DETECTED  NOT DETECTED Final   Influenza B NOT DETECTED NOT DETECTED Final   Parainfluenza Virus 1 NOT DETECTED NOT DETECTED Final   Parainfluenza Virus 2 NOT DETECTED NOT DETECTED Final   Parainfluenza Virus 3 NOT DETECTED NOT DETECTED Final   Parainfluenza Virus 4 NOT DETECTED NOT DETECTED Final   Respiratory Syncytial Virus NOT DETECTED NOT DETECTED  Final   Bordetella pertussis NOT DETECTED NOT DETECTED Final   Bordetella Parapertussis NOT DETECTED NOT DETECTED Final   Chlamydophila pneumoniae NOT DETECTED NOT DETECTED Final   Mycoplasma pneumoniae NOT DETECTED NOT DETECTED Final    Comment: Performed at [facility], [address] [place], [place] [address]  Resp panel by RT-PCR (RSV, Flu A&B, Covid) Urine, Catheterized     Status: None   Collection Time: 02/29/24  8:18 PM   Specimen: Urine, Catheterized; Nasal Swab  Result Value Ref Range Status   SARS Coronavirus 2 by RT PCR NEGATIVE NEGATIVE Final    Comment: (NOTE) SARS-CoV-2 target nucleic acids are NOT DETECTED.  The SARS-CoV-2 RNA is generally detectable in upper respiratory specimens during the acute phase of infection. The lowest concentration of SARS-CoV-2 viral copies this assay can detect is 138 copies/mL. A negative result does not preclude SARS-Cov-2 infection and should not be used as the sole basis for treatment or other patient management decisions. A negative result may occur with  improper specimen collection/handling, submission of specimen other than nasopharyngeal swab, presence of viral mutation(s) within the areas targeted by this assay, and inadequate number of viral copies(<138 copies/mL). A negative result must be combined with clinical observations, patient history, and epidemiological information. The expected result is Negative.  Fact Sheet for Patients:  bloggercourse.com  Fact Sheet for Healthcare Providers:  seriousbroker.it  This test is no t yet approved or cleared by the [place]  FDA and  has been authorized for detection and/or diagnosis of SARS-CoV-2 by FDA under an Emergency Use Authorization (EUA). This EUA will remain  in effect (meaning this test can be used) for the duration of the COVID-19 declaration under Section 564(b)(1) of the Act, 21 U.S.C.section 360bbb-3(b)(1),  unless the authorization is terminated  or revoked sooner.       Influenza A by PCR NEGATIVE NEGATIVE Final   Influenza B by PCR NEGATIVE NEGATIVE Final    Comment: (NOTE) The Xpert Xpress SARS-CoV-2/FLU/RSV plus assay is intended as an aid in the diagnosis of influenza from Nasopharyngeal swab specimens and should not be used as a sole basis for treatment. Nasal washings and aspirates are unacceptable for Xpert Xpress SARS-CoV-2/FLU/RSV testing.  Fact Sheet for Patients: bloggercourse.com  Fact Sheet for Healthcare Providers: seriousbroker.it  This test is not yet approved or cleared by the [place]  FDA and has been authorized for detection and/or diagnosis of SARS-CoV-2 by FDA under an Emergency Use Authorization (EUA). This EUA will remain in effect (meaning this test can be used) for the duration of the COVID-19 declaration under Section 564(b)(1) of the Act, 21 U.S.C. section 360bbb-3(b)(1), unless the authorization is terminated or revoked.     Resp Syncytial Virus by PCR NEGATIVE NEGATIVE Final    Comment: (NOTE) Fact Sheet for Patients: bloggercourse.com  Fact Sheet for Healthcare Providers: seriousbroker.it  This test is not yet approved or cleared by the [place]  FDA and has been authorized for detection and/or diagnosis of SARS-CoV-2 by FDA under an Emergency Use Authorization (EUA). This EUA will remain in effect (meaning this test can be used) for the duration  of the COVID-19 declaration under Section 564(b)(1) of the Act, 21 U.S.C. section 360bbb-3(b)(1), unless the authorization is terminated or revoked.  Performed at [facility], [address] [place], [place] [address]   MRSA Next Gen by PCR, Nasal     Status: None   Collection Time: 02/29/24  8:18 PM   Specimen: Urine, Catheterized; Nasal Swab  Result Value Ref Range Status    MRSA by PCR Next Gen NOT DETECTED NOT DETECTED Final    Comment: (NOTE) The GeneXpert MRSA Assay (FDA approved for NASAL specimens only), is one component of a comprehensive MRSA colonization surveillance program. It is not intended to diagnose MRSA infection nor to guide or monitor treatment for MRSA infections. Test performance is not FDA approved in patients less than 45 years old. Performed at [facility], [address] [place], [place] [address]   Culture, Respiratory w Gram Stain     Status: None (Preliminary result)   Collection Time: 03/01/24  1:54 PM   Specimen: Tracheal Aspirate; Respiratory  Result Value Ref Range Status   Specimen Description   Final    TRACHEAL ASPIRATE Performed at [facility], [address] [place], [place] [address]    Special Requests   Final    NONE Performed at [facility], [address] [place], [place] [address]    Gram Stain   Final    FEW WBC PRESENT, PREDOMINANTLY PMN FEW GRAM POSITIVE COCCI Performed at [facility], [address] [place], [place] [address]    Culture PENDING  Incomplete   Report Status PENDING  Incomplete    Urinalysis: Recent Labs    02/29/24 2018  COLORURINE STRAW*  LABSPEC 1.019  PHURINE 5.0  GLUCOSEU NEGATIVE  HGBUR NEGATIVE  BILIRUBINUR NEGATIVE  KETONESUR NEGATIVE  PROTEINUR 30*  NITRITE NEGATIVE  LEUKOCYTESUR NEGATIVE     Imaging:  US  RENAL Result Date: 03/01/2024 EXAM: RETROPERITONEAL ULTRASOUND OF THE KIDNEYS 03/01/2024 01:31:34 PM TECHNIQUE: Real-time ultrasonography of the retroperitoneum, specifically the kidneys and urinary bladder, was performed. COMPARISON: None available. CLINICAL HISTORY: AKI (acute kidney injury). FINDINGS: RIGHT KIDNEY: Right kidney measures 10.8 x 4.4 x 4.8 cm. Right kidney volume equals 119 ml. Normal cortical echogenicity. No hydronephrosis. No calculus. No mass. LEFT KIDNEY: Left kidney measures 9.9 x 5.3 x 4.3 cm. Left  kidney volume equals 113 ml. Normal cortical echogenicity. No hydronephrosis. No calculus. No mass. BLADDER: Bladder collapsed around foley catheter. IMPRESSION: 1. No sonographic abnormality of the kidneys. Electronically signed by: Norleen Boxer MD 03/01/2024 05:37 PM EST RP Workstation: [id number]   CT Head Wo Contrast Result Date: 02/29/2024 CLINICAL DATA:  Mental status change EXAM: CT HEAD WITHOUT CONTRAST TECHNIQUE: Contiguous axial images were obtained from the base of the skull through the vertex without intravenous contrast. RADIATION DOSE REDUCTION: This exam was performed according to the departmental dose-optimization program which includes automated exposure control, adjustment of the mA and/or kV according to patient size and/or use of iterative reconstruction technique. COMPARISON:  CT 12/07/2019, MRI 02/20/2020 FINDINGS: Brain: No acute territorial infarction, hemorrhage or intracranial mass. The ventricles are nonenlarged Vascular: No hyperdense vessels.  No unexpected calcification Skull: No fracture Sinuses/Orbits: No acute finding. Other: Incompletely visualized endotracheal and enteric tubes. IMPRESSION: Negative non contrasted CT appearance of the brain. Electronically Signed   By: Luke Bun M.D.   On: 02/29/2024 20:03   DG Abdomen 1 View Result Date: 02/29/2024 EXAM: 1 VIEW XRAY OF THE ABDOMEN 02/29/2024 06:59:11 PM  COMPARISON: None available. CLINICAL HISTORY: POST INTUBATION FINDINGS: LINES, TUBES AND DEVICES: NG tube tip is in the stomach. BOWEL: Nonobstructive bowel gas pattern. SOFT TISSUES: No abnormal calcifications. BONES: No acute fracture. IMPRESSION: 1. Enteric tube tip terminates in the stomach. 2. Nonobstructive bowel gas pattern. Electronically signed by: Franky Crease MD 02/29/2024 07:23 PM EST RP Workstation: [id number]   DG Chest Portable 1 View Result Date: 02/29/2024 EXAM: 1 VIEW XRAY OF THE CHEST 02/29/2024 06:59:11 PM COMPARISON: None available. CLINICAL  HISTORY: SOB SOB SOB FINDINGS: LINES, TUBES AND DEVICES: Endotracheal tube tip in the mid trachea. NG tube enters the stomach. LUNGS AND PLEURA: No confluent airspace opacities. No effusions. No pneumothorax. HEART AND MEDIASTINUM: No acute abnormality of the cardiac and mediastinal silhouettes. BONES AND SOFT TISSUES: No acute osseous abnormality. IMPRESSION: 1. No acute cardiopulmonary process. 2. Endotracheal tube tip in the mid trachea and enteric tube courses into the stomach. Electronically signed by: Franky Crease MD 02/29/2024 07:22 PM EST RP Workstation: [id number]     Assessment & Plan:  59 year old female with history of COPD, asthma, coronary artery disease, seizure disorder, bipolar disorder, history of drug use now admitted with history of difficulty breathing and wheezing and was intubated in the emergency room for respiratory failure..  #1: Acute kidney injury: Acute kidney injury most likely secondary to ATN due to hemodynamic compromise.  Urine output has been improving.  She had 615 cc since this morning.  No acute need for dialysis.  Will continue to monitor closely.  #2: Respiratory failure: COPD: Asthma: Possible pneumonia: Vented and sedated.  Patient has been on Rocephin  and inhaler treatments  #3: Hyperkalemia: She was given hyperkalemia protocol and also Lokelma .  Potassium is now improving.  #4: Hypotension: Presently on Levophed .  Wean as tolerated.  Spoke to the patient's husband at bedside in detail and answered all his questions to his satisfaction. Spoke to ICU team  LOS: 2 Pinkey Edman, MD [facility]. 1/18/20262:22 PM    [1]  Allergies Allergen Reactions   Flax Seed Oil [Flax Seed Oil] Hives    Pt. Self reported   Other Hives    Pt. Self reported   Trazodone Other (See Comments)    Bed wetting  Other Reaction(s): Other (See Comments)    Bed wetting   "

## 2024-03-02 NOTE — Plan of Care (Signed)
°  Problem: Clinical Measurements: Goal: Ability to maintain clinical measurements within normal limits will improve Outcome: Progressing Goal: Diagnostic test results will improve Outcome: Progressing Goal: Respiratory complications will improve Outcome: Progressing Goal: Cardiovascular complication will be avoided Outcome: Progressing   Problem: Activity: Goal: Risk for activity intolerance will decrease Outcome: Progressing   Problem: Elimination: Goal: Will not experience complications related to urinary retention Outcome: Progressing

## 2024-03-02 NOTE — Plan of Care (Signed)
 Intubated and sedated; Tolerating vent thus far this shift with adequate O2 sats, synchrony with vent. Norepinephrine  off at this time. Propofol  at 70 mcg/kg/min, and Fentanyl  at 150 mcg/hr. No additional prn's required so far this shift for sedation. No SBT today due to poor resp function, increased WOB, severe desaturations during the night with inadequate sedation. Husband and family members at bedside today; kept informed of patient's progress and status. UOP improved today; 650 cc out at the time of this writing. Delphina Lonell Moose NP in to speak with husband.   Problem: Education: Goal: Knowledge of General Education information will improve Description: Including pain rating scale, medication(s)/side effects and non-pharmacologic comfort measures Outcome: Not Progressing   Problem: Health Behavior/Discharge Planning: Goal: Ability to manage health-related needs will improve Outcome: Not Progressing   Problem: Clinical Measurements: Goal: Ability to maintain clinical measurements within normal limits will improve Outcome: Progressing Goal: Will remain free from infection Outcome: Progressing Goal: Diagnostic test results will improve Outcome: Progressing Goal: Respiratory complications will improve Outcome: Progressing Goal: Cardiovascular complication will be avoided Outcome: Progressing   Problem: Activity: Goal: Risk for activity intolerance will decrease Outcome: Progressing   Problem: Nutrition: Goal: Adequate nutrition will be maintained Outcome: Not Progressing   Problem: Coping: Goal: Level of anxiety will decrease Outcome: Progressing   Problem: Elimination: Goal: Will not experience complications related to bowel motility Outcome: Progressing Goal: Will not experience complications related to urinary retention Outcome: Progressing   Problem: Pain Managment: Goal: General experience of comfort will improve and/or be controlled Outcome: Progressing    Problem: Safety: Goal: Ability to remain free from injury will improve Outcome: Progressing   Problem: Skin Integrity: Goal: Risk for impaired skin integrity will decrease Outcome: Progressing   Problem: Education: Goal: Ability to describe self-care measures that may prevent or decrease complications (Diabetes Survival Skills Education) will improve Outcome: Progressing   Problem: Coping: Goal: Ability to adjust to condition or change in health will improve Outcome: Progressing   Problem: Fluid Volume: Goal: Ability to maintain a balanced intake and output will improve Outcome: Progressing   Problem: Health Behavior/Discharge Planning: Goal: Ability to identify and utilize available resources and services will improve Outcome: Not Progressing Goal: Ability to manage health-related needs will improve Outcome: Not Progressing   Problem: Metabolic: Goal: Ability to maintain appropriate glucose levels will improve Outcome: Progressing   Problem: Nutritional: Goal: Maintenance of adequate nutrition will improve Outcome: Not Progressing Goal: Progress toward achieving an optimal weight will improve Outcome: Not Progressing   Problem: Skin Integrity: Goal: Risk for impaired skin integrity will decrease Outcome: Progressing   Problem: Tissue Perfusion: Goal: Adequacy of tissue perfusion will improve Outcome: Progressing

## 2024-03-03 ENCOUNTER — Inpatient Hospital Stay: Payer: MEDICAID

## 2024-03-03 LAB — BASIC METABOLIC PANEL WITH GFR
Anion gap: 10 (ref 5–15)
BUN: 27 mg/dL — ABNORMAL HIGH (ref 6–20)
CO2: 28 mmol/L (ref 22–32)
Calcium: 9 mg/dL (ref 8.9–10.3)
Chloride: 103 mmol/L (ref 98–111)
Creatinine, Ser: 1.14 mg/dL — ABNORMAL HIGH (ref 0.44–1.00)
GFR, Estimated: 56 mL/min — ABNORMAL LOW
Glucose, Bld: 154 mg/dL — ABNORMAL HIGH (ref 70–99)
Potassium: 5.1 mmol/L (ref 3.5–5.1)
Sodium: 140 mmol/L (ref 135–145)

## 2024-03-03 LAB — MAGNESIUM: Magnesium: 2.8 mg/dL — ABNORMAL HIGH (ref 1.7–2.4)

## 2024-03-03 LAB — GLUCOSE, CAPILLARY
Glucose-Capillary: 153 mg/dL — ABNORMAL HIGH (ref 70–99)
Glucose-Capillary: 155 mg/dL — ABNORMAL HIGH (ref 70–99)
Glucose-Capillary: 136 mg/dL — ABNORMAL HIGH (ref 70–99)
Glucose-Capillary: 117 mg/dL — ABNORMAL HIGH (ref 70–99)
Glucose-Capillary: 151 mg/dL — ABNORMAL HIGH (ref 70–99)
Glucose-Capillary: 126 mg/dL — ABNORMAL HIGH (ref 70–99)
Glucose-Capillary: 110 mg/dL — ABNORMAL HIGH (ref 70–99)

## 2024-03-03 LAB — CBC
HCT: 33.6 % — ABNORMAL LOW (ref 36.0–46.0)
Hemoglobin: 10.5 g/dL — ABNORMAL LOW (ref 12.0–15.0)
MCH: 29.2 pg (ref 26.0–34.0)
MCHC: 31.3 g/dL (ref 30.0–36.0)
MCV: 93.3 fL (ref 80.0–100.0)
Platelets: 260 K/uL (ref 150–400)
RBC: 3.6 MIL/uL — ABNORMAL LOW (ref 3.87–5.11)
RDW: 14.2 % (ref 11.5–15.5)
WBC: 11 K/uL — ABNORMAL HIGH (ref 4.0–10.5)
nRBC: 0 % (ref 0.0–0.2)

## 2024-03-03 LAB — PHOSPHORUS: Phosphorus: 4 mg/dL (ref 2.5–4.6)

## 2024-03-03 MED ORDER — FREE WATER
30.0000 mL | Status: DC
  Administered 2024-03-03 – 2024-03-17 (×70): 30 mL

## 2024-03-03 MED ORDER — DEXMEDETOMIDINE HCL IN NACL 400 MCG/100ML IV SOLN
0.0000 ug/kg/h | INTRAVENOUS | Status: DC
  Administered 2024-03-03: 0.7 ug/kg/h via INTRAVENOUS
  Administered 2024-03-03: 0.4 ug/kg/h via INTRAVENOUS
  Administered 2024-03-03 – 2024-03-05 (×8): 1.2 ug/kg/h via INTRAVENOUS
  Filled 2024-03-03 (×10): qty 100

## 2024-03-03 MED ORDER — THIAMINE HCL 100 MG PO TABS
100.0000 mg | ORAL_TABLET | Freq: Every day | ORAL | Status: AC
  Administered 2024-03-04 – 2024-03-10 (×7): 100 mg
  Filled 2024-03-03 (×14): qty 1

## 2024-03-03 MED ORDER — ENOXAPARIN SODIUM 40 MG/0.4ML IJ SOSY
40.0000 mg | PREFILLED_SYRINGE | INTRAMUSCULAR | Status: DC
  Administered 2024-03-03 – 2024-03-07 (×5): 40 mg via SUBCUTANEOUS
  Filled 2024-03-03 (×5): qty 0.4

## 2024-03-03 MED ORDER — NITROGLYCERIN 2 % TD OINT
1.0000 [in_us] | TOPICAL_OINTMENT | Freq: Three times a day (TID) | TRANSDERMAL | Status: AC
  Administered 2024-03-03 – 2024-03-05 (×6): 1 [in_us] via TOPICAL
  Filled 2024-03-03 (×5): qty 1

## 2024-03-03 MED ORDER — PHENTOLAMINE MESYLATE 5 MG IJ SOLR
5.0000 mg | Freq: Once | INTRAMUSCULAR | Status: AC
  Administered 2024-03-03: 5 mg via SUBCUTANEOUS
  Filled 2024-03-03: qty 5

## 2024-03-03 MED ORDER — METHYLPREDNISOLONE SODIUM SUCC 40 MG IJ SOLR
30.0000 mg | Freq: Two times a day (BID) | INTRAMUSCULAR | Status: DC
  Administered 2024-03-03 – 2024-03-06 (×7): 30 mg via INTRAVENOUS
  Filled 2024-03-03 (×7): qty 1

## 2024-03-03 MED ORDER — PIVOT 1.5 CAL PO LIQD
1000.0000 mL | ORAL | Status: DC
  Administered 2024-03-03 – 2024-03-04 (×2): 1000 mL
  Administered 2024-03-04: 40 mL
  Filled 2024-03-03: qty 1000

## 2024-03-03 NOTE — Plan of Care (Signed)
" °  Problem: Clinical Measurements: Goal: Ability to maintain clinical measurements within normal limits will improve Outcome: Progressing Goal: Diagnostic test results will improve Outcome: Progressing Goal: Respiratory complications will improve Outcome: Progressing Goal: Cardiovascular complication will be avoided Outcome: Progressing   Problem: Activity: Goal: Risk for activity intolerance will decrease Outcome: Progressing   Problem: Elimination: Goal: Will not experience complications related to urinary retention Outcome: Progressing   Problem: Pain Managment: Goal: General experience of comfort will improve and/or be controlled Outcome: Progressing   Problem: Safety: Goal: Ability to remain free from injury will improve Outcome: Progressing   "

## 2024-03-03 NOTE — Progress Notes (Signed)
 "   NAME:  Erica Gomez, MRN:  [id number], DOB:  13-Oct-1965, LOS: 3 ADMISSION DATE:  02/29/2024, CONSULTATION DATE:  02/29/24 REFERRING MD:  [name] CHIEF COMPLAINT: Acute Respiratory Distress   HPI  59 y/o F with PMH sig for asthma, COPD, tobacco use disorder, bipolar disorder, seizure, polysubstance abuse, anxiety/depression, and HLD presented to ED BIB [facility] from home for acute respiratory distress.  On Chart review, patient follows with pulmonologist; last visit with Dr. Isadora on 10/25. Meds prescribed at visit included fluticasone  furoate (Arnuity Ellipta ) 200 mcg 1 puff daily and umeclidinium-vilanterol (Anoro Ellipta ) 62.5/25 mcg 1 puff daily. Per husband, patient has been taking medications as prescribed. EMS tx included MgSO? 2 g IV, Solu-Medrol  125 mg IV, DuoNeb x1, and albuterol  x1. During transport, pt became combative/confused and received Haldol 4 mg IM and Versed  5 mg IM. Initially A&O with EMS. SpO? 94% on RA.  ED Course: Initial VS: BP 211/120, HR 142, SpO? 94% on RA. Patient was unresponsive with labored/heaving respirations and in severe resp distress. Concern for hypercapnic resp failure contributing to agitation and AMS. Pt subsequently lost ability to protect airway. Given worsening MS and resp failure, decision made for emergent intubation. Pt received ketamine  and rocuronium  and was intubated successfully without complication.  Labs/Imaging: Labs: WBC 10.7, Hgb 12.7, Hct 40.9, Plt 313. BMP: Na 141, K 4.9, Cl 106, CO? 25, BUN 14, Cr 0.81, Glu 147, Ca 8.9. LFTs WNL: AST 23, ALT 20, Alk Phos 90, Tbili <0.2, Alb 4.6. VBG c/w resp acidosis/hypercapnia (HCO? 29.8), O? sat 98.3. Lactate 1.7. CXR: no acute cardiopulm process; ETT in mid trachea, enteric tube in stomach. CT head ordered and pending for AMS.   Past Medical History  Asthma COPD Tobacco use d/o Bipolar Disorder Seizure Disorder Polysubstance abuse Anxiety/depression HLD Significant Hospital Events    1/16: Admitted to ICU for asthma/COPD overlap exacerbation with hypercapnic respiratory failure requiring emergent intubation. 03/01/24- patient failed SBT today, remains on mV. She has AKI.   Strep pneumoniae +, will continue Rocephin .  Husband at bedside I met with him and we reviewed medical plan.  03/02/24- patient had resp distress overnight.  She still has AKI but there is an interval improvement.  03/03/24- patient for SBT today.  AKI has improved this am.  She requires heavy sedation. She has family coming in this afternoon who wishes to be present for SBT.   Procedures:  1/16:Endotrachael Intubation  Interim History / Subjective:    -see significant events  Micro Data:  02/29/24 SARS-CoV-2 PCR ? 02/29/24 Flu PCR ? 02/29/24 RVP ? 02/29/24 BCx2 ? 02/29/24 UCx ? 02/29/24 MRSA PCR ? 02/29/24 Strep pneumo Ur Ag ? 02/29/24 Legionella Ur Ag ?  Antimicrobials:    OBJECTIVE  Blood pressure 113/70, pulse 81, temperature (!) 97.5 F (36.4 C), resp. rate (!) 22, height 5' 6 (1.676 m), weight 76 kg, SpO2 95%.    Vent Mode: PRVC FiO2 (%):  [28 %-35 %] 28 % Set Rate:  [22 bmp] 22 bmp Vt Set:  [450 mL] 450 mL PEEP:  [5 cmH20] 5 cmH20 Plateau Pressure:  [21 cmH20-23 cmH20] 21 cmH20   Intake/Output Summary (Last 24 hours) at 03/03/2024 0811 Last data filed at 03/03/2024 0400 Gross per 24 hour  Intake 1327.11 ml  Output 1265 ml  Net 62.11 ml   Filed Weights   02/29/24 1824 03/01/24 0500 03/03/24 0500  Weight: 77.5 kg 75.6 kg 76 kg    Physical Examination  GEN: Critically ill  patient, intubated and sedated HEENT: [place]/AT. PERRL, sclerae anicteric. HEART: regular rhythm, normal rate, S1, S2, no M/R/G,  LUNGS: CTAB, diminished bilateral without wheezes, no increased WOB,  EXTREMITIES: No Edema, cap refill  NEURO: No gross focal deficits. PSYCH:  UTA ABDOMINAL: Soft: BS x 4, NTND SKIN: Intact, warm, no rashes lesion, or ulcer  RESULTS  Strep pneumoniae urinary antigen (not  at [facility]) Order: [id number]  Status: Final result     Next appt: 06/09/2024 at 09:00 AM in Pulmonology ([facility])   Test Result Released: Yes (not seen)   0 Result Notes    Component Ref Range & Units (hover) 1 d ago  Strep Pneumo Urinary Antigen POSITIVE Abnormal          Assessment & Plan:   #Acute hypoxemic Hypercapnic Respiratory Failure  -strep pneumoniae induced AECOPD -c/w rocephin /steroids -lasix  20 x1 today Hx of Severe asthma/COPD overlap; hypercapnia, AMS; smoker on Arnuity, Anoro -PRVC vent: FiO? 35%, Vt 450 mL, RR 22, PEEP 5; titrate per ABG -IV methylprednisolone  -Neb ipratropium-albuterol  q4h + PRN q6h -propofol  + fentanyl , RASS -1 -PRN ABG &Xray -Nicotine  replacement therapy  Sepsis - due to pneumonia with strep pneumoniae   Present on admission     Continue rocephin  IV   #AMS septic encephalopathy due to pneumonia Multifactorial (hypercapnia, hypoxia, psych dz, ICU stress); CT head neg -Titrate sedation to RASS -1 -Daily neuro checks + delirium screen  #Seizure Disorder Hx seizures, no recent events -Continue lamotrigine  via tube -Seizure precautions -PRN benzo if breakthrough -Avoid tramadol/other seizure-lowering meds -Monitor metabolic/electrolyte derangements    #Hypertension #Hyperlipidemia Chronic, poorly controlled; ED BP 211/120 ? 128/89 -Treat per ICU protocol if persistent -Resume home anti-HTN when stable -resume rosuvastatin  once enteral access  #Psych (Anxiety/Depression/Bipolar) -Resume home citalopram , Atarax , Adderall PO -Psychiatry consult if agitation persists post-extubation  #Prediabetes / Glycemic Control HbA1c 6.4-6.7%; CBG 147-177 -Check glucose q4-6h -SSI  -Goal 140-180 mg/dL -Hold metformin  while NPO -Early enteral nutrition when feasible  #Polysubstance / Tobacco Use Active smoker, past polysubstance abuse -UDS +Amphetamine /on Adderall at home -Nicotine  replacement therapy -Counseling/support  post-extubation -Avoid sedatives that worsen withdrawal  Best practice:  Diet:  NPO Pain/Anxiety/Delirium protocol (if indicated): Yes (RASS goal -1) VAP protocol (if indicated): Yes DVT prophylaxis: LMWH GI prophylaxis: H2B Glucose control:  SSI No Central venous access:  N/A Arterial line:  N/A Foley:  N/A Mobility:  bed rest  PT/OT consulted: N/A Code Status:  full code Disposition: ICU   = Goals of Care =  Primary Emergency Contact: Becknell,John   Labs/imaging that I havepersonally reviewed  (right click and Reselect all SmartList Selections daily)   DG Chest Port 1 View Result Date: 03/02/2024 EXAM: 1 VIEW(S) XRAY OF THE CHEST 03/02/2024 02:38:00 PM COMPARISON: 02/29/2024 CLINICAL HISTORY: Infiltrate of both lungs present on imaging study. FINDINGS: LINES, TUBES AND DEVICES: Endotracheal tube in place with tip 6.5 cm above the carina. Enteric tube in place with tip coursing below the diaphragm with tip and side port colimated off view. LUNGS AND PLEURA: Increased bibasilar patchy opacities. Trace bilateral pleural effusions. No pneumothorax. HEART AND MEDIASTINUM: No acute abnormality of the cardiac and mediastinal silhouettes. BONES AND SOFT TISSUES: No acute osseous abnormality. IMPRESSION: 1. Increased bibasilar patchy opacities. 2. Trace bilateral pleural effusions. 3. Lines and tubes as above. Electronically signed by: Morgane Naveau MD 03/02/2024 03:37 PM EST RP Workstation: [id number]   US  RENAL Result Date: 03/01/2024 EXAM: RETROPERITONEAL ULTRASOUND OF THE KIDNEYS 03/01/2024 01:31:34 PM TECHNIQUE: Real-time  ultrasonography of the retroperitoneum, specifically the kidneys and urinary bladder, was performed. COMPARISON: None available. CLINICAL HISTORY: AKI (acute kidney injury). FINDINGS: RIGHT KIDNEY: Right kidney measures 10.8 x 4.4 x 4.8 cm. Right kidney volume equals 119 ml. Normal cortical echogenicity. No hydronephrosis. No calculus. No mass. LEFT KIDNEY: Left kidney  measures 9.9 x 5.3 x 4.3 cm. Left kidney volume equals 113 ml. Normal cortical echogenicity. No hydronephrosis. No calculus. No mass. BLADDER: Bladder collapsed around foley catheter. IMPRESSION: 1. No sonographic abnormality of the kidneys. Electronically signed by: Norleen Boxer MD 03/01/2024 05:37 PM EST RP Workstation: [id number]     Labs   CBC: Recent Labs  Lab 02/29/24 1833 03/01/24 0201 03/02/24 0326 03/03/24 0346  WBC 10.7* 6.0 11.6* 11.0*  NEUTROABS 5.2  --   --   --   HGB 12.7 12.2 11.6* 10.5*  HCT 40.9 38.6 37.7 33.6*  MCV 94.9 92.6 95.4 93.3  PLT 313 286 292 260    Basic Metabolic Panel: Recent Labs  Lab 03/01/24 0201 03/01/24 0614 03/01/24 1236 03/01/24 1726 03/01/24 2208 03/02/24 0326 03/02/24 1252 03/03/24 0346  NA 138 137  --   --  140 138 138 140  K 5.6* 5.7*   < > 5.6* 5.0 5.7* 5.4* 5.1  CL 102 105  --   --  103 102 104 103  CO2 22 22  --   --  25 23 26 28   GLUCOSE 189* 145*  --   --  137* 180* 127* 154*  BUN 19 22*  --   --  33* 33* 29* 27*  CREATININE 1.55* 1.76*  --   --  2.37* 2.26* 1.55* 1.14*  CALCIUM  9.1 9.1  --   --  9.0 9.0 9.4 9.0  MG 3.4*  --   --   --   --  3.2*  --  2.8*  PHOS 3.7  --   --   --   --  5.9*  --  4.0   < > = values in this interval not displayed.   GFR: Estimated Creatinine Clearance: 56 mL/min (A) (by C-G formula based on SCr of 1.14 mg/dL (H)). Recent Labs  Lab 02/29/24 1833 02/29/24 1834 02/29/24 2018 03/01/24 0201 03/02/24 0326 03/03/24 0346  PROCALCITON  --   --  <0.10  --   --   --   WBC 10.7*  --   --  6.0 11.6* 11.0*  LATICACIDVEN  --  1.7 1.7  --   --   --     Liver Function Tests: Recent Labs  Lab 02/29/24 1833  AST 23  ALT 20  ALKPHOS 90  BILITOT <0.2  PROT 7.1  ALBUMIN 4.6   No results for input(s): LIPASE, AMYLASE in the last 168 hours. No results for input(s): AMMONIA in the last 168 hours.  ABG    Component Value Date/Time   PHART 7.34 (L) 03/01/2024 0432   PCO2ART 47 03/01/2024  0432   PO2ART 92 03/01/2024 0432   HCO3 25.4 03/01/2024 0432   ACIDBASEDEF 0.8 03/01/2024 0432   O2SAT 98.6 03/01/2024 0432     Coagulation Profile: No results for input(s): INR, PROTIME in the last 168 hours.  Cardiac Enzymes: No results for input(s): CKTOTAL, CKMB, CKMBINDEX, TROPONINI in the last 168 hours.  HbA1C: Hemoglobin A1C  Date/Time Value Ref Range Status  09/21/2021 02:00 PM 6.4 (A) 4.0 - 5.6 % Final  03/22/2021 09:52 AM 6.2 (A) 4.0 - 5.6 % Final  01/15/2014 12:00  AM 6.8  Final   HbA1c, POC (prediabetic range)  Date/Time Value Ref Range Status  05/11/2020 10:30 AM 0 (A) 5.7 - 6.4 % Final   HbA1c, POC (controlled diabetic range)  Date/Time Value Ref Range Status  05/11/2020 10:30 AM 0.0 0.0 - 7.0 % Final   HbA1c POC (<> result, manual entry)  Date/Time Value Ref Range Status  05/11/2020 10:30 AM 6.5 4.0 - 5.6 % Final   Hgb A1c MFr Bld  Date/Time Value Ref Range Status  03/01/2024 02:02 AM 6.4 (H) 4.8 - 5.6 % Final    Comment:    (NOTE) Diagnosis of Diabetes The following HbA1c ranges recommended by the American Diabetes Association (ADA) may be used as an aid in the diagnosis of diabetes mellitus.  Hemoglobin             Suggested A1C NGSP%              Diagnosis  <5.7                   Non Diabetic  5.7-6.4                Pre-Diabetic  >6.4                   Diabetic  <7.0                   Glycemic control for                       adults with diabetes.    12/24/2019 11:08 AM 6.7 (H) 4.8 - 5.6 % Final    Comment:             Prediabetes: 5.7 - 6.4          Diabetes: >6.4          Glycemic control for adults with diabetes: <7.0    CBG: Recent Labs  Lab 03/02/24 1933 03/02/24 2322 03/03/24 0315 03/03/24 0549 03/03/24 0729  GLUCAP 114* 117* 153* 155* 136*    Review of Systems:   Unable to be obtained secondary to the patient's intubated and sedated status.   Past Medical History  She,  has a past medical history of  Allergy, Anxiety, Asthma, Bilateral carpal tunnel syndrome (12/31/2018), Bipolar affective, manic (HCC) (1996), COPD (chronic obstructive pulmonary disease) (HCC), Depression, HLD (hyperlipidemia) (03/12/2018), Lung nodule, multiple (09/26/2016), Manic depressive illness (HCC) (1996), Seizures (HCC), Substance abuse (HCC), and Tobacco abuse (10/04/2016).   Surgical History    Past Surgical History:  Procedure Laterality Date   ABDOMINAL HYSTERECTOMY     CARPAL TUNNEL RELEASE Right 01/15/2019   Procedure: RIGHTCARPAL TUNNEL RELEASE, LEFT DEQUERVAINS INJECTION;  Surgeon: Jerri Kay HERO, MD;  Location: [facility];  Service: Orthopedics;  Laterality: Right;   CARPAL TUNNEL RELEASE Left 02/26/2019   Procedure: LEFT CARPAL TUNNEL RELEASE;  Surgeon: Jerri Kay HERO, MD;  Location: [facility];  Service: Orthopedics;  Laterality: Left;  Bier block   COLONOSCOPY WITH PROPOFOL  N/A 11/28/2018   Procedure: COLONOSCOPY WITH PROPOFOL ;  Surgeon: Therisa Bi, MD;  Location: [facility];  Service: Gastroenterology;  Laterality: N/A;   TUBAL LIGATION       Social History   reports that she has been smoking cigarettes. Her smokeless tobacco use includes chew and snuff. She reports that she does not currently use alcohol. She reports that she does not use drugs.   Family History  Her family history includes Bipolar disorder in her brother and brother; CAD in her father; Heart attack in her father; Other in her mother; Schizophrenia in her brother and brother; Stroke in her mother.   Allergies Allergies[1]   Home Medications  Prior to Admission medications  Medication Sig Start Date End Date Taking? Authorizing Provider  albuterol  (PROVENTIL  HFA) 108 (90 Base) MCG/ACT inhaler Inhale 2 puffs into the lungs every 4-6 hours as needed. 09/21/21   Iloabachie, Chioma E, NP  amphetamine-dextroamphetamine (ADDERALL XR) 10 MG 24 hr capsule Take 10 mg by mouth daily.    [provider]   citalopram  (CELEXA ) 40 MG tablet Take 1 tablet (40 mg total) by mouth daily. 11/21/22     Fluticasone  Furoate (ARNUITY ELLIPTA ) 200 MCG/ACT AEPB Inhale 1 puff into the lungs daily. 12/06/23   Isadora Hose, MD  hydrOXYzine  (ATARAX ) 50 MG tablet Take 1-1.5 tablets (50-75 mg total) by mouth at bedtime as needed for sleep 11/21/22     ipratropium-albuterol  (DUONEB) 0.5-2.5 (3) MG/3ML SOLN INHALE CONTENTS OF ONE VIAL ( TOTAL) USING NEBULIZER  ONCE EVERY 6 HOURS. 11/02/20 12/06/23  Iloabachie, Chioma E, NP  lamoTRIgine  (LAMICTAL ) 100 MG tablet Take 1 tablet (100 mg total) by mouth 2 (two) times daily. 06/23/20   Gayland Lauraine PARAS, NP  lamoTRIgine  (LAMICTAL ) 100 MG tablet Take 1 tablet (100 mg total) by mouth daily. 11/21/22     meloxicam  (MOBIC ) 15 MG tablet Take 15 mg by mouth daily.    [provider]  metFORMIN  (GLUCOPHAGE ) 500 MG tablet TAKE ONE TABLET BY MOUTH TWICE DAILY WITH MEALS. 09/21/21   Iloabachie, Chioma E, NP  methocarbamol  (ROBAXIN ) 500 MG tablet Take 500 mg by mouth at bedtime.    [provider]  nicotine  polacrilex (NICOTINE  MINI) 2 MG lozenge Take 1 lozenge (2 mg total) by mouth every 2 (two) hours as needed for smoking cessation. 12/06/23 03/05/24  Isadora Hose, MD  rosuvastatin  (CRESTOR ) 10 MG tablet Take 10 mg by mouth at bedtime. 08/13/23   [provider]  traMADol (ULTRAM) 50 MG tablet Take 1 tablet by mouth every 6 (six) hours as needed.    [provider]  triamcinolone  cream (KENALOG ) 0.1 % Apply 1 Application topically 2 (two) times daily. 09/21/21   Iloabachie, Chioma E, NP  umeclidinium-vilanterol (ANORO ELLIPTA ) 62.5-25 MCG/ACT AEPB Inhale 1 puff into the lungs daily. 12/06/23   Isadora Hose, MD  atomoxetine  (STRATTERA ) 25 MG capsule Take 1 capsule (25 mg total) by mouth once every evening after dinner. Patient not taking: Reported on 12/23/2020 10/26/20 03/09/21    loratadine  (CLARITIN ) 10 MG tablet Take 1 tablet (10 mg total) by mouth daily  as needed for allergies. 12/16/19 12/25/19  Iloabachie, Chioma E, NP   Scheduled Meds:  Chlorhexidine  Gluconate Cloth  6 each Topical Daily   citalopram   40 mg Per Tube Daily   enoxaparin  (LOVENOX ) injection  30 mg Subcutaneous Q24H   insulin  aspart  0-15 Units Subcutaneous Q4H   ipratropium-albuterol   3 mL Nebulization Q4H   lamoTRIgine   100 mg Per Tube Daily   methylPREDNISolone  (SOLU-MEDROL ) injection  40 mg Intravenous Q12H   mouth rinse  15 mL Mouth Rinse Q2H   Continuous Infusions:  sodium chloride  5 mL/hr at 03/03/24 0200   cefTRIAXone  (ROCEPHIN )  IV 200 mL/hr at 03/03/24 0200   fentaNYL  infusion INTRAVENOUS 150 mcg/hr (03/03/24 0200)   ketamine  (KETALAR ) adult infusion Stopped (03/01/24 2121)   norepinephrine  (LEVOPHED ) Adult infusion Stopped (03/02/24 1400)  propofol  (DIPRIVAN ) infusion 70 mcg/kg/min (03/03/24 0551)   PRN Meds:.sodium chloride , acetaminophen , albuterol , fentaNYL , lip balm, midazolam  PF, mouth rinse, polyethylene glycol, senna  Critical care provider statement:   Total critical care time: 33 minutes   Performed by: Parris MD   Critical care time was exclusive of separately billable procedures and treating other patients.   Critical care was necessary to treat or prevent imminent or life-threatening deterioration.   Critical care was time spent personally by me on the following activities: development of treatment plan with patient and/or surrogate as well as nursing, discussions with consultants, evaluation of patient's response to treatment, examination of patient, obtaining history from patient or surrogate, ordering and performing treatments and interventions, ordering and review of laboratory studies, ordering and review of radiographic studies, pulse oximetry and re-evaluation of patient's condition.    Rhylin Venters, M.D.  Pulmonary & Critical Care Medicine            [1]  Allergies Allergen Reactions   Flax Seed Oil [Flax Seed Oil]  Hives    Pt. Self reported   Other Hives    Pt. Self reported   Trazodone Other (See Comments)    Bed wetting  Other Reaction(s): Other (See Comments)    Bed wetting   "

## 2024-03-03 NOTE — Progress Notes (Signed)
 PHARMACY CONSULT NOTE - ELECTROLYTES  Pharmacy Consult for Electrolyte Monitoring and Replacement   Recent Labs: Height: 5' 6 (167.6 cm) Weight: 76 kg (167 lb 8.8 oz) IBW/kg (Calculated) : 59.3 Estimated Creatinine Clearance: 56 mL/min (A) (by C-G formula based on SCr of 1.14 mg/dL (H)).  Potassium (mmol/L)  Date Value  03/03/2024 5.1  11/12/2013 4.1   Magnesium  (mg/dL)  Date Value  98/80/7973 2.8 (H)   Calcium  (mg/dL)  Date Value  98/80/7973 9.0   Calcium , Total (mg/dL)  Date Value  90/69/7984 9.0   Albumin (g/dL)  Date Value  98/83/7973 4.6  05/12/2020 4.5  11/12/2013 3.5   Phosphorus (mg/dL)  Date Value  98/80/7973 4.0   Sodium (mmol/L)  Date Value  03/03/2024 140  12/06/2022 139  11/12/2013 141    Assessment  Erica Gomez is a 59 y.o. female presenting with respiratory failute. PMH significant for COPD, asthma, CAD, seizures, and bipolar disorder . Pharmacy has been consulted to monitor and replace electrolytes.  Goal of Therapy: Electrolytes WNL  Plan:  NO replacement needed today. Follow with AM labs  Thank you for allowing pharmacy to be a part of this patient's care.  Adriana Bolster, PharmD, BCPS 03/03/2024 7:55 AM

## 2024-03-03 NOTE — Progress Notes (Signed)
 [facility] [place], [place] 03/03/24  Subjective:   Hospital day # 60 59 year old female with medical problems of COPD, asthma, coronary disease, seizure disorder, bipolar disorder, history of drug use now admitted for difficulty breathing, wheezing.  Intubated in the emergency room for respiratory failure.  Nephrology consult has been requested for acute kidney injury.  Renal ultrasound from 03/01/2024 is unremarkable.  Urinalysis from 02/29/2024 shows small amount of protein.  Negative for WBC, 0-5 RBCs.  Strep pneumo antigen positive. Patient is currently sedated with fentanyl  and propofol .  She is ventilator assisted.  FiO2 8%, PEEP 5.   Foley catheter in place.  Urine output 1265 yesterday.  Serum creatinine had peaked at 2.37 but is down to 1.14 today.  Baseline creatinine of 0.81 from 02/29/2024.     Objective:  Vital signs in last 24 hours:  Temp:  [97 F (36.1 C)-99.7 F (37.6 C)] 99.3 F (37.4 C) (01/19 1030) Pulse Rate:  [76-113] 113 (01/19 1030) Resp:  [9-28] 12 (01/19 1030) BP: (92-154)/(54-103) 103/81 (01/19 1030) SpO2:  [83 %-100 %] 83 % (01/19 1030) FiO2 (%):  [28 %] 28 % (01/19 0815) Weight:  [76 kg] 76 kg (01/19 0500)  Weight change:  Filed Weights   02/29/24 1824 03/01/24 0500 03/03/24 0500  Weight: 77.5 kg 75.6 kg 76 kg    Intake/Output:    Intake/Output Summary (Last 24 hours) at 03/03/2024 1125 Last data filed at 03/03/2024 1000 Gross per 24 hour  Intake 1442.32 ml  Output 1050 ml  Net 392.32 ml     Physical Exam: General: Critically ill-appearing, laying in the bed  HEENT ET tube in place.  Pulm/lungs Ventilator assisted, coarse breath sounds  CVS/Heart Regular, no rub  Abdomen:  Soft, nontender, nondistended  Extremities: Trace edema  Neurologic: Sedated  Skin: No acute rashes  Access: No dialysis catheter       Basic Metabolic Panel:  Recent Labs  Lab 03/01/24 0201 03/01/24 0614 03/01/24 1236 03/01/24 1726  03/01/24 2208 03/02/24 0326 03/02/24 1252 03/03/24 0346  NA 138 137  --   --  140 138 138 140  K 5.6* 5.7*   < > 5.6* 5.0 5.7* 5.4* 5.1  CL 102 105  --   --  103 102 104 103  CO2 22 22  --   --  25 23 26 28   GLUCOSE 189* 145*  --   --  137* 180* 127* 154*  BUN 19 22*  --   --  33* 33* 29* 27*  CREATININE 1.55* 1.76*  --   --  2.37* 2.26* 1.55* 1.14*  CALCIUM  9.1 9.1  --   --  9.0 9.0 9.4 9.0  MG 3.4*  --   --   --   --  3.2*  --  2.8*  PHOS 3.7  --   --   --   --  5.9*  --  4.0   < > = values in this interval not displayed.     CBC: Recent Labs  Lab 02/29/24 1833 03/01/24 0201 03/02/24 0326 03/03/24 0346  WBC 10.7* 6.0 11.6* 11.0*  NEUTROABS 5.2  --   --   --   HGB 12.7 12.2 11.6* 10.5*  HCT 40.9 38.6 37.7 33.6*  MCV 94.9 92.6 95.4 93.3  PLT 313 286 292 260     No results found for: HEPBSAG, HEPBSAB, HEPBIGM    Microbiology:  Recent Results (from the past 240 hours)  Culture, blood (routine x 2)  Status: None (Preliminary result)   Collection Time: 02/29/24  6:44 PM   Specimen: BLOOD  Result Value Ref Range Status   Specimen Description BLOOD LEFT ANTECUBITAL  Final   Special Requests   Final    BOTTLES DRAWN AEROBIC AND ANAEROBIC Blood Culture adequate volume   Culture   Final    NO GROWTH 3 DAYS Performed at [facility], [address], [place], [place] [address]    Report Status PENDING  Incomplete  Culture, blood (routine x 2)     Status: None (Preliminary result)   Collection Time: 02/29/24  8:18 PM   Specimen: BLOOD  Result Value Ref Range Status   Specimen Description BLOOD RIGHT ANTECUBITAL  Final   Special Requests   Final    BOTTLES DRAWN AEROBIC AND ANAEROBIC Blood Culture results may not be optimal due to an inadequate volume of blood received in culture bottles   Culture   Final    NO GROWTH 3 DAYS Performed at [facility], [address] [place], [place] [address]    Report Status PENDING  Incomplete   Respiratory (~20 pathogens) panel by PCR     Status: None   Collection Time: 02/29/24  8:18 PM   Specimen: Urine, Catheterized; Respiratory  Result Value Ref Range Status   Adenovirus NOT DETECTED NOT DETECTED Final   Coronavirus 229E NOT DETECTED NOT DETECTED Final    Comment: (NOTE) The Coronavirus on the Respiratory Panel, DOES NOT test for the novel  Coronavirus (2019 nCoV)    Coronavirus HKU1 NOT DETECTED NOT DETECTED Final   Coronavirus NL63 NOT DETECTED NOT DETECTED Final   Coronavirus OC43 NOT DETECTED NOT DETECTED Final   Metapneumovirus NOT DETECTED NOT DETECTED Final   Rhinovirus / Enterovirus NOT DETECTED NOT DETECTED Final   Influenza A NOT DETECTED NOT DETECTED Final   Influenza B NOT DETECTED NOT DETECTED Final   Parainfluenza Virus 1 NOT DETECTED NOT DETECTED Final   Parainfluenza Virus 2 NOT DETECTED NOT DETECTED Final   Parainfluenza Virus 3 NOT DETECTED NOT DETECTED Final   Parainfluenza Virus 4 NOT DETECTED NOT DETECTED Final   Respiratory Syncytial Virus NOT DETECTED NOT DETECTED Final   Bordetella pertussis NOT DETECTED NOT DETECTED Final   Bordetella Parapertussis NOT DETECTED NOT DETECTED Final   Chlamydophila pneumoniae NOT DETECTED NOT DETECTED Final   Mycoplasma pneumoniae NOT DETECTED NOT DETECTED Final    Comment: Performed at [facility], [address], [place], [place] [address]  Resp panel by RT-PCR (RSV, Flu A&B, Covid) Urine, Catheterized     Status: None   Collection Time: 02/29/24  8:18 PM   Specimen: Urine, Catheterized; Nasal Swab  Result Value Ref Range Status   SARS Coronavirus 2 by RT PCR NEGATIVE NEGATIVE Final    Comment: (NOTE) SARS-CoV-2 target nucleic acids are NOT DETECTED.  The SARS-CoV-2 RNA is generally detectable in upper respiratory specimens during the acute phase of infection. The lowest concentration of SARS-CoV-2 viral copies this assay can detect is 138 copies/mL. A negative result does not preclude  SARS-Cov-2 infection and should not be used as the sole basis for treatment or other patient management decisions. A negative result may occur with  improper specimen collection/handling, submission of specimen other than nasopharyngeal swab, presence of viral mutation(s) within the areas targeted by this assay, and inadequate number of viral copies(<138 copies/mL). A negative result must be combined with clinical observations, patient history, and epidemiological information. The expected result is Negative.  Fact Sheet for Patients:  bloggercourse.com  Fact Sheet for Healthcare Providers:  seriousbroker.it  This test is no t yet approved or cleared by the [place]  FDA and  has been authorized for detection and/or diagnosis of SARS-CoV-2 by FDA under an Emergency Use Authorization (EUA). This EUA will remain  in effect (meaning this test can be used) for the duration of the COVID-19 declaration under Section 564(b)(1) of the Act, 21 U.S.C.section 360bbb-3(b)(1), unless the authorization is terminated  or revoked sooner.       Influenza A by PCR NEGATIVE NEGATIVE Final   Influenza B by PCR NEGATIVE NEGATIVE Final    Comment: (NOTE) The Xpert Xpress SARS-CoV-2/FLU/RSV plus assay is intended as an aid in the diagnosis of influenza from Nasopharyngeal swab specimens and should not be used as a sole basis for treatment. Nasal washings and aspirates are unacceptable for Xpert Xpress SARS-CoV-2/FLU/RSV testing.  Fact Sheet for Patients: bloggercourse.com  Fact Sheet for Healthcare Providers: seriousbroker.it  This test is not yet approved or cleared by the [place]  FDA and has been authorized for detection and/or diagnosis of SARS-CoV-2 by FDA under an Emergency Use Authorization (EUA). This EUA will remain in effect (meaning this test can be used) for the duration of  the COVID-19 declaration under Section 564(b)(1) of the Act, 21 U.S.C. section 360bbb-3(b)(1), unless the authorization is terminated or revoked.     Resp Syncytial Virus by PCR NEGATIVE NEGATIVE Final    Comment: (NOTE) Fact Sheet for Patients: bloggercourse.com  Fact Sheet for Healthcare Providers: seriousbroker.it  This test is not yet approved or cleared by the [place]  FDA and has been authorized for detection and/or diagnosis of SARS-CoV-2 by FDA under an Emergency Use Authorization (EUA). This EUA will remain in effect (meaning this test can be used) for the duration of the COVID-19 declaration under Section 564(b)(1) of the Act, 21 U.S.C. section 360bbb-3(b)(1), unless the authorization is terminated or revoked.  Performed at [facility], [address] [place], [place] [address]   MRSA Next Gen by PCR, Nasal     Status: None   Collection Time: 02/29/24  8:18 PM   Specimen: Urine, Catheterized; Nasal Swab  Result Value Ref Range Status   MRSA by PCR Next Gen NOT DETECTED NOT DETECTED Final    Comment: (NOTE) The GeneXpert MRSA Assay (FDA approved for NASAL specimens only), is one component of a comprehensive MRSA colonization surveillance program. It is not intended to diagnose MRSA infection nor to guide or monitor treatment for MRSA infections. Test performance is not FDA approved in patients less than 76 years old. Performed at [facility], [address] [place], [place] [address]   Culture, Respiratory w Gram Stain     Status: None (Preliminary result)   Collection Time: 03/01/24  1:54 PM   Specimen: Tracheal Aspirate; Respiratory  Result Value Ref Range Status   Specimen Description   Final    TRACHEAL ASPIRATE Performed at [facility], [address] [place], [place] [address]    Special Requests   Final    NONE Performed at [facility], [address] [place], [place] [address]    Gram Stain   Final    FEW WBC PRESENT, PREDOMINANTLY PMN FEW GRAM POSITIVE COCCI    Culture   Final    CULTURE REINCUBATED FOR BETTER GROWTH Performed at [facility], [address] [place], [place] [address]    Report Status PENDING  Incomplete    Coagulation Studies: No results for input(s): LABPROT, INR in the last 72 hours.  Urinalysis: Recent Labs    02/29/24 2018  COLORURINE STRAW*  LABSPEC 1.019  PHURINE 5.0  GLUCOSEU NEGATIVE  HGBUR NEGATIVE  BILIRUBINUR NEGATIVE  KETONESUR NEGATIVE  PROTEINUR 30*  NITRITE NEGATIVE  LEUKOCYTESUR NEGATIVE      Imaging: DG Chest Port 1 View Result Date: 03/02/2024 EXAM: 1 VIEW(S) XRAY OF THE CHEST 03/02/2024 02:38:00 PM COMPARISON: 02/29/2024 CLINICAL HISTORY: Infiltrate of both lungs present on imaging study. FINDINGS: LINES, TUBES AND DEVICES: Endotracheal tube in place with tip 6.5 cm above the carina. Enteric tube in place with tip coursing below the diaphragm with tip and side port colimated off view. LUNGS AND PLEURA: Increased bibasilar patchy opacities. Trace bilateral pleural effusions. No pneumothorax. HEART AND MEDIASTINUM: No acute abnormality of the cardiac and mediastinal silhouettes. BONES AND SOFT TISSUES: No acute osseous abnormality. IMPRESSION: 1. Increased bibasilar patchy opacities. 2. Trace bilateral pleural effusions. 3. Lines and tubes as above. Electronically signed by: Morgane Naveau MD 03/02/2024 03:37 PM EST RP Workstation: [id number]   US  RENAL Result Date: 03/01/2024 EXAM: RETROPERITONEAL ULTRASOUND OF THE KIDNEYS 03/01/2024 01:31:34 PM TECHNIQUE: Real-time ultrasonography of the retroperitoneum, specifically the kidneys and urinary bladder, was performed. COMPARISON: None available. CLINICAL HISTORY: AKI (acute kidney injury). FINDINGS: RIGHT KIDNEY: Right kidney measures 10.8 x 4.4 x 4.8 cm. Right kidney volume equals 119 ml. Normal cortical echogenicity. No  hydronephrosis. No calculus. No mass. LEFT KIDNEY: Left kidney measures 9.9 x 5.3 x 4.3 cm. Left kidney volume equals 113 ml. Normal cortical echogenicity. No hydronephrosis. No calculus. No mass. BLADDER: Bladder collapsed around foley catheter. IMPRESSION: 1. No sonographic abnormality of the kidneys. Electronically signed by: Norleen Boxer MD 03/01/2024 05:37 PM EST RP Workstation: [id number]     Medications:    sodium chloride  5 mL/hr at 03/03/24 1000   cefTRIAXone  (ROCEPHIN )  IV Stopped (03/03/24 0201)   dexmedetomidine  (PRECEDEX ) IV infusion 0.4 mcg/kg/hr (03/03/24 1124)   fentaNYL  infusion INTRAVENOUS 200 mcg/hr (03/03/24 1000)   propofol  (DIPRIVAN ) infusion 80 mcg/kg/min (03/03/24 1000)    Chlorhexidine  Gluconate Cloth  6 each Topical Daily   citalopram   40 mg Per Tube Daily   enoxaparin  (LOVENOX ) injection  40 mg Subcutaneous Q24H   insulin  aspart  0-15 Units Subcutaneous Q4H   ipratropium-albuterol   3 mL Nebulization Q4H   lamoTRIgine   100 mg Per Tube Daily   methylPREDNISolone  (SOLU-MEDROL ) injection  30 mg Intravenous Q12H   nitroGLYCERIN   1 inch Topical Q8H   mouth rinse  15 mL Mouth Rinse Q2H   phentolamine   5 mg Subcutaneous Once   sodium chloride , acetaminophen , albuterol , fentaNYL , lip balm, midazolam  PF, mouth rinse, polyethylene glycol, senna  Assessment/ Plan:  59 y.o. female with   medical problems of COPD, asthma, coronary disease, seizure disorder, bipolar disorder, history of drug use admitted on 02/29/2024 for COPD exacerbation (HCC) [J44.1] Acute respiratory failure with hypoxia and hypercapnia (HCC) [J96.01, J96.02] Acute on chronic respiratory failure with hypoxia and hypercapnia (HCC) [J96.21, J96.22]   Acute kidney injury-patient is nonoliguric with good urine output and serum creatinine now back to baseline.  AKI likely secondary to concurrent illness, ATN.  Due to renal recovery, we will sign off.  Please reconsult as necessary.  Acute respiratory  failure-currently ventilator dependent.  Management as per ICU team.  Pneumonia-urinary strep pneumo antigen is positive.Currently being treated with IV ceftriaxone .    LOS: 3 Shadae Reino 1/19/202611:25 AM  [facility]  [address] [place], [place] [phone]  Note: This note was prepared with Dragon dictation. Any transcription errors are unintentional

## 2024-03-03 NOTE — Progress Notes (Signed)
 Pt's husband updated at bedside on plan of care.  All questions answered to his satisfaction.      Inge Lecher, AGACNP-BC [facility] Prefer epic messenger for cross cover needs If after hours, please call E-link

## 2024-03-03 NOTE — Progress Notes (Signed)
 PHARMACY CONSULT NOTE - ELECTROLYTES  Pharmacy Consult for Electrolyte Monitoring and Replacement   Recent Labs: Height: 5' 6 (167.6 cm) Weight: 76 kg (167 lb 8.8 oz) IBW/kg (Calculated) : 59.3 Estimated Creatinine Clearance: 56 mL/min (A) (by C-G formula based on SCr of 1.14 mg/dL (H)).  Potassium (mmol/L)  Date Value  03/03/2024 5.1  11/12/2013 4.1   Magnesium  (mg/dL)  Date Value  98/80/7973 2.8 (H)   Calcium  (mg/dL)  Date Value  98/80/7973 9.0   Calcium , Total (mg/dL)  Date Value  90/69/7984 9.0   Albumin (g/dL)  Date Value  98/83/7973 4.6  05/12/2020 4.5  11/12/2013 3.5   Phosphorus (mg/dL)  Date Value  98/80/7973 4.0   Sodium (mmol/L)  Date Value  03/03/2024 140  12/06/2022 139  11/12/2013 141    Assessment  Erica Gomez is a 59 y.o. female presenting with respiratory failure. PMH significant for COPD, asthma, CAD, seizures, and bipolar disorder. Pt has been hyperkalemic up to 6.1 on 1/17. Pharmacy has been consulted to monitor and replace electrolytes.  Diet: none  Goal of Therapy: Electrolytes WNL  Plan:  K down to 5.1 today, no interventions indicated Rest of electrolytes are WNL Follow K, Mg, and BMP with AM labs  Thank you for allowing pharmacy to be a part of this patient's care.  Belvie Macintosh, PharmD Candidate 03/03/2024 8:29 AM

## 2024-03-03 NOTE — Progress Notes (Signed)
 Initial Nutrition Assessment  DOCUMENTATION CODES:   Not applicable  INTERVENTION:   If pt does not extubate:  Pivot 1.5@50ml /hr- Initiate at 38ml/hr and increase by 10ml/hr q 8 hours until goal rate is reached.   Free water  flushes 30ml q4 hours to maintain tube patency   Regimen provides 1800kcal/day, 113g/day protein and 1064ml/day of free water .   Thiamine  100mg  daily via tube x 7 days   Pt at high refeed risk; recommend monitor potassium, magnesium  and phosphorus labs daily until stable  Daily weights   NUTRITION DIAGNOSIS:   Inadequate oral intake related to inability to eat (pt sedated and ventilated) as evidenced by NPO status.  GOAL:   Provide needs based on ASPEN/SCCM guidelines  MONITOR:   TF tolerance, Vent status, Labs, Weight trends, Skin, I & O's  REASON FOR ASSESSMENT:   Ventilator    ASSESSMENT:   59 y/o female with h/o DM, COPD, seizures, lung nodules, CAD, anxiety, MDD, bipolar disorder, HLD and substance abuse who is admitted with AMS, COPD, exacerbation, PNA and sepsis.  Pt sedated and ventilated. OGT in place. Plan is for possible extubation today; will plan to initiate tube feeds if pt does not extubate. Pt is at refeed risk. No BM since admission. Per chart, pt appears fairly weight stable pta; pt is at her UBW currently.   Medications reviewed and include: celexa , lovenox , insulin , solu-medrol , ceftriaxone   Labs reviewed: K 5.1 wnl, BUN 27(H), creat 1.14(H), P 4.0 wnl, Mg 2.8(H) Wbc- 11.0(H), Hgb 10.5(L), Hct 33.6(L) Cbgs- 151, 117, 136 x 24 hrs  AIC 6.4(H)- 1/17  Patient is currently intubated on ventilator support MV: 6.5 L/min Temp (24hrs), Avg:98.9 F (37.2 C), Min:97 F (36.1 C), Max:101.3 F (38.5 C)  Propofol : stopped   MAP >46mmHg   UOP-   NUTRITION - FOCUSED PHYSICAL EXAM:  Flowsheet Row Most Recent Value  Orbital Region No depletion  Upper Arm Region No depletion  Thoracic and Lumbar Region No depletion   Buccal Region No depletion  Temple Region No depletion  Clavicle Bone Region No depletion  Clavicle and Acromion Bone Region No depletion  Scapular Bone Region No depletion  Dorsal Hand No depletion  Patellar Region Moderate depletion  Anterior Thigh Region Mild depletion  Posterior Calf Region Mild depletion  Edema (RD Assessment) Mild  Hair Reviewed  Eyes Reviewed  Mouth Reviewed  Skin Reviewed  Nails Reviewed   Diet Order:   Diet Order     None      EDUCATION NEEDS:   No education needs have been identified at this time  Skin:  Skin Assessment: Reviewed RN Assessment  Last BM:  1/19- type 7  Height:   Ht Readings from Last 1 Encounters:  02/29/24 5' 6 (1.676 m)    Weight:   Wt Readings from Last 1 Encounters:  03/03/24 76 kg    Ideal Body Weight:  59 kg  BMI:  Body mass index is 27.04 kg/m.  Estimated Nutritional Needs:   Kcal:  1844kcal/day  Protein:  105-120g/day  Fluid:  1.6-1.8L/day  Augustin Shams MS, RD, LDN If unable to be reached, please send secure chat to RD inpatient available from 8:00a-4:00p daily

## 2024-03-03 NOTE — Plan of Care (Signed)
" °  Problem: Clinical Measurements: Goal: Ability to maintain clinical measurements within normal limits will improve Outcome: Progressing Goal: Will remain free from infection Outcome: Progressing Goal: Diagnostic test results will improve Outcome: Progressing Goal: Respiratory complications will improve Outcome: Progressing Goal: Cardiovascular complication will be avoided Outcome: Progressing   Problem: Activity: Goal: Risk for activity intolerance will decrease Outcome: Progressing   Problem: Nutrition: Goal: Adequate nutrition will be maintained Outcome: Not Progressing   Problem: Coping: Goal: Level of anxiety will decrease Outcome: Progressing   Problem: Elimination: Goal: Will not experience complications related to bowel motility Outcome: Progressing Goal: Will not experience complications related to urinary retention Outcome: Progressing   Problem: Pain Managment: Goal: General experience of comfort will improve and/or be controlled Outcome: Progressing   Problem: Safety: Goal: Ability to remain free from injury will improve Outcome: Progressing   Problem: Skin Integrity: Goal: Risk for impaired skin integrity will decrease Outcome: Progressing   Problem: Fluid Volume: Goal: Ability to maintain a balanced intake and output will improve Outcome: Progressing   Problem: Metabolic: Goal: Ability to maintain appropriate glucose levels will improve Outcome: Progressing   Problem: Nutritional: Goal: Maintenance of adequate nutrition will improve Outcome: Not Progressing Goal: Progress toward achieving an optimal weight will improve Outcome: Not Progressing   Problem: Skin Integrity: Goal: Risk for impaired skin integrity will decrease Outcome: Progressing   Problem: Tissue Perfusion: Goal: Adequacy of tissue perfusion will improve Outcome: Progressing   "

## 2024-03-04 ENCOUNTER — Inpatient Hospital Stay: Payer: MEDICAID

## 2024-03-04 DIAGNOSIS — J9621 Acute and chronic respiratory failure with hypoxia: Secondary | ICD-10-CM

## 2024-03-04 DIAGNOSIS — J9622 Acute and chronic respiratory failure with hypercapnia: Secondary | ICD-10-CM

## 2024-03-04 LAB — BASIC METABOLIC PANEL WITH GFR
Anion gap: 9 (ref 5–15)
BUN: 31 mg/dL — ABNORMAL HIGH (ref 6–20)
CO2: 29 mmol/L (ref 22–32)
Calcium: 9.5 mg/dL (ref 8.9–10.3)
Chloride: 104 mmol/L (ref 98–111)
Creatinine, Ser: 1 mg/dL (ref 0.44–1.00)
GFR, Estimated: >60 mL/min
Glucose, Bld: 167 mg/dL — ABNORMAL HIGH (ref 70–99)
Potassium: 4.9 mmol/L (ref 3.5–5.1)
Sodium: 143 mmol/L (ref 135–145)

## 2024-03-04 LAB — LEGIONELLA PNEUMOPHILA SEROGP 1 UR AG
L. pneumophila Serogp 1 Ur Ag: NEGATIVE
L. pneumophila Serogp 1 Ur Ag: NEGATIVE

## 2024-03-04 LAB — BLOOD GAS, ARTERIAL
Acid-Base Excess: 8.2 mmol/L — ABNORMAL HIGH (ref 0.0–2.0)
Bicarbonate: 35.5 mmol/L — ABNORMAL HIGH (ref 20.0–28.0)
FIO2: 40 %
MECHVT: 450 mL
Mechanical Rate: 20
O2 Saturation: 97.4 %
PEEP: 5 cmH2O
Patient temperature: 37
pCO2 arterial: 60 mmHg — ABNORMAL HIGH (ref 32–48)
pH, Arterial: 7.38 (ref 7.35–7.45)
pO2, Arterial: 89 mmHg (ref 83–108)

## 2024-03-04 LAB — CBC
HCT: 34.6 % — ABNORMAL LOW (ref 36.0–46.0)
Hemoglobin: 11.2 g/dL — ABNORMAL LOW (ref 12.0–15.0)
MCH: 29.8 pg (ref 26.0–34.0)
MCHC: 32.4 g/dL (ref 30.0–36.0)
MCV: 92 fL (ref 80.0–100.0)
Platelets: 272 K/uL (ref 150–400)
RBC: 3.76 MIL/uL — ABNORMAL LOW (ref 3.87–5.11)
RDW: 13.9 % (ref 11.5–15.5)
WBC: 11.3 K/uL — ABNORMAL HIGH (ref 4.0–10.5)
nRBC: 0 % (ref 0.0–0.2)

## 2024-03-04 LAB — GLUCOSE, CAPILLARY
Glucose-Capillary: 189 mg/dL — ABNORMAL HIGH (ref 70–99)
Glucose-Capillary: 139 mg/dL — ABNORMAL HIGH (ref 70–99)
Glucose-Capillary: 134 mg/dL — ABNORMAL HIGH (ref 70–99)
Glucose-Capillary: 136 mg/dL — ABNORMAL HIGH (ref 70–99)
Glucose-Capillary: 143 mg/dL — ABNORMAL HIGH (ref 70–99)
Glucose-Capillary: 150 mg/dL — ABNORMAL HIGH (ref 70–99)

## 2024-03-04 LAB — MAGNESIUM: Magnesium: 2.6 mg/dL — ABNORMAL HIGH (ref 1.7–2.4)

## 2024-03-04 LAB — CULTURE, RESPIRATORY W GRAM STAIN: Culture: NORMAL

## 2024-03-04 LAB — PHOSPHORUS: Phosphorus: 3.1 mg/dL (ref 2.5–4.6)

## 2024-03-04 MED ORDER — SODIUM CHLORIDE 0.9 % IV SOLN: INTRAVENOUS | Status: AC | PRN

## 2024-03-04 MED ORDER — MORPHINE SULFATE (PF) 2 MG/ML IV SOLN: 1.0000 mg | Freq: Once | INTRAVENOUS | Status: AC

## 2024-03-04 MED ORDER — FENTANYL CITRATE (PF) 50 MCG/ML IJ SOSY
PREFILLED_SYRINGE | INTRAMUSCULAR | Status: AC
  Administered 2024-03-04: 50 ug
  Filled 2024-03-04: qty 1

## 2024-03-04 MED ORDER — ROCURONIUM BROMIDE 10 MG/ML (PF) SYRINGE
PREFILLED_SYRINGE | INTRAVENOUS | Status: AC
  Administered 2024-03-04: 100 mg
  Filled 2024-03-04: qty 10

## 2024-03-04 MED ORDER — MORPHINE SULFATE (PF) 2 MG/ML IV SOLN
INTRAVENOUS | Status: AC
  Administered 2024-03-04: 1 mg via INTRAVENOUS
  Filled 2024-03-04: qty 1

## 2024-03-04 MED ORDER — HYDROCOD POLI-CHLORPHE POLI ER 10-8 MG/5ML PO SUER
5.0000 mL | Freq: Two times a day (BID) | ORAL | Status: DC
  Administered 2024-03-04 – 2024-03-11 (×15): 5 mL
  Filled 2024-03-04 (×15): qty 5

## 2024-03-04 MED ORDER — NICOTINE 7 MG/24HR TD PT24
7.0000 mg | MEDICATED_PATCH | Freq: Every day | TRANSDERMAL | Status: DC
  Administered 2024-03-04 – 2024-03-11 (×8): 7 mg via TRANSDERMAL
  Filled 2024-03-04 (×9): qty 1

## 2024-03-04 MED ORDER — FENTANYL CITRATE (PF) 50 MCG/ML IJ SOSY
PREFILLED_SYRINGE | INTRAMUSCULAR | Status: AC
  Filled 2024-03-04: qty 1

## 2024-03-04 MED ORDER — ETOMIDATE 2 MG/ML IV SOLN
INTRAVENOUS | Status: AC
  Administered 2024-03-04: 20 mg
  Filled 2024-03-04: qty 10

## 2024-03-04 NOTE — Progress Notes (Signed)
 PHARMACY CONSULT NOTE - ELECTROLYTES  Pharmacy Consult for Electrolyte Monitoring and Replacement   Recent Labs: Height: 5' 6 (167.6 cm) Weight: 74.5 kg (164 lb 3.9 oz) IBW/kg (Calculated) : 59.3 Estimated Creatinine Clearance: 63.3 mL/min (by C-G formula based on SCr of 1 mg/dL).  Potassium (mmol/L)  Date Value  03/04/2024 4.9  11/12/2013 4.1   Magnesium  (mg/dL)  Date Value  98/79/7973 2.6 (H)   Calcium  (mg/dL)  Date Value  98/79/7973 9.5   Calcium , Total (mg/dL)  Date Value  90/69/7984 9.0   Albumin (g/dL)  Date Value  98/83/7973 4.6  05/12/2020 4.5  11/12/2013 3.5   Phosphorus (mg/dL)  Date Value  98/79/7973 3.1   Sodium (mmol/L)  Date Value  03/04/2024 143  12/06/2022 139  11/12/2013 141    Assessment  Erica Gomez is a 59 y.o. female presenting with respiratory failure. PMH significant for COPD, asthma, CAD, seizures, and bipolar disorder. Pt has been hyperkalemic up to 6.1 on 1/17. Pharmacy has been consulted to monitor and replace electrolytes.  Diet: none  Goal of Therapy: Electrolytes WNL  Plan:  No repletion indicated today Follow K, Mg, and BMP with AM labs  Thank you for allowing pharmacy to be a part of this patient's care.  Belvie Macintosh, PharmD Candidate 03/04/2024 8:09 AM

## 2024-03-04 NOTE — Progress Notes (Signed)
" °  Chaplain On-Call responded to a page from RN Debi who reported that the patient has been re-intubated and the patient's daughter is here.  Chaplain provided spiritual and emotional support for patient's daughter at bedside.  Chaplain Bebe Ardean EMERSON Hershal., BCC  "

## 2024-03-04 NOTE — Plan of Care (Signed)
" °  Problem: Clinical Measurements: Goal: Cardiovascular complication will be avoided Outcome: Progressing   Problem: Nutrition: Goal: Adequate nutrition will be maintained Outcome: Progressing   Problem: Elimination: Goal: Will not experience complications related to bowel motility Outcome: Progressing   Problem: Pain Managment: Goal: General experience of comfort will improve and/or be controlled Outcome: Progressing   Problem: Safety: Goal: Ability to remain free from injury will improve Outcome: Progressing   Problem: Skin Integrity: Goal: Risk for impaired skin integrity will decrease Outcome: Progressing   Problem: Fluid Volume: Goal: Ability to maintain a balanced intake and output will improve Outcome: Progressing   Problem: Skin Integrity: Goal: Risk for impaired skin integrity will decrease Outcome: Progressing   Problem: Tissue Perfusion: Goal: Adequacy of tissue perfusion will improve Outcome: Progressing   "

## 2024-03-04 NOTE — Progress Notes (Signed)
 Pt extubated to HF at 14L, sats 94%, respiratory rate 24/min.

## 2024-03-04 NOTE — Plan of Care (Signed)
 Had to resedate pt and give multiple boluses throughout the shift.   Problem: Education: Goal: Knowledge of General Education information will improve Description: Including pain rating scale, medication(s)/side effects and non-pharmacologic comfort measures Outcome: Not Progressing   Problem: Health Behavior/Discharge Planning: Goal: Ability to manage health-related needs will improve Outcome: Not Progressing   Problem: Clinical Measurements: Goal: Ability to maintain clinical measurements within normal limits will improve Outcome: Not Progressing Goal: Will remain free from infection Outcome: Not Progressing Goal: Diagnostic test results will improve Outcome: Not Progressing Goal: Respiratory complications will improve Outcome: Not Progressing Goal: Cardiovascular complication will be avoided Outcome: Not Progressing   Problem: Activity: Goal: Risk for activity intolerance will decrease Outcome: Not Progressing   Problem: Nutrition: Goal: Adequate nutrition will be maintained Outcome: Not Progressing   Problem: Coping: Goal: Level of anxiety will decrease Outcome: Not Progressing   Problem: Elimination: Goal: Will not experience complications related to bowel motility Outcome: Not Progressing Goal: Will not experience complications related to urinary retention Outcome: Not Progressing   Problem: Pain Managment: Goal: General experience of comfort will improve and/or be controlled Outcome: Not Progressing   Problem: Safety: Goal: Ability to remain free from injury will improve Outcome: Not Progressing   Problem: Skin Integrity: Goal: Risk for impaired skin integrity will decrease Outcome: Not Progressing   Problem: Education: Goal: Ability to describe self-care measures that may prevent or decrease complications (Diabetes Survival Skills Education) will improve Outcome: Not Progressing Goal: Individualized Educational Video(s) Outcome: Not Progressing    Problem: Coping: Goal: Ability to adjust to condition or change in health will improve Outcome: Not Progressing   Problem: Fluid Volume: Goal: Ability to maintain a balanced intake and output will improve Outcome: Not Progressing   Problem: Health Behavior/Discharge Planning: Goal: Ability to identify and utilize available resources and services will improve Outcome: Not Progressing Goal: Ability to manage health-related needs will improve Outcome: Not Progressing   Problem: Metabolic: Goal: Ability to maintain appropriate glucose levels will improve Outcome: Not Progressing   Problem: Nutritional: Goal: Maintenance of adequate nutrition will improve Outcome: Not Progressing Goal: Progress toward achieving an optimal weight will improve Outcome: Not Progressing   Problem: Skin Integrity: Goal: Risk for impaired skin integrity will decrease Outcome: Not Progressing   Problem: Tissue Perfusion: Goal: Adequacy of tissue perfusion will improve Outcome: Not Progressing

## 2024-03-04 NOTE — Progress Notes (Signed)
 Nutrition Follow Up Note   DOCUMENTATION CODES:   Not applicable  INTERVENTION:   Resume Pivot 1.5@30ml /hr and continue increasing by 10ml/hr q 8 hours until goal rate is reached.   Free water  flushes 30ml q4 hours to maintain tube patency   Regimen provides 1800kcal/day, 113g/day protein and 1081ml/day of free water .   Continue thiamine  100mg  daily via tube x 7 days   Pt remains at high refeed risk; recommend monitor potassium, magnesium  and phosphorus labs daily until stable  Daily weights   NUTRITION DIAGNOSIS:   Inadequate oral intake related to inability to eat (pt sedated and ventilated) as evidenced by NPO status. -ongoing   GOAL:   Provide needs based on ASPEN/SCCM guidelines -progressing   MONITOR:   TF tolerance, Vent status, Labs, Weight trends, Skin, I & O's  ASSESSMENT:   59 y/o female with h/o DM, COPD, seizures, lung nodules, CAD, anxiety, MDD, bipolar disorder, HLD and substance abuse who is admitted with AMS, COPD, exacerbation, PNA and sepsis.  Pt extubated this morning but was re-intubated shortly after. OGT replaced and in good position. Will plan to resume tube feeds; pt was tolerating at 18ml/hr prior to extubation. Pt remains at refeed risk. Per chart, pt appears to be at her UBW currently.    Medications reviewed and include: celexa , lovenox , insulin , solu-medrol , thiamine , ceftriaxone , propofol     Labs reviewed: K 4.9 wnl, BUN 31(H), P 3.1 wnl, Mg 2.6(H) Wbc- 11.3(H), Hgb 11.2(L), Hct 34.6(L) Cbgs- 134, 139, 189 x 24 hrs   Patient is currently intubated on ventilator support MV: 8.9 L/min Temp (24hrs), Avg:98.3 F (36.8 C), Min:97 F (36.1 C), Max:101.3 F (38.5 C)  Propofol : 13.61 ml/hr- provides 359kcal/day   MAP >43mmHg   UOP-   Diet Order:    Diet Order     None      EDUCATION NEEDS:   No education needs have been identified at this time  Skin:  Skin Assessment: Reviewed RN Assessment  Last BM:  1/19- type  7  Height:   Ht Readings from Last 1 Encounters:  02/29/24 5' 6 (1.676 m)    Weight:   Wt Readings from Last 1 Encounters:  03/04/24 74.5 kg    Ideal Body Weight:  59 kg  BMI:  Body mass index is 26.51 kg/m.  Estimated Nutritional Needs:   Kcal:  1844kcal/day  Protein:  105-120g/day  Fluid:  1.6-1.8L/day  Augustin Shams MS, RD, LDN If unable to be reached, please send secure chat to RD inpatient available from 8:00a-4:00p daily

## 2024-03-04 NOTE — Progress Notes (Signed)
 "   NAME:  Erica Gomez, MRN:  [id number], DOB:  Feb 17, 1965, LOS: 4 ADMISSION DATE:  02/29/2024, CONSULTATION DATE:  02/29/24 REFERRING MD:  [name] CHIEF COMPLAINT: Acute Respiratory Distress   HPI  59 y/o F with PMH sig for asthma, COPD, tobacco use disorder, bipolar disorder, seizure, polysubstance abuse, anxiety/depression, and HLD presented to ED BIB [facility] from home for acute respiratory distress.  On Chart review, patient follows with pulmonologist; last visit with Dr. Isadora on 10/25. Meds prescribed at visit included fluticasone  furoate (Arnuity Ellipta ) 200 mcg 1 puff daily and umeclidinium-vilanterol (Anoro Ellipta ) 62.5/25 mcg 1 puff daily. Per husband, patient has been taking medications as prescribed. EMS tx included MgSO? 2 g IV, Solu-Medrol  125 mg IV, DuoNeb x1, and albuterol  x1. During transport, pt became combative/confused and received Haldol 4 mg IM and Versed  5 mg IM. Initially A&O with EMS. SpO? 94% on RA.  ED Course: Initial VS: BP 211/120, HR 142, SpO? 94% on RA. Patient was unresponsive with labored/heaving respirations and in severe resp distress. Concern for hypercapnic resp failure contributing to agitation and AMS. Pt subsequently lost ability to protect airway. Given worsening MS and resp failure, decision made for emergent intubation. Pt received ketamine  and rocuronium  and was intubated successfully without complication.  Labs/Imaging: Labs: WBC 10.7, Hgb 12.7, Hct 40.9, Plt 313. BMP: Na 141, K 4.9, Cl 106, CO? 25, BUN 14, Cr 0.81, Glu 147, Ca 8.9. LFTs WNL: AST 23, ALT 20, Alk Phos 90, Tbili <0.2, Alb 4.6. VBG c/w resp acidosis/hypercapnia (HCO? 29.8), O? sat 98.3. Lactate 1.7. CXR: no acute cardiopulm process; ETT in mid trachea, enteric tube in stomach. CT head ordered and pending for AMS.  03/04/24- patient passed SBT and was extubated but after 1 hour started to have respiratory distress with severe hypoxemia. Her daughter was present and requested  re-intubation. We continue to treat her underlying Asthma COPD exacerbation with strep pneumoniae respiratory infection.    Past Medical History  Asthma COPD Tobacco use d/o Bipolar Disorder Seizure Disorder Polysubstance abuse Anxiety/depression HLD Significant Hospital Events   1/16: Admitted to ICU for asthma/COPD overlap exacerbation with hypercapnic respiratory failure requiring emergent intubation. 03/01/24- patient failed SBT today, remains on mV. She has AKI.   Strep pneumoniae +, will continue Rocephin .  Husband at bedside I met with him and we reviewed medical plan.  03/02/24- patient had resp distress overnight.  She still has AKI but there is an interval improvement.  03/03/24- patient for SBT today.  AKI has improved this am.  She requires heavy sedation. She has family coming in this afternoon who wishes to be present for SBT.   Procedures:  1/16:Endotrachael Intubation  Interim History / Subjective:    -see significant events  Micro Data:  02/29/24 SARS-CoV-2 PCR ? 02/29/24 Flu PCR ? 02/29/24 RVP ? 02/29/24 BCx2 ? 02/29/24 UCx ? 02/29/24 MRSA PCR ? 02/29/24 Strep pneumo Ur Ag ? 02/29/24 Legionella Ur Ag ?  Antimicrobials:    OBJECTIVE  Blood pressure (!) 225/121, pulse 98, temperature 97.7 F (36.5 C), resp. rate (!) 24, height 5' 6 (1.676 m), weight 74.5 kg, SpO2 100%.    Vent Mode: PSV FiO2 (%):  [28 %-55 %] 55 % Set Rate:  [8 bmp-22 bmp] 8 bmp Vt Set:  [450 mL] 450 mL PEEP:  [5 cmH20] 5 cmH20 Pressure Support:  [5 cmH20] 5 cmH20 Plateau Pressure:  [25 cmH20-26 cmH20] 26 cmH20   Intake/Output Summary (Last 24 hours) at 03/04/2024 1033 Last  data filed at 03/04/2024 0913 Gross per 24 hour  Intake 1466.29 ml  Output 920 ml  Net 546.29 ml   Filed Weights   03/01/24 0500 03/03/24 0500 03/04/24 0400  Weight: 75.6 kg 76 kg 74.5 kg    Physical Examination  GEN: Critically ill patient, intubated and sedated HEENT: [place]/AT. PERRL, sclerae  anicteric. HEART: regular rhythm, normal rate, S1, S2, no M/R/G,  LUNGS: CTAB, diminished bilateral without wheezes, no increased WOB,  EXTREMITIES: No Edema, cap refill  NEURO: No gross focal deficits. PSYCH:  UTA ABDOMINAL: Soft: BS x 4, NTND SKIN: Intact, warm, no rashes lesion, or ulcer  RESULTS  Strep pneumoniae urinary antigen (not at [facility]) Order: [id number]  Status: Final result     Next appt: 06/09/2024 at 09:00 AM in Pulmonology ([facility])   Test Result Released: Yes (not seen)   0 Result Notes    Component Ref Range & Units (hover) 1 d ago  Strep Pneumo Urinary Antigen POSITIVE Abnormal          Assessment & Plan:   #Acute hypoxemic Hypercapnic Respiratory Failure  -strep pneumoniae induced AECOPD -c/w rocephin /steroids -lasix  20 x1 today Hx of Severe asthma/COPD overlap; hypercapnia, AMS; smoker on Arnuity, Anoro -PRVC vent: FiO? 35%, Vt 450 mL, RR 22, PEEP 5; titrate per ABG -IV methylprednisolone  -Neb ipratropium-albuterol  q4h + PRN q6h -propofol  + fentanyl , RASS -1 -PRN ABG &Xray -Nicotine  replacement therapy  Sepsis - due to pneumonia with strep pneumoniae   Present on admission     Continue rocephin  IV   #AMS septic encephalopathy due to pneumonia Multifactorial (hypercapnia, hypoxia, psych dz, ICU stress); CT head neg -Titrate sedation to RASS -1 -Daily neuro checks + delirium screen  #Seizure Disorder Hx seizures, no recent events -Continue lamotrigine  via tube -Seizure precautions -PRN benzo if breakthrough -Avoid tramadol/other seizure-lowering meds -Monitor metabolic/electrolyte derangements    #Hypertension #Hyperlipidemia Chronic, poorly controlled; ED BP 211/120 ? 128/89 -Treat per ICU protocol if persistent -Resume home anti-HTN when stable -resume rosuvastatin  once enteral access  #Psych (Anxiety/Depression/Bipolar) -Resume home citalopram , Atarax , Adderall PO -Psychiatry consult if agitation persists  post-extubation  #Prediabetes / Glycemic Control HbA1c 6.4-6.7%; CBG 147-177 -Check glucose q4-6h -SSI  -Goal 140-180 mg/dL -Hold metformin  while NPO -Early enteral nutrition when feasible  #Polysubstance / Tobacco Use Active smoker, past polysubstance abuse -UDS +Amphetamine /on Adderall at home -Nicotine  replacement therapy -Counseling/support post-extubation -Avoid sedatives that worsen withdrawal  Best practice:  Diet:  NPO Pain/Anxiety/Delirium protocol (if indicated): Yes (RASS goal -1) VAP protocol (if indicated): Yes DVT prophylaxis: LMWH GI prophylaxis: H2B Glucose control:  SSI No Central venous access:  N/A Arterial line:  N/A Foley:  N/A Mobility:  bed rest  PT/OT consulted: N/A Code Status:  full code Disposition: ICU   = Goals of Care =  Primary Emergency Contact: Penland,John   Labs/imaging that I havepersonally reviewed  (right click and Reselect all SmartList Selections daily)   DG Chest Port 1 View Result Date: 03/04/2024 EXAM: 1 VIEW(S) XRAY OF THE CHEST 03/04/2024 10:00:00 AM COMPARISON: 03/03/2024 CLINICAL HISTORY: 08790 Atelectasis 91209 91209 Atelectasis 91209 91209 Atelectasis 91209 Atelectasis 91209 Atelectasis 91209 FINDINGS: LINES, TUBES AND DEVICES: Endotracheal and enteric tubes removed. LUNGS AND PLEURA: Minor bibasilar atelectasis. No pleural effusion. No pneumothorax. HEART AND MEDIASTINUM: No acute abnormality of the cardiac and mediastinal silhouettes. BONES AND SOFT TISSUES: No acute osseous abnormality. IMPRESSION: 1. Minimal bibasilar opacities likely reflecting atelectasis, decreased from prior. 2. Interval removal of endotracheal and enteric tubes.  Electronically signed by: Donnice Mania MD 03/04/2024 10:23 AM EST RP Workstation: [id number]   DG Chest Port 1 View Result Date: 03/03/2024 CLINICAL DATA:  Pneumonia. EXAM: PORTABLE CHEST 1 VIEW COMPARISON:  Radiographs 03/02/2024 and 02/29/2024. Chest CT 06/09/2020. FINDINGS: 1014 hours.  The endotracheal and enteric tubes appear unchanged, tip of the latter not visualized. The heart size and mediastinal contours are stable. There is improved aeration of both lung bases with mild residual atelectasis. No confluent airspace disease, pneumothorax or significant pleural effusion. The bones appear unchanged. IMPRESSION: Improved aeration of both lung bases with mild residual atelectasis. No new findings. Electronically Signed   By: Elsie Perone M.D.   On: 03/03/2024 16:13   DG Chest Port 1 View Result Date: 03/02/2024 EXAM: 1 VIEW(S) XRAY OF THE CHEST 03/02/2024 02:38:00 PM COMPARISON: 02/29/2024 CLINICAL HISTORY: Infiltrate of both lungs present on imaging study. FINDINGS: LINES, TUBES AND DEVICES: Endotracheal tube in place with tip 6.5 cm above the carina. Enteric tube in place with tip coursing below the diaphragm with tip and side port colimated off view. LUNGS AND PLEURA: Increased bibasilar patchy opacities. Trace bilateral pleural effusions. No pneumothorax. HEART AND MEDIASTINUM: No acute abnormality of the cardiac and mediastinal silhouettes. BONES AND SOFT TISSUES: No acute osseous abnormality. IMPRESSION: 1. Increased bibasilar patchy opacities. 2. Trace bilateral pleural effusions. 3. Lines and tubes as above. Electronically signed by: Kate Plummer MD 03/02/2024 03:37 PM EST RP Workstation: [id number]     Labs   CBC: Recent Labs  Lab 02/29/24 1833 03/01/24 0201 03/02/24 0326 03/03/24 0346 03/04/24 0234  WBC 10.7* 6.0 11.6* 11.0* 11.3*  NEUTROABS 5.2  --   --   --   --   HGB 12.7 12.2 11.6* 10.5* 11.2*  HCT 40.9 38.6 37.7 33.6* 34.6*  MCV 94.9 92.6 95.4 93.3 92.0  PLT 313 286 292 260 272    Basic Metabolic Panel: Recent Labs  Lab 03/01/24 0201 03/01/24 0614 03/01/24 2208 03/02/24 0326 03/02/24 1252 03/03/24 0346 03/04/24 0234  NA 138   < > 140 138 138 140 143  K 5.6*   < > 5.0 5.7* 5.4* 5.1 4.9  CL 102   < > 103 102 104 103 104  CO2 22   < > 25 23 26  28 29   GLUCOSE 189*   < > 137* 180* 127* 154* 167*  BUN 19   < > 33* 33* 29* 27* 31*  CREATININE 1.55*   < > 2.37* 2.26* 1.55* 1.14* 1.00  CALCIUM  9.1   < > 9.0 9.0 9.4 9.0 9.5  MG 3.4*  --   --  3.2*  --  2.8* 2.6*  PHOS 3.7  --   --  5.9*  --  4.0 3.1   < > = values in this interval not displayed.   GFR: Estimated Creatinine Clearance: 63.3 mL/min (by C-G formula based on SCr of 1 mg/dL). Recent Labs  Lab 02/29/24 1834 02/29/24 2018 03/01/24 0201 03/02/24 0326 03/03/24 0346 03/04/24 0234  PROCALCITON  --  <0.10  --   --   --   --   WBC  --   --  6.0 11.6* 11.0* 11.3*  LATICACIDVEN 1.7 1.7  --   --   --   --     Liver Function Tests: Recent Labs  Lab 02/29/24 1833  AST 23  ALT 20  ALKPHOS 90  BILITOT <0.2  PROT 7.1  ALBUMIN 4.6   No results for input(s): LIPASE, AMYLASE in  the last 168 hours. No results for input(s): AMMONIA in the last 168 hours.  ABG    Component Value Date/Time   PHART 7.34 (L) 03/01/2024 0432   PCO2ART 47 03/01/2024 0432   PO2ART 92 03/01/2024 0432   HCO3 25.4 03/01/2024 0432   ACIDBASEDEF 0.8 03/01/2024 0432   O2SAT 98.6 03/01/2024 0432     Coagulation Profile: No results for input(s): INR, PROTIME in the last 168 hours.  Cardiac Enzymes: No results for input(s): CKTOTAL, CKMB, CKMBINDEX, TROPONINI in the last 168 hours.  HbA1C: Hemoglobin A1C  Date/Time Value Ref Range Status  09/21/2021 02:00 PM 6.4 (A) 4.0 - 5.6 % Final  03/22/2021 09:52 AM 6.2 (A) 4.0 - 5.6 % Final  01/15/2014 12:00 AM 6.8  Final   HbA1c, POC (prediabetic range)  Date/Time Value Ref Range Status  05/11/2020 10:30 AM 0 (A) 5.7 - 6.4 % Final   HbA1c, POC (controlled diabetic range)  Date/Time Value Ref Range Status  05/11/2020 10:30 AM 0.0 0.0 - 7.0 % Final   HbA1c POC (<> result, manual entry)  Date/Time Value Ref Range Status  05/11/2020 10:30 AM 6.5 4.0 - 5.6 % Final   Hgb A1c MFr Bld  Date/Time Value Ref Range Status  03/01/2024  02:02 AM 6.4 (H) 4.8 - 5.6 % Final    Comment:    (NOTE) Diagnosis of Diabetes The following HbA1c ranges recommended by the American Diabetes Association (ADA) may be used as an aid in the diagnosis of diabetes mellitus.  Hemoglobin             Suggested A1C NGSP%              Diagnosis  <5.7                   Non Diabetic  5.7-6.4                Pre-Diabetic  >6.4                   Diabetic  <7.0                   Glycemic control for                       adults with diabetes.    12/24/2019 11:08 AM 6.7 (H) 4.8 - 5.6 % Final    Comment:             Prediabetes: 5.7 - 6.4          Diabetes: >6.4          Glycemic control for adults with diabetes: <7.0    CBG: Recent Labs  Lab 03/03/24 1611 03/03/24 1912 03/03/24 2329 03/04/24 0324 03/04/24 0808  GLUCAP 151* 126* 110* 189* 139*    Review of Systems:   Unable to be obtained secondary to the patient's intubated and sedated status.   Past Medical History  She,  has a past medical history of Allergy, Anxiety, Asthma, Bilateral carpal tunnel syndrome (12/31/2018), Bipolar affective, manic (HCC) (1996), COPD (chronic obstructive pulmonary disease) (HCC), Depression, HLD (hyperlipidemia) (03/12/2018), Lung nodule, multiple (09/26/2016), Manic depressive illness (HCC) (1996), Seizures (HCC), Substance abuse (HCC), and Tobacco abuse (10/04/2016).   Surgical History    Past Surgical History:  Procedure Laterality Date   ABDOMINAL HYSTERECTOMY     CARPAL TUNNEL RELEASE Right 01/15/2019   Procedure: RIGHTCARPAL TUNNEL RELEASE, LEFT DEQUERVAINS INJECTION;  Surgeon: Jerri Kay HERO,  MD;  Location: [facility];  Service: Orthopedics;  Laterality: Right;   CARPAL TUNNEL RELEASE Left 02/26/2019   Procedure: LEFT CARPAL TUNNEL RELEASE;  Surgeon: Jerri Kay HERO, MD;  Location: [facility];  Service: Orthopedics;  Laterality: Left;  Bier block   COLONOSCOPY WITH PROPOFOL  N/A 11/28/2018   Procedure: COLONOSCOPY  WITH PROPOFOL ;  Surgeon: Therisa Bi, MD;  Location: [facility];  Service: Gastroenterology;  Laterality: N/A;   TUBAL LIGATION       Social History   reports that she has been smoking cigarettes. Her smokeless tobacco use includes chew and snuff. She reports that she does not currently use alcohol. She reports that she does not use drugs.   Family History   Her family history includes Bipolar disorder in her brother and brother; CAD in her father; Heart attack in her father; Other in her mother; Schizophrenia in her brother and brother; Stroke in her mother.   Allergies Allergies[1]   Home Medications  Prior to Admission medications  Medication Sig Start Date End Date Taking? Authorizing Provider  albuterol  (PROVENTIL  HFA) 108 (90 Base) MCG/ACT inhaler Inhale 2 puffs into the lungs every 4-6 hours as needed. 09/21/21   Iloabachie, Chioma E, NP  amphetamine-dextroamphetamine (ADDERALL XR) 10 MG 24 hr capsule Take 10 mg by mouth daily.    [provider]  citalopram  (CELEXA ) 40 MG tablet Take 1 tablet (40 mg total) by mouth daily. 11/21/22     Fluticasone  Furoate (ARNUITY ELLIPTA ) 200 MCG/ACT AEPB Inhale 1 puff into the lungs daily. 12/06/23   Isadora Hose, MD  hydrOXYzine  (ATARAX ) 50 MG tablet Take 1-1.5 tablets (50-75 mg total) by mouth at bedtime as needed for sleep 11/21/22     ipratropium-albuterol  (DUONEB) 0.5-2.5 (3) MG/3ML SOLN INHALE CONTENTS OF ONE VIAL ( TOTAL) USING NEBULIZER  ONCE EVERY 6 HOURS. 11/02/20 12/06/23  Iloabachie, Chioma E, NP  lamoTRIgine  (LAMICTAL ) 100 MG tablet Take 1 tablet (100 mg total) by mouth 2 (two) times daily. 06/23/20   Gayland Lauraine PARAS, NP  lamoTRIgine  (LAMICTAL ) 100 MG tablet Take 1 tablet (100 mg total) by mouth daily. 11/21/22     meloxicam  (MOBIC ) 15 MG tablet Take 15 mg by mouth daily.    [provider]  metFORMIN  (GLUCOPHAGE ) 500 MG tablet TAKE ONE TABLET BY MOUTH TWICE DAILY WITH MEALS. 09/21/21   Iloabachie, Chioma E, NP   methocarbamol  (ROBAXIN ) 500 MG tablet Take 500 mg by mouth at bedtime.    [provider]  nicotine  polacrilex (NICOTINE  MINI) 2 MG lozenge Take 1 lozenge (2 mg total) by mouth every 2 (two) hours as needed for smoking cessation. 12/06/23 03/05/24  Isadora Hose, MD  rosuvastatin  (CRESTOR ) 10 MG tablet Take 10 mg by mouth at bedtime. 08/13/23   [provider]  traMADol (ULTRAM) 50 MG tablet Take 1 tablet by mouth every 6 (six) hours as needed.    [provider]  triamcinolone  cream (KENALOG ) 0.1 % Apply 1 Application topically 2 (two) times daily. 09/21/21   Iloabachie, Chioma E, NP  umeclidinium-vilanterol (ANORO ELLIPTA ) 62.5-25 MCG/ACT AEPB Inhale 1 puff into the lungs daily. 12/06/23   Isadora Hose, MD  atomoxetine  (STRATTERA ) 25 MG capsule Take 1 capsule (25 mg total) by mouth once every evening after dinner. Patient not taking: Reported on 12/23/2020 10/26/20 03/09/21    loratadine  (CLARITIN ) 10 MG tablet Take 1 tablet (10 mg total) by mouth daily as needed for allergies. 12/16/19 12/25/19  Iloabachie, Chioma E, NP  Scheduled Meds:  Chlorhexidine  Gluconate Cloth  6 each Topical Daily   citalopram   40 mg Per Tube Daily   enoxaparin  (LOVENOX ) injection  40 mg Subcutaneous Q24H   free water   30 mL Per Tube Q4H   insulin  aspart  0-15 Units Subcutaneous Q4H   ipratropium-albuterol   3 mL Nebulization Q4H   lamoTRIgine   100 mg Per Tube Daily   methylPREDNISolone  (SOLU-MEDROL ) injection  30 mg Intravenous Q12H   nitroGLYCERIN   1 inch Topical Q8H   mouth rinse  15 mL Mouth Rinse Q2H   thiamine   100 mg Per Tube Daily   Continuous Infusions:  cefTRIAXone  (ROCEPHIN )  IV Stopped (03/04/24 0229)   dexmedetomidine  (PRECEDEX ) IV infusion 1.2 mcg/kg/hr (03/04/24 0913)   feeding supplement (PIVOT 1.5 CAL) 30 mL/hr at 03/04/24 0913   fentaNYL  infusion INTRAVENOUS Stopped (03/04/24 0859)   propofol  (DIPRIVAN ) infusion 10 mcg/kg/min (03/04/24 0913)   PRN  Meds:.acetaminophen , albuterol , fentaNYL , lip balm, midazolam  PF, mouth rinse, polyethylene glycol, senna  Critical care provider statement:   Total critical care time: 33 minutes   Performed by: Parris MD   Critical care time was exclusive of separately billable procedures and treating other patients.   Critical care was necessary to treat or prevent imminent or life-threatening deterioration.   Critical care was time spent personally by me on the following activities: development of treatment plan with patient and/or surrogate as well as nursing, discussions with consultants, evaluation of patient's response to treatment, examination of patient, obtaining history from patient or surrogate, ordering and performing treatments and interventions, ordering and review of laboratory studies, ordering and review of radiographic studies, pulse oximetry and re-evaluation of patient's condition.    Jonea Bukowski, M.D.  Pulmonary & Critical Care Medicine             [1]  Allergies Allergen Reactions   Flax Seed Oil [Flax Seed Oil] Hives    Pt. Self reported   Other Hives    Pt. Self reported   Trazodone Other (See Comments)    Bed wetting  Other Reaction(s): Other (See Comments)    Bed wetting   "

## 2024-03-04 NOTE — Procedures (Signed)
 Intubation Procedure Note  Erica Gomez  [id number]  01-11-1966  Date:03/04/24  Time:10:55 AM   Provider Performing:Ferlin Fairhurst D Shellia    Procedure: Intubation (31500)  Indication(s) Respiratory Failure  Consent Unable to obtain consent due to emergent nature of procedure.   Anesthesia Etomidate , Fentanyl , and Rocuronium    Time Out Verified patient identification, verified procedure, site/side was marked, verified correct patient position, special equipment/implants available, medications/allergies/relevant history reviewed, required imaging and test results available.   Sterile Technique Usual hand hygeine, masks, and gloves were used   Procedure Description Patient positioned in bed supine.  Sedation given as noted above.  Patient was intubated with endotracheal tube using Glidescope.  View was Grade 1 full glottis .  Number of attempts was 1.  Colorimetric CO2 detector was consistent with tracheal placement.   Complications/Tolerance None; patient tolerated the procedure well. Chest X-ray is ordered to verify placement.   EBL Minimal   Specimen(s) None    Size 7.5 ETT Tube secured at 23 cm at lip.    Inge Shellia, AGACNP-BC [facility] Prefer epic messenger for cross cover needs If after hours, please call E-link

## 2024-03-05 ENCOUNTER — Inpatient Hospital Stay (HOSPITAL_COMMUNITY)
Admit: 2024-03-05 | Discharge: 2024-03-05 | Disposition: A | Discharge status: Home / Self Care | Payer: MEDICAID | Attending: Critical Care Medicine | Admitting: Critical Care Medicine

## 2024-03-05 ENCOUNTER — Inpatient Hospital Stay: Payer: MEDICAID

## 2024-03-05 DIAGNOSIS — R0603 Acute respiratory distress: Secondary | ICD-10-CM

## 2024-03-05 LAB — CBC
HCT: 33.9 % — ABNORMAL LOW (ref 36.0–46.0)
Hemoglobin: 10.5 g/dL — ABNORMAL LOW (ref 12.0–15.0)
MCH: 29.1 pg (ref 26.0–34.0)
MCHC: 31 g/dL (ref 30.0–36.0)
MCV: 93.9 fL (ref 80.0–100.0)
Platelets: 283 K/uL (ref 150–400)
RBC: 3.61 MIL/uL — ABNORMAL LOW (ref 3.87–5.11)
RDW: 13.7 % (ref 11.5–15.5)
WBC: 10.3 K/uL (ref 4.0–10.5)
nRBC: 0 % (ref 0.0–0.2)

## 2024-03-05 LAB — BASIC METABOLIC PANEL WITH GFR
Anion gap: 8 (ref 5–15)
BUN: 26 mg/dL — ABNORMAL HIGH (ref 6–20)
CO2: 28 mmol/L (ref 22–32)
Calcium: 9.3 mg/dL (ref 8.9–10.3)
Chloride: 106 mmol/L (ref 98–111)
Creatinine, Ser: 0.86 mg/dL (ref 0.44–1.00)
GFR, Estimated: >60 mL/min
Glucose, Bld: 178 mg/dL — ABNORMAL HIGH (ref 70–99)
Potassium: 4.7 mmol/L (ref 3.5–5.1)
Sodium: 141 mmol/L (ref 135–145)

## 2024-03-05 LAB — GLUCOSE, CAPILLARY
Glucose-Capillary: 173 mg/dL — ABNORMAL HIGH (ref 70–99)
Glucose-Capillary: 139 mg/dL — ABNORMAL HIGH (ref 70–99)
Glucose-Capillary: 161 mg/dL — ABNORMAL HIGH (ref 70–99)
Glucose-Capillary: 132 mg/dL — ABNORMAL HIGH (ref 70–99)

## 2024-03-05 LAB — CULTURE, BLOOD (ROUTINE X 2)
Culture: NO GROWTH
Culture: NO GROWTH
Special Requests: ADEQUATE

## 2024-03-05 LAB — ECHOCARDIOGRAM COMPLETE
Height: 66 in
S' Lateral: 2.6 cm
Weight: 2627.88 [oz_av]

## 2024-03-05 LAB — PHOSPHORUS: Phosphorus: 3 mg/dL (ref 2.5–4.6)

## 2024-03-05 LAB — TRIGLYCERIDES: Triglycerides: 175 mg/dL — ABNORMAL HIGH

## 2024-03-05 LAB — PROCALCITONIN: Procalcitonin: <0.1 ng/mL

## 2024-03-05 LAB — MAGNESIUM: Magnesium: 2.3 mg/dL (ref 1.7–2.4)

## 2024-03-05 MED ORDER — FAMOTIDINE IN NACL 20-0.9 MG/50ML-% IV SOLN
20.0000 mg | Freq: Two times a day (BID) | INTRAVENOUS | Status: DC
  Administered 2024-03-05 – 2024-03-08 (×6): 20 mg via INTRAVENOUS
  Filled 2024-03-05 (×6): qty 50

## 2024-03-05 MED ORDER — VECURONIUM BROMIDE 10 MG IV SOLR
10.0000 mg | Freq: Once | INTRAVENOUS | Status: AC
  Administered 2024-03-05: 10 mg via INTRAVENOUS

## 2024-03-05 MED ORDER — DIPHENHYDRAMINE HCL 50 MG/ML IJ SOLN
50.0000 mg | Freq: Four times a day (QID) | INTRAMUSCULAR | Status: DC | PRN
  Administered 2024-03-05: 50 mg via INTRAVENOUS
  Filled 2024-03-05: qty 1

## 2024-03-05 MED ORDER — SODIUM CHLORIDE 0.9 % IV SOLN
2.0000 g | INTRAVENOUS | Status: DC
  Administered 2024-03-05: 2 g via INTRAVENOUS
  Filled 2024-03-05 (×2): qty 20

## 2024-03-05 MED ORDER — STERILE WATER FOR INJECTION IJ SOLN
INTRAMUSCULAR | Status: AC
  Filled 2024-03-05: qty 10

## 2024-03-05 MED ORDER — SODIUM CHLORIDE 0.9 % IV SOLN
2.0000 g | Freq: Three times a day (TID) | INTRAVENOUS | Status: DC
  Administered 2024-03-06 – 2024-03-08 (×8): 2 g via INTRAVENOUS
  Filled 2024-03-05 (×9): qty 12.5

## 2024-03-05 MED ORDER — FAMOTIDINE IN NACL 20-0.9 MG/50ML-% IV SOLN: 20.0000 mg | INTRAVENOUS | Status: DC

## 2024-03-05 MED ORDER — VECURONIUM BROMIDE 10 MG IV SOLR
INTRAVENOUS | Status: AC
  Filled 2024-03-05: qty 10

## 2024-03-05 NOTE — Progress Notes (Signed)
 "   NAME:  Erica Gomez, MRN:  [id number], DOB:  Jun 09, 1965, LOS: 5 ADMISSION DATE:  02/29/2024, CONSULTATION DATE:  02/29/24 REFERRING MD:  Erica Gomez CHIEF COMPLAINT: Acute Respiratory Distress   HPI  59 y/o F with PMH sig for asthma, COPD, tobacco use disorder, bipolar disorder, seizure, polysubstance abuse, anxiety/depression, and HLD presented to ED BIB [facility] from home for acute respiratory distress.  On Chart review, patient follows with pulmonologist; last visit with Dr. Isadora on 10/25. Meds prescribed at visit included fluticasone  furoate (Arnuity Ellipta ) 200 mcg 1 puff daily and umeclidinium-vilanterol (Anoro Ellipta ) 62.5/25 mcg 1 puff daily. Per husband, patient has been taking medications as prescribed. EMS tx included MgSO? 2 g IV, Solu-Medrol  125 mg IV, DuoNeb x1, and albuterol  x1. During transport, pt became combative/confused and received Haldol 4 mg IM and Versed  5 mg IM. Initially A&O with EMS. SpO? 94% on RA.  ED Course: Initial VS: BP 211/120, HR 142, SpO? 94% on RA. Patient was unresponsive with labored/heaving respirations and in severe resp distress. Concern for hypercapnic resp failure contributing to agitation and AMS. Pt subsequently lost ability to protect airway. Given worsening MS and resp failure, decision made for emergent intubation. Pt received ketamine  and rocuronium  and was intubated successfully without complication.  Labs/Imaging: Labs: WBC 10.7, Hgb 12.7, Hct 40.9, Plt 313. BMP: Na 141, K 4.9, Cl 106, CO? 25, BUN 14, Cr 0.81, Glu 147, Ca 8.9. LFTs WNL: AST 23, ALT 20, Alk Phos 90, Tbili <0.2, Alb 4.6. VBG c/w resp acidosis/hypercapnia (HCO? 29.8), O? sat 98.3. Lactate 1.7. CXR: no acute cardiopulm process; ETT in mid trachea, enteric tube in stomach. CT head ordered and pending for AMS.  03/04/24- patient passed SBT and was extubated but after 1 hour started to have respiratory distress with severe hypoxemia. Her daughter was present and requested  re-intubation. We continue to treat her underlying Asthma COPD exacerbation with strep pneumoniae respiratory infection.   03/05/24- Patient failed SBT today with tachypnea/tachycardia/resp distress  Past Medical History  Asthma COPD Tobacco use d/o Bipolar Disorder Seizure Disorder Polysubstance abuse Anxiety/depression HLD Significant Hospital Events   1/16: Admitted to ICU for asthma/COPD overlap exacerbation with hypercapnic respiratory failure requiring emergent intubation. 03/01/24- patient failed SBT today, remains on mV. She has AKI.   Strep pneumoniae +, will continue Rocephin .  Husband at bedside I met with him and we reviewed medical plan.  03/02/24- patient had resp distress overnight.  She still has AKI but there is an interval improvement.  03/03/24- patient for SBT today.  AKI has improved this am.  She requires heavy sedation. She has family coming in this afternoon who wishes to be present for SBT.   Procedures:  1/16:Endotrachael Intubation  Interim History / Subjective:    -see significant events  Micro Data:  02/29/24 SARS-CoV-2 PCR ? 02/29/24 Flu PCR ? 02/29/24 RVP ? 02/29/24 BCx2 ? 02/29/24 UCx ? 02/29/24 MRSA PCR ? 02/29/24 Strep pneumo Ur Ag ? 02/29/24 Legionella Ur Ag ?  Antimicrobials:    OBJECTIVE  Blood pressure (!) 154/86, pulse (!) 105, temperature (!) 101.8 F (38.8 C), temperature source Bladder, resp. rate 13, height 5' 6 (1.676 m), weight 74.5 kg, SpO2 98%.    Vent Mode: PRVC FiO2 (%):  [30 %-40 %] 40 % Set Rate:  [20 bmp] 20 bmp Vt Set:  [450 mL-480 mL] 480 mL PEEP:  [5 cmH20] 5 cmH20 Pressure Support:  [8 cmH20] 8 cmH20 Plateau Pressure:  [32 cmH20-33 cmH20] 33  cmH20   Intake/Output Summary (Last 24 hours) at 03/05/2024 1350 Last data filed at 03/05/2024 1338 Gross per 24 hour  Intake 2741.53 ml  Output 1285 ml  Net 1456.53 ml   Filed Weights   03/01/24 0500 03/03/24 0500 03/04/24 0400  Weight: 75.6 kg 76 kg 74.5 kg     Physical Examination  GEN: Critically ill patient, intubated and sedated HEENT: [place]/AT. PERRL, sclerae anicteric. HEART: regular rhythm, normal rate, S1, S2, no M/R/G,  LUNGS: CTAB, diminished bilateral without wheezes, no increased WOB,  EXTREMITIES: No Edema, cap refill  NEURO: No gross focal deficits. PSYCH:  UTA ABDOMINAL: Soft: BS x 4, NTND SKIN: Intact, warm, no rashes lesion, or ulcer  RESULTS  Strep pneumoniae urinary antigen (not at [facility]) Order: [id number]  Status: Final result     Next appt: 06/09/2024 at 09:00 AM in Pulmonology ([facility])   Test Result Released: Yes (not seen)   0 Result Notes    Component Ref Range & Units (hover) 1 d ago  Strep Pneumo Urinary Antigen POSITIVE Abnormal          Assessment & Plan:   #Acute hypoxemic Hypercapnic Respiratory Failure  -strep pneumoniae induced AECOPD -c/w rocephin /steroids -lasix  20 x1 today Hx of Severe asthma/COPD overlap; hypercapnia, AMS; smoker on Arnuity, Anoro -PRVC vent: FiO? 35%, Vt 450 mL, RR 22, PEEP 5; titrate per ABG -IV methylprednisolone  -Neb ipratropium-albuterol  q4h + PRN q6h -propofol  + fentanyl , RASS -1 -PRN ABG &Xray -Nicotine  replacement therapy  Sepsis - due to pneumonia with strep pneumoniae   Present on admission     Continue rocephin  IV   #AMS septic encephalopathy due to pneumonia Multifactorial (hypercapnia, hypoxia, psych dz, ICU stress); CT head neg -Titrate sedation to RASS -1 -Daily neuro checks + delirium screen  #Seizure Disorder Hx seizures, no recent events -Continue lamotrigine  via tube -Seizure precautions -PRN benzo if breakthrough -Avoid tramadol/other seizure-lowering meds -Monitor metabolic/electrolyte derangements    #Hypertension #Hyperlipidemia Chronic, poorly controlled; ED BP 211/120 ? 128/89 -Treat per ICU protocol if persistent -Resume home anti-HTN when stable -resume rosuvastatin  once enteral access  #Psych  (Anxiety/Depression/Bipolar) -Resume home citalopram , Atarax , Adderall PO -Psychiatry consult if agitation persists post-extubation  #Prediabetes / Glycemic Control HbA1c 6.4-6.7%; CBG 147-177 -Check glucose q4-6h -SSI  -Goal 140-180 mg/dL -Hold metformin  while NPO -Early enteral nutrition when feasible  #Polysubstance / Tobacco Use Active smoker, past polysubstance abuse -UDS +Amphetamine /on Adderall at home -Nicotine  replacement therapy -Counseling/support post-extubation -Avoid sedatives that worsen withdrawal  Best practice:  Diet:  NPO Pain/Anxiety/Delirium protocol (if indicated): Yes (RASS goal -1) VAP protocol (if indicated): Yes DVT prophylaxis: LMWH GI prophylaxis: H2B Glucose control:  SSI No Central venous access:  N/A Arterial line:  N/A Foley:  N/A Mobility:  bed rest  PT/OT consulted: N/A Code Status:  full code Disposition: ICU   = Goals of Care =  Primary Emergency Contact: Higbie,John   Labs/imaging that I havepersonally reviewed  (right click and Reselect all SmartList Selections daily)   DG Chest Port 1 View Result Date: 03/05/2024 CLINICAL DATA:  Acute respiratory failure. EXAM: PORTABLE CHEST 1 VIEW COMPARISON:  Chest radiograph dated 03/04/2024. FINDINGS: Endotracheal tube above the carina in similar position. Enteric tube extends below the diaphragm with tip beyond the inferior margin of the image. Mild diffuse hazy density in the right lung and interstitial prominence may be related to superimposition of the right breast. Asymmetric edema or pneumonia are less likely. No consolidative changes. No pleural effusion pneumothorax.  Stable cardiac silhouette. No acute osseous pathology. IMPRESSION: 1. Endotracheal tube above the carina in similar position. 2. No focal consolidation. Electronically Signed   By: Vanetta Chou M.D.   On: 03/05/2024 11:07   DG Abd 1 View Result Date: 03/04/2024 CLINICAL DATA:  NG placement. EXAM: ABDOMEN - 1 VIEW  COMPARISON:  Radiograph dated 02/29/2024. FINDINGS: Enteric tube with tip in the left hemiabdomen likely in the body of the stomach. No bowel dilatation. Air is noted in the colon. No acute osseous pathology. IMPRESSION: Enteric tube with tip in the body of the stomach. Electronically Signed   By: Vanetta Chou M.D.   On: 03/04/2024 13:12   DG Chest Port 1 View Result Date: 03/04/2024 CLINICAL DATA:  Status post intubation and OG tube placement. EXAM: PORTABLE CHEST 1 VIEW COMPARISON:  March 04, 2024 (9:58 a.m.) FINDINGS: Since the prior study there is been interval placement of an endotracheal tube with its distal tip approximately 7.4 cm from the carina. Interval enteric tube placement is also seen with its distal tip just beyond the expected region of the gastroesophageal junction. The distal side hole is approximately 4.7 cm proximal to the gastroesophageal junction. The heart size and mediastinal contours are within normal limits. Mild left perihilar airspace disease is suspected. No pleural effusion or pneumothorax is identified. No acute osseous abnormalities are identified. IMPRESSION: 1. Interval endotracheal tube and enteric tube placement, as described above. Advancement of the enteric tube by approximately 10 cm is recommended to decrease the risk of aspiration. 2. Mild left perihilar airspace disease. Electronically Signed   By: Suzen Dials M.D.   On: 03/04/2024 11:32   DG Chest Port 1 View Result Date: 03/04/2024 EXAM: 1 VIEW(S) XRAY OF THE CHEST 03/04/2024 10:00:00 AM COMPARISON: 03/03/2024 CLINICAL HISTORY: 08790 Atelectasis 91209 91209 Atelectasis 91209 91209 Atelectasis 91209 Atelectasis 91209 Atelectasis 91209 FINDINGS: LINES, TUBES AND DEVICES: Endotracheal and enteric tubes removed. LUNGS AND PLEURA: Minor bibasilar atelectasis. No pleural effusion. No pneumothorax. HEART AND MEDIASTINUM: No acute abnormality of the cardiac and mediastinal silhouettes. BONES AND SOFT TISSUES: No  acute osseous abnormality. IMPRESSION: 1. Minimal bibasilar opacities likely reflecting atelectasis, decreased from prior. 2. Interval removal of endotracheal and enteric tubes. Electronically signed by: Donnice Mania MD 03/04/2024 10:23 AM EST RP Workstation: [id number]     Labs   CBC: Recent Labs  Lab 02/29/24 1833 03/01/24 0201 03/02/24 0326 03/03/24 0346 03/04/24 0234 03/05/24 0420  WBC 10.7* 6.0 11.6* 11.0* 11.3* 10.3  NEUTROABS 5.2  --   --   --   --   --   HGB 12.7 12.2 11.6* 10.5* 11.2* 10.5*  HCT 40.9 38.6 37.7 33.6* 34.6* 33.9*  MCV 94.9 92.6 95.4 93.3 92.0 93.9  PLT 313 286 292 260 272 283    Basic Metabolic Panel: Recent Labs  Lab 03/01/24 0201 03/01/24 0614 03/02/24 0326 03/02/24 1252 03/03/24 0346 03/04/24 0234 03/05/24 0420  NA 138   < > 138 138 140 143 141  K 5.6*   < > 5.7* 5.4* 5.1 4.9 4.7  CL 102   < > 102 104 103 104 106  CO2 22   < > 23 26 28 29 28   GLUCOSE 189*   < > 180* 127* 154* 167* 178*  BUN 19   < > 33* 29* 27* 31* 26*  CREATININE 1.55*   < > 2.26* 1.55* 1.14* 1.00 0.86  CALCIUM  9.1   < > 9.0 9.4 9.0 9.5 9.3  MG 3.4*  --  3.2*  --  2.8* 2.6* 2.3  PHOS 3.7  --  5.9*  --  4.0 3.1 3.0   < > = values in this interval not displayed.   GFR: Estimated Creatinine Clearance: 73.6 mL/min (by C-G formula based on SCr of 0.86 mg/dL). Recent Labs  Lab 02/29/24 1834 02/29/24 2018 03/01/24 0201 03/02/24 0326 03/03/24 0346 03/04/24 0234 03/05/24 0420  PROCALCITON  --  <0.10  --   --   --   --  <0.10  WBC  --   --    < > 11.6* 11.0* 11.3* 10.3  LATICACIDVEN 1.7 1.7  --   --   --   --   --    < > = values in this interval not displayed.    Liver Function Tests: Recent Labs  Lab 02/29/24 1833  AST 23  ALT 20  ALKPHOS 90  BILITOT <0.2  PROT 7.1  ALBUMIN 4.6   No results for input(s): LIPASE, AMYLASE in the last 168 hours. No results for input(s): AMMONIA in the last 168 hours.  ABG    Component Value Date/Time   PHART 7.38  03/04/2024 1256   PCO2ART 60 (H) 03/04/2024 1256   PO2ART 89 03/04/2024 1256   HCO3 35.5 (H) 03/04/2024 1256   ACIDBASEDEF 0.8 03/01/2024 0432   O2SAT 97.4 03/04/2024 1256     Coagulation Profile: No results for input(s): INR, PROTIME in the last 168 hours.  Cardiac Enzymes: No results for input(s): CKTOTAL, CKMB, CKMBINDEX, TROPONINI in the last 168 hours.  HbA1C: Hemoglobin A1C  Date/Time Value Ref Range Status  09/21/2021 02:00 PM 6.4 (A) 4.0 - 5.6 % Final  03/22/2021 09:52 AM 6.2 (A) 4.0 - 5.6 % Final  01/15/2014 12:00 AM 6.8  Final   HbA1c, POC (prediabetic range)  Date/Time Value Ref Range Status  05/11/2020 10:30 AM 0 (A) 5.7 - 6.4 % Final   HbA1c, POC (controlled diabetic range)  Date/Time Value Ref Range Status  05/11/2020 10:30 AM 0.0 0.0 - 7.0 % Final   HbA1c POC (<> result, manual entry)  Date/Time Value Ref Range Status  05/11/2020 10:30 AM 6.5 4.0 - 5.6 % Final   Hgb A1c MFr Bld  Date/Time Value Ref Range Status  03/01/2024 02:02 AM 6.4 (H) 4.8 - 5.6 % Final    Comment:    (NOTE) Diagnosis of Diabetes The following HbA1c ranges recommended by the American Diabetes Association (ADA) may be used as an aid in the diagnosis of diabetes mellitus.  Hemoglobin             Suggested A1C NGSP%              Diagnosis  <5.7                   Non Diabetic  5.7-6.4                Pre-Diabetic  >6.4                   Diabetic  <7.0                   Glycemic control for                       adults with diabetes.    12/24/2019 11:08 AM 6.7 (H) 4.8 - 5.6 % Final    Comment:             Prediabetes:  5.7 - 6.4          Diabetes: >6.4          Glycemic control for adults with diabetes: <7.0    CBG: Recent Labs  Lab 03/04/24 1933 03/04/24 2328 03/05/24 0425 03/05/24 0716 03/05/24 1210  GLUCAP 143* 150* 173* 139* 161*    Review of Systems:   Unable to be obtained secondary to the patient's intubated and sedated status.   Past Medical  History  She,  has a past medical history of Allergy, Anxiety, Asthma, Bilateral carpal tunnel syndrome (12/31/2018), Bipolar affective, manic (HCC) (1996), COPD (chronic obstructive pulmonary disease) (HCC), Depression, HLD (hyperlipidemia) (03/12/2018), Lung nodule, multiple (09/26/2016), Manic depressive illness (HCC) (1996), Seizures (HCC), Substance abuse (HCC), and Tobacco abuse (10/04/2016).   Surgical History    Past Surgical History:  Procedure Laterality Date   ABDOMINAL HYSTERECTOMY     CARPAL TUNNEL RELEASE Right 01/15/2019   Procedure: RIGHTCARPAL TUNNEL RELEASE, LEFT DEQUERVAINS INJECTION;  Surgeon: Jerri Kay HERO, MD;  Location: [facility];  Service: Orthopedics;  Laterality: Right;   CARPAL TUNNEL RELEASE Left 02/26/2019   Procedure: LEFT CARPAL TUNNEL RELEASE;  Surgeon: Jerri Kay HERO, MD;  Location: [facility];  Service: Orthopedics;  Laterality: Left;  Bier block   COLONOSCOPY WITH PROPOFOL  N/A 11/28/2018   Procedure: COLONOSCOPY WITH PROPOFOL ;  Surgeon: Therisa Bi, MD;  Location[facility];  Service: Gastroenterology;  Laterality: N/A;   TUBAL LIGATION       Social History   reports that she has been smoking cigarettes. Her smokeless tobacco use includes chew and snuff. She reports that she does not currently use alcohol. She reports that she does not use drugs.   Family History   Her family history includes Bipolar disorder in her brother and brother; CAD in her father; Heart attack in her father; Other in her mother; Schizophrenia in her brother and brother; Stroke in her mother.   Allergies Allergies[1]   Home Medications  Prior to Admission medications  Medication Sig Start Date End Date Taking? Authorizing Provider  albuterol  (PROVENTIL  HFA) 108 (90 Base) MCG/ACT inhaler Inhale 2 puffs into the lungs every 4-6 hours as needed. 09/21/21   Iloabachie, Chioma E, NP  amphetamine-dextroamphetamine (ADDERALL XR) 10 MG 24 hr capsule Take 10  mg by mouth daily.    [provider]  citalopram  (CELEXA ) 40 MG tablet Take 1 tablet (40 mg total) by mouth daily. 11/21/22     Fluticasone  Furoate (ARNUITY ELLIPTA ) 200 MCG/ACT AEPB Inhale 1 puff into the lungs daily. 12/06/23   Erica Gomez Hose, MD  hydrOXYzine  (ATARAX ) 50 MG tablet Take 1-1.5 tablets (50-75 mg total) by mouth at bedtime as needed for sleep 11/21/22     ipratropium-albuterol  (DUONEB) 0.5-2.5 (3) MG/3ML SOLN INHALE CONTENTS OF ONE VIAL ( TOTAL) USING NEBULIZER  ONCE EVERY 6 HOURS. 11/02/20 12/06/23  Iloabachie, Chioma E, NP  lamoTRIgine  (LAMICTAL ) 100 MG tablet Take 1 tablet (100 mg total) by mouth 2 (two) times daily. 06/23/20   Gayland Lauraine PARAS, NP  lamoTRIgine  (LAMICTAL ) 100 MG tablet Take 1 tablet (100 mg total) by mouth daily. 11/21/22     meloxicam  (MOBIC ) 15 MG tablet Take 15 mg by mouth daily.    [provider]  metFORMIN  (GLUCOPHAGE ) 500 MG tablet TAKE ONE TABLET BY MOUTH TWICE DAILY WITH MEALS. 09/21/21   Iloabachie, Chioma E, NP  methocarbamol  (ROBAXIN ) 500 MG tablet Take 500 mg by mouth at bedtime.    [provider]  nicotine  polacrilex (NICOTINE  MINI) 2 MG lozenge Take 1 lozenge (2 mg total) by mouth every 2 (two) hours as needed for smoking cessation. 12/06/23 03/05/24  Erica Gomez Hose, MD  rosuvastatin  (CRESTOR ) 10 MG tablet Take 10 mg by mouth at bedtime. 08/13/23   [provider]  traMADol (ULTRAM) 50 MG tablet Take 1 tablet by mouth every 6 (six) hours as needed.    [provider]  triamcinolone  cream (KENALOG ) 0.1 % Apply 1 Application topically 2 (two) times daily. 09/21/21   Iloabachie, Chioma E, NP  umeclidinium-vilanterol (ANORO ELLIPTA ) 62.5-25 MCG/ACT AEPB Inhale 1 puff into the lungs daily. 12/06/23   Erica Gomez Hose, MD  atomoxetine  (STRATTERA ) 25 MG capsule Take 1 capsule (25 mg total) by mouth once every evening after dinner. Patient not taking: Reported on 12/23/2020 10/26/20 03/09/21    loratadine  (CLARITIN ) 10 MG  tablet Take 1 tablet (10 mg total) by mouth daily as needed for allergies. 12/16/19 12/25/19  Iloabachie, Chioma E, NP   Scheduled Meds:  Chlorhexidine  Gluconate Cloth  6 each Topical Daily   chlorpheniramine-HYDROcodone   5 mL Per Tube Q12H   citalopram   40 mg Per Tube Daily   enoxaparin  (LOVENOX ) injection  40 mg Subcutaneous Q24H   free water   30 mL Per Tube Q4H   insulin  aspart  0-15 Units Subcutaneous Q4H   ipratropium-albuterol   3 mL Nebulization Q4H   lamoTRIgine   100 mg Per Tube Daily   methylPREDNISolone  (SOLU-MEDROL ) injection  30 mg Intravenous Q12H   nicotine   7 mg Transdermal Daily   mouth rinse  15 mL Mouth Rinse Q2H   thiamine   100 mg Per Tube Daily   Continuous Infusions:  sodium chloride      cefTRIAXone  (ROCEPHIN )  IV Stopped (03/05/24 0146)   dexmedetomidine  (PRECEDEX ) IV infusion 0.5 mcg/kg/hr (03/05/24 1240)   feeding supplement (PIVOT 1.5 CAL) 50 mL/hr at 03/05/24 1240   fentaNYL  infusion INTRAVENOUS 25 mcg/hr (03/05/24 1348)   propofol  (DIPRIVAN ) infusion 70 mcg/kg/min (03/05/24 1338)   PRN Meds:.sodium chloride , acetaminophen , albuterol , fentaNYL , lip balm, midazolam  PF, mouth rinse, polyethylene glycol, senna  Critical care provider statement:   Total critical care time: 33 minutes   Performed by: Parris MD   Critical care time was exclusive of separately billable procedures and treating other patients.   Critical care was necessary to treat or prevent imminent or life-threatening deterioration.   Critical care was time spent personally by me on the following activities: development of treatment plan with patient and/or surrogate as well as nursing, discussions with consultants, evaluation of patient's response to treatment, examination of patient, obtaining history from patient or surrogate, ordering and performing treatments and interventions, ordering and review of laboratory studies, ordering and review of radiographic studies, pulse oximetry and  re-evaluation of patient's condition.    Jahmire Ruffins, M.D.  Pulmonary & Critical Care Medicine              [1]  Allergies Allergen Reactions   Flax Seed Oil [Flax Seed Oil] Hives    Pt. Self reported   Other Hives    Pt. Self reported   Trazodone Other (See Comments)    Bed wetting  Other Reaction(s): Other (See Comments)    Bed wetting   "

## 2024-03-05 NOTE — Plan of Care (Signed)

## 2024-03-05 NOTE — Progress Notes (Signed)
 PHARMACY CONSULT NOTE - ELECTROLYTES  Pharmacy Consult for Electrolyte Monitoring and Replacement   Recent Labs: Height: 5' 6 (167.6 cm) Weight: 74.5 kg (164 lb 3.9 oz) IBW/kg (Calculated) : 59.3 Estimated Creatinine Clearance: 73.6 mL/min (by C-G formula based on SCr of 0.86 mg/dL).  Potassium (mmol/L)  Date Value  03/05/2024 4.7  11/12/2013 4.1   Magnesium  (mg/dL)  Date Value  98/78/7973 2.3   Calcium  (mg/dL)  Date Value  98/78/7973 9.3   Calcium , Total (mg/dL)  Date Value  90/69/7984 9.0   Albumin (g/dL)  Date Value  98/83/7973 4.6  05/12/2020 4.5  11/12/2013 3.5   Phosphorus (mg/dL)  Date Value  98/78/7973 3.0   Sodium (mmol/L)  Date Value  03/05/2024 141  12/06/2022 139  11/12/2013 141    Assessment  Erica Gomez is a 59 y.o. female presenting with respiratory failure. PMH significant for COPD, asthma, CAD, seizures, and bipolar disorder. Pt has been hyperkalemic up to 6.1 on 1/17. Pharmacy has been consulted to monitor and replace electrolytes.  Diet: Tube feeds @ 51mL/hr + free water  30mL Q4H  Goal of Therapy: Electrolytes WNL  Plan:  No repletion indicated today Follow K, Mg, and BMP with AM labs  Thank you for allowing pharmacy to be a part of this patient's care.  Belvie Macintosh, PharmD Candidate 03/05/2024 7:55 AM

## 2024-03-05 NOTE — Progress Notes (Signed)
 Patient has new rash on her right side from her right back to her right hip. She also has a rash on her left back. Made ICU NP aware.

## 2024-03-05 NOTE — Plan of Care (Signed)
" °  Problem: Clinical Measurements: Goal: Diagnostic test results will improve Outcome: Progressing   Problem: Nutrition: Goal: Adequate nutrition will be maintained Outcome: Progressing   Problem: Elimination: Goal: Will not experience complications related to bowel motility Outcome: Progressing   Problem: Safety: Goal: Ability to remain free from injury will improve Outcome: Progressing   Problem: Skin Integrity: Goal: Risk for impaired skin integrity will decrease Outcome: Progressing   "

## 2024-03-05 NOTE — Progress Notes (Signed)
 Updated pts spouse at bedside regarding pts condition and current plan of care.  All questions were answered and he appreciated receiving an update   Lonell Moose, [facility]  Pulmonary/Critical Care Pager [phone] (please enter 7 digits) PCCM Consult Pager [phone] (please enter 7 digits)

## 2024-03-05 NOTE — Progress Notes (Signed)
 NG tube set to LIS due to pt possibly vomiting. 400 ml output of feed/bile. Pt given 1 dose of Vec d/t pt fighting vent with low volumes. Benadryl /Pepcid  given for new rash.

## 2024-03-06 ENCOUNTER — Inpatient Hospital Stay: Payer: MEDICAID

## 2024-03-06 LAB — BLOOD GAS, VENOUS
Acid-Base Excess: 4.2 mmol/L — ABNORMAL HIGH (ref 0.0–2.0)
Bicarbonate: 30.3 mmol/L — ABNORMAL HIGH (ref 20.0–28.0)
FIO2: 30 %
MECHVT: 450 mL
O2 Saturation: 80.6 %
PEEP: 5 cmH2O
Patient temperature: 37
RATE: 20 {breaths}/min
pCO2, Ven: 50 mmHg (ref 44–60)
pH, Ven: 7.39 (ref 7.25–7.43)
pO2, Ven: 44 mmHg (ref 32–45)

## 2024-03-06 LAB — CBC
HCT: 32.1 % — ABNORMAL LOW (ref 36.0–46.0)
Hemoglobin: 10.1 g/dL — ABNORMAL LOW (ref 12.0–15.0)
MCH: 30 pg (ref 26.0–34.0)
MCHC: 31.5 g/dL (ref 30.0–36.0)
MCV: 95.3 fL (ref 80.0–100.0)
Platelets: 264 K/uL (ref 150–400)
RBC: 3.37 MIL/uL — ABNORMAL LOW (ref 3.87–5.11)
RDW: 13.9 % (ref 11.5–15.5)
WBC: 10.5 K/uL (ref 4.0–10.5)
nRBC: 0 % (ref 0.0–0.2)

## 2024-03-06 LAB — BASIC METABOLIC PANEL WITH GFR
Anion gap: 8 (ref 5–15)
BUN: 25 mg/dL — ABNORMAL HIGH (ref 6–20)
CO2: 25 mmol/L (ref 22–32)
Calcium: 8.3 mg/dL — ABNORMAL LOW (ref 8.9–10.3)
Chloride: 102 mmol/L (ref 98–111)
Creatinine, Ser: 0.72 mg/dL (ref 0.44–1.00)
GFR, Estimated: >60 mL/min
Glucose, Bld: 120 mg/dL — ABNORMAL HIGH (ref 70–99)
Potassium: 4.6 mmol/L (ref 3.5–5.1)
Sodium: 135 mmol/L (ref 135–145)

## 2024-03-06 LAB — MAGNESIUM: Magnesium: 2.2 mg/dL (ref 1.7–2.4)

## 2024-03-06 LAB — PROCALCITONIN: Procalcitonin: <0.1 ng/mL

## 2024-03-06 LAB — GLUCOSE, CAPILLARY
Glucose-Capillary: 146 mg/dL — ABNORMAL HIGH (ref 70–99)
Glucose-Capillary: 94 mg/dL (ref 70–99)
Glucose-Capillary: 126 mg/dL — ABNORMAL HIGH (ref 70–99)
Glucose-Capillary: 102 mg/dL — ABNORMAL HIGH (ref 70–99)
Glucose-Capillary: 117 mg/dL — ABNORMAL HIGH (ref 70–99)
Glucose-Capillary: 106 mg/dL — ABNORMAL HIGH (ref 70–99)
Glucose-Capillary: 88 mg/dL (ref 70–99)
Glucose-Capillary: 111 mg/dL — ABNORMAL HIGH (ref 70–99)

## 2024-03-06 LAB — PHOSPHORUS: Phosphorus: 3.4 mg/dL (ref 2.5–4.6)

## 2024-03-06 MED ORDER — VECURONIUM BROMIDE 10 MG IV SOLR
INTRAVENOUS | Status: AC
  Filled 2024-03-06: qty 10

## 2024-03-06 MED ORDER — DIPHENHYDRAMINE HCL 50 MG/ML IJ SOLN
25.0000 mg | Freq: Four times a day (QID) | INTRAMUSCULAR | Status: AC
  Administered 2024-03-06 (×4): 25 mg via INTRAVENOUS
  Filled 2024-03-06 (×4): qty 1

## 2024-03-06 MED ORDER — PIVOT 1.5 CAL PO LIQD
1000.0000 mL | ORAL | Status: DC
  Administered 2024-03-06 – 2024-03-07 (×2): 1000 mL
  Filled 2024-03-06 (×2): qty 1000

## 2024-03-06 MED ORDER — VECURONIUM BROMIDE 10 MG IV SOLR
10.0000 mg | Freq: Once | INTRAVENOUS | Status: AC
  Administered 2024-03-06: 10 mg via INTRAVENOUS

## 2024-03-06 MED ORDER — VECURONIUM BROMIDE 10 MG IV SOLR
10.0000 mg | Freq: Once | INTRAVENOUS | Status: AC
  Administered 2024-03-06: 10 mg via INTRAVENOUS
  Filled 2024-03-06: qty 10

## 2024-03-06 MED ORDER — IPRATROPIUM-ALBUTEROL 0.5-2.5 (3) MG/3ML IN SOLN
3.0000 mL | Freq: Four times a day (QID) | RESPIRATORY_TRACT | Status: DC
  Administered 2024-03-06 – 2024-03-07 (×4): 3 mL via RESPIRATORY_TRACT
  Filled 2024-03-06 (×4): qty 3

## 2024-03-06 NOTE — Progress Notes (Addendum)
 PHARMACY CONSULT NOTE - ELECTROLYTES  Pharmacy Consult for Electrolyte Monitoring and Replacement   Recent Labs: Height: 5' 6 (167.6 cm) Weight: 74.5 kg (164 lb 3.9 oz) IBW/kg (Calculated) : 59.3 Estimated Creatinine Clearance: 79.1 mL/min (by C-G formula based on SCr of 0.72 mg/dL).  Potassium (mmol/L)  Date Value  03/06/2024 4.6  11/12/2013 4.1   Magnesium  (mg/dL)  Date Value  98/77/7973 2.2   Calcium  (mg/dL)  Date Value  98/77/7973 8.3 (L)   Calcium , Total (mg/dL)  Date Value  90/69/7984 9.0   Albumin (g/dL)  Date Value  98/83/7973 4.6  05/12/2020 4.5  11/12/2013 3.5   Phosphorus (mg/dL)  Date Value  98/77/7973 3.4   Sodium (mmol/L)  Date Value  03/06/2024 135  12/06/2022 139  11/12/2013 141    Assessment  Erica Gomez is a 59 y.o. female presenting with respiratory failure. PMH significant for COPD, asthma, CAD, seizures, and bipolar disorder. Pt has been hyperkalemic up to 6.1 on 1/17. Pharmacy has been consulted to monitor and replace electrolytes.  Diet: Tube feeds @ 37mL/hr >> held last night + free water  30mL Q4H  Goal of Therapy: Electrolytes WNL  Plan:  No repletion indicated today Follow K, Mg, and BMP with AM labs  Thank you for allowing pharmacy to be a part of this patient's care.  Belvie Macintosh, PharmD Candidate 03/06/2024 7:53 AM

## 2024-03-06 NOTE — Progress Notes (Signed)
 "   NAME:  Erica Gomez, MRN:  [id number], DOB:  1965-12-26, LOS: 6 ADMISSION DATE:  02/29/2024, CONSULTATION DATE:  02/29/24 REFERRING MD:  [name] CHIEF COMPLAINT: Acute Respiratory Distress   HPI  59 y/o F with PMH sig for asthma, COPD, tobacco use disorder, bipolar disorder, seizure, polysubstance abuse, anxiety/depression, and HLD presented to ED BIB [facility] from home for acute respiratory distress.  On Chart review, patient follows with pulmonologist; last visit with Dr. Isadora on 10/25. Meds prescribed at visit included fluticasone  furoate (Arnuity Ellipta ) 200 mcg 1 puff daily and umeclidinium-vilanterol (Anoro Ellipta ) 62.5/25 mcg 1 puff daily. Per husband, patient has been taking medications as prescribed. EMS tx included MgSO? 2 g IV, Solu-Medrol  125 mg IV, DuoNeb x1, and albuterol  x1. During transport, pt became combative/confused and received Haldol 4 mg IM and Versed  5 mg IM. Initially A&O with EMS. SpO? 94% on RA.  ED Course: Initial VS: BP 211/120, HR 142, SpO? 94% on RA. Patient was unresponsive with labored/heaving respirations and in severe resp distress. Concern for hypercapnic resp failure contributing to agitation and AMS. Pt subsequently lost ability to protect airway. Given worsening MS and resp failure, decision made for emergent intubation. Pt received ketamine  and rocuronium  and was intubated successfully without complication.  Labs/Imaging: Labs: WBC 10.7, Hgb 12.7, Hct 40.9, Plt 313. BMP: Na 141, K 4.9, Cl 106, CO? 25, BUN 14, Cr 0.81, Glu 147, Ca 8.9. LFTs WNL: AST 23, ALT 20, Alk Phos 90, Tbili <0.2, Alb 4.6. VBG c/w resp acidosis/hypercapnia (HCO? 29.8), O? sat 98.3. Lactate 1.7. CXR: no acute cardiopulm process; ETT in mid trachea, enteric tube in stomach. CT head ordered and pending for AMS.  03/04/24- patient passed SBT and was extubated but after 1 hour started to have respiratory distress with severe hypoxemia. Her daughter was present and requested  re-intubation. We continue to treat her underlying Asthma COPD exacerbation with strep pneumoniae respiratory infection.   03/05/24- Patient failed SBT today with tachypnea/tachycardia/resp distress 03/06/24- Patient failed SBT and husband was present during evaluation, she was tachypneic and tachycardic.   Past Medical History  Asthma COPD Tobacco use d/o Bipolar Disorder Seizure Disorder Polysubstance abuse Anxiety/depression HLD Significant Hospital Events   1/16: Admitted to ICU for asthma/COPD overlap exacerbation with hypercapnic respiratory failure requiring emergent intubation. 03/01/24- patient failed SBT today, remains on mV. She has AKI.   Strep pneumoniae +, will continue Rocephin .  Husband at bedside I met with him and we reviewed medical plan.  03/02/24- patient had resp distress overnight.  She still has AKI but there is an interval improvement.  03/03/24- patient for SBT today.  AKI has improved this am.  She requires heavy sedation. She has family coming in this afternoon who wishes to be present for SBT.   Procedures:  1/16:Endotrachael Intubation  Interim History / Subjective:    -see significant events  Micro Data:  02/29/24 SARS-CoV-2 PCR ? 02/29/24 Flu PCR ? 02/29/24 RVP ? 02/29/24 BCx2 ? 02/29/24 UCx ? 02/29/24 MRSA PCR ? 02/29/24 Strep pneumo Ur Ag ? 02/29/24 Legionella Ur Ag ?  Antimicrobials:    OBJECTIVE  Blood pressure 113/71, pulse 76, temperature 98.7 F (37.1 C), temperature source Axillary, resp. rate 20, height 5' 6 (1.676 m), weight 74.5 kg, SpO2 98%.    Vent Mode: PRVC FiO2 (%):  [30 %-40 %] 30 % Set Rate:  [20 bmp] 20 bmp Vt Set:  [480 mL] 480 mL PEEP:  [5 cmH20] 5 cmH20 Pressure Support:  [  8 cmH20] 8 cmH20 Plateau Pressure:  [33 cmH20] 33 cmH20   Intake/Output Summary (Last 24 hours) at 03/06/2024 0848 Last data filed at 03/06/2024 0557 Gross per 24 hour  Intake 1835.17 ml  Output 1275 ml  Net 560.17 ml   Filed Weights    03/03/24 0500 03/04/24 0400 03/06/24 0451  Weight: 76 kg 74.5 kg 74.5 kg    Physical Examination  GEN: Critically ill patient, intubated and sedated HEENT: [place]/AT. PERRL, sclerae anicteric. HEART: regular rhythm, normal rate, S1, S2, no M/R/G,  LUNGS: CTAB, diminished bilateral without wheezes, no increased WOB,  EXTREMITIES: No Edema, cap refill  NEURO: No gross focal deficits. PSYCH:  UTA ABDOMINAL: Soft: BS x 4, NTND SKIN: Intact, warm, no rashes lesion, or ulcer  RESULTS  Strep pneumoniae urinary antigen (not at [facility]) Order: [id number]  Status: Final result     Next appt: 06/09/2024 at 09:00 AM in Pulmonology ([facility])   Test Result Released: Yes (not seen)   0 Result Notes    Component Ref Range & Units (hover) 1 d ago  Strep Pneumo Urinary Antigen POSITIVE Abnormal          Assessment & Plan:   #Acute hypoxemic Hypercapnic Respiratory Failure  -strep pneumoniae induced AECOPD -c/w rocephin /steroids -lasix  20 x1 today Hx of Severe asthma/COPD overlap; hypercapnia, AMS; smoker on Arnuity, Anoro -PRVC vent: FiO? 35%, Vt 450 mL, RR 22, PEEP 5; titrate per ABG -IV methylprednisolone  -Neb ipratropium-albuterol  q4h + PRN q6h -propofol  + fentanyl , RASS -1 -PRN ABG &Xray -Nicotine  replacement therapy  Sepsis - due to pneumonia with strep pneumoniae   Present on admission     Continue rocephin  IV   #AMS septic encephalopathy due to pneumonia Multifactorial (hypercapnia, hypoxia, psych dz, ICU stress); CT head neg -Titrate sedation to RASS -1 -Daily neuro checks + delirium screen  #Seizure Disorder Hx seizures, no recent events -Continue lamotrigine  via tube -Seizure precautions -PRN benzo if breakthrough -Avoid tramadol/other seizure-lowering meds -Monitor metabolic/electrolyte derangements    #Hypertension #Hyperlipidemia Chronic, poorly controlled; ED BP 211/120 ? 128/89 -Treat per ICU protocol if persistent -Resume home anti-HTN when  stable -resume rosuvastatin  once enteral access  #Psych (Anxiety/Depression/Bipolar) -Resume home citalopram , Atarax , Adderall PO -Psychiatry consult if agitation persists post-extubation  #Prediabetes / Glycemic Control HbA1c 6.4-6.7%; CBG 147-177 -Check glucose q4-6h -SSI  -Goal 140-180 mg/dL -Hold metformin  while NPO -Early enteral nutrition when feasible  #Polysubstance / Tobacco Use Active smoker, past polysubstance abuse -UDS +Amphetamine /on Adderall at home -Nicotine  replacement therapy -Counseling/support post-extubation -Avoid sedatives that worsen withdrawal  Best practice:  Diet:  NPO Pain/Anxiety/Delirium protocol (if indicated): Yes (RASS goal -1) VAP protocol (if indicated): Yes DVT prophylaxis: LMWH GI prophylaxis: H2B Glucose control:  SSI No Central venous access:  N/A Arterial line:  N/A Foley:  N/A Mobility:  bed rest  PT/OT consulted: N/A Code Status:  full code Disposition: ICU   = Goals of Care =  Primary Emergency Contact: Zehring,John   Labs/imaging that I havepersonally reviewed  (right click and Reselect all SmartList Selections daily)   DG Abd 1 View Result Date: 03/06/2024 CLINICAL DATA:  OG tube placement EXAM: ABDOMEN - 1 VIEW COMPARISON:  03/05/2024 FINDINGS: Enteric tube tip and side port overlies the proximal stomach. Mild crinkled appearance of the tube distal to the side port. Visible gas pattern is unobstructed IMPRESSION: Enteric tube tip and side port overlie the proximal stomach. Electronically Signed   By: Luke Bun M.D.   On: 03/06/2024 01:55  DG Abd 1 View Result Date: 03/05/2024 EXAM: 1 VIEW XRAY OF THE ABDOMEN 03/05/2024 08:21:00 PM COMPARISON: 03/04/2024. CLINICAL HISTORY: [id number] Encounter for orogastric (OG) tube placement [id number] Encounter for orogastric (OG) tube placement FINDINGS: LINES, TUBES AND DEVICES: OG tube tip is in the stomach, stable since the prior study. BOWEL: Nonobstructive bowel gas pattern. SOFT  TISSUES: No abnormal calcifications. BONES: No acute fracture. IMPRESSION: 1. OG tube tip is in the stomach, stable since the prior study. Electronically signed by: Franky Crease MD 03/05/2024 08:32 PM EST RP Workstation: [id number]   ECHOCARDIOGRAM COMPLETE Result Date: 03/05/2024    ECHOCARDIOGRAM REPORT   Patient Name:   CLEATUS GOODIN Date of Exam: 03/05/2024 Medical Rec #:  [id number]       Height:       66.0 in Accession #:    [id number]      Weight:       164.2 lb Date of Birth:  11/16/1965       BSA:          1.839 m Patient Age:    58 years        BP:           152/86 mmHg Patient Gender: F               HR:           103 bpm. Exam Location:  [facility] Procedure: 2D Echo, Cardiac Doppler and Color Doppler (Both Spectral and Color            Flow Doppler were utilized during procedure). Indications:     Acute respiratory distress  History:         Patient has no prior history of Echocardiogram examinations.                  CAD, COPD; Risk Factors:Diabetes and Dyslipidemia.  Sonographer:     Philomena Daring Referring Phys:  [id number] LONELL KANDICE MOOSE Diagnosing Phys: Evalene Lunger MD  Sonographer Comments: Image acquisition challenging due to respiratory motion. IMPRESSIONS  1. Left ventricular ejection fraction, by estimation, is 60 to 65%. Left ventricular ejection fraction by PLAX is 63 %. The left ventricle has normal function. The left ventricle has no regional wall motion abnormalities. Left ventricular diastolic parameters were normal.  2. Right ventricular systolic function is normal. The right ventricular size is normal.  3. The mitral valve is normal in structure. No evidence of mitral valve regurgitation. No evidence of mitral stenosis.  4. The aortic valve is normal in structure. Aortic valve regurgitation is not visualized. Aortic valve sclerosis is present, with no evidence of aortic valve stenosis.  5. The inferior vena cava is normal in size with greater than 50% respiratory variability, suggesting right  atrial pressure of 3 mmHg. FINDINGS  Left Ventricle: Left ventricular ejection fraction, by estimation, is 60 to 65%. Left ventricular ejection fraction by PLAX is 63 %. The left ventricle has normal function. The left ventricle has no regional wall motion abnormalities. Strain was performed and the global longitudinal strain is indeterminate. The left ventricular internal cavity size was normal in size. There is no left ventricular hypertrophy. Left ventricular diastolic parameters were normal. Right Ventricle: The right ventricular size is normal. No increase in right ventricular wall thickness. Right ventricular systolic function is normal. Left Atrium: Left atrial size was normal in size. Right Atrium: Right atrial size was normal in size. Pericardium: There is no evidence of pericardial effusion. Mitral Valve: The mitral  valve is normal in structure. No evidence of mitral valve regurgitation. No evidence of mitral valve stenosis. Tricuspid Valve: The tricuspid valve is normal in structure. Tricuspid valve regurgitation is not demonstrated. No evidence of tricuspid stenosis. Aortic Valve: The aortic valve is normal in structure. Aortic valve regurgitation is not visualized. Aortic valve sclerosis is present, with no evidence of aortic valve stenosis. Pulmonic Valve: The pulmonic valve was normal in structure. Pulmonic valve regurgitation is not visualized. No evidence of pulmonic stenosis. Aorta: The aortic root is normal in size and structure. Venous: The inferior vena cava is normal in size with greater than 50% respiratory variability, suggesting right atrial pressure of 3 mmHg. IAS/Shunts: No atrial level shunt detected by color flow Doppler. Additional Comments: 3D was performed not requiring image post processing on an independent workstation and was indeterminate.  LEFT VENTRICLE PLAX 2D LV EF:         Left            Diastology                ventricular     LV e' lateral: 11.20 cm/s                 ejection                fraction by                PLAX is 63                %. LVIDd:         3.90 cm LVIDs:         2.60 cm LV PW:         0.80 cm LV IVS:        1.00 cm LVOT diam:     2.10 cm LV SV:         69 LV SV Index:   37 LVOT Area:     3.46 cm  RIGHT VENTRICLE             IVC RV Basal diam:  2.70 cm     IVC diam: 2.30 cm RV Mid diam:    2.00 cm RV S prime:     11.20 cm/s TAPSE (M-mode): 1.9 cm LEFT ATRIUM             Index        RIGHT ATRIUM          Index LA diam:        2.80 cm 1.52 cm/m   RA Area:     8.59 cm LA Vol (A2C):   29.3 ml 15.93 ml/m  RA Volume:   14.80 ml 8.05 ml/m LA Vol (A4C):   25.7 ml 13.97 ml/m LA Biplane Vol: 28.1 ml 15.28 ml/m  AORTIC VALVE LVOT Vmax:   122.00 cm/s LVOT Vmean:  87.400 cm/s LVOT VTI:    0.198 m  AORTA Ao Root diam: 2.80 cm  SHUNTS Systemic VTI:  0.20 m Systemic Diam: 2.10 cm Evalene Lunger MD Electronically signed by Evalene Lunger MD Signature Date/Time: 03/05/2024/4:39:53 PM    Final    DG Chest Port 1 View Result Date: 03/05/2024 CLINICAL DATA:  Acute respiratory failure. EXAM: PORTABLE CHEST 1 VIEW COMPARISON:  Chest radiograph dated 03/04/2024. FINDINGS: Endotracheal tube above the carina in similar position. Enteric tube extends below the diaphragm with tip beyond the inferior margin of the image. Mild diffuse hazy density in the right lung  and interstitial prominence may be related to superimposition of the right breast. Asymmetric edema or pneumonia are less likely. No consolidative changes. No pleural effusion pneumothorax. Stable cardiac silhouette. No acute osseous pathology. IMPRESSION: 1. Endotracheal tube above the carina in similar position. 2. No focal consolidation. Electronically Signed   By: Vanetta Chou M.D.   On: 03/05/2024 11:07   DG Abd 1 View Result Date: 03/04/2024 CLINICAL DATA:  NG placement. EXAM: ABDOMEN - 1 VIEW COMPARISON:  Radiograph dated 02/29/2024. FINDINGS: Enteric tube with tip in the left hemiabdomen likely in the  body of the stomach. No bowel dilatation. Air is noted in the colon. No acute osseous pathology. IMPRESSION: Enteric tube with tip in the body of the stomach. Electronically Signed   By: Vanetta Chou M.D.   On: 03/04/2024 13:12   DG Chest Port 1 View Result Date: 03/04/2024 CLINICAL DATA:  Status post intubation and OG tube placement. EXAM: PORTABLE CHEST 1 VIEW COMPARISON:  March 04, 2024 (9:58 a.m.) FINDINGS: Since the prior study there is been interval placement of an endotracheal tube with its distal tip approximately 7.4 cm from the carina. Interval enteric tube placement is also seen with its distal tip just beyond the expected region of the gastroesophageal junction. The distal side hole is approximately 4.7 cm proximal to the gastroesophageal junction. The heart size and mediastinal contours are within normal limits. Mild left perihilar airspace disease is suspected. No pleural effusion or pneumothorax is identified. No acute osseous abnormalities are identified. IMPRESSION: 1. Interval endotracheal tube and enteric tube placement, as described above. Advancement of the enteric tube by approximately 10 cm is recommended to decrease the risk of aspiration. 2. Mild left perihilar airspace disease. Electronically Signed   By: Suzen Dials M.D.   On: 03/04/2024 11:32   DG Chest Port 1 View Result Date: 03/04/2024 EXAM: 1 VIEW(S) XRAY OF THE CHEST 03/04/2024 10:00:00 AM COMPARISON: 03/03/2024 CLINICAL HISTORY: [id number] Atelectasis 91209 91209 Atelectasis 91209 91209 Atelectasis 91209 Atelectasis 91209 Atelectasis 91209 FINDINGS: LINES, TUBES AND DEVICES: Endotracheal and enteric tubes removed. LUNGS AND PLEURA: Minor bibasilar atelectasis. No pleural effusion. No pneumothorax. HEART AND MEDIASTINUM: No acute abnormality of the cardiac and mediastinal silhouettes. BONES AND SOFT TISSUES: No acute osseous abnormality. IMPRESSION: 1. Minimal bibasilar opacities likely reflecting atelectasis, decreased  from prior. 2. Interval removal of endotracheal and enteric tubes. Electronically signed by: Donnice Mania MD 03/04/2024 10:23 AM EST RP Workstation: [id number]     Labs   CBC: Recent Labs  Lab 02/29/24 1833 03/01/24 0201 03/02/24 0326 03/03/24 0346 03/04/24 0234 03/05/24 0420 03/06/24 0225  WBC 10.7*   < > 11.6* 11.0* 11.3* 10.3 10.5  NEUTROABS 5.2  --   --   --   --   --   --   HGB 12.7   < > 11.6* 10.5* 11.2* 10.5* 10.1*  HCT 40.9   < > 37.7 33.6* 34.6* 33.9* 32.1*  MCV 94.9   < > 95.4 93.3 92.0 93.9 95.3  PLT 313   < > 292 260 272 283 264   < > = values in this interval not displayed.    Basic Metabolic Panel: Recent Labs  Lab 03/02/24 0326 03/02/24 1252 03/03/24 0346 03/04/24 0234 03/05/24 0420 03/06/24 0225  NA 138 138 140 143 141 135  K 5.7* 5.4* 5.1 4.9 4.7 4.6  CL 102 104 103 104 106 102  CO2 23 26 28 29 28 25   GLUCOSE 180* 127* 154* 167* 178* 120*  BUN 33* 29* 27* 31* 26* 25*  CREATININE 2.26* 1.55* 1.14* 1.00 0.86 0.72  CALCIUM  9.0 9.4 9.0 9.5 9.3 8.3*  MG 3.2*  --  2.8* 2.6* 2.3 2.2  PHOS 5.9*  --  4.0 3.1 3.0 3.4   GFR: Estimated Creatinine Clearance: 79.1 mL/min (by C-G formula based on SCr of 0.72 mg/dL). Recent Labs  Lab 02/29/24 1834 02/29/24 2018 03/01/24 0201 03/03/24 0346 03/04/24 0234 03/05/24 0420 03/06/24 0225  PROCALCITON  --  <0.10  --   --   --  <0.10 <0.10  WBC  --   --    < > 11.0* 11.3* 10.3 10.5  LATICACIDVEN 1.7 1.7  --   --   --   --   --    < > = values in this interval not displayed.    Liver Function Tests: Recent Labs  Lab 02/29/24 1833  AST 23  ALT 20  ALKPHOS 90  BILITOT <0.2  PROT 7.1  ALBUMIN 4.6   No results for input(s): LIPASE, AMYLASE in the last 168 hours. No results for input(s): AMMONIA in the last 168 hours.  ABG    Component Value Date/Time   PHART 7.38 03/04/2024 1256   PCO2ART 60 (H) 03/04/2024 1256   PO2ART 89 03/04/2024 1256   HCO3 30.3 (H) 03/06/2024 0238   ACIDBASEDEF 0.8  03/01/2024 0432   O2SAT 80.6 03/06/2024 0238     Coagulation Profile: No results for input(s): INR, PROTIME in the last 168 hours.  Cardiac Enzymes: No results for input(s): CKTOTAL, CKMB, CKMBINDEX, TROPONINI in the last 168 hours.  HbA1C: Hemoglobin A1C  Date/Time Value Ref Range Status  09/21/2021 02:00 PM 6.4 (A) 4.0 - 5.6 % Final  03/22/2021 09:52 AM 6.2 (A) 4.0 - 5.6 % Final  01/15/2014 12:00 AM 6.8  Final   HbA1c, POC (prediabetic range)  Date/Time Value Ref Range Status  05/11/2020 10:30 AM 0 (A) 5.7 - 6.4 % Final   HbA1c, POC (controlled diabetic range)  Date/Time Value Ref Range Status  05/11/2020 10:30 AM 0.0 0.0 - 7.0 % Final   HbA1c POC (<> result, manual entry)  Date/Time Value Ref Range Status  05/11/2020 10:30 AM 6.5 4.0 - 5.6 % Final   Hgb A1c MFr Bld  Date/Time Value Ref Range Status  03/01/2024 02:02 AM 6.4 (H) 4.8 - 5.6 % Final    Comment:    (NOTE) Diagnosis of Diabetes The following HbA1c ranges recommended by the American Diabetes Association (ADA) may be used as an aid in the diagnosis of diabetes mellitus.  Hemoglobin             Suggested A1C NGSP%              Diagnosis  <5.7                   Non Diabetic  5.7-6.4                Pre-Diabetic  >6.4                   Diabetic  <7.0                   Glycemic control for                       adults with diabetes.    12/24/2019 11:08 AM 6.7 (H) 4.8 - 5.6 % Final    Comment:  Prediabetes: 5.7 - 6.4          Diabetes: >6.4          Glycemic control for adults with diabetes: <7.0    CBG: Recent Labs  Lab 03/05/24 0716 03/05/24 1210 03/05/24 1622 03/06/24 0447 03/06/24 0735  GLUCAP 139* 161* 132* 126* 102*    Review of Systems:   Unable to be obtained secondary to the patient's intubated and sedated status.   Past Medical History  She,  has a past medical history of Allergy, Anxiety, Asthma, Bilateral carpal tunnel syndrome (12/31/2018), Bipolar  affective, manic (HCC) (1996), COPD (chronic obstructive pulmonary disease) (HCC), Depression, HLD (hyperlipidemia) (03/12/2018), Lung nodule, multiple (09/26/2016), Manic depressive illness (HCC) (1996), Seizures (HCC), Substance abuse (HCC), and Tobacco abuse (10/04/2016).   Surgical History    Past Surgical History:  Procedure Laterality Date   ABDOMINAL HYSTERECTOMY     CARPAL TUNNEL RELEASE Right 01/15/2019   Procedure: RIGHTCARPAL TUNNEL RELEASE, LEFT DEQUERVAINS INJECTION;  Surgeon: Jerri Kay HERO, MD;  Location: [facility];  Service: Orthopedics;  Laterality: Right;   CARPAL TUNNEL RELEASE Left 02/26/2019   Procedure: LEFT CARPAL TUNNEL RELEASE;  Surgeon: Jerri Kay HERO, MD;  Location: [facility];  Service: Orthopedics;  Laterality: Left;  Bier block   COLONOSCOPY WITH PROPOFOL  N/A 11/28/2018   Procedure: COLONOSCOPY WITH PROPOFOL ;  Surgeon: Therisa Bi, MD;  [facility];  Service: Gastroenterology;  Laterality: N/A;   TUBAL LIGATION       Social History   reports that she has been smoking cigarettes. Her smokeless tobacco use includes chew and snuff. She reports that she does not currently use alcohol. She reports that she does not use drugs.   Family History   Her family history includes Bipolar disorder in her brother and brother; CAD in her father; Heart attack in her father; Other in her mother; Schizophrenia in her brother and brother; Stroke in her mother.   Allergies Allergies[1]   Home Medications  Prior to Admission medications  Medication Sig Start Date End Date Taking? Authorizing Provider  albuterol  (PROVENTIL  HFA) 108 (90 Base) MCG/ACT inhaler Inhale 2 puffs into the lungs every 4-6 hours as needed. 09/21/21   Iloabachie, Chioma E, NP  amphetamine-dextroamphetamine (ADDERALL XR) 10 MG 24 hr capsule Take 10 mg by mouth daily.    [provider]  citalopram  (CELEXA ) 40 MG tablet Take 1 tablet (40 mg total) by mouth daily.  11/21/22     Fluticasone  Furoate (ARNUITY ELLIPTA ) 200 MCG/ACT AEPB Inhale 1 puff into the lungs daily. 12/06/23   Isadora Hose, MD  hydrOXYzine  (ATARAX ) 50 MG tablet Take 1-1.5 tablets (50-75 mg total) by mouth at bedtime as needed for sleep 11/21/22     ipratropium-albuterol  (DUONEB) 0.5-2.5 (3) MG/3ML SOLN INHALE CONTENTS OF ONE VIAL ( TOTAL) USING NEBULIZER  ONCE EVERY 6 HOURS. 11/02/20 12/06/23  Iloabachie, Chioma E, NP  lamoTRIgine  (LAMICTAL ) 100 MG tablet Take 1 tablet (100 mg total) by mouth 2 (two) times daily. 06/23/20   Gayland Lauraine PARAS, NP  lamoTRIgine  (LAMICTAL ) 100 MG tablet Take 1 tablet (100 mg total) by mouth daily. 11/21/22     meloxicam  (MOBIC ) 15 MG tablet Take 15 mg by mouth daily.    [provider]  metFORMIN  (GLUCOPHAGE ) 500 MG tablet TAKE ONE TABLET BY MOUTH TWICE DAILY WITH MEALS. 09/21/21   Iloabachie, Chioma E, NP  methocarbamol  (ROBAXIN ) 500 MG tablet Take 500 mg by mouth at bedtime.    [provider]  nicotine  polacrilex (NICOTINE  MINI) 2 MG lozenge Take 1 lozenge (2 mg total) by mouth every 2 (two) hours as needed for smoking cessation. 12/06/23 03/05/24  Isadora Hose, MD  rosuvastatin  (CRESTOR ) 10 MG tablet Take 10 mg by mouth at bedtime. 08/13/23   [provider]  traMADol (ULTRAM) 50 MG tablet Take 1 tablet by mouth every 6 (six) hours as needed.    [provider]  triamcinolone  cream (KENALOG ) 0.1 % Apply 1 Application topically 2 (two) times daily. 09/21/21   Iloabachie, Chioma E, NP  umeclidinium-vilanterol (ANORO ELLIPTA ) 62.5-25 MCG/ACT AEPB Inhale 1 puff into the lungs daily. 12/06/23   Isadora Hose, MD  atomoxetine  (STRATTERA ) 25 MG capsule Take 1 capsule (25 mg total) by mouth once every evening after dinner. Patient not taking: Reported on 12/23/2020 10/26/20 03/09/21    loratadine  (CLARITIN ) 10 MG tablet Take 1 tablet (10 mg total) by mouth daily as needed for allergies. 12/16/19 12/25/19  Iloabachie, Chioma E, NP    Scheduled Meds:  Chlorhexidine  Gluconate Cloth  6 each Topical Daily   chlorpheniramine-HYDROcodone   5 mL Per Tube Q12H   citalopram   40 mg Per Tube Daily   diphenhydrAMINE   25 mg Intravenous Q6H   enoxaparin  (LOVENOX ) injection  40 mg Subcutaneous Q24H   free water   30 mL Per Tube Q4H   insulin  aspart  0-15 Units Subcutaneous Q4H   ipratropium-albuterol   3 mL Nebulization Q4H   lamoTRIgine   100 mg Per Tube Daily   methylPREDNISolone  (SOLU-MEDROL ) injection  30 mg Intravenous Q12H   nicotine   7 mg Transdermal Daily   mouth rinse  15 mL Mouth Rinse Q2H   thiamine   100 mg Per Tube Daily   Continuous Infusions:  sodium chloride      ceFEPime  (MAXIPIME ) IV 2 g (03/06/24 0809)   dexmedetomidine  (PRECEDEX ) IV infusion Stopped (03/05/24 1240)   famotidine  (PEPCID ) IV Stopped (03/05/24 2030)   feeding supplement (PIVOT 1.5 CAL) Stopped (03/05/24 1930)   fentaNYL  infusion INTRAVENOUS 200 mcg/hr (03/06/24 0724)   propofol  (DIPRIVAN ) infusion 60 mcg/kg/min (03/06/24 0615)   PRN Meds:.sodium chloride , acetaminophen , albuterol , fentaNYL , lip balm, midazolam  PF, mouth rinse, polyethylene glycol, senna  Critical care provider statement:   Total critical care time: 33 minutes   Performed by: Parris MD   Critical care time was exclusive of separately billable procedures and treating other patients.   Critical care was necessary to treat or prevent imminent or life-threatening deterioration.   Critical care was time spent personally by me on the following activities: development of treatment plan with patient and/or surrogate as well as nursing, discussions with consultants, evaluation of patient's response to treatment, examination of patient, obtaining history from patient or surrogate, ordering and performing treatments and interventions, ordering and review of laboratory studies, ordering and review of radiographic studies, pulse oximetry and re-evaluation of patient's condition.     Teddi Badalamenti, M.D.  Pulmonary & Critical Care Medicine               [1]  Allergies Allergen Reactions   Flax Seed Oil [Flax Seed Oil] Hives    Pt. Self reported   Other Hives    Pt. Self reported   Trazodone Other (See Comments)    Bed wetting  Other Reaction(s): Other (See Comments)    Bed wetting   "

## 2024-03-06 NOTE — Plan of Care (Signed)
  Problem: Elimination: Goal: Will not experience complications related to bowel motility Outcome: Progressing   Problem: Elimination: Goal: Will not experience complications related to urinary retention Outcome: Progressing   Problem: Pain Managment: Goal: General experience of comfort will improve and/or be controlled Outcome: Progressing   Problem: Safety: Goal: Ability to remain free from injury will improve Outcome: Progressing

## 2024-03-06 NOTE — Progress Notes (Signed)
 RN notified Dr. DELENA pt had failed her SBP and sedation was turn back on. RN called RT to bedside because pt started to desat on her previous vent settings. RN notice pt's neck look swollen as she was holding her breath. PRN fentanyl  and versed  were given but not progress was made. Per Dr. DELENA RN to order a stat chest xray and order a one time dose of Vecuronium  10 mg to help pt become synchronous with the vent.

## 2024-03-06 NOTE — Plan of Care (Signed)
  Problem: Clinical Measurements: Goal: Ability to maintain clinical measurements within normal limits will improve Outcome: Progressing Goal: Diagnostic test results will improve Outcome: Progressing Goal: Respiratory complications will improve Outcome: Progressing Goal: Cardiovascular complication will be avoided Outcome: Progressing   Problem: Nutrition: Goal: Adequate nutrition will be maintained Outcome: Progressing   Problem: Safety: Goal: Ability to remain free from injury will improve Outcome: Progressing   Problem: Skin Integrity: Goal: Risk for impaired skin integrity will decrease Outcome: Progressing

## 2024-03-06 NOTE — TOC CM/SW Note (Signed)
 Transition of Care [facility]) - Inpatient Brief Assessment   Patient Details  Name: Erica Gomez MRN: [id number] Date of Birth: 1965/09/17  Transition of Care [facility]) CM/SW Contact:    Lauraine JAYSON Carpen, LCSW Phone Number: 03/06/2024, 11:44 AM   Clinical Narrative: CSW reviewed chart. No insurance. Sent secure chat to pharmacist to notify. No other TOC needs identified at this time. CSW will continue to follow progress. Please place [facility] consult if any needs arise.  Transition of Care Asessment: Insurance and Status: Selfpay Patient has primary care physician: Yes Home environment has been reviewed: Single family home Prior level of function:: Not documented Prior/Current Home Services: No current home services Social Drivers of Health Review: SDOH reviewed no interventions necessary Readmission risk has been reviewed: Yes Transition of care needs: no transition of care needs at this time

## 2024-03-06 NOTE — Progress Notes (Signed)
 Nutrition Follow Up Note   DOCUMENTATION CODES:   Not applicable  INTERVENTION:   Resume Pivot 1.5@20ml /hr, once tolerating, begin increasing by 10ml/hr q 8 hours until goal rate of 13ml/hr  is reached.   Free water  flushes 30ml q4 hours to maintain tube patency   Regimen at goal rate provides 1800kcal/day, 113g/day protein and 103ml/day of free water .   Continue thiamine  100mg  daily via tube x 7 days   Pt remains at high refeed risk; recommend monitor potassium, magnesium  and phosphorus labs daily until stable  Daily weights   NUTRITION DIAGNOSIS:   Inadequate oral intake related to inability to eat (pt sedated and ventilated) as evidenced by NPO status. -ongoing   GOAL:   Provide needs based on ASPEN/SCCM guidelines -not met   MONITOR:   TF tolerance, Vent status, Labs, Weight trends, Skin, I & O's  ASSESSMENT:   59 y/o female with h/o DM, COPD, seizures, lung nodules, CAD, anxiety, MDD, bipolar disorder, HLD and substance abuse who is admitted with AMS, COPD, exacerbation, PNA and sepsis.  Pt remains sedated and ventilated. OGT exchanged this morning after patient was noted to have an episode of possible emesis last night. OGT immediately put to suction with output. Tube feeds are being held. No more vomiting noted today; will resume tube feeds at trickle rate. No BM since 1/20; pt is receiving bowel regimen. Per chart, pt is down ~2lbs from his UBW.   Medications reviewed and include: celexa , lovenox , insulin , solu-medrol , nicotine ,  thiamine , cefepime , pepcid , propofol    Labs reviewed: K 4.6 wnl, BUN 25(H), P 3.4 wnl, Mg 2.2 wnl Hgb 10.1(L), Hct 32.1(L) Cbgs- 117, 102, 126 x 24 hrs   Patient is currently intubated on ventilator support MV: 12.7 L/min Temp (24hrs), Avg:99 F (37.2 C), Min:97.7 F (36.5 C), Max:100 F (37.8 C)  Propofol : 18.14 ml/hr- provides 478kcal/day  MAP >16mmHg   UOP-   Diet Order:    Diet Order     None       EDUCATION NEEDS:   No education needs have been identified at this time  Skin:  Skin Assessment: Reviewed RN Assessment  Last BM:  1/20- type 7  Height:   Ht Readings from Last 1 Encounters:  02/29/24 5' 6 (1.676 m)    Weight:   Wt Readings from Last 1 Encounters:  03/06/24 74.5 kg    Ideal Body Weight:  59 kg  BMI:  Body mass index is 26.51 kg/m.  Estimated Nutritional Needs:   Kcal:  1844kcal/day  Protein:  105-120g/day  Fluid:  1.6-1.8L/day  Augustin Shams MS, RD, LDN If unable to be reached, please send secure chat to RD inpatient available from 8:00a-4:00p daily

## 2024-03-07 ENCOUNTER — Inpatient Hospital Stay: Payer: MEDICAID

## 2024-03-07 DIAGNOSIS — J9601 Acute respiratory failure with hypoxia: Secondary | ICD-10-CM

## 2024-03-07 DIAGNOSIS — A419 Sepsis, unspecified organism: Secondary | ICD-10-CM

## 2024-03-07 DIAGNOSIS — J45901 Unspecified asthma with (acute) exacerbation: Secondary | ICD-10-CM

## 2024-03-07 DIAGNOSIS — R739 Hyperglycemia, unspecified: Secondary | ICD-10-CM

## 2024-03-07 DIAGNOSIS — G9341 Metabolic encephalopathy: Secondary | ICD-10-CM

## 2024-03-07 DIAGNOSIS — J9602 Acute respiratory failure with hypercapnia: Secondary | ICD-10-CM

## 2024-03-07 DIAGNOSIS — J449 Chronic obstructive pulmonary disease, unspecified: Secondary | ICD-10-CM

## 2024-03-07 DIAGNOSIS — J13 Pneumonia due to Streptococcus pneumoniae: Secondary | ICD-10-CM

## 2024-03-07 LAB — GLUCOSE, CAPILLARY
Glucose-Capillary: 122 mg/dL — ABNORMAL HIGH (ref 70–99)
Glucose-Capillary: 105 mg/dL — ABNORMAL HIGH (ref 70–99)
Glucose-Capillary: 124 mg/dL — ABNORMAL HIGH (ref 70–99)
Glucose-Capillary: 139 mg/dL — ABNORMAL HIGH (ref 70–99)
Glucose-Capillary: 108 mg/dL — ABNORMAL HIGH (ref 70–99)

## 2024-03-07 LAB — BASIC METABOLIC PANEL WITH GFR
Anion gap: 8 (ref 5–15)
BUN: 28 mg/dL — ABNORMAL HIGH (ref 6–20)
CO2: 29 mmol/L (ref 22–32)
Calcium: 9.9 mg/dL (ref 8.9–10.3)
Chloride: 104 mmol/L (ref 98–111)
Creatinine, Ser: 0.87 mg/dL (ref 0.44–1.00)
GFR, Estimated: >60 mL/min
Glucose, Bld: 131 mg/dL — ABNORMAL HIGH (ref 70–99)
Potassium: 5 mmol/L (ref 3.5–5.1)
Sodium: 141 mmol/L (ref 135–145)

## 2024-03-07 LAB — BLOOD GAS, ARTERIAL
Acid-base deficit: 0.3 mmol/L (ref 0.0–2.0)
Bicarbonate: 27.7 mmol/L (ref 20.0–28.0)
FIO2: 40 %
Mechanical Rate: 24
O2 Saturation: 97.3 %
PEEP: 5 cmH2O
Patient temperature: 37
Pressure control: 20 cmH2O
pCO2 arterial: 59 mmHg — ABNORMAL HIGH (ref 32–48)
pH, Arterial: 7.28 — ABNORMAL LOW (ref 7.35–7.45)
pO2, Arterial: 84 mmHg (ref 83–108)

## 2024-03-07 LAB — CBC
HCT: 36.6 % (ref 36.0–46.0)
Hemoglobin: 10.9 g/dL — ABNORMAL LOW (ref 12.0–15.0)
MCH: 29.2 pg (ref 26.0–34.0)
MCHC: 29.8 g/dL — ABNORMAL LOW (ref 30.0–36.0)
MCV: 98.1 fL (ref 80.0–100.0)
Platelets: 272 K/uL (ref 150–400)
RBC: 3.73 MIL/uL — ABNORMAL LOW (ref 3.87–5.11)
RDW: 14.3 % (ref 11.5–15.5)
WBC: 12.1 K/uL — ABNORMAL HIGH (ref 4.0–10.5)
nRBC: 0 % (ref 0.0–0.2)

## 2024-03-07 LAB — HEPATIC FUNCTION PANEL
ALT: 28 U/L (ref 0–44)
AST: 26 U/L (ref 15–41)
Albumin: 3.5 g/dL (ref 3.5–5.0)
Alkaline Phosphatase: 57 U/L (ref 38–126)
Bilirubin, Direct: <0.1 mg/dL (ref 0.0–0.2)
Total Bilirubin: <0.2 mg/dL (ref 0.0–1.2)
Total Protein: 6.2 g/dL — ABNORMAL LOW (ref 6.5–8.1)

## 2024-03-07 LAB — PHOSPHORUS: Phosphorus: 3.5 mg/dL (ref 2.5–4.6)

## 2024-03-07 LAB — CULTURE, RESPIRATORY W GRAM STAIN: Culture: NORMAL

## 2024-03-07 LAB — MAGNESIUM: Magnesium: 2.2 mg/dL (ref 1.7–2.4)

## 2024-03-07 MED ORDER — VECURONIUM BROMIDE 10 MG IV SOLR
10.0000 mg | INTRAVENOUS | Status: DC | PRN
  Administered 2024-03-07 – 2024-03-08 (×5): 10 mg via INTRAVENOUS
  Filled 2024-03-07 (×5): qty 10

## 2024-03-07 MED ORDER — METHYLPREDNISOLONE SODIUM SUCC 125 MG IJ SOLR
80.0000 mg | Freq: Two times a day (BID) | INTRAMUSCULAR | Status: DC
  Administered 2024-03-07 – 2024-03-12 (×11): 80 mg via INTRAVENOUS
  Filled 2024-03-07 (×11): qty 2

## 2024-03-07 MED ORDER — KETAMINE HCL-SODIUM CHLORIDE 1000-0.69 MG/100ML-% IV SOLN: 1.0000 mg/kg/h | INTRAVENOUS | Status: DC

## 2024-03-07 MED ORDER — KETAMINE HCL-SODIUM CHLORIDE 1000-0.69 MG/100ML-% IV SOLN
1.0000 mg/kg/h | INTRAVENOUS | Status: DC
  Administered 2024-03-07 (×2): 1 mg/kg/h via INTRAVENOUS
  Administered 2024-03-08: 2 mg/kg/h via INTRAVENOUS
  Administered 2024-03-08 – 2024-03-09 (×5): 3 mg/kg/h via INTRAVENOUS
  Filled 2024-03-07 (×8): qty 100

## 2024-03-07 MED ORDER — IPRATROPIUM-ALBUTEROL 0.5-2.5 (3) MG/3ML IN SOLN
3.0000 mL | RESPIRATORY_TRACT | Status: DC
  Administered 2024-03-07 – 2024-03-08 (×7): 3 mL via RESPIRATORY_TRACT
  Filled 2024-03-07 (×7): qty 3

## 2024-03-07 MED ORDER — SODIUM ZIRCONIUM CYCLOSILICATE 5 G PO PACK
10.0000 g | PACK | Freq: Once | ORAL | Status: AC
  Administered 2024-03-07: 10 g
  Filled 2024-03-07: qty 2

## 2024-03-07 MED ORDER — KETAMINE BOLUS VIA INFUSION
0.5000 mg/kg | Freq: Once | INTRAVENOUS | Status: AC
  Administered 2024-03-07: 0.5 mg via INTRAVENOUS
  Filled 2024-03-07: qty 40

## 2024-03-07 MED ORDER — MAGNESIUM SULFATE 50 % IJ SOLN: 3.0000 g | Freq: Once | INTRAVENOUS | Status: DC

## 2024-03-07 MED ORDER — MAGNESIUM SULFATE IN D5W 1-5 GM/100ML-% IV SOLN
1.0000 g | Freq: Once | INTRAVENOUS | Status: AC
  Administered 2024-03-07: 1 g via INTRAVENOUS
  Filled 2024-03-07: qty 100

## 2024-03-07 MED ORDER — MAGNESIUM SULFATE 2 GM/50ML IV SOLN
2.0000 g | Freq: Once | INTRAVENOUS | Status: AC
  Administered 2024-03-07: 2 g via INTRAVENOUS
  Filled 2024-03-07: qty 50

## 2024-03-07 MED ORDER — MAGNESIUM SULFATE IN D5W 1-5 GM/100ML-% IV SOLN
1.0000 g | Freq: Once | INTRAVENOUS | Status: DC
  Filled 2024-03-07: qty 100

## 2024-03-07 MED ORDER — POLYETHYLENE GLYCOL 3350 17 G PO PACK
17.0000 g | PACK | Freq: Every day | ORAL | Status: DC
  Administered 2024-03-07 – 2024-03-17 (×6): 17 g
  Filled 2024-03-07 (×6): qty 1

## 2024-03-07 MED ORDER — HYDROMORPHONE HCL-NACL 50-0.9 MG/50ML-% IV SOLN
0.5000 mg/h | INTRAVENOUS | Status: DC
  Administered 2024-03-07 (×2): 1 mg/h via INTRAVENOUS
  Filled 2024-03-07 (×2): qty 50

## 2024-03-07 MED ORDER — DIPHENHYDRAMINE HCL 50 MG/ML IJ SOLN
50.0000 mg | Freq: Once | INTRAMUSCULAR | Status: AC
  Administered 2024-03-07: 50 mg via INTRAVENOUS
  Filled 2024-03-07: qty 1

## 2024-03-07 MED ORDER — SENNA 8.6 MG PO TABS
1.0000 | ORAL_TABLET | Freq: Two times a day (BID) | ORAL | Status: AC
  Administered 2024-03-07 – 2024-03-21 (×19): 8.6 mg
  Filled 2024-03-07 (×22): qty 1

## 2024-03-07 MED ORDER — HYDROMORPHONE HCL 1 MG/ML IJ SOLN
1.0000 mg | Freq: Once | INTRAMUSCULAR | Status: AC
  Administered 2024-03-07: 1 mg via INTRAVENOUS
  Filled 2024-03-07: qty 1

## 2024-03-07 MED ORDER — BISACODYL 10 MG RE SUPP
10.0000 mg | Freq: Once | RECTAL | Status: AC
  Administered 2024-03-07: 10 mg via RECTAL
  Filled 2024-03-07: qty 1

## 2024-03-07 MED ORDER — HYDROMORPHONE BOLUS VIA INFUSION
0.2500 mg | INTRAVENOUS | Status: DC | PRN
  Administered 2024-03-07 (×3): 1 mg via INTRAVENOUS
  Administered 2024-03-08 (×2): 2 mg via INTRAVENOUS

## 2024-03-07 MED ORDER — SODIUM CHLORIDE 0.45 % IV SOLN: INTRAVENOUS | Status: AC

## 2024-03-07 MED ORDER — PIVOT 1.5 CAL PO LIQD
1000.0000 mL | ORAL | Status: DC
  Filled 2024-03-07: qty 1000

## 2024-03-07 NOTE — Progress Notes (Addendum)
 PHARMACY CONSULT NOTE - ELECTROLYTES  Pharmacy Consult for Electrolyte Monitoring and Replacement   Recent Labs: Height: 5' 6 (167.6 cm) Weight:  (unable to get weight. Bed was zeroed) IBW/kg (Calculated) : 59.3 Estimated Creatinine Clearance: 72.8 mL/min (by C-G formula based on SCr of 0.87 mg/dL).  Potassium (mmol/L)  Date Value  03/07/2024 5.0  11/12/2013 4.1   Magnesium  (mg/dL)  Date Value  98/76/7973 2.2   Calcium  (mg/dL)  Date Value  98/76/7973 9.9   Calcium , Total (mg/dL)  Date Value  90/69/7984 9.0   Albumin (g/dL)  Date Value  98/83/7973 4.6  05/12/2020 4.5  11/12/2013 3.5   Phosphorus (mg/dL)  Date Value  98/76/7973 3.5   Sodium (mmol/L)  Date Value  03/07/2024 141  12/06/2022 139  11/12/2013 141    Assessment  Erica Gomez is a 59 y.o. female presenting with respiratory failure. PMH significant for COPD, asthma, CAD, seizures, and bipolar disorder. Pt has been hyperkalemic up to 6.1 on 1/17. Pharmacy has been consulted to monitor and replace electrolytes.  Diet: Tube feeds @ 17mL/hr  + free water  30mL Q4H  MIVF: 0.45 % NaCl at 50 mL/hr  Goal of Therapy: Electrolytes WNL  Plan:  sodium zirconium cyclosilicate  10 grams po x 1 Follow K, Mg, and BMP with AM labs  Thank you for allowing pharmacy to be a part of this patient's care.  Adriana Bolster, PharmD, BCPS 03/07/2024 7:43 AM

## 2024-03-07 NOTE — Procedures (Signed)
 Arterial Catheter Insertion Procedure Note  Erica Gomez  [id number]  1965/11/20  Date:03/07/24  Time:3:47 PM    Provider Performing: Inge JONETTA Lecher    Procedure: Insertion of Arterial Line (63379) with US  guidance (23062)   Indication(s) Blood pressure monitoring and/or need for frequent ABGs  Consent Risks of the procedure as well as the alternatives and risks of each were explained to the patient and/or caregiver.  Consent for the procedure was obtained and is signed in the bedside chart  Anesthesia None   Time Out Verified patient identification, verified procedure, site/side was marked, verified correct patient position, special equipment/implants available, medications/allergies/relevant history reviewed, required imaging and test results available.   Sterile Technique Maximal sterile technique including full sterile barrier drape, hand hygiene, sterile gown, sterile gloves, mask, hair covering, sterile ultrasound probe cover (if used).   Procedure Description Area of catheter insertion was cleaned with chlorhexidine  and draped in sterile fashion. With real-time ultrasound guidance an arterial catheter was placed into the left radial artery.  Appropriate arterial tracings confirmed on monitor.     Complications/Tolerance None; patient tolerated the procedure well.   EBL Minimal   Specimen(s) None   BIOPATCH applied to the insertion site   Inge Lecher, AGACNP-BC  Pulmonary & Critical Care Prefer epic messenger for cross cover needs If after hours, please call E-link

## 2024-03-07 NOTE — Progress Notes (Signed)
 "  NAME:  Erica Gomez, MRN:  [id number], DOB:  1965/07/01, LOS: 7 ADMISSION DATE:  02/29/2024, CONSULTATION DATE:  02/29/24 REFERRING MD:  Dr. Willo, CHIEF COMPLAINT:  Acute Respiratory Distress    Brief Pt Description / Synopsis:  59 y.o. female admitted with Acute Hypoxic and Hypercapnic Respiratory Failure due to Strep Pneumoniae Pneumonia, severe COPD/Asthma Exacerbation requiring intubation and mechanical ventilation.   History of Present Illness:  59 y/o F with PMH sig for asthma, COPD, tobacco use disorder, bipolar disorder, seizure, polysubstance abuse, anxiety/depression, and HLD presented to ED BIB [facility] from home for acute respiratory distress.   On Chart review, patient follows with pulmonologist; last visit with Dr. Isadora on 10/25. Meds prescribed at visit included fluticasone  furoate (Arnuity Ellipta ) 200 mcg 1 puff daily and umeclidinium-vilanterol (Anoro Ellipta ) 62.5/25 mcg 1 puff daily. Per husband, patient has been taking medications as prescribed. EMS tx included MgSO? 2 g IV, Solu-Medrol  125 mg IV, DuoNeb x1, and albuterol  x1. During transport, pt became combative/confused and received Haldol 4 mg IM and Versed  5 mg IM. Initially A&O with EMS. SpO? 94% on RA.   ED Course: Initial VS: BP 211/120, HR 142, SpO? 94% on RA. Patient was unresponsive with labored/heaving respirations and in severe resp distress. Concern for hypercapnic resp failure contributing to agitation and AMS. Pt subsequently lost ability to protect airway. Given worsening MS and resp failure, decision made for emergent intubation. Pt received ketamine  and rocuronium  and was intubated successfully without complication.   Labs/Imaging: Labs: WBC 10.7, Hgb 12.7, Hct 40.9, Plt 313. BMP: Na 141, K 4.9, Cl 106, CO? 25, BUN 14, Cr 0.81, Glu 147, Ca 8.9. LFTs WNL: AST 23, ALT 20, Alk Phos 90, Tbili <0.2, Alb 4.6. VBG c/w resp acidosis/hypercapnia (HCO? 29.8), O? sat 98.3. Lactate 1.7. CXR: no acute cardiopulm process;  ETT in mid trachea, enteric tube in stomach. CT head ordered and pending for AMS. PCCM asked to admit for further workup and treatment.  Please see Significant Hospital Events section below for full detailed hospital course.   Pertinent  Medical History   Past Medical History:  Diagnosis Date   Allergy    Anxiety    Asthma    Bilateral carpal tunnel syndrome 12/31/2018   Bipolar affective, manic (HCC) 1996   COPD (chronic obstructive pulmonary disease) (HCC)    Depression    HLD (hyperlipidemia) 03/12/2018   Lung nodule, multiple 09/26/2016   Manic depressive illness (HCC) 1996   Seizures (HCC)    Substance abuse (HCC)    Tobacco abuse 10/04/2016    Micro Data:  02/29/24 SARS-CoV-2 PCR ? negative 02/29/24 Flu PCR ? negative 02/29/24 RVP ? negative 02/29/24 BCx2 ? no growth 02/29/24 UCx ? 02/29/24 MRSA PCR ? negative 02/29/24 Strep pneumo Ur Ag + 02/29/24 Legionella Ur Ag ?  negative 02/29/24: Respiratory viral panel>> negative 03/01/24: Tracheal aspirate>> normal flora 03/05/24: Repeat tracheal aspirate>> normal flora   Antimicrobials:   Anti-infectives (From admission, onward)    Start     Dose/Rate Route Frequency Ordered Stop   03/06/24 0000  ceFEPIme  (MAXIPIME ) 2 g in sodium chloride  0.9 % 100 mL IVPB        2 g 200 mL/hr over 30 Minutes Intravenous Every 8 hours 03/05/24 2329     03/05/24 0200  cefTRIAXone  (ROCEPHIN ) 2 g in sodium chloride  0.9 % 100 mL IVPB  Status:  Discontinued        2 g 200 mL/hr over 30 Minutes Intravenous Every  24 hours 03/05/24 0050 03/05/24 2330   03/01/24 1000  doxycycline  (VIBRAMYCIN ) 100 mg in sodium chloride  0.9 % 250 mL IVPB  Status:  Discontinued        100 mg 125 mL/hr over 120 Minutes Intravenous Every 12 hours 03/01/24 0826 03/01/24 1003   03/01/24 0200  cefTRIAXone  (ROCEPHIN ) 1 g in sodium chloride  0.9 % 100 mL IVPB  Status:  Discontinued        1 g 200 mL/hr over 30 Minutes Intravenous Every 24 hours 03/01/24 0041 03/05/24  0050       Significant Hospital Events: Including procedures, antibiotic start and stop dates in addition to other pertinent events   1/16: Admitted to ICU for asthma/COPD overlap exacerbation with hypercapnic respiratory failure requiring emergent intubation. 03/01/24- patient failed SBT today, remains on mV. She has AKI.   Strep pneumoniae +, will continue Rocephin .  Husband at bedside I met with him and we reviewed medical plan.  03/02/24- patient had resp distress overnight.  She still has AKI but there is an interval improvement.  03/03/24- patient for SBT today.  AKI has improved this am.  She requires heavy sedation. She has family coming in this afternoon who wishes to be present for SBT. 03/04/24- patient passed SBT and was extubated but after 1 hour started to have respiratory distress with severe hypoxemia. Her daughter was present and requested re-intubation. We continue to treat her underlying Asthma COPD exacerbation with strep pneumoniae respiratory infection.  03/05/24- Patient failed SBT today with tachypnea/tachycardia/resp distress 03/06/24- Patient failed SBT and husband was present during evaluation, she was tachypneic and tachycardic. 03/07/24- Asynchronous with vent, with obstructive process/bronchospasm.  Steroids increased to 80 mg BID, Ketamine  gtt started, place A-line for frequent ABG's.  Interim History / Subjective:  As outlined above under Significant Hospital Events section  Objective   Blood pressure (!) 143/95, pulse 91, temperature 97.9 F (36.6 C), temperature source Axillary, resp. rate 14, height 5' 6 (1.676 m), weight 74.5 kg, SpO2 97%.    Vent Mode: PRVC FiO2 (%):  [30 %-60 %] 30 % Set Rate:  [20 bmp] 20 bmp Vt Set:  [480 mL] 480 mL PEEP:  [5 cmH20] 5 cmH20 Pressure Support:  [8 cmH20] 8 cmH20 Plateau Pressure:  [20 cmH20-22 cmH20] 20 cmH20   Intake/Output Summary (Last 24 hours) at 03/07/2024 0755 Last data filed at 03/07/2024 9246 Gross per 24 hour   Intake 2313.47 ml  Output 1035 ml  Net 1278.47 ml   Filed Weights   03/03/24 0500 03/04/24 0400 03/06/24 0451  Weight: 76 kg 74.5 kg 74.5 kg    Examination: General: Critically ill-appearing female, lying in bed, intubated and sedated, asynchronous with the vent HENT: Atraumatic, normocephalic, neck supple, positive JVD, orally intubated Lungs: Expiratory expiratory wheezing throughout, even, asynchronous with the Cardiovascular: Regular rate and rhythm, S1-S2, no murmurs, rubs, gallops Abdomen: Soft, nontender, nondistended, no guarding or tenderness, bowel sounds positive before Extremities: Normal bulk and tone, no deformities, no edema Neuro: Sedated, withdraws from pain, currently not following commands, PERRLA GU: Foley catheter in place  Resolved Hospital Problem list     Assessment & Plan:   #Acute Hypoxic & Hypercapnic Respiratory Failure due to ... #Strep Pneumoniae Pneumonia #COPD/Asthma Exacerbation  PMHx: Tobacco use  -Full vent support, implement lung protective strategies -Plateau pressures less than 30 cm H20 -Wean FiO2 & PEEP as tolerated to maintain O2 sats 88 to 92% -Follow intermittent Chest X-ray & ABG as needed -Spontaneous Breathing Trials when  respiratory parameters met and mental status permits -Implement VAP Bundle -Bronchodilators -IV Steroids (increase to 80 mg BID on 1/23) -IV ketamine  gtt -Will give 4 g Mag on 1/23 -ABX as above  #Sepsis due to ... #Strep Pneumoniae Pneumonia -Monitor fever curve -Trend WBC's & Procalcitonin -Follow cultures as above -Continue empiric Cefepime  pending cultures & sensitivities  #Hyperglycemia -CBG's q4h; Target range of 140 to 180 -SSI -Follow ICU Hypo/Hyperglycemia protocol  #Acute Metabolic Encephalopathy #Sedation needs in setting of mechanical ventilation PMHx: Seizure disorder, anxiety, depression, bipolar disorder, polysubstance abuse  -Maintain a RASS goal of -3 to -4 (allowing for deeper  sedation due to vent asynchrony) -Propofol  and dilaudid  to maintain RASS goal -Avoid sedating medications as able -Daily wake up assessment -Continue home Lamictal        Best Practice (right click and Reselect all SmartList Selections daily)   Diet/type: tubefeeds and NPO DVT prophylaxis: LMWH GI prophylaxis: PPI Lines: N/A Foley:  Yes, and it is still needed Code Status:  full code Last date of multidisciplinary goals of care discussion [1/23]  1/23: Pt's husband updated at bedside on plan of care.  He gives consent for A-line placement   Labs   CBC: Recent Labs  Lab 02/29/24 1833 03/01/24 0201 03/03/24 0346 03/04/24 0234 03/05/24 0420 03/06/24 0225 03/07/24 0413  WBC 10.7*   < > 11.0* 11.3* 10.3 10.5 12.1*  NEUTROABS 5.2  --   --   --   --   --   --   HGB 12.7   < > 10.5* 11.2* 10.5* 10.1* 10.9*  HCT 40.9   < > 33.6* 34.6* 33.9* 32.1* 36.6  MCV 94.9   < > 93.3 92.0 93.9 95.3 98.1  PLT 313   < > 260 272 283 264 272   < > = values in this interval not displayed.    Basic Metabolic Panel: Recent Labs  Lab 03/03/24 0346 03/04/24 0234 03/05/24 0420 03/06/24 0225 03/07/24 0413  NA 140 143 141 135 141  K 5.1 4.9 4.7 4.6 5.0  CL 103 104 106 102 104  CO2 28 29 28 25 29   GLUCOSE 154* 167* 178* 120* 131*  BUN 27* 31* 26* 25* 28*  CREATININE 1.14* 1.00 0.86 0.72 0.87  CALCIUM  9.0 9.5 9.3 8.3* 9.9  MG 2.8* 2.6* 2.3 2.2 2.2  PHOS 4.0 3.1 3.0 3.4 3.5   GFR: Estimated Creatinine Clearance: 72.8 mL/min (by C-G formula based on SCr of 0.87 mg/dL). Recent Labs  Lab 02/29/24 1834 02/29/24 2018 03/01/24 0201 03/04/24 0234 03/05/24 0420 03/06/24 0225 03/07/24 0413  PROCALCITON  --  <0.10  --   --  <0.10 <0.10  --   WBC  --   --    < > 11.3* 10.3 10.5 12.1*  LATICACIDVEN 1.7 1.7  --   --   --   --   --    < > = values in this interval not displayed.    Liver Function Tests: Recent Labs  Lab 02/29/24 1833  AST 23  ALT 20  ALKPHOS 90  BILITOT <0.2   PROT 7.1  ALBUMIN 4.6   No results for input(s): LIPASE, AMYLASE in the last 168 hours. No results for input(s): AMMONIA in the last 168 hours.  ABG    Component Value Date/Time   PHART 7.38 03/04/2024 1256   PCO2ART 60 (H) 03/04/2024 1256   PO2ART 89 03/04/2024 1256   HCO3 30.3 (H) 03/06/2024 0238   ACIDBASEDEF 0.8 03/01/2024 0432  O2SAT 80.6 03/06/2024 0238     Coagulation Profile: No results for input(s): INR, PROTIME in the last 168 hours.  Cardiac Enzymes: No results for input(s): CKTOTAL, CKMB, CKMBINDEX, TROPONINI in the last 168 hours.  HbA1C: Hemoglobin A1C  Date/Time Value Ref Range Status  09/21/2021 02:00 PM 6.4 (A) 4.0 - 5.6 % Final  03/22/2021 09:52 AM 6.2 (A) 4.0 - 5.6 % Final  01/15/2014 12:00 AM 6.8  Final   HbA1c, POC (prediabetic range)  Date/Time Value Ref Range Status  05/11/2020 10:30 AM 0 (A) 5.7 - 6.4 % Final   HbA1c, POC (controlled diabetic range)  Date/Time Value Ref Range Status  05/11/2020 10:30 AM 0.0 0.0 - 7.0 % Final   HbA1c POC (<> result, manual entry)  Date/Time Value Ref Range Status  05/11/2020 10:30 AM 6.5 4.0 - 5.6 % Final   Hgb A1c MFr Bld  Date/Time Value Ref Range Status  03/01/2024 02:02 AM 6.4 (H) 4.8 - 5.6 % Final    Comment:    (NOTE) Diagnosis of Diabetes The following HbA1c ranges recommended by the American Diabetes Association (ADA) may be used as an aid in the diagnosis of diabetes mellitus.  Hemoglobin             Suggested A1C NGSP%              Diagnosis  <5.7                   Non Diabetic  5.7-6.4                Pre-Diabetic  >6.4                   Diabetic  <7.0                   Glycemic control for                       adults with diabetes.    12/24/2019 11:08 AM 6.7 (H) 4.8 - 5.6 % Final    Comment:             Prediabetes: 5.7 - 6.4          Diabetes: >6.4          Glycemic control for adults with diabetes: <7.0     CBG: Recent Labs  Lab 03/06/24 1537  03/06/24 1936 03/06/24 2326 03/07/24 0335 03/07/24 0729  GLUCAP 106* 88 111* 122* 105*    Review of Systems:   Unable to assess due to intubation/sedation/critical illness    Past Medical History:  She,  has a past medical history of Allergy, Anxiety, Asthma, Bilateral carpal tunnel syndrome (12/31/2018), Bipolar affective, manic (HCC) (1996), COPD (chronic obstructive pulmonary disease) (HCC), Depression, HLD (hyperlipidemia) (03/12/2018), Lung nodule, multiple (09/26/2016), Manic depressive illness (HCC) (1996), Seizures (HCC), Substance abuse (HCC), and Tobacco abuse (10/04/2016).   Surgical History:   Past Surgical History:  Procedure Laterality Date   ABDOMINAL HYSTERECTOMY     CARPAL TUNNEL RELEASE Right 01/15/2019   Procedure: RIGHTCARPAL TUNNEL RELEASE, LEFT DEQUERVAINS INJECTION;  Surgeon: Jerri Kay HERO, MD;  Location: [facility];  Service: Orthopedics;  Laterality: Right;   CARPAL TUNNEL RELEASE Left 02/26/2019   Procedure: LEFT CARPAL TUNNEL RELEASE;  Surgeon: Jerri Kay HERO, MD;  Location: [facility];  Service: Orthopedics;  Laterality: Left;  Bier block   COLONOSCOPY WITH PROPOFOL  N/A 11/28/2018   Procedure: COLONOSCOPY  WITH PROPOFOL ;  Surgeon: Therisa Bi, MD;  Location[facility];  Service: Gastroenterology;  Laterality: N/A;   TUBAL LIGATION       Social History:   reports that she has been smoking cigarettes. Her smokeless tobacco use includes chew and snuff. She reports that she does not currently use alcohol. She reports that she does not use drugs.   Family History:  Her family history includes Bipolar disorder in her brother and brother; CAD in her father; Heart attack in her father; Other in her mother; Schizophrenia in her brother and brother; Stroke in her mother.   Allergies Allergies[1]   Home Medications  Prior to Admission medications  Medication Sig Start Date End Date Taking? Authorizing Provider  albuterol  (PROVENTIL   HFA) 108 (90 Base) MCG/ACT inhaler Inhale 2 puffs into the lungs every 4-6 hours as needed. 09/21/21  Yes Iloabachie, Chioma E, NP  amphetamine-dextroamphetamine (ADDERALL XR) 10 MG 24 hr capsule Take 10 mg by mouth daily.   Yes [provider]  citalopram  (CELEXA ) 40 MG tablet Take 1 tablet (40 mg total) by mouth daily. 11/21/22  Yes   Fluticasone  Furoate (ARNUITY ELLIPTA ) 200 MCG/ACT AEPB Inhale 1 puff into the lungs daily. 12/06/23  Yes Dgayli, Belva, MD  hydrOXYzine  (ATARAX ) 50 MG tablet Take 1-1.5 tablets (50-75 mg total) by mouth at bedtime as needed for sleep 11/21/22  Yes   lamoTRIgine  (LAMICTAL ) 100 MG tablet Take 1 tablet (100 mg total) by mouth daily. 11/21/22  Yes   rosuvastatin  (CRESTOR ) 10 MG tablet Take 10 mg by mouth at bedtime. 08/13/23  Yes [provider]  traMADol (ULTRAM) 50 MG tablet Take 1 tablet by mouth every 6 (six) hours as needed.   Yes [provider]  umeclidinium-vilanterol (ANORO ELLIPTA ) 62.5-25 MCG/ACT AEPB Inhale 1 puff into the lungs daily. 12/06/23  Yes Dgayli, Belva, MD  ipratropium-albuterol  (DUONEB) 0.5-2.5 (3) MG/3ML SOLN INHALE CONTENTS OF ONE VIAL ( TOTAL) USING NEBULIZER  ONCE EVERY 6 HOURS. 11/02/20 12/06/23  Iloabachie, Chioma E, NP  atomoxetine  (STRATTERA ) 25 MG capsule Take 1 capsule (25 mg total) by mouth once every evening after dinner. Patient not taking: Reported on 12/23/2020 10/26/20 03/09/21    loratadine  (CLARITIN ) 10 MG tablet Take 1 tablet (10 mg total) by mouth daily as needed for allergies. 12/16/19 12/25/19  Iloabachie, Chioma E, NP     Critical care time: 45 minutes     Inge Lecher, AGACNP-BC [facility] Prefer epic messenger for cross cover needs If after hours, please call E-link       [1]  Allergies Allergen Reactions   Flax Seed Oil [Flax Seed Oil] Hives    Pt. Self reported   Other Hives    Pt. Self reported   Trazodone Other (See Comments)    Bed wetting  Other  Reaction(s): Other (See Comments)    Bed wetting   "

## 2024-03-07 NOTE — Progress Notes (Signed)
Dear Doctor:  This patient has been identified as a candidate for PICC for the following reason (s): IV therapy over 48 hours If you agree, please write an order for the indicated device. For any questions contact the Vascular Access Team at [phone] if no answer, please leave a message.  Thank you for supporting the early vascular access assessment program. 

## 2024-03-07 NOTE — Plan of Care (Signed)
 Patient remains intubated and sedated. Family visited and updated. During the bath around 2200, the patient became agitated, fighting the ventilator, O2 dropped in 80s with increased Ppeak. I administered PRN fentanyl  twice and versed  once with no relief. FiO2 was also increased to 60 and was documented. I titrated up on prop to 60. Provider was notified and a one time paralytic dose was ordered and given. Patient's Ppeak and volumes returned to normal. Patient is currently satting 98% on 30% FiO2. Vital signs remain stable.  Problem: Activity: Goal: Risk for activity intolerance will decrease Outcome: Progressing   Problem: Nutrition: Goal: Adequate nutrition will be maintained Outcome: Progressing   Problem: Coping: Goal: Level of anxiety will decrease Outcome: Progressing   Problem: Elimination: Goal: Will not experience complications related to urinary retention Outcome: Progressing   Problem: Pain Managment: Goal: General experience of comfort will improve and/or be controlled Outcome: Progressing   Problem: Safety: Goal: Ability to remain free from injury will improve Outcome: Progressing   Problem: Tissue Perfusion: Goal: Adequacy of tissue perfusion will improve Outcome: Progressing   Problem: Education: Goal: Knowledge of General Education information will improve Description: Including pain rating scale, medication(s)/side effects and non-pharmacologic comfort measures Outcome: Not Progressing   Problem: Clinical Measurements: Goal: Respiratory complications will improve Outcome: Not Progressing   Problem: Elimination: Goal: Will not experience complications related to bowel motility Outcome: Not Progressing

## 2024-03-07 NOTE — Plan of Care (Signed)
" °  Problem: Clinical Measurements: Goal: Will remain free from infection Outcome: Progressing Goal: Respiratory complications will improve Outcome: Progressing Goal: Cardiovascular complication will be avoided Outcome: Progressing   Problem: Nutrition: Goal: Adequate nutrition will be maintained Outcome: Progressing   Problem: Elimination: Goal: Will not experience complications related to bowel motility Outcome: Progressing   Problem: Pain Managment: Goal: General experience of comfort will improve and/or be controlled Outcome: Progressing   "

## 2024-03-08 ENCOUNTER — Inpatient Hospital Stay: Payer: MEDICAID

## 2024-03-08 DIAGNOSIS — J4552 Severe persistent asthma with status asthmaticus: Secondary | ICD-10-CM

## 2024-03-08 DIAGNOSIS — G928 Other toxic encephalopathy: Secondary | ICD-10-CM

## 2024-03-08 DIAGNOSIS — J45902 Unspecified asthma with status asthmaticus: Secondary | ICD-10-CM

## 2024-03-08 DIAGNOSIS — F17210 Nicotine dependence, cigarettes, uncomplicated: Secondary | ICD-10-CM

## 2024-03-08 DIAGNOSIS — J441 Chronic obstructive pulmonary disease with (acute) exacerbation: Secondary | ICD-10-CM

## 2024-03-08 DIAGNOSIS — D72829 Elevated white blood cell count, unspecified: Secondary | ICD-10-CM

## 2024-03-08 LAB — BLOOD GAS, ARTERIAL
Acid-Base Excess: 0.9 mmol/L (ref 0.0–2.0)
Acid-Base Excess: 1 mmol/L (ref 0.0–2.0)
Acid-Base Excess: 1.2 mmol/L (ref 0.0–2.0)
Acid-base deficit: 1.5 mmol/L (ref 0.0–2.0)
Acid-base deficit: 0.2 mmol/L (ref 0.0–2.0)
Acid-base deficit: 0.9 mmol/L (ref 0.0–2.0)
Bicarbonate: 29.4 mmol/L — ABNORMAL HIGH (ref 20.0–28.0)
Bicarbonate: 29.6 mmol/L — ABNORMAL HIGH (ref 20.0–28.0)
Bicarbonate: 28.9 mmol/L — ABNORMAL HIGH (ref 20.0–28.0)
Bicarbonate: 27.1 mmol/L (ref 20.0–28.0)
Bicarbonate: 25.9 mmol/L (ref 20.0–28.0)
Bicarbonate: 25.8 mmol/L (ref 20.0–28.0)
FIO2: 30 %
FIO2: 30 %
FIO2: 30 %
FIO2: 30 %
FIO2: 30 %
FIO2: 30 %
MECHVT: 420 mL
MECHVT: 420 mL
MECHVT: 420 mL
Mechanical Rate: 24
Mechanical Rate: 24
Mechanical Rate: 24
Mechanical Rate: 24
Mechanical Rate: 24
Mechanical Rate: 24
O2 Saturation: 93.3 %
O2 Saturation: 91.4 %
O2 Saturation: 87.7 %
O2 Saturation: 98.3 %
O2 Saturation: 92.4 %
O2 Saturation: 93.2 %
PEEP: 5 cmH2O
PEEP: 5 cmH2O
PEEP: 5 cmH2O
PEEP: 5 cmH2O
PEEP: 5 cmH2O
PEEP: 5 cmH2O
Patient temperature: 37
Patient temperature: 37
Patient temperature: 37
Patient temperature: 37
Patient temperature: 37
Patient temperature: 37
Pressure control: 20 cmH2O
Pressure control: 20 cmH2O
Pressure control: 20 cmH2O
pCO2 arterial: 67 mmHg (ref 32–48)
pCO2 arterial: 69 mmHg (ref 32–48)
pCO2 arterial: 83 mmHg (ref 32–48)
pCO2 arterial: 48 mmHg (ref 32–48)
pCO2 arterial: 47 mmHg (ref 32–48)
pCO2 arterial: 50 mmHg — ABNORMAL HIGH (ref 32–48)
pH, Arterial: 7.25 — ABNORMAL LOW (ref 7.35–7.45)
pH, Arterial: 7.24 — ABNORMAL LOW (ref 7.35–7.45)
pH, Arterial: 7.15 — CL (ref 7.35–7.45)
pH, Arterial: 7.36 (ref 7.35–7.45)
pH, Arterial: 7.35 (ref 7.35–7.45)
pH, Arterial: 7.32 — ABNORMAL LOW (ref 7.35–7.45)
pO2, Arterial: 64 mmHg — ABNORMAL LOW (ref 83–108)
pO2, Arterial: 61 mmHg — ABNORMAL LOW (ref 83–108)
pO2, Arterial: 58 mmHg — ABNORMAL LOW (ref 83–108)
pO2, Arterial: 104 mmHg (ref 83–108)
pO2, Arterial: 58 mmHg — ABNORMAL LOW (ref 83–108)
pO2, Arterial: 62 mmHg — ABNORMAL LOW (ref 83–108)

## 2024-03-08 LAB — PHOSPHORUS: Phosphorus: 5.4 mg/dL — ABNORMAL HIGH (ref 2.5–4.6)

## 2024-03-08 LAB — BASIC METABOLIC PANEL WITH GFR
Anion gap: 8 (ref 5–15)
BUN: 35 mg/dL — ABNORMAL HIGH (ref 6–20)
CO2: 28 mmol/L (ref 22–32)
Calcium: 9.4 mg/dL (ref 8.9–10.3)
Chloride: 106 mmol/L (ref 98–111)
Creatinine, Ser: 1.15 mg/dL — ABNORMAL HIGH (ref 0.44–1.00)
GFR, Estimated: 55 mL/min — ABNORMAL LOW
Glucose, Bld: 170 mg/dL — ABNORMAL HIGH (ref 70–99)
Potassium: 5.4 mmol/L — ABNORMAL HIGH (ref 3.5–5.1)
Sodium: 142 mmol/L (ref 135–145)

## 2024-03-08 LAB — MAGNESIUM: Magnesium: 3.2 mg/dL — ABNORMAL HIGH (ref 1.7–2.4)

## 2024-03-08 LAB — CBC
HCT: 36 % (ref 36.0–46.0)
Hemoglobin: 10.8 g/dL — ABNORMAL LOW (ref 12.0–15.0)
MCH: 29.8 pg (ref 26.0–34.0)
MCHC: 30 g/dL (ref 30.0–36.0)
MCV: 99.4 fL (ref 80.0–100.0)
Platelets: 302 10*3/uL (ref 150–400)
RBC: 3.62 MIL/uL — ABNORMAL LOW (ref 3.87–5.11)
RDW: 14 % (ref 11.5–15.5)
WBC: 13.9 10*3/uL — ABNORMAL HIGH (ref 4.0–10.5)
nRBC: 0 % (ref 0.0–0.2)

## 2024-03-08 LAB — POTASSIUM: Potassium: 5 mmol/L (ref 3.5–5.1)

## 2024-03-08 LAB — GLUCOSE, CAPILLARY
Glucose-Capillary: 119 mg/dL — ABNORMAL HIGH (ref 70–99)
Glucose-Capillary: 124 mg/dL — ABNORMAL HIGH (ref 70–99)
Glucose-Capillary: 128 mg/dL — ABNORMAL HIGH (ref 70–99)
Glucose-Capillary: 143 mg/dL — ABNORMAL HIGH (ref 70–99)
Glucose-Capillary: 143 mg/dL — ABNORMAL HIGH (ref 70–99)
Glucose-Capillary: 155 mg/dL — ABNORMAL HIGH (ref 70–99)

## 2024-03-08 LAB — TRIGLYCERIDES: Triglycerides: 174 mg/dL — ABNORMAL HIGH

## 2024-03-08 MED ORDER — HEPARIN SODIUM (PORCINE) 5000 UNIT/ML IJ SOLN
5000.0000 [IU] | Freq: Three times a day (TID) | INTRAMUSCULAR | Status: DC
  Administered 2024-03-08 – 2024-03-12 (×12): 5000 [IU] via SUBCUTANEOUS
  Filled 2024-03-08 (×12): qty 1

## 2024-03-08 MED ORDER — MAGNESIUM SULFATE 4 GM/100ML IV SOLN
4.0000 g | Freq: Once | INTRAVENOUS | Status: AC
  Administered 2024-03-08: 4 g via INTRAVENOUS
  Filled 2024-03-08: qty 100

## 2024-03-08 MED ORDER — SODIUM CHLORIDE 0.45 % IV SOLN: INTRAVENOUS | Status: DC

## 2024-03-08 MED ORDER — ARTIFICIAL TEARS OPHTHALMIC OINT
1.0000 | TOPICAL_OINTMENT | Freq: Three times a day (TID) | OPHTHALMIC | Status: DC
  Administered 2024-03-08 – 2024-03-12 (×12): 1 via OPHTHALMIC
  Filled 2024-03-08: qty 1

## 2024-03-08 MED ORDER — HYDROMORPHONE BOLUS VIA INFUSION
0.5000 mg | INTRAVENOUS | Status: DC | PRN
  Administered 2024-03-08 – 2024-03-11 (×5): 0.5 mg via INTRAVENOUS

## 2024-03-08 MED ORDER — PANTOPRAZOLE SODIUM 40 MG IV SOLR
40.0000 mg | Freq: Every day | INTRAVENOUS | Status: AC
  Administered 2024-03-08 – 2024-03-21 (×14): 40 mg via INTRAVENOUS
  Filled 2024-03-08 (×14): qty 10

## 2024-03-08 MED ORDER — HYDROMORPHONE HCL 1 MG/ML IJ SOLN
1.0000 mg | Freq: Once | INTRAMUSCULAR | Status: AC
  Administered 2024-03-08: 1 mg via INTRAVENOUS
  Filled 2024-03-08: qty 1

## 2024-03-08 MED ORDER — SODIUM ZIRCONIUM CYCLOSILICATE 5 G PO PACK
10.0000 g | PACK | Freq: Three times a day (TID) | ORAL | Status: AC
  Administered 2024-03-08 (×2): 10 g
  Filled 2024-03-08 (×2): qty 2

## 2024-03-08 MED ORDER — CISATRACURIUM BOLUS VIA INFUSION
0.1000 mg/kg | Freq: Once | INTRAVENOUS | Status: AC
  Administered 2024-03-08: 7.5 mg via INTRAVENOUS
  Filled 2024-03-08: qty 8

## 2024-03-08 MED ORDER — IPRATROPIUM-ALBUTEROL 0.5-2.5 (3) MG/3ML IN SOLN
3.0000 mL | RESPIRATORY_TRACT | Status: DC
  Administered 2024-03-08 – 2024-03-12 (×23): 3 mL via RESPIRATORY_TRACT
  Filled 2024-03-08 (×22): qty 3

## 2024-03-08 MED ORDER — ALBUTEROL SULFATE (2.5 MG/3ML) 0.083% IN NEBU
10.0000 mg/h | INHALATION_SOLUTION | RESPIRATORY_TRACT | Status: AC
  Administered 2024-03-08: 10 mg/h via RESPIRATORY_TRACT
  Filled 2024-03-08: qty 3

## 2024-03-08 MED ORDER — PROPOFOL 1000 MG/100ML IV EMUL
0.0000 ug/kg/min | INTRAVENOUS | Status: DC
  Administered 2024-03-08 (×3): 70 ug/kg/min via INTRAVENOUS
  Administered 2024-03-09: 50 ug/kg/min via INTRAVENOUS
  Administered 2024-03-09: 80 ug/kg/min via INTRAVENOUS
  Administered 2024-03-09 (×5): 70 ug/kg/min via INTRAVENOUS
  Administered 2024-03-09: 60 ug/kg/min via INTRAVENOUS
  Administered 2024-03-10 – 2024-03-12 (×23): 80 ug/kg/min via INTRAVENOUS
  Administered 2024-03-12: 70 ug/kg/min via INTRAVENOUS
  Administered 2024-03-12: 80 ug/kg/min via INTRAVENOUS
  Administered 2024-03-13: 30 ug/kg/min via INTRAVENOUS
  Administered 2024-03-14: 40 ug/kg/min via INTRAVENOUS
  Administered 2024-03-14: 30 ug/kg/min via INTRAVENOUS
  Administered 2024-03-14: 50 ug/kg/min via INTRAVENOUS
  Administered 2024-03-14: 40 ug/kg/min via INTRAVENOUS
  Administered 2024-03-15 (×3): 60 ug/kg/min via INTRAVENOUS
  Administered 2024-03-15: 80 ug/kg/min via INTRAVENOUS
  Administered 2024-03-15 – 2024-03-16 (×8): 60 ug/kg/min via INTRAVENOUS
  Administered 2024-03-17: 40 ug/kg/min via INTRAVENOUS
  Administered 2024-03-17 (×6): 60 ug/kg/min via INTRAVENOUS
  Administered 2024-03-18: 50 ug/kg/min via INTRAVENOUS
  Administered 2024-03-18 (×3): 40 ug/kg/min via INTRAVENOUS
  Administered 2024-03-19 (×2): 45 ug/kg/min via INTRAVENOUS
  Administered 2024-03-19: 25 ug/kg/min via INTRAVENOUS
  Administered 2024-03-19: 40 ug/kg/min via INTRAVENOUS
  Administered 2024-03-19: 25 ug/kg/min via INTRAVENOUS
  Administered 2024-03-20: 20 ug/kg/min via INTRAVENOUS
  Filled 2024-03-08 (×74): qty 100

## 2024-03-08 MED ORDER — HYDROMORPHONE HCL-NACL 50-0.9 MG/50ML-% IV SOLN
1.0000 mg/h | INTRAVENOUS | Status: DC
  Administered 2024-03-08: 1 mg/h via INTRAVENOUS
  Administered 2024-03-09: 2 mg/h via INTRAVENOUS
  Administered 2024-03-09: 1 mg/h via INTRAVENOUS
  Administered 2024-03-10: 2.5 mg/h via INTRAVENOUS
  Administered 2024-03-11 – 2024-03-12 (×4): 3 mg/h via INTRAVENOUS
  Filled 2024-03-08 (×5): qty 50

## 2024-03-08 MED ORDER — SODIUM CHLORIDE 0.9 % IV BOLUS
1000.0000 mL | Freq: Once | INTRAVENOUS | Status: AC
  Administered 2024-03-08: 1000 mL via INTRAVENOUS

## 2024-03-08 MED ORDER — SODIUM CHLORIDE 0.9 % IV SOLN
0.0000 ug/kg/min | INTRAVENOUS | Status: DC
  Administered 2024-03-08: 3 ug/kg/min via INTRAVENOUS
  Administered 2024-03-09: 3.5 ug/kg/min via INTRAVENOUS
  Filled 2024-03-08 (×2): qty 20

## 2024-03-08 MED ORDER — METHYLPREDNISOLONE SODIUM SUCC 125 MG IJ SOLR
125.0000 mg | Freq: Once | INTRAMUSCULAR | Status: AC
  Administered 2024-03-08: 125 mg via INTRAVENOUS
  Filled 2024-03-08: qty 2

## 2024-03-08 MED ORDER — SODIUM CHLORIDE 0.9 % IV SOLN
2.0000 g | Freq: Two times a day (BID) | INTRAVENOUS | Status: DC
  Administered 2024-03-08: 2 g via INTRAVENOUS
  Filled 2024-03-08 (×2): qty 12.5

## 2024-03-08 NOTE — Progress Notes (Signed)
 "  NAME:  Erica Gomez, MRN:  [id number], DOB:  02/22/1965, LOS: 8 ADMISSION DATE:  02/29/2024, CONSULTATION DATE:  02/29/24 REFERRING MD:  Dr. Willo, CHIEF COMPLAINT:  Acute Respiratory Distress    Brief Pt Description / Synopsis:  59 y.o. female admitted with Acute Hypoxic and Hypercapnic Respiratory Failure due to Strep Pneumoniae Pneumonia, severe COPD/Asthma Exacerbation requiring intubation and mechanical ventilation.   History of Present Illness:  59 y/o F with PMH sig for asthma, COPD, tobacco use disorder, bipolar disorder, seizure, polysubstance abuse, anxiety/depression, and HLD presented to ED BIB [facility] from home for acute respiratory distress.   On Chart review, patient follows with pulmonologist; last visit with Dr. Isadora on 10/25. Meds prescribed at visit included fluticasone  furoate (Arnuity Ellipta ) 200 mcg 1 puff daily and umeclidinium-vilanterol (Anoro Ellipta ) 62.5/25 mcg 1 puff daily. Per husband, patient has been taking medications as prescribed. EMS tx included MgSO? 2 g IV, Solu-Medrol  125 mg IV, DuoNeb x1, and albuterol  x1. During transport, pt became combative/confused and received Haldol 4 mg IM and Versed  5 mg IM. Initially A&O with EMS. SpO? 94% on RA.   ED Course: Initial VS: BP 211/120, HR 142, SpO? 94% on RA. Patient was unresponsive with labored/heaving respirations and in severe resp distress. Concern for hypercapnic resp failure contributing to agitation and AMS. Pt subsequently lost ability to protect airway. Given worsening MS and resp failure, decision made for emergent intubation. Pt received ketamine  and rocuronium  and was intubated successfully without complication.   Labs/Imaging: Labs: WBC 10.7, Hgb 12.7, Hct 40.9, Plt 313. BMP: Na 141, K 4.9, Cl 106, CO? 25, BUN 14, Cr 0.81, Glu 147, Ca 8.9. LFTs WNL: AST 23, ALT 20, Alk Phos 90, Tbili <0.2, Alb 4.6. VBG c/w resp acidosis/hypercapnia (HCO? 29.8), O? sat 98.3. Lactate 1.7. CXR: no acute cardiopulm process;  ETT in mid trachea, enteric tube in stomach. CT head ordered and pending for AMS. PCCM asked to admit for further workup and treatment.  Please see Significant Hospital Events section below for full detailed hospital course.   Pertinent  Medical History   Past Medical History:  Diagnosis Date   Allergy    Anxiety    Asthma    Bilateral carpal tunnel syndrome 12/31/2018   Bipolar affective, manic (HCC) 1996   COPD (chronic obstructive pulmonary disease) (HCC)    Depression    HLD (hyperlipidemia) 03/12/2018   Lung nodule, multiple 09/26/2016   Manic depressive illness (HCC) 1996   Seizures (HCC)    Substance abuse (HCC)    Tobacco abuse 10/04/2016    Micro Data:  02/29/24 SARS-CoV-2 PCR ? negative 02/29/24 Flu PCR ? negative 02/29/24 RVP ? negative 02/29/24 BCx2 ? no growth 02/29/24 UCx ? 02/29/24 MRSA PCR ? negative 02/29/24 Strep pneumo Ur Ag + 02/29/24 Legionella Ur Ag ?  negative 02/29/24: Respiratory viral panel>> negative 03/01/24: Tracheal aspirate>> normal flora 03/05/24: Repeat tracheal aspirate>> normal flora   Antimicrobials:   Anti-infectives (From admission, onward)    Start     Dose/Rate Route Frequency Ordered Stop   03/06/24 0000  ceFEPIme  (MAXIPIME ) 2 g in sodium chloride  0.9 % 100 mL IVPB        2 g 200 mL/hr over 30 Minutes Intravenous Every 8 hours 03/05/24 2329     03/05/24 0200  cefTRIAXone  (ROCEPHIN ) 2 g in sodium chloride  0.9 % 100 mL IVPB  Status:  Discontinued        2 g 200 mL/hr over 30 Minutes Intravenous Every  24 hours 03/05/24 0050 03/05/24 2330   03/01/24 1000  doxycycline  (VIBRAMYCIN ) 100 mg in sodium chloride  0.9 % 250 mL IVPB  Status:  Discontinued        100 mg 125 mL/hr over 120 Minutes Intravenous Every 12 hours 03/01/24 0826 03/01/24 1003   03/01/24 0200  cefTRIAXone  (ROCEPHIN ) 1 g in sodium chloride  0.9 % 100 mL IVPB  Status:  Discontinued        1 g 200 mL/hr over 30 Minutes Intravenous Every 24 hours 03/01/24 0041 03/05/24  0050       Significant Hospital Events: Including procedures, antibiotic start and stop dates in addition to other pertinent events   1/16: Admitted to ICU for asthma/COPD overlap exacerbation with hypercapnic respiratory failure requiring emergent intubation. 03/01/24- patient failed SBT today, remains on mV. She has AKI.   Strep pneumoniae +, will continue Rocephin .  Husband at bedside I met with him and we reviewed medical plan.  03/02/24- patient had resp distress overnight.  She still has AKI but there is an interval improvement.  03/03/24- patient for SBT today.  AKI has improved this am.  She requires heavy sedation. She has family coming in this afternoon who wishes to be present for SBT. 03/04/24- patient passed SBT and was extubated but after 1 hour started to have respiratory distress with severe hypoxemia. Her daughter was present and requested re-intubation. We continue to treat her underlying Asthma COPD exacerbation with strep pneumoniae respiratory infection.  03/05/24- Patient failed SBT today with tachypnea/tachycardia/resp distress 03/06/24- Patient failed SBT and husband was present during evaluation, she was tachypneic and tachycardic. 03/07/24- Asynchronous with vent, with obstructive process/bronchospasm.  Steroids increased to 80 mg BID, Ketamine  gtt started, place A-line for frequent ABG's.  Interim History / Subjective:  As outlined above under Significant Hospital Events section  Objective   Blood pressure 102/61, pulse 80, temperature 98.1 F (36.7 C), resp. rate (!) 26, height 5' 6 (1.676 m), weight 74.5 kg, SpO2 97%.    Vent Mode: PCV FiO2 (%):  [30 %] 30 % Set Rate:  [18 bmp-24 bmp] 24 bmp PEEP:  [5 cmH20] 5 cmH20   Intake/Output Summary (Last 24 hours) at 03/08/2024 0912 Last data filed at 03/08/2024 9192 Gross per 24 hour  Intake 3562.92 ml  Output 1975 ml  Net 1587.92 ml   Filed Weights   03/04/24 0400 03/06/24 0451  Weight: 74.5 kg 74.5 kg     Examination: General: Critically ill-appearing female, lying in bed, intubated and sedated, asynchronous with the vent HENT: Atraumatic, normocephalic, neck supple, positive JVD, orally intubated Lungs: Expiratory expiratory wheezing throughout, even, asynchronous with the Cardiovascular: Regular rate and rhythm, S1-S2, no murmurs, rubs, gallops Abdomen: Soft, nontender, nondistended, no guarding or tenderness, bowel sounds positive before Extremities: Normal bulk and tone, no deformities, no edema Neuro: Sedated, withdraws from pain, currently not following commands, PERRLA GU: Foley catheter in place  Resolved Hospital Problem list     Assessment & Plan:   #Acute Hypoxic & Hypercapnic Respiratory Failure due to ... #Strep Pneumoniae Pneumonia #COPD/Asthma Exacerbation  PMHx: Tobacco use  Continues to auto-PEEP on vent, intermittent low Vt -Full vent support, implement lung protective strategies -Plateau pressures less than 30 cm H20 -Wean FiO2 & PEEP as tolerated to maintain O2 sats 88 to 92% -Follow intermittent Chest X-ray & ABG as needed -Spontaneous Breathing Trials when respiratory parameters met and mental status permits -Implement VAP Bundle -Duoneb -IV Steroids (increase to 80 mg BID on 1/23) -IV  ketamine  gtt 1-3 -ABX as above  #Sepsis due to ... #Strep Pneumoniae Pneumonia #Leukocytosis -Monitor fever curve, currently afebrile -Trend WBC's  -Follow cultures as above -Continue empiric Cefepime  pending cultures & sensitivities  #Hyperglycemia -CBG's q4h; Target range of 140 to 180 -SSI -Follow ICU Hypo/Hyperglycemia protocol  #Acute Metabolic Encephalopathy #Sedation needs in setting of mechanical ventilation PMHx: Seizure disorder, anxiety, depression, bipolar disorder, polysubstance abuse  -Maintain a RASS goal of -3 to -4 (allowing for deeper sedation due to vent asynchrony) -Propofol , ketamine  and dilaudid  to maintain RASS goal -Avoid sedating  medications as able -Daily wake up assessment deferred -Continue home Lamictal  100 mg daily    Best Practice (right click and Reselect all SmartList Selections daily)   Diet/type: tubefeeds and NPO DVT prophylaxis: LMWH GI prophylaxis: PPI Lines: N/A Foley:  Yes, and it is still needed Code Status:  full code Last date of multidisciplinary goals of care discussion [1/23]  1/23: Pt's husband updated at bedside on plan of care.    Labs   CBC: Recent Labs  Lab 03/04/24 0234 03/05/24 0420 03/06/24 0225 03/07/24 0413 03/08/24 0504  WBC 11.3* 10.3 10.5 12.1* 13.9*  HGB 11.2* 10.5* 10.1* 10.9* 10.8*  HCT 34.6* 33.9* 32.1* 36.6 36.0  MCV 92.0 93.9 95.3 98.1 99.4  PLT 272 283 264 272 302    Basic Metabolic Panel: Recent Labs  Lab 03/04/24 0234 03/05/24 0420 03/06/24 0225 03/07/24 0413 03/08/24 0504  NA 143 141 135 141 142  K 4.9 4.7 4.6 5.0 5.4*  CL 104 106 102 104 106  CO2 29 28 25 29 28   GLUCOSE 167* 178* 120* 131* 170*  BUN 31* 26* 25* 28* 35*  CREATININE 1.00 0.86 0.72 0.87 1.15*  CALCIUM  9.5 9.3 8.3* 9.9 9.4  MG 2.6* 2.3 2.2 2.2 3.2*  PHOS 3.1 3.0 3.4 3.5 5.4*   GFR: Estimated Creatinine Clearance: 55.1 mL/min (A) (by C-G formula based on SCr of 1.15 mg/dL (H)). Recent Labs  Lab 03/05/24 0420 03/06/24 0225 03/07/24 0413 03/08/24 0504  PROCALCITON <0.10 <0.10  --   --   WBC 10.3 10.5 12.1* 13.9*    Liver Function Tests: Recent Labs  Lab 03/07/24 0410  AST 26  ALT 28  ALKPHOS 57  BILITOT <0.2  PROT 6.2*  ALBUMIN 3.5   No results for input(s): LIPASE, AMYLASE in the last 168 hours. No results for input(s): AMMONIA in the last 168 hours.  ABG    Component Value Date/Time   PHART 7.25 (L) 03/08/2024 0817   PCO2ART 67 (HH) 03/08/2024 0817   PO2ART 64 (L) 03/08/2024 0817   HCO3 29.4 (H) 03/08/2024 0817   ACIDBASEDEF 0.3 03/07/2024 1715   O2SAT 93.3 03/08/2024 0817     Coagulation Profile: No results for input(s): INR, PROTIME  in the last 168 hours.  Cardiac Enzymes: No results for input(s): CKTOTAL, CKMB, CKMBINDEX, TROPONINI in the last 168 hours.  HbA1C: Hemoglobin A1C  Date/Time Value Ref Range Status  09/21/2021 02:00 PM 6.4 (A) 4.0 - 5.6 % Final  03/22/2021 09:52 AM 6.2 (A) 4.0 - 5.6 % Final  01/15/2014 12:00 AM 6.8  Final   HbA1c, POC (prediabetic range)  Date/Time Value Ref Range Status  05/11/2020 10:30 AM 0 (A) 5.7 - 6.4 % Final   HbA1c, POC (controlled diabetic range)  Date/Time Value Ref Range Status  05/11/2020 10:30 AM 0.0 0.0 - 7.0 % Final   HbA1c POC (<> result, manual entry)  Date/Time Value Ref Range Status  05/11/2020  10:30 AM 6.5 4.0 - 5.6 % Final   Hgb A1c MFr Bld  Date/Time Value Ref Range Status  03/01/2024 02:02 AM 6.4 (H) 4.8 - 5.6 % Final    Comment:    (NOTE) Diagnosis of Diabetes The following HbA1c ranges recommended by the American Diabetes Association (ADA) may be used as an aid in the diagnosis of diabetes mellitus.  Hemoglobin             Suggested A1C NGSP%              Diagnosis  <5.7                   Non Diabetic  5.7-6.4                Pre-Diabetic  >6.4                   Diabetic  <7.0                   Glycemic control for                       adults with diabetes.    12/24/2019 11:08 AM 6.7 (H) 4.8 - 5.6 % Final    Comment:             Prediabetes: 5.7 - 6.4          Diabetes: >6.4          Glycemic control for adults with diabetes: <7.0     CBG: Recent Labs  Lab 03/07/24 1544 03/07/24 1943 03/08/24 0034 03/08/24 0353 03/08/24 0725  GLUCAP 139* 108* 155* 143* 143*    Review of Systems:   Unable to assess due to intubation/sedation/critical illness    Past Medical History:  She,  has a past medical history of Allergy, Anxiety, Asthma, Bilateral carpal tunnel syndrome (12/31/2018), Bipolar affective, manic (HCC) (1996), COPD (chronic obstructive pulmonary disease) (HCC), Depression, HLD (hyperlipidemia) (03/12/2018), Lung  nodule, multiple (09/26/2016), Manic depressive illness (HCC) (1996), Seizures (HCC), Substance abuse (HCC), and Tobacco abuse (10/04/2016).   Surgical History:   Past Surgical History:  Procedure Laterality Date   ABDOMINAL HYSTERECTOMY     CARPAL TUNNEL RELEASE Right 01/15/2019   Procedure: RIGHTCARPAL TUNNEL RELEASE, LEFT DEQUERVAINS INJECTION;  Surgeon: Jerri Kay HERO, MD;  Location: [facility];  Service: Orthopedics;  Laterality: Right;   CARPAL TUNNEL RELEASE Left 02/26/2019   Procedure: LEFT CARPAL TUNNEL RELEASE;  Surgeon: Jerri Kay HERO, MD;  Location: [facility];  Service: Orthopedics;  Laterality: Left;  Bier block   COLONOSCOPY WITH PROPOFOL  N/A 11/28/2018   Procedure: COLONOSCOPY WITH PROPOFOL ;  Surgeon: Therisa Bi, MD;  Location[facility];  Service: Gastroenterology;  Laterality: N/A;   TUBAL LIGATION       Social History:   reports that she has been smoking cigarettes. Her smokeless tobacco use includes chew and snuff. She reports that she does not currently use alcohol. She reports that she does not use drugs.   Family History:  Her family history includes Bipolar disorder in her brother and brother; CAD in her father; Heart attack in her father; Other in her mother; Schizophrenia in her brother and brother; Stroke in her mother.   Allergies Allergies[1]   Home Medications  Prior to Admission medications  Medication Sig Start Date End Date Taking? Authorizing Provider  albuterol  (PROVENTIL  HFA) 108 (90 Base) MCG/ACT inhaler Inhale 2 puffs into  the lungs every 4-6 hours as needed. 09/21/21  Yes Iloabachie, Chioma E, NP  amphetamine-dextroamphetamine (ADDERALL XR) 10 MG 24 hr capsule Take 10 mg by mouth daily.   Yes [provider]  citalopram  (CELEXA ) 40 MG tablet Take 1 tablet (40 mg total) by mouth daily. 11/21/22  Yes   Fluticasone  Furoate (ARNUITY ELLIPTA ) 200 MCG/ACT AEPB Inhale 1 puff into the lungs daily. 12/06/23  Yes Dgayli,  Belva, MD  hydrOXYzine  (ATARAX ) 50 MG tablet Take 1-1.5 tablets (50-75 mg total) by mouth at bedtime as needed for sleep 11/21/22  Yes   lamoTRIgine  (LAMICTAL ) 100 MG tablet Take 1 tablet (100 mg total) by mouth daily. 11/21/22  Yes   rosuvastatin  (CRESTOR ) 10 MG tablet Take 10 mg by mouth at bedtime. 08/13/23  Yes [provider]  traMADol (ULTRAM) 50 MG tablet Take 1 tablet by mouth every 6 (six) hours as needed.   Yes [provider]  umeclidinium-vilanterol (ANORO ELLIPTA ) 62.5-25 MCG/ACT AEPB Inhale 1 puff into the lungs daily. 12/06/23  Yes Dgayli, Belva, MD  ipratropium-albuterol  (DUONEB) 0.5-2.5 (3) MG/3ML SOLN INHALE CONTENTS OF ONE VIAL ( TOTAL) USING NEBULIZER  ONCE EVERY 6 HOURS. 11/02/20 12/06/23  Iloabachie, Chioma E, NP  atomoxetine  (STRATTERA ) 25 MG capsule Take 1 capsule (25 mg total) by mouth once every evening after dinner. Patient not taking: Reported on 12/23/2020 10/26/20 03/09/21    loratadine  (CLARITIN ) 10 MG tablet Take 1 tablet (10 mg total) by mouth daily as needed for allergies. 12/16/19 12/25/19  Iloabachie, Chioma E, NP     Critical care time: 75 minutes     Felton Buczynski, ACNP-BC Pulmonary Critical Care, [place] Phone: [phone]         [1]  Allergies Allergen Reactions   Flax Seed Oil [Flax Seed Oil] Hives    Pt. Self reported   Other Hives    Pt. Self reported   Trazodone Other (See Comments)    Bed wetting  Other Reaction(s): Other (See Comments)    Bed wetting   "

## 2024-03-08 NOTE — Plan of Care (Signed)
 Patient remains intubated and sedated. Patient was double-stacking their breaths, holding their breath, and wasn't getting any volume. I administered dilaudid  boluses and titrated up on the prop with no relief. Provider was notified and was at bedside. PRN paralyze medication was administered twice over the course of my shift with relief.    Problem: Nutrition: Goal: Adequate nutrition will be maintained Outcome: Progressing   Problem: Coping: Goal: Level of anxiety will decrease Outcome: Progressing   Problem: Elimination: Goal: Will not experience complications related to urinary retention Outcome: Progressing   Problem: Pain Managment: Goal: General experience of comfort will improve and/or be controlled Outcome: Progressing   Problem: Safety: Goal: Ability to remain free from injury will improve Outcome: Progressing   Problem: Education: Goal: Knowledge of General Education information will improve Description: Including pain rating scale, medication(s)/side effects and non-pharmacologic comfort measures Outcome: Not Progressing   Problem: Clinical Measurements: Goal: Respiratory complications will improve Outcome: Not Progressing   Problem: Activity: Goal: Risk for activity intolerance will decrease Outcome: Not Progressing   Problem: Elimination: Goal: Will not experience complications related to bowel motility Outcome: Not Progressing

## 2024-03-08 NOTE — Progress Notes (Addendum)
 PHARMACY CONSULT NOTE - ELECTROLYTES  Pharmacy Consult for Electrolyte Monitoring and Replacement   Recent Labs: Height: 5' 6 (167.6 cm) Weight:  (unable to get weight, bed was zeroed.) IBW/kg (Calculated) : 59.3 Estimated Creatinine Clearance: 55.1 mL/min (A) (by C-G formula based on SCr of 1.15 mg/dL (H)).  Potassium (mmol/L)  Date Value  03/08/2024 5.4 (H)  11/12/2013 4.1   Magnesium  (mg/dL)  Date Value  98/75/7973 3.2 (H)   Calcium  (mg/dL)  Date Value  98/75/7973 9.4   Calcium , Total (mg/dL)  Date Value  90/69/7984 9.0   Albumin (g/dL)  Date Value  98/76/7973 3.5  05/12/2020 4.5  11/12/2013 3.5   Phosphorus (mg/dL)  Date Value  98/75/7973 5.4 (H)   Sodium (mmol/L)  Date Value  03/08/2024 142  12/06/2022 139  11/12/2013 141    Assessment  Erica Gomez is a 59 y.o. female presenting with respiratory failure. PMH significant for COPD, asthma, CAD, seizures, and bipolar disorder. Pt has been hyperkalemic up to 6.1 on 1/17. Pharmacy has been consulted to monitor and replace electrolytes.  Diet: Tube feeds @ 23mL/hr  + free water  30mL Q4H  MIVF: 0.45 % NaCl at 50 mL/hr  Goal of Therapy: Electrolytes WNL  Plan:  --K 5 >> 5.4 despite Lokelma  10 g per tube x 1 dose yesterday. Give Lokelma  10 g per tube x 2 doses and re-check K + this evening --No electrolyte replacement indicated today --Follow-up electrolytes with AM labs tomorrow  Thank you for allowing pharmacy to be a part of this patient's care.  Erica Gomez 03/08/2024 7:09 AM

## 2024-03-09 ENCOUNTER — Inpatient Hospital Stay: Payer: MEDICAID

## 2024-03-09 DIAGNOSIS — I952 Hypotension due to drugs: Secondary | ICD-10-CM

## 2024-03-09 LAB — BLOOD GAS, ARTERIAL
Acid-Base Excess: 1.7 mmol/L (ref 0.0–2.0)
Acid-Base Excess: 1.4 mmol/L (ref 0.0–2.0)
Acid-Base Excess: 2.4 mmol/L — ABNORMAL HIGH (ref 0.0–2.0)
Acid-base deficit: 0.4 mmol/L (ref 0.0–2.0)
Acid-base deficit: 2 mmol/L (ref 0.0–2.0)
Acid-base deficit: 0.2 mmol/L (ref 0.0–2.0)
Acid-base deficit: 0.2 mmol/L (ref 0.0–2.0)
Acid-base deficit: 0.7 mmol/L (ref 0.0–2.0)
Acid-base deficit: 0.3 mmol/L (ref 0.0–2.0)
Bicarbonate: 28.9 mmol/L — ABNORMAL HIGH (ref 20.0–28.0)
Bicarbonate: 29.6 mmol/L — ABNORMAL HIGH (ref 20.0–28.0)
Bicarbonate: 25.9 mmol/L (ref 20.0–28.0)
Bicarbonate: 25.4 mmol/L (ref 20.0–28.0)
Bicarbonate: 27.2 mmol/L (ref 20.0–28.0)
Bicarbonate: 28.5 mmol/L — ABNORMAL HIGH (ref 20.0–28.0)
Bicarbonate: 30.9 mmol/L — ABNORMAL HIGH (ref 20.0–28.0)
Bicarbonate: 28.5 mmol/L — ABNORMAL HIGH (ref 20.0–28.0)
Bicarbonate: 28.8 mmol/L — ABNORMAL HIGH (ref 20.0–28.0)
FIO2: 30 %
FIO2: 40 %
FIO2: 30 %
FIO2: 30 %
FIO2: 30 %
FIO2: 40 %
FIO2: 40 %
FIO2: 40 %
FIO2: 30 %
MECHVT: 420 mL
MECHVT: 420 mL
MECHVT: 420 mL
MECHVT: 420 mL
Mechanical Rate: 18
Mechanical Rate: 24
Mechanical Rate: 24
Mechanical Rate: 24
Mechanical Rate: 24
O2 Saturation: 90.6 %
O2 Saturation: 97.1 %
O2 Saturation: 96.4 %
O2 Saturation: 96.3 %
O2 Saturation: 96.7 %
O2 Saturation: 95.5 %
O2 Saturation: 95.6 %
O2 Saturation: 94 %
O2 Saturation: 94.5 %
PEEP: 5 cmH2O
PEEP: 5 cmH2O
PEEP: 5 cmH2O
PEEP: 5 cmH2O
PEEP: 5 cmH2O
PEEP: 5 cmH2O
PEEP: 5 cmH2O
PEEP: 5 cmH2O
PEEP: 5 cmH2O
Patient temperature: 37
Patient temperature: 37
Patient temperature: 37
Patient temperature: 37
Patient temperature: 37
Patient temperature: 37
Patient temperature: 37
Patient temperature: 37
Patient temperature: 37
Pressure control: 12 cmH2O
Pressure control: 14 cmH2O
Pressure support: 10 cmH2O
Pressure support: 10 cmH2O
Pressure support: 10 cmH2O
pCO2 arterial: 69 mmHg (ref 32–48)
pCO2 arterial: 89 mmHg (ref 32–48)
pCO2 arterial: 47 mmHg (ref 32–48)
pCO2 arterial: 44 mmHg (ref 32–48)
pCO2 arterial: 45 mmHg (ref 32–48)
pCO2 arterial: 68 mmHg (ref 32–48)
pCO2 arterial: 72 mmHg (ref 32–48)
pCO2 arterial: 65 mmHg — ABNORMAL HIGH (ref 32–48)
pCO2 arterial: 51 mmHg — ABNORMAL HIGH (ref 32–48)
pH, Arterial: 7.23 — ABNORMAL LOW (ref 7.35–7.45)
pH, Arterial: 7.13 — CL (ref 7.35–7.45)
pH, Arterial: 7.35 (ref 7.35–7.45)
pH, Arterial: 7.37 (ref 7.35–7.45)
pH, Arterial: 7.39 (ref 7.35–7.45)
pH, Arterial: 7.23 — ABNORMAL LOW (ref 7.35–7.45)
pH, Arterial: 7.24 — ABNORMAL LOW (ref 7.35–7.45)
pH, Arterial: 7.25 — ABNORMAL LOW (ref 7.35–7.45)
pH, Arterial: 7.36 (ref 7.35–7.45)
pO2, Arterial: 66 mmHg — ABNORMAL LOW (ref 83–108)
pO2, Arterial: 97 mmHg (ref 83–108)
pO2, Arterial: 70 mmHg — ABNORMAL LOW (ref 83–108)
pO2, Arterial: 69 mmHg — ABNORMAL LOW (ref 83–108)
pO2, Arterial: 80 mmHg — ABNORMAL LOW (ref 83–108)
pO2, Arterial: 77 mmHg — ABNORMAL LOW (ref 83–108)
pO2, Arterial: 77 mmHg — ABNORMAL LOW (ref 83–108)
pO2, Arterial: 79 mmHg — ABNORMAL LOW (ref 83–108)
pO2, Arterial: 67 mmHg — ABNORMAL LOW (ref 83–108)

## 2024-03-09 LAB — GLUCOSE, CAPILLARY
Glucose-Capillary: 112 mg/dL — ABNORMAL HIGH (ref 70–99)
Glucose-Capillary: 114 mg/dL — ABNORMAL HIGH (ref 70–99)
Glucose-Capillary: 121 mg/dL — ABNORMAL HIGH (ref 70–99)
Glucose-Capillary: 123 mg/dL — ABNORMAL HIGH (ref 70–99)
Glucose-Capillary: 139 mg/dL — ABNORMAL HIGH (ref 70–99)
Glucose-Capillary: 87 mg/dL (ref 70–99)
Glucose-Capillary: 94 mg/dL (ref 70–99)

## 2024-03-09 LAB — BASIC METABOLIC PANEL WITH GFR
Anion gap: 8 (ref 5–15)
BUN: 26 mg/dL — ABNORMAL HIGH (ref 6–20)
CO2: 26 mmol/L (ref 22–32)
Calcium: 9.2 mg/dL (ref 8.9–10.3)
Chloride: 108 mmol/L (ref 98–111)
Creatinine, Ser: 0.79 mg/dL (ref 0.44–1.00)
GFR, Estimated: >60 mL/min
Glucose, Bld: 148 mg/dL — ABNORMAL HIGH (ref 70–99)
Potassium: 5.1 mmol/L (ref 3.5–5.1)
Sodium: 142 mmol/L (ref 135–145)

## 2024-03-09 LAB — CBC
HCT: 30.5 % — ABNORMAL LOW (ref 36.0–46.0)
Hemoglobin: 9.3 g/dL — ABNORMAL LOW (ref 12.0–15.0)
MCH: 29.4 pg (ref 26.0–34.0)
MCHC: 30.5 g/dL (ref 30.0–36.0)
MCV: 96.5 fL (ref 80.0–100.0)
Platelets: 267 10*3/uL (ref 150–400)
RBC: 3.16 MIL/uL — ABNORMAL LOW (ref 3.87–5.11)
RDW: 14 % (ref 11.5–15.5)
WBC: 10.2 10*3/uL (ref 4.0–10.5)
nRBC: 0 % (ref 0.0–0.2)

## 2024-03-09 LAB — PHOSPHORUS: Phosphorus: 2.6 mg/dL (ref 2.5–4.6)

## 2024-03-09 LAB — CK: Total CK: 93 U/L (ref 38–234)

## 2024-03-09 LAB — TRIGLYCERIDES: Triglycerides: 173 mg/dL — ABNORMAL HIGH

## 2024-03-09 LAB — MAGNESIUM: Magnesium: 3 mg/dL — ABNORMAL HIGH (ref 1.7–2.4)

## 2024-03-09 MED ORDER — SODIUM CHLORIDE 0.9 % IV SOLN
2.0000 g | Freq: Three times a day (TID) | INTRAVENOUS | Status: AC
  Administered 2024-03-09 – 2024-03-11 (×7): 2 g via INTRAVENOUS
  Filled 2024-03-09 (×7): qty 12.5

## 2024-03-09 NOTE — Progress Notes (Signed)
 "  NAME:  Erica Gomez, MRN:  [id number], DOB:  [birth year], LOS: 9 ADMISSION DATE:  02/29/2024, CHIEF COMPLAINT:  Respiratory Failure   History of Present Illness:  59 y/o F with PMH sig for asthma, COPD, tobacco use disorder, bipolar disorder, seizure, polysubstance abuse, anxiety/depression, and HLD presented to ED BIB [facility] from home for acute respiratory distress.   On Chart review, patient follows with pulmonologist; last visit with Dr. Isadora on 10/25. Meds prescribed at visit included fluticasone  furoate (Arnuity Ellipta ) 200 mcg 1 puff daily and umeclidinium-vilanterol (Anoro Ellipta ) 62.5/25 mcg 1 puff daily. Per husband, patient has been taking medications as prescribed. EMS tx included MgSO? 2 g IV, Solu-Medrol  125 mg IV, DuoNeb x1, and albuterol  x1. During transport, pt became combative/confused and received Haldol 4 mg IM and Versed  5 mg IM. Initially A&O with EMS. SpO? 94% on RA.   ED Course: Initial VS: BP 211/120, HR 142, SpO? 94% on RA. Patient was unresponsive with labored/heaving respirations and in severe resp distress. Concern for hypercapnic resp failure contributing to agitation and AMS. Pt subsequently lost ability to protect airway. Given worsening MS and resp failure, decision made for emergent intubation. Pt received ketamine  and rocuronium  and was intubated successfully without complication.  Significant Hospital Events: Including procedures, antibiotic start and stop dates in addition to other pertinent events   1/16: Admitted to ICU for asthma/COPD overlap exacerbation with hypercapnic respiratory failure requiring emergent intubation. 03/01/24- patient failed SBT today, remains on mV. She has AKI.   Strep pneumoniae +, will continue Rocephin .  Husband at bedside I met with him and we reviewed medical plan.  03/02/24- patient had resp distress overnight.  She still has AKI but there is an interval improvement.  03/03/24- patient for SBT today.  AKI has improved this am.   She requires heavy sedation. She has family coming in this afternoon who wishes to be present for SBT. 03/04/24- patient passed SBT and was extubated but after 1 hour started to have respiratory distress with severe hypoxemia. Her daughter was present and requested re-intubation. We continue to treat her underlying Asthma COPD exacerbation with strep pneumoniae respiratory infection.  03/05/24- Patient failed SBT today with tachypnea/tachycardia/resp distress 03/06/24- Patient failed SBT and husband was present during evaluation, she was tachypneic and tachycardic. 03/07/24- Asynchronous with vent, with obstructive process/bronchospasm.  Steroids increased to 80 mg BID, Ketamine  gtt started, place A-line for frequent ABG's. 03/08/24 - paralyzed with worsening hypercapnia. Not a candidate for ECMO. Ketamine  increased to 3 mg/hour.  Interim History / Subjective:  Remains sedated and ventilated, minimally responsive.  Objective    Blood pressure 116/72, pulse 82, temperature (!) 97.5 F (36.4 C), temperature source Rectal, resp. rate (!) 24, height 5' 6 (1.676 m), weight 74.5 kg, SpO2 95%.    Vent Mode: PRVC FiO2 (%):  [30 %-99 %] 30 % Set Rate:  [24 bmp] 24 bmp Vt Set:  [420 mL] 420 mL PEEP:  [5 cmH20] 5 cmH20 Plateau Pressure:  [20 cmH20-21 cmH20] 20 cmH20   Intake/Output Summary (Last 24 hours) at 03/09/2024 9166 Last data filed at 03/09/2024 0600 Gross per 24 hour  Intake 4058.47 ml  Output 2600 ml  Net 1458.47 ml   Filed Weights   03/06/24 0451 03/09/24 0407  Weight: 74.5 kg 74.5 kg    Examination: Physical Exam Constitutional:      General: She is not in acute distress.    Appearance: She is ill-appearing.  Cardiovascular:     Rate  and Rhythm: Regular rhythm. Tachycardia present.     Pulses: Normal pulses.     Heart sounds: Normal heart sounds.  Pulmonary:     Breath sounds: Wheezing present.     Comments: Diffuse expiratory wheezing Neurological:     Mental Status: She is  disoriented.      Assessment and Plan   Patient is a 59 year old female with a history of severe persistent asthma and active tobacco use maintained on triple inhaler therapy who presented to the hospital with acute hypoxic respiratory failure. She was treated for S. Pneumonia, and intubated for respiratory failure secondary to status asthmaticus.   #Acute Hypoxic and Hypercapnic Respiratory Failure #Status Asthmaticus #S. Pneumo Pneumonia #Toxic Metabolic Encephalopathy  Neurology - Sedated with ketamine  (given status asthmaticus) and propofol  infusions, with hydromorphone  initiated for analgesia. Patient also paralyzed yesterday to help with vent compliance as she was quite dyssynchronous. Today, we have switched off her paralysis and weaned down her hydromorphone  and ketamine , with plan to maintain on propofol  and as needed hydromorphone  for analgesia.  -daily WUA as her respiratory status improves  Cardiovascular - Nor-epi for goal MAP > 65 mmHg in the setting of sedation related hypotension. Monitoring for intrinsic PEEP as could decrease RV preload.  Pulmonary - She continues to have a prolonged expiratory phase, with significant ventilator dyssynchrony likely owing to poorly controlled asthma. I suspect this is because of her air trapping and intrinsic PEEP and need for a significantly prolonged exhalation.  We had to paralyze her with her worsening hypercapnic respiratory failure and status asthmaticus. She remains on high dose steroids (increased risk of critical illness myopathy with paralysis) and nebulizer therapy. Discussed with ECM[facility] at [facility], patient not a candidate for ECMO at this point as has been intubated for 8 days, with low likelihood of successful wean.   Will continue with standing nebs, and attempted to adjust her ventilator settings. She seems to tolerate PSV setting her own I/E ratios, though this has resulted in build up of her CO2. I think that permissive  hypercapnia, so long as her pH does not drop below 7.2, is safer than full paralysis to normalize the pH (given high risk for critical illness myopathy with combination of steroids and neuromuscular blockade). Ultimately, if the patient doesn't improve clinically, she might require a tracheostomy tube.  -ABG every 4 hours  -methylpred 80 bid  -d/c paralysis  Gastrointestinal - continue tube feeds, PPI for SUP  Renal - kidney function is improved further. Holding on IV fluids today to avoid any volume overload. Check CK's given paralysis.  Endocrine - ICU glycemic protocol  Hem/Onc - DVT prophylaxis with SubQ heparin   ID - continue Cefepime  pending respiratory cultures from 1/21 for treatment of VAP. Planned course of 7 days.   Labs   CBC: Recent Labs  Lab 03/05/24 0420 03/06/24 0225 03/07/24 0413 03/08/24 0504 03/09/24 0321  WBC 10.3 10.5 12.1* 13.9* 10.2  HGB 10.5* 10.1* 10.9* 10.8* 9.3*  HCT 33.9* 32.1* 36.6 36.0 30.5*  MCV 93.9 95.3 98.1 99.4 96.5  PLT 283 264 272 302 267    Basic Metabolic Panel: Recent Labs  Lab 03/05/24 0420 03/06/24 0225 03/07/24 0413 03/08/24 0504 03/08/24 1716 03/09/24 0321  NA 141 135 141 142  --  142  K 4.7 4.6 5.0 5.4* 5.0 5.1  CL 106 102 104 106  --  108  CO2 28 25 29 28   --  26  GLUCOSE 178* 120* 131* 170*  --  148*  BUN 26* 25* 28* 35*  --  26*  CREATININE 0.86 0.72 0.87 1.15*  --  0.79  CALCIUM  9.3 8.3* 9.9 9.4  --  9.2  MG 2.3 2.2 2.2 3.2*  --  3.0*  PHOS 3.0 3.4 3.5 5.4*  --  2.6   GFR: Estimated Creatinine Clearance: 79.1 mL/min (by C-G formula based on SCr of 0.79 mg/dL). Recent Labs  Lab 03/05/24 0420 03/06/24 0225 03/07/24 0413 03/08/24 0504 03/09/24 0321  PROCALCITON <0.10 <0.10  --   --   --   WBC 10.3 10.5 12.1* 13.9* 10.2    Liver Function Tests: Recent Labs  Lab 03/07/24 0410  AST 26  ALT 28  ALKPHOS 57  BILITOT <0.2  PROT 6.2*  ALBUMIN 3.5   No results for input(s): LIPASE, AMYLASE in the last  168 hours. No results for input(s): AMMONIA in the last 168 hours.  ABG    Component Value Date/Time   PHART 7.37 03/09/2024 0555   PCO2ART 44 03/09/2024 0555   PO2ART 69 (L) 03/09/2024 0555   HCO3 25.4 03/09/2024 0555   ACIDBASEDEF 0.2 03/09/2024 0555   O2SAT 96.3 03/09/2024 0555     Coagulation Profile: No results for input(s): INR, PROTIME in the last 168 hours.  Cardiac Enzymes: No results for input(s): CKTOTAL, CKMB, CKMBINDEX, TROPONINI in the last 168 hours.  HbA1C: Hemoglobin A1C  Date/Time Value Ref Range Status  09/21/2021 02:00 PM 6.4 (A) 4.0 - 5.6 % Final  03/22/2021 09:52 AM 6.2 (A) 4.0 - 5.6 % Final  01/15/2014 12:00 AM 6.8  Final   HbA1c, POC (prediabetic range)  Date/Time Value Ref Range Status  05/11/2020 10:30 AM 0 (A) 5.7 - 6.4 % Final   HbA1c, POC (controlled diabetic range)  Date/Time Value Ref Range Status  05/11/2020 10:30 AM 0.0 0.0 - 7.0 % Final   HbA1c POC (<> result, manual entry)  Date/Time Value Ref Range Status  05/11/2020 10:30 AM 6.5 4.0 - 5.6 % Final   Hgb A1c MFr Bld  Date/Time Value Ref Range Status  03/01/2024 02:02 AM 6.4 (H) 4.8 - 5.6 % Final    Comment:    (NOTE) Diagnosis of Diabetes The following HbA1c ranges recommended by the American Diabetes Association (ADA) may be used as an aid in the diagnosis of diabetes mellitus.  Hemoglobin             Suggested A1C NGSP%              Diagnosis  <5.7                   Non Diabetic  5.7-6.4                Pre-Diabetic  >6.4                   Diabetic  <7.0                   Glycemic control for                       adults with diabetes.    12/24/2019 11:08 AM 6.7 (H) 4.8 - 5.6 % Final    Comment:             Prediabetes: 5.7 - 6.4          Diabetes: >6.4          Glycemic control for adults with diabetes: <7.0  CBG: Recent Labs  Lab 03/08/24 1529 03/08/24 2010 03/08/24 2356 03/09/24 0323 03/09/24 0800  GLUCAP 128* 119* 121* 139* 114*     Review of Systems:   N/A  Past Medical History:  She,  has a past medical history of Allergy, Anxiety, Asthma, Bilateral carpal tunnel syndrome (12/31/2018), Bipolar affective, manic (HCC) (1996), COPD (chronic obstructive pulmonary disease) (HCC), Depression, HLD (hyperlipidemia) (03/12/2018), Lung nodule, multiple (09/26/2016), Manic depressive illness (HCC) (1996), Seizures (HCC), Substance abuse (HCC), and Tobacco abuse (10/04/2016).   Surgical History:   Past Surgical History:  Procedure Laterality Date   ABDOMINAL HYSTERECTOMY     CARPAL TUNNEL RELEASE Right 01/15/2019   Procedure: RIGHTCARPAL TUNNEL RELEASE, LEFT DEQUERVAINS INJECTION;  Surgeon: Jerri Kay HERO, MD;  Location: [facility];  Service: Orthopedics;  Laterality: Right;   CARPAL TUNNEL RELEASE Left 02/26/2019   Procedure: LEFT CARPAL TUNNEL RELEASE;  Surgeon: Jerri Kay HERO, MD;  Location:  SURGERY CENTER;  Service: Orthopedics;  Laterality: Left;  Bier block   COLONOSCOPY WITH PROPOFOL  N/A 11/28/2018   Procedure: COLONOSCOPY WITH PROPOFOL ;  Surgeon: Therisa Bi, MD;  Location: [facility] END[facility]COPY;  Service: Gastroenterology;  Laterality: N/A;   TUBAL LIGATION       Social History:   reports that she has been smoking cigarettes. Her smokeless tobacco use includes chew and snuff. She reports that she does not currently use alcohol. She reports that she does not use drugs.   Family History:  Her family history includes Bipolar disorder in her brother and brother; CAD in her father; Heart attack in her father; Other in her mother; Schizophrenia in her brother and brother; Stroke in her mother.   Allergies Allergies[1]   Home Medications  Prior to Admission medications  Medication Sig Start Date End Date Taking? Authorizing Provider  albuterol  (PROVENTIL  HFA) 108 (90 Base) MCG/ACT inhaler Inhale 2 puffs into the lungs every 4-6 hours as needed. 09/21/21  Yes Iloabachie, Chioma E, NP   amphetamine-dextroamphetamine (ADDERALL XR) 10 MG 24 hr capsule Take 10 mg by mouth daily.   Yes [provider]  citalopram  (CELEXA ) 40 MG tablet Take 1 tablet (40 mg total) by mouth daily. 11/21/22  Yes   Fluticasone  Furoate (ARNUITY ELLIPTA ) 200 MCG/ACT AEPB Inhale 1 puff into the lungs daily. 12/06/23  Yes Eaton Folmar, Belva, MD  hydrOXYzine  (ATARAX ) 50 MG tablet Take 1-1.5 tablets (50-75 mg total) by mouth at bedtime as needed for sleep 11/21/22  Yes   lamoTRIgine  (LAMICTAL ) 100 MG tablet Take 1 tablet (100 mg total) by mouth daily. 11/21/22  Yes   rosuvastatin  (CRESTOR ) 10 MG tablet Take 10 mg by mouth at bedtime. 08/13/23  Yes [provider]  traMADol (ULTRAM) 50 MG tablet Take 1 tablet by mouth every 6 (six) hours as needed.   Yes [provider]  umeclidinium-vilanterol (ANORO ELLIPTA ) 62.5-25 MCG/ACT AEPB Inhale 1 puff into the lungs daily. 12/06/23  Yes Xzavian Semmel, Belva, MD  ipratropium-albuterol  (DUONEB) 0.5-2.5 (3) MG/3ML SOLN INHALE CONTENTS OF ONE VIAL ( TOTAL) USING NEBULIZER  ONCE EVERY 6 HOURS. 11/02/20 12/06/23  Iloabachie, Chioma E, NP  atomoxetine  (STRATTERA ) 25 MG capsule Take 1 capsule (25 mg total) by mouth once every evening after dinner. Patient not taking: Reported on 12/23/2020 10/26/20 03/09/21    loratadine  (CLARITIN ) 10 MG tablet Take 1 tablet (10 mg total) by mouth daily as needed for allergies. 12/16/19 12/25/19  Iloabachie, Chioma E, NP     The patient is critically ill due to status asthmaticus,  acute hypoxic and hypercapnic respiratory failure, toxic metabolic encephalopathy.  Critical care was necessary to treat or prevent imminent or life-threatening deterioration. I personally performed high risk medication and infusion titration and management, titration, monitoring, and management of vasopressor/ionotrope infusion, mechanical ventilation management and titration, and blood gas interpretation. Critical care time was spent by me on the following  activities: development of a treatment plan with the patient and/or surrogate as well as nursing, discussions with consultants, evaluation of the patient's response to treatment, examination of the patient, obtaining a history from the patient or surrogate, ordering and performing treatments and interventions, ordering and review of laboratory studies, ordering and review of radiographic studies, review of telemetry data including pulse oximetry, re-evaluation of patient's condition and participation in multidisciplinary rounds.   I personally spent 90 minutes providing critical care not including any separately billable procedures.   Belva November, MD [facility] 03/09/2024 4:19 PM         [1]  Allergies Allergen Reactions   Flax Seed Oil [Flax Seed Oil] Hives    Pt. Self reported   Other Hives    Pt. Self reported   Trazodone Other (See Comments)    Bed wetting  Other Reaction(s): Other (See Comments)    Bed wetting   "

## 2024-03-09 NOTE — Progress Notes (Signed)
 PHARMACY CONSULT NOTE - ELECTROLYTES  Pharmacy Consult for Electrolyte Monitoring and Replacement   Recent Labs: Height: 5' 6 (167.6 cm) Weight: 74.5 kg (164 lb 3.9 oz) (adm weight) IBW/kg (Calculated) : 59.3 Estimated Creatinine Clearance: 79.1 mL/min (by C-G formula based on SCr of 0.79 mg/dL).  Potassium (mmol/L)  Date Value  03/09/2024 5.1  11/12/2013 4.1   Magnesium  (mg/dL)  Date Value  98/74/7973 3.0 (H)   Calcium  (mg/dL)  Date Value  98/74/7973 9.2   Calcium , Total (mg/dL)  Date Value  90/69/7984 9.0   Albumin (g/dL)  Date Value  98/76/7973 3.5  05/12/2020 4.5  11/12/2013 3.5   Phosphorus (mg/dL)  Date Value  98/74/7973 2.6   Sodium (mmol/L)  Date Value  03/09/2024 142  12/06/2022 139  11/12/2013 141   Assessment  Erica Gomez is a 59 y.o. female presenting with respiratory failure. PMH significant for COPD, asthma, CAD, seizures, and bipolar disorder. Pt has been hyperkalemic up to 6.1 on 1/17. Pharmacy has been consulted to monitor and replace electrolytes.  Diet: Tube feeds at goal  + free water  30mL Q4H  MIVF: 0.45 % NaCl at 75 mL/hr  Goal of Therapy: Electrolytes WNL  Plan:  --No electrolyte replacement indicated today --Follow-up electrolytes with AM labs tomorrow  Thank you for allowing pharmacy to be a part of this patient's care.  [name] 03/09/2024 6:34 AM

## 2024-03-09 NOTE — Progress Notes (Signed)
 Notified Dr. Isadora that ketamine  was titrated off and that within 10 minutes patient became dyssynchronous with ventilator and blood pressure elevated 180s/90s, desating in low 80s. MD coming to bedside.

## 2024-03-09 NOTE — Plan of Care (Signed)
 Patient remains intubated and sedated. Nimbex  drip infusing, RASS -5, on continuous sedation. Hemodynamically stable, through out shift, VS within ordered parameters. Respiratory status stable, pt tolerating current vent settings.  No acute events this shift.    Problem: Education: Goal: Knowledge of General Education information will improve Description: Including pain rating scale, medication(s)/side effects and non-pharmacologic comfort measures Outcome: Progressing   Problem: Health Behavior/Discharge Planning: Goal: Ability to manage health-related needs will improve Outcome: Progressing   Problem: Clinical Measurements: Goal: Ability to maintain clinical measurements within normal limits will improve Outcome: Progressing Goal: Will remain free from infection Outcome: Progressing Goal: Diagnostic test results will improve Outcome: Progressing Goal: Respiratory complications will improve Outcome: Not Progressing Goal: Cardiovascular complication will be avoided Outcome: Progressing   Problem: Education: Goal: Ability to describe self-care measures that may prevent or decrease complications (Diabetes Survival Skills Education) will improve Outcome: Progressing Goal: Individualized Educational Video(s) Outcome: Progressing   Problem: Skin Integrity: Goal: Risk for impaired skin integrity will decrease Outcome: Progressing

## 2024-03-09 NOTE — Progress Notes (Signed)
 Sent secure chat to Dr. Isadora to make him aware of ultrasound results. Pepper, NP at bedside and aware of US  results.

## 2024-03-09 NOTE — Progress Notes (Signed)
 MD at bedside and gave order to stop nimbex  drip.

## 2024-03-09 NOTE — Plan of Care (Signed)
" °  Problem: Clinical Measurements: Goal: Cardiovascular complication will be avoided Outcome: Progressing   Problem: Elimination: Goal: Will not experience complications related to urinary retention Outcome: Progressing   Problem: Safety: Goal: Ability to remain free from injury will improve Outcome: Progressing   Problem: Skin Integrity: Goal: Risk for impaired skin integrity will decrease Outcome: Progressing   Problem: Metabolic: Goal: Ability to maintain appropriate glucose levels will improve Outcome: Progressing   Problem: Skin Integrity: Goal: Risk for impaired skin integrity will decrease Outcome: Progressing   Problem: Tissue Perfusion: Goal: Adequacy of tissue perfusion will improve Outcome: Progressing   "

## 2024-03-09 NOTE — Progress Notes (Signed)
 Dr. Isadora gave order to titrate down Ketamine  by 1 mg /kg/H to off. MD also gave order to stop 0.45% saline infusion.

## 2024-03-10 ENCOUNTER — Other Ambulatory Visit: Payer: Self-pay

## 2024-03-10 ENCOUNTER — Inpatient Hospital Stay: Payer: MEDICAID

## 2024-03-10 LAB — CBC WITH DIFFERENTIAL/PLATELET
Abs Immature Granulocytes: 0.22 10*3/uL — ABNORMAL HIGH (ref 0.00–0.07)
Basophils Absolute: 0 10*3/uL (ref 0.0–0.1)
Basophils Relative: 0 %
Eosinophils Absolute: 0.2 10*3/uL (ref 0.0–0.5)
Eosinophils Relative: 1 %
HCT: 33 % — ABNORMAL LOW (ref 36.0–46.0)
Hemoglobin: 10.4 g/dL — ABNORMAL LOW (ref 12.0–15.0)
Immature Granulocytes: 1 %
Lymphocytes Relative: 5 %
Lymphs Abs: 0.9 10*3/uL (ref 0.7–4.0)
MCH: 29.2 pg (ref 26.0–34.0)
MCHC: 31.5 g/dL (ref 30.0–36.0)
MCV: 92.7 fL (ref 80.0–100.0)
Monocytes Absolute: 0.9 10*3/uL (ref 0.1–1.0)
Monocytes Relative: 5 %
Neutro Abs: 14.7 10*3/uL — ABNORMAL HIGH (ref 1.7–7.7)
Neutrophils Relative %: 88 %
Platelets: 330 10*3/uL (ref 150–400)
RBC: 3.56 MIL/uL — ABNORMAL LOW (ref 3.87–5.11)
RDW: 14.2 % (ref 11.5–15.5)
WBC: 16.8 10*3/uL — ABNORMAL HIGH (ref 4.0–10.5)
nRBC: 0.1 % (ref 0.0–0.2)

## 2024-03-10 LAB — BLOOD GAS, ARTERIAL
Acid-Base Excess: 3.4 mmol/L — ABNORMAL HIGH (ref 0.0–2.0)
Acid-Base Excess: 2 mmol/L (ref 0.0–2.0)
Acid-Base Excess: 1.3 mmol/L (ref 0.0–2.0)
Acid-Base Excess: 2.4 mmol/L — ABNORMAL HIGH (ref 0.0–2.0)
Acid-Base Excess: 2.8 mmol/L — ABNORMAL HIGH (ref 0.0–2.0)
Acid-Base Excess: 5.8 mmol/L — ABNORMAL HIGH (ref 0.0–2.0)
Bicarbonate: 29.6 mmol/L — ABNORMAL HIGH (ref 20.0–28.0)
Bicarbonate: 27.8 mmol/L (ref 20.0–28.0)
Bicarbonate: 27.2 mmol/L (ref 20.0–28.0)
Bicarbonate: 28.3 mmol/L — ABNORMAL HIGH (ref 20.0–28.0)
Bicarbonate: 28.5 mmol/L — ABNORMAL HIGH (ref 20.0–28.0)
Bicarbonate: 31.7 mmol/L — ABNORMAL HIGH (ref 20.0–28.0)
FIO2: 50 %
FIO2: 30 %
FIO2: 0.3 %
FIO2: 40 %
FIO2: 40 %
FIO2: 40 %
MECHVT: 420 mL
MECHVT: 420 mL
MECHVT: 420 mL
MECHVT: 420 mL
MECHVT: 420 mL
MECHVT: 420 mL
Mechanical Rate: 24
Mechanical Rate: 24
Mechanical Rate: 24
Mechanical Rate: 24
Mechanical Rate: 24
O2 Saturation: 99.3 %
O2 Saturation: 95.3 %
O2 Saturation: 94.7 %
O2 Saturation: 94 %
O2 Saturation: 97.1 %
O2 Saturation: 97 %
PEEP: 8 cmH2O
PEEP: 5 cmH2O
PEEP: 5 cmH2O
PEEP: 5 cmH2O
PEEP: 5 cmH2O
PEEP: 5 cmH2O
Patient temperature: 37
Patient temperature: 37
Patient temperature: 37
Patient temperature: 37
Patient temperature: 37
Patient temperature: 37
RATE: 24 {breaths}/min
pCO2 arterial: 50 mmHg — ABNORMAL HIGH (ref 32–48)
pCO2 arterial: 47 mmHg (ref 32–48)
pCO2 arterial: 47 mmHg (ref 32–48)
pCO2 arterial: 49 mmHg — ABNORMAL HIGH (ref 32–48)
pCO2 arterial: 47 mmHg (ref 32–48)
pCO2 arterial: 50 mmHg — ABNORMAL HIGH (ref 32–48)
pH, Arterial: 7.38 (ref 7.35–7.45)
pH, Arterial: 7.38 (ref 7.35–7.45)
pH, Arterial: 7.37 (ref 7.35–7.45)
pH, Arterial: 7.37 (ref 7.35–7.45)
pH, Arterial: 7.39 (ref 7.35–7.45)
pH, Arterial: 7.41 (ref 7.35–7.45)
pO2, Arterial: 139 mmHg — ABNORMAL HIGH (ref 83–108)
pO2, Arterial: 69 mmHg — ABNORMAL LOW (ref 83–108)
pO2, Arterial: 68 mmHg — ABNORMAL LOW (ref 83–108)
pO2, Arterial: 65 mmHg — ABNORMAL LOW (ref 83–108)
pO2, Arterial: 78 mmHg — ABNORMAL LOW (ref 83–108)
pO2, Arterial: 85 mmHg (ref 83–108)

## 2024-03-10 LAB — BASIC METABOLIC PANEL WITH GFR
Anion gap: 7 (ref 5–15)
BUN: 30 mg/dL — ABNORMAL HIGH (ref 6–20)
CO2: 27 mmol/L (ref 22–32)
Calcium: 8.9 mg/dL (ref 8.9–10.3)
Chloride: 110 mmol/L (ref 98–111)
Creatinine, Ser: 0.76 mg/dL (ref 0.44–1.00)
GFR, Estimated: >60 mL/min
Glucose, Bld: 116 mg/dL — ABNORMAL HIGH (ref 70–99)
Potassium: 4.7 mmol/L (ref 3.5–5.1)
Sodium: 144 mmol/L (ref 135–145)

## 2024-03-10 LAB — MAGNESIUM: Magnesium: 2.4 mg/dL (ref 1.7–2.4)

## 2024-03-10 LAB — GLUCOSE, CAPILLARY
Glucose-Capillary: 115 mg/dL — ABNORMAL HIGH (ref 70–99)
Glucose-Capillary: 106 mg/dL — ABNORMAL HIGH (ref 70–99)
Glucose-Capillary: 90 mg/dL (ref 70–99)
Glucose-Capillary: 113 mg/dL — ABNORMAL HIGH (ref 70–99)
Glucose-Capillary: 111 mg/dL — ABNORMAL HIGH (ref 70–99)

## 2024-03-10 LAB — PHOSPHORUS: Phosphorus: 2.3 mg/dL — ABNORMAL LOW (ref 2.5–4.6)

## 2024-03-10 MED ORDER — IOHEXOL 350 MG/ML SOLN
75.0000 mL | Freq: Once | INTRAVENOUS | Status: AC | PRN
  Administered 2024-03-10: 75 mL via INTRAVENOUS

## 2024-03-10 MED ORDER — FENTANYL CITRATE (PF) 100 MCG/2ML IJ SOLN: 100.0000 ug | Freq: Once | INTRAMUSCULAR | Status: AC

## 2024-03-10 MED ORDER — LACTATED RINGERS IV SOLN: INTRAVENOUS | Status: DC

## 2024-03-10 MED ORDER — SODIUM CHLORIDE 0.9 % IV SOLN: INTRAVENOUS | Status: AC | PRN

## 2024-03-10 MED ORDER — VITAL HP 1.0 CAL PO LIQD
1000.0000 mL | ORAL | Status: DC
  Administered 2024-03-10 – 2024-03-11 (×3): 1000 mL

## 2024-03-10 MED ORDER — ADULT MULTIVITAMIN W/MINERALS CH
1.0000 | ORAL_TABLET | Freq: Every day | ORAL | Status: AC
  Administered 2024-03-11 – 2024-03-21 (×9): 1
  Filled 2024-03-10 (×6): qty 1

## 2024-03-10 MED ORDER — INFLUENZA VIRUS VACC SPLIT PF (FLUZONE) 0.5 ML IM SUSY: 0.5000 mL | PREFILLED_SYRINGE | INTRAMUSCULAR | Status: DC | PRN

## 2024-03-10 MED ORDER — VECURONIUM BROMIDE 10 MG IV SOLR
INTRAVENOUS | Status: AC
  Administered 2024-03-10: 10 mg via INTRAVENOUS
  Filled 2024-03-10: qty 10

## 2024-03-10 MED ORDER — VECURONIUM BROMIDE 10 MG IV SOLR
10.0000 mg | Freq: Once | INTRAVENOUS | Status: AC
  Administered 2024-03-10: 10 mg via INTRAVENOUS

## 2024-03-10 MED ORDER — VECURONIUM BROMIDE 10 MG IV SOLR
INTRAVENOUS | Status: AC
  Filled 2024-03-10: qty 10

## 2024-03-10 MED ORDER — FENTANYL CITRATE (PF) 100 MCG/2ML IJ SOLN
INTRAMUSCULAR | Status: AC
  Administered 2024-03-10: 100 ug via INTRAVENOUS
  Filled 2024-03-10: qty 2

## 2024-03-10 MED ORDER — PROSOURCE TF20 ENFIT COMPATIBL EN LIQD
60.0000 mL | Freq: Four times a day (QID) | ENTERAL | Status: DC
  Administered 2024-03-10 – 2024-03-13 (×6): 60 mL
  Filled 2024-03-10 (×13): qty 60

## 2024-03-10 MED ORDER — VECURONIUM BROMIDE 10 MG IV SOLR
10.0000 mg | Freq: Once | INTRAVENOUS | Status: AC
  Administered 2024-03-10: 10 mg via INTRAVENOUS
  Filled 2024-03-10: qty 10

## 2024-03-10 MED ORDER — THIAMINE HCL 100 MG PO TABS
100.0000 mg | ORAL_TABLET | Freq: Every day | ORAL | Status: DC
  Administered 2024-03-11 – 2024-03-17 (×6): 100 mg
  Filled 2024-03-10 (×12): qty 1

## 2024-03-10 MED ORDER — VECURONIUM BROMIDE 10 MG IV SOLR: 10.0000 mg | Freq: Once | INTRAVENOUS | Status: AC

## 2024-03-10 NOTE — Plan of Care (Incomplete)
 This patient remains on [facility]ICU as of time of writing. The patient is orally intubated and mechanically ventilated on PRVC. The patient is receiving active infusions of Propofol  and Dilaudid  via PIVs. The patient continues to have in place an ETT, Foley catheter, Arterial line, PIVs, OGT, and rectal temperature probe. The patient is receiving enteral nutrition via OGT. Overnight, the patient exhibited a poor ventilatory pattern and required intermittent chemical paralyzation for ventilator compliance. The patient tolerated turns very poorly, exhibiting bradycardia into the 40 bpm range during turns. The patient's admission profile was completed overnight by this RN with assistance from the patient's spouse, Norleen. Unable to obtain the patient's AM weight due to a bed surface malfunction; patient is not stable for a transfer to another bed overnight.    Problem: Education: Goal: Knowledge of General Education information will improve Description: Including pain rating scale, medication(s)/side effects and non-pharmacologic comfort measures Outcome: Progressing   Problem: Health Behavior/Discharge Planning: Goal: Ability to manage health-related needs will improve Outcome: Progressing   Problem: Clinical Measurements: Goal: Ability to maintain clinical measurements within normal limits will improve Outcome: Progressing Goal: Will remain free from infection Outcome: Progressing Goal: Diagnostic test results will improve Outcome: Progressing Goal: Respiratory complications will improve Outcome: Progressing Goal: Cardiovascular complication will be avoided Outcome: Progressing   Problem: Activity: Goal: Risk for activity intolerance will decrease Outcome: Progressing   Problem: Nutrition: Goal: Adequate nutrition will be maintained Outcome: Progressing   Problem: Coping: Goal: Level of anxiety will decrease Outcome: Progressing   Problem: Elimination: Goal: Will not experience  complications related to bowel motility Outcome: Progressing Goal: Will not experience complications related to urinary retention Outcome: Progressing   Problem: Pain Managment: Goal: General experience of comfort will improve and/or be controlled Outcome: Progressing   Problem: Safety: Goal: Ability to remain free from injury will improve Outcome: Progressing   Problem: Skin Integrity: Goal: Risk for impaired skin integrity will decrease Outcome: Progressing   Problem: Education: Goal: Ability to describe self-care measures that may prevent or decrease complications (Diabetes Survival Skills Education) will improve Outcome: Progressing Goal: Individualized Educational Video(s) Outcome: Progressing   Problem: Coping: Goal: Ability to adjust to condition or change in health will improve Outcome: Progressing   Problem: Fluid Volume: Goal: Ability to maintain a balanced intake and output will improve Outcome: Progressing   Problem: Health Behavior/Discharge Planning: Goal: Ability to identify and utilize available resources and services will improve Outcome: Progressing Goal: Ability to manage health-related needs will improve Outcome: Progressing   Problem: Metabolic: Goal: Ability to maintain appropriate glucose levels will improve Outcome: Progressing   Problem: Nutritional: Goal: Maintenance of adequate nutrition will improve Outcome: Progressing Goal: Progress toward achieving an optimal weight will improve Outcome: Progressing   Problem: Skin Integrity: Goal: Risk for impaired skin integrity will decrease Outcome: Progressing   Problem: Tissue Perfusion: Goal: Adequacy of tissue perfusion will improve Outcome: Progressing   Problem: Activity: Goal: Ability to tolerate increased activity will improve Outcome: Progressing   Problem: Respiratory: Goal: Ability to maintain a clear airway and adequate ventilation will improve Outcome: Progressing    Problem: Role Relationship: Goal: Method of communication will improve Outcome: Progressing   Problem: Activity: Goal: Ability to tolerate increased activity will improve Outcome: Progressing   Problem: Clinical Measurements: Goal: Ability to maintain a body temperature in the normal range will improve Outcome: Progressing   Problem: Respiratory: Goal: Ability to maintain adequate ventilation will improve Outcome: Progressing Goal: Ability to maintain a clear airway will improve  Outcome: Progressing

## 2024-03-10 NOTE — Progress Notes (Signed)
 "  NAME:  Erica Gomez, MRN:  [id number], DOB:  [birth year], LOS: 10 ADMISSION DATE:  02/29/2024, CONSULTATION DATE:  02/29/24 REFERRING MD:  Dr. Willo, CHIEF COMPLAINT:  Acute Respiratory Distress    Brief Pt Description / Synopsis:  59 y.o. female admitted with Acute Hypoxic and Hypercapnic Respiratory Failure due to Strep Pneumoniae Pneumonia, severe COPD/Asthma Exacerbation requiring intubation and mechanical ventilation.   History of Present Illness:  59 y/o F with PMH sig for asthma, COPD, tobacco use disorder, bipolar disorder, seizure, polysubstance abuse, anxiety/depression, and HLD presented to ED BIB [facility] from home for acute respiratory distress.   On Chart review, patient follows with pulmonologist; last visit with Dr. Isadora on 10/25. Meds prescribed at visit included fluticasone  furoate (Arnuity Ellipta ) 200 mcg 1 puff daily and umeclidinium-vilanterol (Anoro Ellipta ) 62.5/25 mcg 1 puff daily. Per husband, patient has been taking medications as prescribed. EMS tx included MgSO? 2 g IV, Solu-Medrol  125 mg IV, DuoNeb x1, and albuterol  x1. During transport, pt became combative/confused and received Haldol 4 mg IM and Versed  5 mg IM. Initially A&O with EMS. SpO? 94% on RA.   ED Course: Initial VS: BP 211/120, HR 142, SpO? 94% on RA. Patient was unresponsive with labored/heaving respirations and in severe resp distress. Concern for hypercapnic resp failure contributing to agitation and AMS. Pt subsequently lost ability to protect airway. Given worsening MS and resp failure, decision made for emergent intubation. Pt received ketamine  and rocuronium  and was intubated successfully without complication.   Labs/Imaging: Labs: WBC 10.7, Hgb 12.7, Hct 40.9, Plt 313. BMP: Na 141, K 4.9, Cl 106, CO? 25, BUN 14, Cr 0.81, Glu 147, Ca 8.9. LFTs WNL: AST 23, ALT 20, Alk Phos 90, Tbili <0.2, Alb 4.6. VBG c/w resp acidosis/hypercapnia (HCO? 29.8), O? sat 98.3. Lactate 1.7. CXR: no acute cardiopulm  process; ETT in mid trachea, enteric tube in stomach. CT head ordered and pending for AMS. PCCM asked to admit for further workup and treatment.  Please see Significant Hospital Events section below for full detailed hospital course.   Pertinent  Medical History   Past Medical History:  Diagnosis Date   Allergy    Anxiety    Asthma    Bilateral carpal tunnel syndrome 12/31/2018   Bipolar affective, manic (HCC) 1996   COPD (chronic obstructive pulmonary disease) (HCC)    Depression    HLD (hyperlipidemia) 03/12/2018   Lung nodule, multiple 09/26/2016   Manic depressive illness (HCC) 1996   Seizures (HCC)    Substance abuse (HCC)    Tobacco abuse 10/04/2016    Micro Data:  02/29/24 SARS-CoV-2 PCR ? negative 02/29/24 Flu PCR ? negative 02/29/24 RVP ? negative 02/29/24 BCx2 ? no growth 02/29/24 MRSA PCR ? negative 02/29/24 Strep pneumo Ur Ag + 02/29/24 Legionella Ur Ag ?  negative 02/29/24: Respiratory viral panel>> negative 03/01/24: Tracheal aspirate>>normal flora 03/05/24: Repeat tracheal aspirate>>normal flora   Antimicrobials:   Anti-infectives (From admission, onward)    Start     Dose/Rate Route Frequency Ordered Stop   03/09/24 0800  ceFEPIme  (MAXIPIME ) 2 g in sodium chloride  0.9 % 100 mL IVPB        2 g 200 mL/hr over 30 Minutes Intravenous Every 8 hours 03/09/24 0635 03/11/24 1106   03/08/24 2015  ceFEPIme  (MAXIPIME ) 2 g in sodium chloride  0.9 % 100 mL IVPB  Status:  Discontinued        2 g 200 mL/hr over 30 Minutes Intravenous Every 12 hours 03/08/24 1205 03/09/24  9364   03/06/24 0000  ceFEPIme  (MAXIPIME ) 2 g in sodium chloride  0.9 % 100 mL IVPB  Status:  Discontinued        2 g 200 mL/hr over 30 Minutes Intravenous Every 8 hours 03/05/24 2329 03/08/24 1205   03/05/24 0200  cefTRIAXone  (ROCEPHIN ) 2 g in sodium chloride  0.9 % 100 mL IVPB  Status:  Discontinued        2 g 200 mL/hr over 30 Minutes Intravenous Every 24 hours 03/05/24 0050 03/05/24 2330   03/01/24  1000  doxycycline  (VIBRAMYCIN ) 100 mg in sodium chloride  0.9 % 250 mL IVPB  Status:  Discontinued        100 mg 125 mL/hr over 120 Minutes Intravenous Every 12 hours 03/01/24 0826 03/01/24 1003   03/01/24 0200  cefTRIAXone  (ROCEPHIN ) 1 g in sodium chloride  0.9 % 100 mL IVPB  Status:  Discontinued        1 g 200 mL/hr over 30 Minutes Intravenous Every 24 hours 03/01/24 0041 03/05/24 0050       Significant Hospital Events: Including procedures, antibiotic start and stop dates in addition to other pertinent events   1/16: Admitted to ICU for asthma/COPD overlap exacerbation with hypercapnic respiratory failure requiring emergent intubation. 03/01/24- patient failed SBT today, remains on mV. She has AKI.   Strep pneumoniae +, will continue Rocephin .  Husband at bedside I met with him and we reviewed medical plan.  03/02/24- patient had resp distress overnight.  She still has AKI but there is an interval improvement.  03/03/24- patient for SBT today.  AKI has improved this am.  She requires heavy sedation. She has family coming in this afternoon who wishes to be present for SBT. 03/04/24- patient passed SBT and was extubated but after 1 hour started to have respiratory distress with severe hypoxemia. Her daughter was present and requested re-intubation. We continue to treat her underlying Asthma COPD exacerbation with strep pneumoniae respiratory infection.  03/05/24- Patient failed SBT today with tachypnea/tachycardia/resp distress 03/06/24- Patient failed SBT and husband was present during evaluation, she was tachypneic and tachycardic. 03/07/24- Asynchronous with vent, with obstructive process/bronchospasm.  Steroids increased to 80 mg BID, Ketamine  gtt started, place A-line for frequent ABG's. 03/08/24 - paralyzed with worsening hypercapnia. Not a candidate for ECMO. Ketamine  increased to 3 mg/hour.  03/10/24- Overnight pt became hypoxic with O2 sats decreasing to 60% requiring 10 mg of iv vecuronium  x1  dose.  Due to inability to liberate from ventilator ENT consulted for possible tracheostomy placement.  CTA Chest obtained which was negative for acute PE or aortic dissection but suspicious for multifocal pneumonia   Interim History / Subjective:  As outlined above under Significant Hospital Events section  Objective   Blood pressure 129/78, pulse 90, temperature 99 F (37.2 C), resp. rate (!) 25, height 5' 6 (1.676 m), weight 74.5 kg, SpO2 (!) 87%.    Vent Mode: PRVC FiO2 (%):  [30 %-50 %] 40 % Set Rate:  [24 bmp] 24 bmp Vt Set:  [420 mL] 420 mL PEEP:  [5 cmH20-8 cmH20] 5 cmH20 Pressure Support:  [10 cmH20] 10 cmH20 Plateau Pressure:  [16 cmH20-18 cmH20] 16 cmH20   Intake/Output Summary (Last 24 hours) at 03/10/2024 1748 Last data filed at 03/10/2024 1555 Gross per 24 hour  Intake 1537.18 ml  Output 1725 ml  Net -187.82 ml   Filed Weights   03/09/24 0407 03/10/24 0420  Weight: 74.5 kg 74.5 kg    Examination: General: Critically ill-appearing female, lying  in bed, intubated and sedated HENT: Atraumatic, normocephalic, neck supple, no JVD, orally intubated Lungs: Expiratory expiratory wheezing throughout, even, non labored, synchronous  Cardiovascular: Regular rate and rhythm, S1-S2, no murmurs, rubs, gallops Abdomen: +BS xr4, Soft, nontender, nondistended, no guarding or tenderness Extremities: Normal bulk and tone, no deformities, no edema Neuro: Sedated, withdraws from pain, currently not following commands, PERRLA GU: Foley catheter in place  Resolved Hospital Problem list     Assessment & Plan:   #Acute Metabolic Encephalopathy #Sedation needs in setting of mechanical ventilation PMHx: Seizure disorder, anxiety, depression, bipolar disorder, polysubstance abuse  - Maintain a RASS goal of -3 to -4 (allowing for deeper sedation due to vent asynchrony) - PAD protocol to maintain RASS goal: propofol  and prn dilaudid   - Avoid sedating medications as able - Daily  wake up assessment deferred - Continue home Lamictal  100 mg daily - CT Head results pending   #Acute Hypoxic & Hypercapnic Respiratory Failure due to ... #Strep Pneumoniae Pneumonia #COPD/Asthma Exacerbation  PMHx: Tobacco use  CTA Chest 03/10/24:  Negative for acute pulmonary embolus or aortic dissection. Bilateral lower lobe partial consolidations with diffuse bilateral peribronchovascular mild ground-glass disease and small nodules, findings are suspicious for multifocal pneumonia. Imaging follow-up to resolution is recommended Multi vessel coronary vascular calcification.  Continues to auto-PEEP on vent, intermittent low Vt - Full vent support, implement lung protective strategies - Plateau pressures less than 30 cm H20 - Wean FiO2 & PEEP as tolerated to maintain O2 sats 88 to 92% - Follow intermittent Chest X-ray & ABG as needed - Spontaneous Breathing Trials when respiratory parameters met and mental status permits - Implement VAP Bundle - Scheduled and prn bronchodilator therapy  - IV Steroids (increase to 80 mg BID on 1/23) - ABX as above - Due to inability to liberate from mechanical ventilation ENT consulted for possible need for tracheostomy placement   #Sepsis  #Strep Pneumoniae Pneumonia #Leukocytosis - Trend WBC's and monitor fever curve  - Follow cultures as above - Continue empiric Cefepime  pending cultures & sensitivities  #Hyperglycemia - CBG's q4h; Target range of 140 to 180 - SSI - Follow ICU Hypo/Hyperglycemia protocol  Best Practice (right click and Reselect all SmartList Selections daily)   Diet/type: tubefeeds and NPO DVT prophylaxis: LMWH GI prophylaxis: PPI Lines: Left radial arterial line  Foley:  Yes, and it is still needed Code Status:  full code Last date of multidisciplinary goals of care discussion [03/10/24]  1/26: Pts spouse updated at bedside by Dr. Isadora regarding pts condition and current plan of care  Labs   CBC: Recent Labs  Lab  03/06/24 0225 03/07/24 0413 03/08/24 0504 03/09/24 0321 03/10/24 1108  WBC 10.5 12.1* 13.9* 10.2 16.8*  NEUTROABS  --   --   --   --  14.7*  HGB 10.1* 10.9* 10.8* 9.3* 10.4*  HCT 32.1* 36.6 36.0 30.5* 33.0*  MCV 95.3 98.1 99.4 96.5 92.7  PLT 264 272 302 267 330    Basic Metabolic Panel: Recent Labs  Lab 03/06/24 0225 03/07/24 0413 03/08/24 0504 03/08/24 1716 03/09/24 0321 03/10/24 0159  NA 135 141 142  --  142 144  K 4.6 5.0 5.4* 5.0 5.1 4.7  CL 102 104 106  --  108 110  CO2 25 29 28   --  26 27  GLUCOSE 120* 131* 170*  --  148* 116*  BUN 25* 28* 35*  --  26* 30*  CREATININE 0.72 0.87 1.15*  --  0.79 0.76  CALCIUM  8.3* 9.9 9.4  --  9.2 8.9  MG 2.2 2.2 3.2*  --  3.0* 2.4  PHOS 3.4 3.5 5.4*  --  2.6 2.3*   GFR: Estimated Creatinine Clearance: 79.1 mL/min (by C-G formula based on SCr of 0.76 mg/dL). Recent Labs  Lab 03/05/24 0420 03/06/24 0225 03/07/24 0413 03/08/24 0504 03/09/24 0321 03/10/24 1108  PROCALCITON <0.10 <0.10  --   --   --   --   WBC 10.3 10.5 12.1* 13.9* 10.2 16.8*    Liver Function Tests: Recent Labs  Lab 03/07/24 0410  AST 26  ALT 28  ALKPHOS 57  BILITOT <0.2  PROT 6.2*  ALBUMIN 3.5   No results for input(s): LIPASE, AMYLASE in the last 168 hours. No results for input(s): AMMONIA in the last 168 hours.  ABG    Component Value Date/Time   PHART 7.37 03/10/2024 1340   PCO2ART 49 (H) 03/10/2024 1340   PO2ART 65 (L) 03/10/2024 1340   HCO3 28.3 (H) 03/10/2024 1340   ACIDBASEDEF 0.3 03/09/2024 1644   O2SAT 94 03/10/2024 1340     Coagulation Profile: No results for input(s): INR, PROTIME in the last 168 hours.  Cardiac Enzymes: Recent Labs  Lab 03/09/24 1644  CKTOTAL 93    HbA1C: Hemoglobin A1C  Date/Time Value Ref Range Status  09/21/2021 02:00 PM 6.4 (A) 4.0 - 5.6 % Final  03/22/2021 09:52 AM 6.2 (A) 4.0 - 5.6 % Final  01/15/2014 12:00 AM 6.8  Final   HbA1c, POC (prediabetic range)  Date/Time Value Ref Range  Status  05/11/2020 10:30 AM 0 (A) 5.7 - 6.4 % Final   HbA1c, POC (controlled diabetic range)  Date/Time Value Ref Range Status  05/11/2020 10:30 AM 0.0 0.0 - 7.0 % Final   HbA1c POC (<> result, manual entry)  Date/Time Value Ref Range Status  05/11/2020 10:30 AM 6.5 4.0 - 5.6 % Final   Hgb A1c MFr Bld  Date/Time Value Ref Range Status  03/01/2024 02:02 AM 6.4 (H) 4.8 - 5.6 % Final    Comment:    (NOTE) Diagnosis of Diabetes The following HbA1c ranges recommended by the American Diabetes Association (ADA) may be used as an aid in the diagnosis of diabetes mellitus.  Hemoglobin             Suggested A1C NGSP%              Diagnosis  <5.7                   Non Diabetic  5.7-6.4                Pre-Diabetic  >6.4                   Diabetic  <7.0                   Glycemic control for                       adults with diabetes.    12/24/2019 11:08 AM 6.7 (H) 4.8 - 5.6 % Final    Comment:             Prediabetes: 5.7 - 6.4          Diabetes: >6.4          Glycemic control for adults with diabetes: <7.0     CBG: Recent Labs  Lab 03/09/24 1951 03/09/24 2343 03/10/24 0316 03/10/24  9256 03/10/24 1235  GLUCAP 87 94 115* 106* 90    Review of Systems:   Unable to assess due to intubation/sedation/critical illness   Past Medical History:  She,  has a past medical history of Allergy, Anxiety, Asthma, Bilateral carpal tunnel syndrome (12/31/2018), Bipolar affective, manic (HCC) (1996), COPD (chronic obstructive pulmonary disease) (HCC), Depression, HLD (hyperlipidemia) (03/12/2018), Lung nodule, multiple (09/26/2016), Manic depressive illness (HCC) (1996), Seizures (HCC), Substance abuse (HCC), and Tobacco abuse (10/04/2016).   Surgical History:   Past Surgical History:  Procedure Laterality Date   ABDOMINAL HYSTERECTOMY     CARPAL TUNNEL RELEASE Right 01/15/2019   Procedure: RIGHTCARPAL TUNNEL RELEASE, LEFT DEQUERVAINS INJECTION;  Surgeon: Jerri Kay HERO, MD;  Location:  [facility];  Service: Orthopedics;  Laterality: Right;   CARPAL TUNNEL RELEASE Left 02/26/2019   Procedure: LEFT CARPAL TUNNEL RELEASE;  Surgeon: Jerri Kay HERO, MD;  Location: [facility];  Service: Orthopedics;  Laterality: Left;  Bier block   COLONOSCOPY WITH PROPOFOL  N/A 11/28/2018   Procedure: COLONOSCOPY WITH PROPOFOL ;  Surgeon: Therisa Bi, MD;  [facility] END[facility];  Service: Gastroenterology;  Laterality: N/A;   TUBAL LIGATION       Social History:   reports that she has been smoking cigarettes. Her smokeless tobacco use includes chew and snuff. She reports that she does not currently use alcohol. She reports that she does not use drugs.   Family History:  Her family history includes Bipolar disorder in her brother and brother; CAD in her father; Heart attack in her father; Other in her mother; Schizophrenia in her brother and brother; Stroke in her mother.   Allergies Allergies[1]   Home Medications  Prior to Admission medications  Medication Sig Start Date End Date Taking? Authorizing Provider  albuterol  (PROVENTIL  HFA) 108 (90 Base) MCG/ACT inhaler Inhale 2 puffs into the lungs every 4-6 hours as needed. 09/21/21  Yes Iloabachie, Chioma E, NP  amphetamine-dextroamphetamine (ADDERALL XR) 10 MG 24 hr capsule Take 10 mg by mouth daily.   Yes [provider]  citalopram  (CELEXA ) 40 MG tablet Take 1 tablet (40 mg total) by mouth daily. 11/21/22  Yes   Fluticasone  Furoate (ARNUITY ELLIPTA ) 200 MCG/ACT AEPB Inhale 1 puff into the lungs daily. 12/06/23  Yes Dgayli, Belva, MD  hydrOXYzine  (ATARAX ) 50 MG tablet Take 1-1.5 tablets (50-75 mg total) by mouth at bedtime as needed for sleep 11/21/22  Yes   lamoTRIgine  (LAMICTAL ) 100 MG tablet Take 1 tablet (100 mg total) by mouth daily. 11/21/22  Yes   rosuvastatin  (CRESTOR ) 10 MG tablet Take 10 mg by mouth at bedtime. 08/13/23  Yes [provider]  traMADol (ULTRAM) 50 MG tablet Take 1 tablet by  mouth every 6 (six) hours as needed.   Yes [provider]  umeclidinium-vilanterol (ANORO ELLIPTA ) 62.5-25 MCG/ACT AEPB Inhale 1 puff into the lungs daily. 12/06/23  Yes Dgayli, Belva, MD  ipratropium-albuterol  (DUONEB) 0.5-2.5 (3) MG/3ML SOLN INHALE CONTENTS OF ONE VIAL ( TOTAL) USING NEBULIZER  ONCE EVERY 6 HOURS. 11/02/20 12/06/23  Iloabachie, Chioma E, NP  atomoxetine  (STRATTERA ) 25 MG capsule Take 1 capsule (25 mg total) by mouth once every evening after dinner. Patient not taking: Reported on 12/23/2020 10/26/20 03/09/21    loratadine  (CLARITIN ) 10 MG tablet Take 1 tablet (10 mg total) by mouth daily as needed for allergies. 12/16/19 12/25/19  Iloabachie, Chioma E, NP     Critical care time: 50 minutes     Lonell Moose, AGNP  Pulmonary/Critical  Care Pager [phone] (please enter 7 digits) PCCM Consult Pager [phone] (please enter 7 digits)          [1]  Allergies Allergen Reactions   Flax Seed Oil [Flax Seed Oil] Hives    Pt. Self reported   Other Hives    Pt. Self reported   Trazodone Other (See Comments)    Bed wetting  Other Reaction(s): Other (See Comments)    Bed wetting   "

## 2024-03-10 NOTE — Consult Note (Signed)
 Erica Gomez, Erica Gomez [id number] [birth year] Isadora Hose, MD  Reason for Consult: tracheostomy tube placement  HPI: 59 year old female with history of asthma and COPD currently intubated with failure to wean from ventilatory support.  Asked to evaluate patient due to concern for need for long term ventilatory support.  Patient required emergent intubation on 1/16, initially extubated 1/20 but lasted one hour.  On high dose steroids and ketamine  for asynchronous breathing with ventilator.    Allergies: Allergies[1]  ROS: Review of systems normal other than 12 systems except per HPI.  PMH:  Past Medical History:  Diagnosis Date   Allergy    Anxiety    Asthma    Bilateral carpal tunnel syndrome 12/31/2018   Bipolar affective, manic (HCC) 1996   COPD (chronic obstructive pulmonary disease) (HCC)    Depression    HLD (hyperlipidemia) 03/12/2018   Lung nodule, multiple 09/26/2016   Manic depressive illness (HCC) 1996   Seizures (HCC)    Substance abuse (HCC)    Tobacco abuse 10/04/2016    FH:  Family History  Problem Relation Age of Onset   Stroke Mother    Other Mother        blood clot   Heart attack Father    CAD Father    Bipolar disorder Brother    Schizophrenia Brother    Schizophrenia Brother    Bipolar disorder Brother     SH:  Social History   Socioeconomic History   Marital status: Married    Spouse name: Not on file   Number of children: 2   Years of education: taking some medical billing classes now   Highest education level: Not on file  Occupational History   Occupation: Caretaker  Tobacco Use   Smoking status: Every Day    Current packs/day: 0.50    Types: Cigarettes   Smokeless tobacco: Current    Types: Chew, Snuff    Last attempt to quit: 2018   Tobacco comments:    Smokes 0.5 PPD- khj 12/06/2023        Using nicotine  pouch to try and help her quit smoking.   Vaping Use   Vaping status: Former   Quit date: 08/11/2016  Substance and Sexual  Activity   Alcohol use: Not Currently    Comment: rare   Drug use: No    Types: Crack cocaine    Comment: history 2013   Sexual activity: Yes    Birth control/protection: Condom, None  Other Topics Concern   Not on file  Social History Narrative   Lives with her husband.   Right-handed.   Caffeine use: 2 cups daily.   Social Drivers of Health   Tobacco Use: High Risk (02/29/2024)   Patient History    Smoking Tobacco Use: Every Day    Smokeless Tobacco Use: Current    Passive Exposure: Not on file  Financial Resource Strain: Not on file  Food Insecurity: Patient Unable To Answer (02/29/2024)   Epic    Worried About Programme Researcher, Broadcasting/film/video in the Last Year: Patient unable to answer    Ran Out of Food in the Last Year: Patient unable to answer  Transportation Needs: Patient Unable To Answer (02/29/2024)   Epic    Lack of Transportation (Medical): Patient unable to answer    Lack of Transportation (Non-Medical): Patient unable to answer  Physical Activity: Not on file  Stress: Not on file  Social Connections: Not on file  Intimate Partner Violence: Patient Unable To  Answer (02/29/2024)   Epic    Fear of Current or Ex-Partner: Patient unable to answer    Emotionally Abused: Patient unable to answer    Physically Abused: Patient unable to answer    Sexually Abused: Patient unable to answer  Depression (PHQ2-9): Not on file  Alcohol Screen: Not on file  Housing: Patient Unable To Answer (02/29/2024)   Epic    Unable to Pay for Housing in the Last Year: Patient unable to answer    Number of Times Moved in the Last Year: Not on file    Homeless in the Last Year: Patient unable to answer  Utilities: Patient Unable To Answer (02/29/2024)   Epic    Threatened with loss of utilities: Patient unable to answer  Health Literacy: Not on file    PSH:  Past Surgical History:  Procedure Laterality Date   ABDOMINAL HYSTERECTOMY     CARPAL TUNNEL RELEASE Right 01/15/2019   Procedure:  RIGHTCARPAL TUNNEL RELEASE, LEFT DEQUERVAINS INJECTION;  Surgeon: Jerri Kay HERO, MD;  Location: [facility];  Service: Orthopedics;  Laterality: Right;   CARPAL TUNNEL RELEASE Left 02/26/2019   Procedure: LEFT CARPAL TUNNEL RELEASE;  Surgeon: Jerri Kay HERO, MD;  Location: [facility];  Service: Orthopedics;  Laterality: Left;  Bier block   COLONOSCOPY WITH PROPOFOL  N/A 11/28/2018   Procedure: COLONOSCOPY WITH PROPOFOL ;  Surgeon: Therisa Bi, MD;  Location: [facility] ENDOSCOPY;  Service: Gastroenterology;  Laterality: N/A;   TUBAL LIGATION      Physical  Exam:  GEN-  sedated and unresponsive on ventilatory support NEURO-  unresponsive EARS-  external ears clear NOSE-  no mucopus or polyps OC/OP-  ETT in place and secure NECK- supple, no LAD, normal landmarks RESP- ventilator support CARD-  regular  A/P: Respiratory failure with need for long term ventilatory support  Plan:  Discussed with patient's husband Erica Gomez over the phone.  Discussed procedure and risks associated including bleeding, infection, secretions, and that tracheostomy tube may not be temporary and could be permanent.  He understood procedure and risks and agreed to proceed.  If nursing can please have him sign and witness prior to procedure.  Procedure planned at this time for Wednesday at noon.  Please hold tube feels and anticoagulation as per protocol.   Kionte Baumgardner 03/10/2024 1:57 PM       [1]  Allergies Allergen Reactions   Flax Seed Oil [Flax Seed Oil] Hives    Pt. Self reported   Other Hives    Pt. Self reported   Trazodone Other (See Comments)    Bed wetting  Other Reaction(s): Other (See Comments)    Bed wetting

## 2024-03-10 NOTE — Progress Notes (Signed)
 PHARMACY CONSULT NOTE - ELECTROLYTES  Pharmacy Consult for Electrolyte Monitoring and Replacement   Recent Labs: Height: 5' 6 (167.6 cm) Weight: 74.5 kg (164 lb 3.9 oz) IBW/kg (Calculated) : 59.3 Estimated Creatinine Clearance: 79.1 mL/min (by C-G formula based on SCr of 0.76 mg/dL).  Potassium (mmol/L)  Date Value  03/10/2024 4.7  11/12/2013 4.1   Magnesium  (mg/dL)  Date Value  98/73/7973 2.4   Calcium  (mg/dL)  Date Value  98/73/7973 8.9   Calcium , Total (mg/dL)  Date Value  90/69/7984 9.0   Albumin (g/dL)  Date Value  98/76/7973 3.5  05/12/2020 4.5  11/12/2013 3.5   Phosphorus (mg/dL)  Date Value  98/73/7973 2.3 (L)   Sodium (mmol/L)  Date Value  03/10/2024 144  12/06/2022 139  11/12/2013 141   Assessment  Erica Gomez is a 59 y.o. female presenting with respiratory failure. PMH significant for COPD, asthma, CAD, seizures, and bipolar disorder. Pt has been hyperkalemic up to 6.1 on 1/17. Pharmacy has been consulted to monitor and replace electrolytes.  Diet: Tube feeds at goal  + free water  30mL Q4H  MIVF: lactated ringers  at 100 mL/hr  Goal of Therapy: Electrolytes WNL  Plan:  --No electrolyte replacement indicated today --Follow-up electrolytes with AM labs tomorrow  Thank you for allowing pharmacy to be a part of this patient's care.  [name] 03/10/2024 7:16 AM

## 2024-03-10 NOTE — Progress Notes (Signed)
 Nutrition Follow Up Note   DOCUMENTATION CODES:   Not applicable  INTERVENTION:   Initiate Vital HP@20ml /hr + ProSource TF 20- Give 60ml QID via tube.  Propofol : 35.8 ml/hr- provides 945kcal/day   Free water  flushes 30ml q4 hours to maintain tube patency   Regimen at goal rate provides 1745kcal/day, 122g/day protein and 577ml/day of free water .   MVI daily via tube   Continue thiamine  100mg  daily via tube  Pt remains at high refeed risk; recommend monitor potassium, magnesium  and phosphorus labs daily until stable  Daily weights   NUTRITION DIAGNOSIS:   Inadequate oral intake related to inability to eat (pt sedated and ventilated) as evidenced by NPO status. -ongoing   GOAL:   Provide needs based on ASPEN/SCCM guidelines -not met   MONITOR:   TF tolerance, Vent status, Labs, Weight trends, Skin, I & O's  ASSESSMENT:   59 y/o female with h/o DM, COPD, seizures, lung nodules, CAD, anxiety, MDD, bipolar disorder, HLD and substance abuse who is admitted with AMS, COPD, exacerbation, PNA and sepsis.  RD working remotely.  Pt remains sedated and ventilated. OGT in place to LIS with output. Tube feeds being held since 1/24 as tan colored secretions noted from pt's mouth. Protonix  initiated. Pt is having bowel function with minimal output from OGT. Plan is to resume tube feeds today at trickle rate. Pt remains on propofol . Pt remains at high refeed risk; will continue thiamine  via tube. Per chart, pt is down ~2lbs from her UBW if her bed weights are correct; note pt's last 4 weights in the chart are the exact same. Pt +9.2L on her I & Os.   Medications reviewed and include: celexa , heparin , insulin , solu-medrol , nicotine ,  protonix , miralax , senokot, cefepime , hydromorphone , LRS @100ml /hr, propofol    Labs reviewed: K 4.7 wnl, BUN 30(H), P 2.3(L), Mg 2.4 wnl Wbc- 16.8(H), Hgb 10.4(L), Hct 33.0(L) Cbgs- 90, 106, 155 x 24 hrs   Patient is currently intubated on  ventilator support MV: 9.6 L/min Temp (24hrs), Avg:98.5 F (36.9 C), Min:97.2 F (36.2 C), Max:99.9 F (37.7 C)  Propofol : 35.8 ml/hr- provides 945kcal/day   MAP >45mmHg   UOP-   Diet Order:    Diet Order     None      EDUCATION NEEDS:   No education needs have been identified at this time  Skin:  Skin Assessment: Reviewed RN Assessment  Last BM:  1/25- type 6  Height:   Ht Readings from Last 1 Encounters:  02/29/24 5' 6 (1.676 m)    Weight:   Wt Readings from Last 1 Encounters:  03/10/24 74.5 kg    Ideal Body Weight:  59 kg  BMI:  Body mass index is 26.51 kg/m.  Estimated Nutritional Needs:   Kcal:  1800-2100kcal/day  Protein:  105-120g/day  Fluid:  1.6-1.8L/day  Augustin Shams MS, RD, LDN If unable to be reached, please send secure chat to RD inpatient available from 8:00a-4:00p daily

## 2024-03-10 NOTE — Plan of Care (Signed)
" °  Problem: Health Behavior/Discharge Planning: Goal: Ability to manage health-related needs will improve Outcome: Not Progressing   Problem: Clinical Measurements: Goal: Ability to maintain clinical measurements within normal limits will improve Outcome: Not Progressing Goal: Will remain free from infection Outcome: Progressing Goal: Diagnostic test results will improve Outcome: Not Progressing Goal: Respiratory complications will improve Outcome: Not Progressing Goal: Cardiovascular complication will be avoided Outcome: Not Progressing   Problem: Activity: Goal: Risk for activity intolerance will decrease Outcome: Progressing   "

## 2024-03-10 NOTE — TOC Progression Note (Signed)
 Transition of Care [facility]) - Progression Note    Patient Details  Name: Erica Gomez MRN: [id number] Date of Birth: 10-03-1965  [facility] of [facility]) CM/SW Contact  [name], LCSW Phone Number: 03/10/2024, 9:39 AM  Clinical Narrative:    Chart reviewed. Patient remains intubated.                      Expected Discharge Plan and Services                                               Social Drivers of Health (SDOH) Interventions SDOH Screenings   Food Insecurity: Patient Unable To Answer (02/29/2024)  Housing: Patient Unable To Answer (02/29/2024)  Transportation Needs: Patient Unable To Answer (02/29/2024)  Utilities: Patient Unable To Answer (02/29/2024)  Tobacco Use: High Risk (02/29/2024)    Readmission Risk Interventions     No data to display

## 2024-03-11 DIAGNOSIS — J9601 Acute respiratory failure with hypoxia: Secondary | ICD-10-CM

## 2024-03-11 DIAGNOSIS — D649 Anemia, unspecified: Secondary | ICD-10-CM

## 2024-03-11 LAB — CBC
HCT: 30 % — ABNORMAL LOW (ref 36.0–46.0)
Hemoglobin: 9.3 g/dL — ABNORMAL LOW (ref 12.0–15.0)
MCH: 29.8 pg (ref 26.0–34.0)
MCHC: 31 g/dL (ref 30.0–36.0)
MCV: 96.2 fL (ref 80.0–100.0)
Platelets: 289 10*3/uL (ref 150–400)
RBC: 3.12 MIL/uL — ABNORMAL LOW (ref 3.87–5.11)
RDW: 14.2 % (ref 11.5–15.5)
WBC: 17.9 10*3/uL — ABNORMAL HIGH (ref 4.0–10.5)
nRBC: 0.1 % (ref 0.0–0.2)

## 2024-03-11 LAB — GLUCOSE, CAPILLARY
Glucose-Capillary: 124 mg/dL — ABNORMAL HIGH (ref 70–99)
Glucose-Capillary: 73 mg/dL (ref 70–99)
Glucose-Capillary: 118 mg/dL — ABNORMAL HIGH (ref 70–99)
Glucose-Capillary: 109 mg/dL — ABNORMAL HIGH (ref 70–99)
Glucose-Capillary: 103 mg/dL — ABNORMAL HIGH (ref 70–99)
Glucose-Capillary: 97 mg/dL (ref 70–99)

## 2024-03-11 LAB — BLOOD GAS, ARTERIAL
Acid-Base Excess: 5.2 mmol/L — ABNORMAL HIGH (ref 0.0–2.0)
Acid-Base Excess: 2.7 mmol/L — ABNORMAL HIGH (ref 0.0–2.0)
Acid-Base Excess: 3 mmol/L — ABNORMAL HIGH (ref 0.0–2.0)
Bicarbonate: 31.5 mmol/L — ABNORMAL HIGH (ref 20.0–28.0)
Bicarbonate: 27.9 mmol/L (ref 20.0–28.0)
Bicarbonate: 29 mmol/L — ABNORMAL HIGH (ref 20.0–28.0)
FIO2: 40 %
MECHVT: 420 mL
Mechanical Rate: 24
O2 Saturation: 97.5 %
O2 Saturation: 97.1 %
O2 Saturation: 98.9 %
PEEP: 5 cmH2O
Patient temperature: 37
Patient temperature: 37
Patient temperature: 37
pCO2 arterial: 52 mmHg — ABNORMAL HIGH (ref 32–48)
pCO2 arterial: 44 mmHg (ref 32–48)
pCO2 arterial: 49 mmHg — ABNORMAL HIGH (ref 32–48)
pH, Arterial: 7.39 (ref 7.35–7.45)
pH, Arterial: 7.41 (ref 7.35–7.45)
pH, Arterial: 7.38 (ref 7.35–7.45)
pO2, Arterial: 99 mmHg (ref 83–108)
pO2, Arterial: 85 mmHg (ref 83–108)
pO2, Arterial: 141 mmHg — ABNORMAL HIGH (ref 83–108)

## 2024-03-11 LAB — BASIC METABOLIC PANEL WITH GFR
Anion gap: 7 (ref 5–15)
BUN: 35 mg/dL — ABNORMAL HIGH (ref 6–20)
CO2: 27 mmol/L (ref 22–32)
Calcium: 9.5 mg/dL (ref 8.9–10.3)
Chloride: 110 mmol/L (ref 98–111)
Creatinine, Ser: 0.7 mg/dL (ref 0.44–1.00)
GFR, Estimated: >60 mL/min
Glucose, Bld: 125 mg/dL — ABNORMAL HIGH (ref 70–99)
Potassium: 4.8 mmol/L (ref 3.5–5.1)
Sodium: 144 mmol/L (ref 135–145)

## 2024-03-11 LAB — MAGNESIUM: Magnesium: 2.1 mg/dL (ref 1.7–2.4)

## 2024-03-11 LAB — PHOSPHORUS: Phosphorus: 3.1 mg/dL (ref 2.5–4.6)

## 2024-03-11 MED ORDER — VECURONIUM BROMIDE 10 MG IV SOLR
10.0000 mg | Freq: Once | INTRAVENOUS | Status: AC
  Administered 2024-03-11: 10 mg via INTRAVENOUS
  Filled 2024-03-11: qty 10

## 2024-03-11 MED ORDER — HYDRALAZINE HCL 20 MG/ML IJ SOLN
10.0000 mg | Freq: Once | INTRAMUSCULAR | Status: AC
  Administered 2024-03-11: 10 mg via INTRAVENOUS
  Filled 2024-03-11: qty 1

## 2024-03-11 NOTE — Progress Notes (Addendum)
 1105 Propofol  turned from 80 to 40 to start wake up assessment.  1121 Tylenol  given for pain.   1518 Propofol  restarted and rate changed from 40 to 80 to fully sedate the patient after failing wakeup assessment trial.  1700 Tube feeds turned off and held as the patient was vomiting. Dilaudid  restarted at 3 mg/hr.  1840 Patient will go down for a tracheostomy tomorrow at 1200 pm.

## 2024-03-11 NOTE — Progress Notes (Signed)
 PHARMACY CONSULT NOTE - ELECTROLYTES  Pharmacy Consult for Electrolyte Monitoring and Replacement   Recent Labs: Height: 5' 6 (167.6 cm) Weight: 74.5 kg (164 lb 3.9 oz) IBW/kg (Calculated) : 59.3 Estimated Creatinine Clearance: 79.1 mL/min (by C-G formula based on SCr of 0.7 mg/dL).  Potassium (mmol/L)  Date Value  03/11/2024 4.8  11/12/2013 4.1   Magnesium  (mg/dL)  Date Value  98/72/7973 2.1   Calcium  (mg/dL)  Date Value  98/72/7973 9.5   Calcium , Total (mg/dL)  Date Value  90/69/7984 9.0   Albumin (g/dL)  Date Value  98/76/7973 3.5  05/12/2020 4.5  11/12/2013 3.5   Phosphorus (mg/dL)  Date Value  98/72/7973 3.1   Sodium (mmol/L)  Date Value  03/11/2024 144  12/06/2022 139  11/12/2013 141   Assessment  Erica Gomez is a 59 y.o. female presenting with respiratory failure. PMH significant for COPD, asthma, CAD, seizures, and bipolar disorder. Pt has been hyperkalemic up to 6.1 on 1/17. Pharmacy has been consulted to monitor and replace electrolytes.  Diet: Tube feeds 39mL/hr + free water  30mL Q4H  MIVF: NS @ 20mL/hr  Goal of Therapy: Electrolytes WNL  Plan:  --No electrolyte replacement indicated today --Follow-up electrolytes with AM labs tomorrow  Thank you for allowing pharmacy to be a part of this patient's care.  Belvie Macintosh, PharmD Candidate 03/11/2024 8:07 AM

## 2024-03-11 NOTE — Plan of Care (Signed)
" °  Problem: Education: Goal: Knowledge of General Education information will improve Description: Including pain rating scale, medication(s)/side effects and non-pharmacologic comfort measures Outcome: Not Progressing   Problem: Health Behavior/Discharge Planning: Goal: Ability to manage health-related needs will improve Outcome: Not Progressing   Problem: Clinical Measurements: Goal: Ability to maintain clinical measurements within normal limits will improve Outcome: Not Progressing Goal: Will remain free from infection Outcome: Not Progressing Goal: Diagnostic test results will improve Outcome: Not Progressing Goal: Respiratory complications will improve Outcome: Not Progressing Goal: Cardiovascular complication will be avoided Outcome: Not Progressing   Problem: Activity: Goal: Risk for activity intolerance will decrease Outcome: Not Progressing   Problem: Nutrition: Goal: Adequate nutrition will be maintained Outcome: Not Progressing   Problem: Coping: Goal: Level of anxiety will decrease Outcome: Not Progressing   Problem: Elimination: Goal: Will not experience complications related to bowel motility Outcome: Not Progressing Goal: Will not experience complications related to urinary retention Outcome: Not Progressing   Problem: Pain Managment: Goal: General experience of comfort will improve and/or be controlled Outcome: Not Progressing   Problem: Safety: Goal: Ability to remain free from injury will improve Outcome: Not Progressing   Problem: Skin Integrity: Goal: Risk for impaired skin integrity will decrease Outcome: Not Progressing   Problem: Education: Goal: Ability to describe self-care measures that may prevent or decrease complications (Diabetes Survival Skills Education) will improve Outcome: Not Progressing Goal: Individualized Educational Video(s) Outcome: Not Progressing   Problem: Coping: Goal: Ability to adjust to condition or change in  health will improve Outcome: Not Progressing   Problem: Fluid Volume: Goal: Ability to maintain a balanced intake and output will improve Outcome: Not Progressing   Problem: Health Behavior/Discharge Planning: Goal: Ability to identify and utilize available resources and services will improve Outcome: Not Progressing Goal: Ability to manage health-related needs will improve Outcome: Not Progressing   Problem: Metabolic: Goal: Ability to maintain appropriate glucose levels will improve Outcome: Not Progressing   Problem: Nutritional: Goal: Maintenance of adequate nutrition will improve Outcome: Not Progressing Goal: Progress toward achieving an optimal weight will improve Outcome: Not Progressing   Problem: Skin Integrity: Goal: Risk for impaired skin integrity will decrease Outcome: Not Progressing   Problem: Tissue Perfusion: Goal: Adequacy of tissue perfusion will improve Outcome: Not Progressing   Problem: Activity: Goal: Ability to tolerate increased activity will improve Outcome: Not Progressing   Problem: Respiratory: Goal: Ability to maintain a clear airway and adequate ventilation will improve Outcome: Not Progressing   Problem: Role Relationship: Goal: Method of communication will improve Outcome: Not Progressing   Problem: Activity: Goal: Ability to tolerate increased activity will improve Outcome: Not Progressing   Problem: Clinical Measurements: Goal: Ability to maintain a body temperature in the normal range will improve Outcome: Not Progressing   Problem: Respiratory: Goal: Ability to maintain adequate ventilation will improve Outcome: Not Progressing Goal: Ability to maintain a clear airway will improve Outcome: Not Progressing   "

## 2024-03-11 NOTE — Progress Notes (Signed)
" ° ° °  PROCEDURAL EXPEDITER PROGRESS NOTE  Patient Name: ARIETTA EISENSTEIN  DOB:Oct 16, 1965 Date of Admission: 02/29/2024  Date of Assessment:03/11/24   -------------------------------------------------------------------------------------------------------------------   Brief clinical summary: Patient going for tracheostomy creation surgery on 1/28.  Orders in place:   Yes   Communication with surgical team if no orders: none  Labs, test, and orders reviewed: yes  Requires surgical clearance:   No  Barriers noted: none   Intervention provided by [facility] team: n/a  Barrier resolved:   not applicable   -------------------------------------------------------------------------------------------------------------------  [employer], Rexene LITTIE Kirks Please contact us  directly via secure chat (search for [facility]) or by calling us  at [phone] [facility]).  "

## 2024-03-11 NOTE — Progress Notes (Signed)
 Updated pts spouse Devynn Hessler via telephone and informed him Mrs.Pinkard is showing signs of respiratory distress with accessory muscle use and hypertension sbp 180's during spontaneous breathing trial.  Therefore, will proceed with increasing sedating medication and placing pt back on Full Ventilator Support.  Mr. Lemanski is an agreement with recommendation to proceed with tracheostomy placement on 01/28.  I asked if I could update pts daughter at bedside regarding plan of care, and Mr Lusty stated I could update pts daughter.  He also asked if I could tell pts daughter to call him.  I spoke with pts daughter at bedside, discussed the plan of care outlined above and informed her pts spouse wants her to call him.  Pts daughter appreciated the update and stated she would call Mr Rosner.  Will continue to monitor and assess pt   Lonell Moose, [facility]  Pulmonary/Critical Care Pager [phone] (please enter 7 digits) PCCM Consult Pager [phone] (please enter 7 digits)

## 2024-03-11 NOTE — Progress Notes (Signed)
 "  NAME:  Erica Gomez, MRN:  [id number], DOB:  Oct 01, 1965, LOS: 11 ADMISSION DATE:  02/29/2024, CONSULTATION DATE:  02/29/24 REFERRING MD:  Dr. Willo, CHIEF COMPLAINT:  Acute Respiratory Distress    Brief Pt Description / Synopsis:  60 y.o. female admitted with Acute Hypoxic and Hypercapnic Respiratory Failure due to Strep Pneumoniae Pneumonia, severe COPD/Asthma Exacerbation requiring intubation and mechanical ventilation.   History of Present Illness:  59 y/o F with PMH sig for asthma, COPD, tobacco use disorder, bipolar disorder, seizure, polysubstance abuse, anxiety/depression, and HLD presented to ED BIB [facility] from home for acute respiratory distress.   On Chart review, patient follows with pulmonologist; last visit with Dr. Isadora on 10/25. Meds prescribed at visit included fluticasone  furoate (Arnuity Ellipta ) 200 mcg 1 puff daily and umeclidinium-vilanterol (Anoro Ellipta ) 62.5/25 mcg 1 puff daily. Per husband, patient has been taking medications as prescribed. EMS tx included MgSO? 2 g IV, Solu-Medrol  125 mg IV, DuoNeb x1, and albuterol  x1. During transport, pt became combative/confused and received Haldol 4 mg IM and Versed  5 mg IM. Initially A&O with EMS. SpO? 94% on RA.   ED Course: Initial VS: BP 211/120, HR 142, SpO? 94% on RA. Patient was unresponsive with labored/heaving respirations and in severe resp distress. Concern for hypercapnic resp failure contributing to agitation and AMS. Pt subsequently lost ability to protect airway. Given worsening MS and resp failure, decision made for emergent intubation. Pt received ketamine  and rocuronium  and was intubated successfully without complication.   Labs/Imaging: Labs: WBC 10.7, Hgb 12.7, Hct 40.9, Plt 313. BMP: Na 141, K 4.9, Cl 106, CO? 25, BUN 14, Cr 0.81, Glu 147, Ca 8.9. LFTs WNL: AST 23, ALT 20, Alk Phos 90, Tbili <0.2, Alb 4.6. VBG c/w resp acidosis/hypercapnia (HCO? 29.8), O? sat 98.3. Lactate 1.7. CXR: no acute cardiopulm  process; ETT in mid trachea, enteric tube in stomach. CT head ordered and pending for AMS. PCCM asked to admit for further workup and treatment.  Please see Significant Hospital Events section below for full detailed hospital course.   Pertinent  Medical History   Past Medical History:  Diagnosis Date   Allergy    Anxiety    Asthma    Bilateral carpal tunnel syndrome 12/31/2018   Bipolar affective, manic (HCC) 1996   COPD (chronic obstructive pulmonary disease) (HCC)    Depression    HLD (hyperlipidemia) 03/12/2018   Lung nodule, multiple 09/26/2016   Manic depressive illness (HCC) 1996   Seizures (HCC)    Substance abuse (HCC)    Tobacco abuse 10/04/2016    Micro Data:  02/29/24 SARS-CoV-2 PCR ? negative 02/29/24 Flu PCR ? negative 02/29/24 RVP ? negative 02/29/24 BCx2 ? no growth 02/29/24 MRSA PCR ? negative 02/29/24 Strep pneumo Ur Ag + 02/29/24 Legionella Ur Ag ?  negative 02/29/24: Respiratory viral panel>> negative 03/01/24: Tracheal aspirate>>normal flora 03/05/24: Repeat tracheal aspirate>>normal flora   Antimicrobials:   Anti-infectives (From admission, onward)    Start     Dose/Rate Route Frequency Ordered Stop   03/09/24 0800  ceFEPIme  (MAXIPIME ) 2 g in sodium chloride  0.9 % 100 mL IVPB        2 g 200 mL/hr over 30 Minutes Intravenous Every 8 hours 03/09/24 0635 03/11/24 0925   03/08/24 2015  ceFEPIme  (MAXIPIME ) 2 g in sodium chloride  0.9 % 100 mL IVPB  Status:  Discontinued        2 g 200 mL/hr over 30 Minutes Intravenous Every 12 hours 03/08/24 1205 03/09/24  9364   03/06/24 0000  ceFEPIme  (MAXIPIME ) 2 g in sodium chloride  0.9 % 100 mL IVPB  Status:  Discontinued        2 g 200 mL/hr over 30 Minutes Intravenous Every 8 hours 03/05/24 2329 03/08/24 1205   03/05/24 0200  cefTRIAXone  (ROCEPHIN ) 2 g in sodium chloride  0.9 % 100 mL IVPB  Status:  Discontinued        2 g 200 mL/hr over 30 Minutes Intravenous Every 24 hours 03/05/24 0050 03/05/24 2330   03/01/24  1000  doxycycline  (VIBRAMYCIN ) 100 mg in sodium chloride  0.9 % 250 mL IVPB  Status:  Discontinued        100 mg 125 mL/hr over 120 Minutes Intravenous Every 12 hours 03/01/24 0826 03/01/24 1003   03/01/24 0200  cefTRIAXone  (ROCEPHIN ) 1 g in sodium chloride  0.9 % 100 mL IVPB  Status:  Discontinued        1 g 200 mL/hr over 30 Minutes Intravenous Every 24 hours 03/01/24 0041 03/05/24 0050       Significant Hospital Events: Including procedures, antibiotic start and stop dates in addition to other pertinent events   1/16: Admitted to ICU for asthma/COPD overlap exacerbation with hypercapnic respiratory failure requiring emergent intubation. 03/01/24- patient failed SBT today, remains on mV. She has AKI.   Strep pneumoniae +, will continue Rocephin .  Husband at bedside I met with him and we reviewed medical plan.  03/02/24- patient had resp distress overnight.  She still has AKI but there is an interval improvement.  03/03/24- patient for SBT today.  AKI has improved this am.  She requires heavy sedation. She has family coming in this afternoon who wishes to be present for SBT. 03/04/24- patient passed SBT and was extubated but after 1 hour started to have respiratory distress with severe hypoxemia. Her daughter was present and requested re-intubation. We continue to treat her underlying Asthma COPD exacerbation with strep pneumoniae respiratory infection.  03/05/24- Patient failed SBT today with tachypnea/tachycardia/resp distress 03/06/24- Patient failed SBT and husband was present during evaluation, she was tachypneic and tachycardic. 03/07/24- Asynchronous with vent, with obstructive process/bronchospasm.  Steroids increased to 80 mg BID, Ketamine  gtt started, place A-line for frequent ABG's. 03/08/24 - paralyzed with worsening hypercapnia. Not a candidate for ECMO. Ketamine  increased to 3 mg/hour.  03/10/24- Overnight pt became hypoxic with O2 sats decreasing to 60% requiring 10 mg of iv vecuronium  x1  dose.  Due to inability to liberate from ventilator ENT consulted for possible tracheostomy placement.  CTA Chest obtained which was negative for acute PE or aortic dissection but suspicious for multifocal pneumonia  03/11/24: Remains mechanically intubated.  Overnight required vecuronium  x1 dose due to vent dyssynchrony.  Husband at bedside currently performing WUA and SBT if she fails will need tracheostomy which is scheduled for 01/28   Interim History / Subjective:  As outlined above under Significant Hospital Events section  Objective   Blood pressure 129/78, pulse 85, temperature 99 F (37.2 C), resp. rate (!) 24, height 5' 6 (1.676 m), weight 74.5 kg, SpO2 95%.    Vent Mode: PSV FiO2 (%):  [30 %-40 %] 40 % Set Rate:  [24 bmp] 24 bmp Vt Set:  [420 mL] 420 mL PEEP:  [5 cmH20] 5 cmH20 Plateau Pressure:  [16 cmH20-22 cmH20] 22 cmH20   Intake/Output Summary (Last 24 hours) at 03/11/2024 1033 Last data filed at 03/11/2024 0901 Gross per 24 hour  Intake 2684.21 ml  Output 1530 ml  Net 1154.21  ml   Filed Weights   03/09/24 0407 03/10/24 0420  Weight: 74.5 kg 74.5 kg    Examination: General: Critically ill-appearing female, lying in bed, intubated and sedated HENT: Atraumatic, normocephalic, neck supple, no JVD, orally intubated Lungs: Expiratory expiratory wheezing throughout, even, non labored, synchronous  Cardiovascular: Regular rate and rhythm, S1-S2, no murmurs, rubs, gallops Abdomen: +BS xr4, Soft, nontender, nondistended, no guarding or tenderness Extremities: Normal bulk and tone, no deformities, no edema Neuro: Sedated, withdraws from pain, currently not following commands, PERRLA GU: Foley catheter in place  Resolved Hospital Problem list     Assessment & Plan:   #Acute Metabolic Encephalopathy #Sedation needs in setting of mechanical ventilation PMHx: Seizure disorder, anxiety, depression, bipolar disorder, polysubstance abuse  CT Head 01/26: Normal head CT.  No acute intracranial abnormality.  - Maintain a RASS goal of --2 - PAD protocol to maintain RASS goal: propofol  and dilaudid  gtts prn - Avoid sedating medications as able - Attempting to perform WUA today  - Continue home Lamictal  100 mg daily  #Acute Hypoxic & Hypercapnic Respiratory Failure due to ... #Strep Pneumoniae Pneumonia #COPD/Asthma Exacerbation  PMHx: Tobacco use  CTA Chest 03/10/24:  Negative for acute pulmonary embolus or aortic dissection. Bilateral lower lobe partial consolidations with diffuse bilateral peribronchovascular mild ground-glass disease and small nodules, findings are suspicious for multifocal pneumonia. Imaging follow-up to resolution is recommended Multi vessel coronary vascular calcification.  Continues to auto-PEEP on vent, intermittent low Vt - Full vent support, implement lung protective strategies - Plateau pressures less than 30 cm H20 - Wean FiO2 & PEEP as tolerated to maintain O2 sats 88 to 92% - Follow intermittent Chest X-ray & ABG as needed - Spontaneous Breathing Trials when respiratory parameters met and mental status permits - Implement VAP Bundle - Scheduled and prn bronchodilator therapy  - IV Steroids (increase to 80 mg BID on 1/23) - If unable to pass SBT will require tracheostomy placement which is tentatively scheduled for 01/28  #Sepsis  #Strep Pneumoniae Pneumonia #Leukocytosis - Trend WBC's and monitor fever curve  - Follow cultures as above - Completed 7 day course of cefepime    #Anemia without signs of active bleeding  - Trend CBC  - Monitor for s/sx of bleeding  - Transfuse for hgb <7  #Hyperglycemia - CBG's q4h; Target range of 140 to 180 - SSI - Follow ICU Hypo/Hyperglycemia protocol  Best Practice (right click and Reselect all SmartList Selections daily)   Diet/type: tubefeeds and NPO DVT prophylaxis: LMWH GI prophylaxis: PPI Lines: Left radial arterial line  Foley:  Yes, and it is still needed Code Status:   full code Last date of multidisciplinary goals of care discussion [03/11/24]  1/27: Pts spouse updated at bedside by Dr. Isadora regarding pts condition and current plan of care  Labs   CBC: Recent Labs  Lab 03/07/24 0413 03/08/24 0504 03/09/24 0321 03/10/24 1108 03/11/24 0404  WBC 12.1* 13.9* 10.2 16.8* 17.9*  NEUTROABS  --   --   --  14.7*  --   HGB 10.9* 10.8* 9.3* 10.4* 9.3*  HCT 36.6 36.0 30.5* 33.0* 30.0*  MCV 98.1 99.4 96.5 92.7 96.2  PLT 272 302 267 330 289    Basic Metabolic Panel: Recent Labs  Lab 03/07/24 0413 03/08/24 0504 03/08/24 1716 03/09/24 0321 03/10/24 0159 03/11/24 0404  NA 141 142  --  142 144 144  K 5.0 5.4* 5.0 5.1 4.7 4.8  CL 104 106  --  108 110 110  CO2 29 28  --  26 27 27   GLUCOSE 131* 170*  --  148* 116* 125*  BUN 28* 35*  --  26* 30* 35*  CREATININE 0.87 1.15*  --  0.79 0.76 0.70  CALCIUM  9.9 9.4  --  9.2 8.9 9.5  MG 2.2 3.2*  --  3.0* 2.4 2.1  PHOS 3.5 5.4*  --  2.6 2.3* 3.1   GFR: Estimated Creatinine Clearance: 79.1 mL/min (by C-G formula based on SCr of 0.7 mg/dL). Recent Labs  Lab 03/05/24 0420 03/06/24 0225 03/07/24 0413 03/08/24 0504 03/09/24 0321 03/10/24 1108 03/11/24 0404  PROCALCITON <0.10 <0.10  --   --   --   --   --   WBC 10.3 10.5   < > 13.9* 10.2 16.8* 17.9*   < > = values in this interval not displayed.    Liver Function Tests: Recent Labs  Lab 03/07/24 0410  AST 26  ALT 28  ALKPHOS 57  BILITOT <0.2  PROT 6.2*  ALBUMIN 3.5   No results for input(s): LIPASE, AMYLASE in the last 168 hours. No results for input(s): AMMONIA in the last 168 hours.  ABG    Component Value Date/Time   PHART 7.39 03/11/2024 0246   PCO2ART 52 (H) 03/11/2024 0246   PO2ART 99 03/11/2024 0246   HCO3 31.5 (H) 03/11/2024 0246   ACIDBASEDEF 0.3 03/09/2024 1644   O2SAT 97.5 03/11/2024 0246     Coagulation Profile: No results for input(s): INR, PROTIME in the last 168 hours.  Cardiac Enzymes: Recent Labs  Lab  03/09/24 1644  CKTOTAL 93    HbA1C: Hemoglobin A1C  Date/Time Value Ref Range Status  09/21/2021 02:00 PM 6.4 (A) 4.0 - 5.6 % Final  03/22/2021 09:52 AM 6.2 (A) 4.0 - 5.6 % Final  01/15/2014 12:00 AM 6.8  Final   HbA1c, POC (prediabetic range)  Date/Time Value Ref Range Status  05/11/2020 10:30 AM 0 (A) 5.7 - 6.4 % Final   HbA1c, POC (controlled diabetic range)  Date/Time Value Ref Range Status  05/11/2020 10:30 AM 0.0 0.0 - 7.0 % Final   HbA1c POC (<> result, manual entry)  Date/Time Value Ref Range Status  05/11/2020 10:30 AM 6.5 4.0 - 5.6 % Final   Hgb A1c MFr Bld  Date/Time Value Ref Range Status  03/01/2024 02:02 AM 6.4 (H) 4.8 - 5.6 % Final    Comment:    (NOTE) Diagnosis of Diabetes The following HbA1c ranges recommended by the American Diabetes Association (ADA) may be used as an aid in the diagnosis of diabetes mellitus.  Hemoglobin             Suggested A1C NGSP%              Diagnosis  <5.7                   Non Diabetic  5.7-6.4                Pre-Diabetic  >6.4                   Diabetic  <7.0                   Glycemic control for                       adults with diabetes.    12/24/2019 11:08 AM 6.7 (H) 4.8 - 5.6 % Final    Comment:  Prediabetes: 5.7 - 6.4          Diabetes: >6.4          Glycemic control for adults with diabetes: <7.0     CBG: Recent Labs  Lab 03/10/24 1235 03/10/24 1916 03/10/24 2310 03/11/24 0318 03/11/24 0723  GLUCAP 90 113* 111* 124* 73    Review of Systems:   Unable to assess due to intubation/sedation/critical illness   Past Medical History:  She,  has a past medical history of Allergy, Anxiety, Asthma, Bilateral carpal tunnel syndrome (12/31/2018), Bipolar affective, manic (HCC) (1996), COPD (chronic obstructive pulmonary disease) (HCC), Depression, HLD (hyperlipidemia) (03/12/2018), Lung nodule, multiple (09/26/2016), Manic depressive illness (HCC) (1996), Seizures (HCC), Substance abuse (HCC), and  Tobacco abuse (10/04/2016).   Surgical History:   Past Surgical History:  Procedure Laterality Date   ABDOMINAL HYSTERECTOMY     CARPAL TUNNEL RELEASE Right 01/15/2019   Procedure: RIGHTCARPAL TUNNEL RELEASE, LEFT DEQUERVAINS INJECTION;  Surgeon: Jerri Kay HERO, MD;  Location: [facility];  Service: Orthopedics;  Laterality: Right;   CARPAL TUNNEL RELEASE Left 02/26/2019   Procedure: LEFT CARPAL TUNNEL RELEASE;  Surgeon: Jerri Kay HERO, MD;  Location: [facility];  Service: Orthopedics;  Laterality: Left;  Bier block   COLONOSCOPY WITH PROPOFOL  N/A 11/28/2018   Procedure: COLONOSCOPY WITH PROPOFOL ;  Surgeon: Therisa Bi, MD;  Location: [facility];  Service: Gastroenterology;  Laterality: N/A;   TUBAL LIGATION       Social History:   reports that she has been smoking cigarettes. Her smokeless tobacco use includes chew and snuff. She reports that she does not currently use alcohol. She reports that she does not use drugs.   Family History:  Her family history includes Bipolar disorder in her brother and brother; CAD in her father; Heart attack in her father; Other in her mother; Schizophrenia in her brother and brother; Stroke in her mother.   Allergies Allergies[1]   Home Medications  Prior to Admission medications  Medication Sig Start Date End Date Taking? Authorizing Provider  albuterol  (PROVENTIL  HFA) 108 (90 Base) MCG/ACT inhaler Inhale 2 puffs into the lungs every 4-6 hours as needed. 09/21/21  Yes Iloabachie, Chioma E, NP  amphetamine-dextroamphetamine (ADDERALL XR) 10 MG 24 hr capsule Take 10 mg by mouth daily.   Yes [provider]  citalopram  (CELEXA ) 40 MG tablet Take 1 tablet (40 mg total) by mouth daily. 11/21/22  Yes   Fluticasone  Furoate (ARNUITY ELLIPTA ) 200 MCG/ACT AEPB Inhale 1 puff into the lungs daily. 12/06/23  Yes Dgayli, Belva, MD  hydrOXYzine  (ATARAX ) 50 MG tablet Take 1-1.5 tablets (50-75 mg total) by mouth at bedtime as needed  for sleep 11/21/22  Yes   lamoTRIgine  (LAMICTAL ) 100 MG tablet Take 1 tablet (100 mg total) by mouth daily. 11/21/22  Yes   rosuvastatin  (CRESTOR ) 10 MG tablet Take 10 mg by mouth at bedtime. 08/13/23  Yes [provider]  traMADol (ULTRAM) 50 MG tablet Take 1 tablet by mouth every 6 (six) hours as needed.   Yes [provider]  umeclidinium-vilanterol (ANORO ELLIPTA ) 62.5-25 MCG/ACT AEPB Inhale 1 puff into the lungs daily. 12/06/23  Yes Dgayli, Belva, MD  ipratropium-albuterol  (DUONEB) 0.5-2.5 (3) MG/3ML SOLN INHALE CONTENTS OF ONE VIAL ( TOTAL) USING NEBULIZER  ONCE EVERY 6 HOURS. 11/02/20 12/06/23  Iloabachie, Chioma E, NP  atomoxetine  (STRATTERA ) 25 MG capsule Take 1 capsule (25 mg total) by mouth once every evening after dinner. Patient not taking: Reported on 12/23/2020 10/26/20  03/09/21    loratadine  (CLARITIN ) 10 MG tablet Take 1 tablet (10 mg total) by mouth daily as needed for allergies. 12/16/19 12/25/19  Iloabachie, Chioma E, NP     Critical care time: 50 minutes     Lonell Moose, AGNP  Pulmonary/Critical Care Pager [phone] (please enter 7 digits) PCCM Consult Pager [phone] (please enter 7 digits)          [1]  Allergies Allergen Reactions   Flax Seed Oil [Flax Seed Oil] Hives    Pt. Self reported   Other Hives    Pt. Self reported   Trazodone Other (See Comments)    Bed wetting  Other Reaction(s): Other (See Comments)    Bed wetting   "

## 2024-03-12 ENCOUNTER — Encounter
Admission: EM | Payer: Self-pay | Source: Home / Self Care | Attending: Student in an Organized Health Care Education/Training Program

## 2024-03-12 ENCOUNTER — Inpatient Hospital Stay: Payer: Self-pay | Admitting: Certified Registered"

## 2024-03-12 ENCOUNTER — Inpatient Hospital Stay: Payer: MEDICAID

## 2024-03-12 LAB — CBC
HCT: 29.6 % — ABNORMAL LOW (ref 36.0–46.0)
Hemoglobin: 9.3 g/dL — ABNORMAL LOW (ref 12.0–15.0)
MCH: 29.3 pg (ref 26.0–34.0)
MCHC: 31.4 g/dL (ref 30.0–36.0)
MCV: 93.4 fL (ref 80.0–100.0)
Platelets: 284 10*3/uL (ref 150–400)
RBC: 3.17 MIL/uL — ABNORMAL LOW (ref 3.87–5.11)
RDW: 14.5 % (ref 11.5–15.5)
WBC: 17.4 10*3/uL — ABNORMAL HIGH (ref 4.0–10.5)
nRBC: 0 % (ref 0.0–0.2)

## 2024-03-12 LAB — BLOOD GAS, ARTERIAL
Acid-Base Excess: 5 mmol/L — ABNORMAL HIGH (ref 0.0–2.0)
Bicarbonate: 29.9 mmol/L — ABNORMAL HIGH (ref 20.0–28.0)
FIO2: 40 %
MECHVT: 420 mL
Mechanical Rate: 24
O2 Saturation: 99.2 %
PEEP: 5 cmH2O
Patient temperature: 37
pCO2 arterial: 44 mmHg (ref 32–48)
pH, Arterial: 7.44 (ref 7.35–7.45)
pO2, Arterial: 122 mmHg — ABNORMAL HIGH (ref 83–108)

## 2024-03-12 LAB — BASIC METABOLIC PANEL WITH GFR
Anion gap: 7 (ref 5–15)
BUN: 35 mg/dL — ABNORMAL HIGH (ref 6–20)
CO2: 27 mmol/L (ref 22–32)
Calcium: 9.4 mg/dL (ref 8.9–10.3)
Chloride: 111 mmol/L (ref 98–111)
Creatinine, Ser: 0.72 mg/dL (ref 0.44–1.00)
GFR, Estimated: >60 mL/min
Glucose, Bld: 123 mg/dL — ABNORMAL HIGH (ref 70–99)
Potassium: 4.6 mmol/L (ref 3.5–5.1)
Sodium: 144 mmol/L (ref 135–145)

## 2024-03-12 LAB — GLUCOSE, CAPILLARY
Glucose-Capillary: 120 mg/dL — ABNORMAL HIGH (ref 70–99)
Glucose-Capillary: 94 mg/dL (ref 70–99)
Glucose-Capillary: 112 mg/dL — ABNORMAL HIGH (ref 70–99)
Glucose-Capillary: 17 mg/dL — CL (ref 70–99)
Glucose-Capillary: 121 mg/dL — ABNORMAL HIGH (ref 70–99)
Glucose-Capillary: 124 mg/dL — ABNORMAL HIGH (ref 70–99)
Glucose-Capillary: 112 mg/dL — ABNORMAL HIGH (ref 70–99)
Glucose-Capillary: 100 mg/dL — ABNORMAL HIGH (ref 70–99)

## 2024-03-12 LAB — PHOSPHORUS: Phosphorus: 3.3 mg/dL (ref 2.5–4.6)

## 2024-03-12 LAB — TRIGLYCERIDES: Triglycerides: 210 mg/dL — ABNORMAL HIGH

## 2024-03-12 LAB — MAGNESIUM: Magnesium: 2.1 mg/dL (ref 1.7–2.4)

## 2024-03-12 MED ORDER — LIDOCAINE-EPINEPHRINE 1 %-1:100000 IJ SOLN
INTRAMUSCULAR | Status: DC | PRN
  Administered 2024-03-12: 5 mL

## 2024-03-12 MED ORDER — SODIUM CHLORIDE 0.9 % IV SOLN: INTRAVENOUS | Status: DC | PRN

## 2024-03-12 MED ORDER — 0.9 % SODIUM CHLORIDE (POUR BTL) OPTIME
TOPICAL | Status: DC | PRN
  Administered 2024-03-12: 500 mL

## 2024-03-12 MED ORDER — LIDOCAINE-EPINEPHRINE 1 %-1:100000 IJ SOLN
INTRAMUSCULAR | Status: AC
  Filled 2024-03-12: qty 20

## 2024-03-12 MED ORDER — FENTANYL CITRATE (PF) 100 MCG/2ML IJ SOLN
INTRAMUSCULAR | Status: AC
  Filled 2024-03-12: qty 2

## 2024-03-12 MED ORDER — FENTANYL CITRATE (PF) 100 MCG/2ML IJ SOLN
INTRAMUSCULAR | Status: DC | PRN
  Administered 2024-03-12: 100 ug via INTRAVENOUS

## 2024-03-12 MED ORDER — ROCURONIUM BROMIDE 100 MG/10ML IV SOLN
INTRAVENOUS | Status: DC | PRN
  Administered 2024-03-12: 30 mg via INTRAVENOUS
  Administered 2024-03-12: 20 mg via INTRAVENOUS
  Administered 2024-03-12: 30 mg via INTRAVENOUS

## 2024-03-12 MED ORDER — LABETALOL HCL 5 MG/ML IV SOLN
10.0000 mg | INTRAVENOUS | Status: DC | PRN
  Administered 2024-03-13 – 2024-03-17 (×13): 10 mg via INTRAVENOUS
  Filled 2024-03-12 (×12): qty 4

## 2024-03-12 MED ORDER — METHYLPREDNISOLONE SODIUM SUCC 40 MG IJ SOLR
40.0000 mg | Freq: Two times a day (BID) | INTRAMUSCULAR | Status: DC
  Administered 2024-03-12 – 2024-03-13 (×2): 40 mg via INTRAVENOUS
  Filled 2024-03-12 (×2): qty 1

## 2024-03-12 MED ORDER — PROPOFOL 10 MG/ML IV BOLUS
INTRAVENOUS | Status: AC
  Filled 2024-03-12: qty 20

## 2024-03-12 MED ORDER — IPRATROPIUM-ALBUTEROL 0.5-2.5 (3) MG/3ML IN SOLN
3.0000 mL | Freq: Three times a day (TID) | RESPIRATORY_TRACT | Status: DC
  Administered 2024-03-12 – 2024-03-13 (×4): 3 mL via RESPIRATORY_TRACT
  Filled 2024-03-12 (×6): qty 3

## 2024-03-12 MED ORDER — HYDRALAZINE HCL 20 MG/ML IJ SOLN
10.0000 mg | Freq: Four times a day (QID) | INTRAMUSCULAR | Status: AC | PRN
  Administered 2024-03-13: 20 mg via INTRAVENOUS
  Administered 2024-03-13: 10 mg via INTRAVENOUS
  Administered 2024-03-13 (×2): 20 mg via INTRAVENOUS
  Administered 2024-03-13: 10 mg via INTRAVENOUS
  Administered 2024-03-14 – 2024-03-19 (×6): 20 mg via INTRAVENOUS
  Filled 2024-03-12 (×11): qty 1

## 2024-03-12 MED ORDER — DEXMEDETOMIDINE HCL IN NACL 80 MCG/20ML IV SOLN
INTRAVENOUS | Status: DC | PRN
  Administered 2024-03-12: 12 ug via INTRAVENOUS

## 2024-03-12 NOTE — Plan of Care (Signed)
 This patient remains on [facility]ICU as of time of writing. The patient remains orally intubated and mechanically ventilated on PRVC. The patient is receiving active infusions of Propofol  and Dilaudid  via PIVs. The patient continues to have in place an ETT, Foley catheter, Arterial line, PIVs, OGT, and rectal temperature probe. The patient is receiving enteral nutrition via OGT. Overnight, the patient exhibited a poor ventilatory pattern and required intermittent chemical paralyzation for ventilator compliance again overnight tonight. The patient tolerated turns very poorly, exhibiting bradycardia into the 40 bpm range during turns. Unable to obtain the patient's AM weight due to a bed surface malfunction; patient is not stable for a transfer to another bed overnight. Plan for tracheostomy placement Wednesday 03/12/24.   Problem: Education: Goal: Knowledge of General Education information will improve Description: Including pain rating scale, medication(s)/side effects and non-pharmacologic comfort measures Outcome: Not Progressing   Problem: Health Behavior/Discharge Planning: Goal: Ability to manage health-related needs will improve Outcome: Not Progressing   Problem: Clinical Measurements: Goal: Ability to maintain clinical measurements within normal limits will improve Outcome: Not Progressing Goal: Will remain free from infection Outcome: Not Progressing Goal: Diagnostic test results will improve Outcome: Not Progressing Goal: Respiratory complications will improve Outcome: Not Progressing Goal: Cardiovascular complication will be avoided Outcome: Not Progressing   Problem: Activity: Goal: Risk for activity intolerance will decrease Outcome: Not Progressing   Problem: Nutrition: Goal: Adequate nutrition will be maintained Outcome: Not Progressing   Problem: Coping: Goal: Level of anxiety will decrease Outcome: Not Progressing   Problem: Elimination: Goal: Will not experience  complications related to bowel motility Outcome: Not Progressing Goal: Will not experience complications related to urinary retention Outcome: Not Progressing   Problem: Pain Managment: Goal: General experience of comfort will improve and/or be controlled Outcome: Not Progressing   Problem: Safety: Goal: Ability to remain free from injury will improve Outcome: Not Progressing   Problem: Skin Integrity: Goal: Risk for impaired skin integrity will decrease Outcome: Not Progressing   Problem: Education: Goal: Ability to describe self-care measures that may prevent or decrease complications (Diabetes Survival Skills Education) will improve Outcome: Not Progressing Goal: Individualized Educational Video(s) Outcome: Not Progressing   Problem: Coping: Goal: Ability to adjust to condition or change in health will improve Outcome: Not Progressing   Problem: Fluid Volume: Goal: Ability to maintain a balanced intake and output will improve Outcome: Not Progressing   Problem: Health Behavior/Discharge Planning: Goal: Ability to identify and utilize available resources and services will improve Outcome: Not Progressing Goal: Ability to manage health-related needs will improve Outcome: Not Progressing   Problem: Metabolic: Goal: Ability to maintain appropriate glucose levels will improve Outcome: Not Progressing   Problem: Nutritional: Goal: Maintenance of adequate nutrition will improve Outcome: Not Progressing Goal: Progress toward achieving an optimal weight will improve Outcome: Not Progressing   Problem: Skin Integrity: Goal: Risk for impaired skin integrity will decrease Outcome: Not Progressing   Problem: Tissue Perfusion: Goal: Adequacy of tissue perfusion will improve Outcome: Not Progressing   Problem: Activity: Goal: Ability to tolerate increased activity will improve Outcome: Not Progressing   Problem: Respiratory: Goal: Ability to maintain a clear airway and  adequate ventilation will improve Outcome: Not Progressing   Problem: Role Relationship: Goal: Method of communication will improve Outcome: Not Progressing   Problem: Activity: Goal: Ability to tolerate increased activity will improve Outcome: Not Progressing   Problem: Clinical Measurements: Goal: Ability to maintain a body temperature in the normal range will improve Outcome: Not Progressing  Problem: Respiratory: Goal: Ability to maintain adequate ventilation will improve Outcome: Not Progressing Goal: Ability to maintain a clear airway will improve Outcome: Not Progressing

## 2024-03-12 NOTE — Transfer of Care (Signed)
 Immediate Anesthesia Transfer of Care Note  Patient: Erica Gomez  Procedure(s) Performed: CREATION, TRACHEOSTOMY (Neck)  Patient Location: ICU  Anesthesia Type:General  Level of Consciousness: sedated  Airway & Oxygen Therapy: Patient placed on Ventilator (see vital sign flow sheet for setting)  Post-op Assessment: Report given to RN and Post -op Vital signs reviewed and stable  Post vital signs: Reviewed and stable  Last Vitals:  Vitals Value Taken Time  BP    Temp 36.6 C 03/12/24 12:58  Pulse 81 03/12/24 12:58  Resp 24 03/12/24 12:58  SpO2 94 % 03/12/24 12:58  Vitals shown include unfiled device data.  Last Pain:  Vitals:   03/12/24 0700  TempSrc: Rectal         Complications: No notable events documented.

## 2024-03-12 NOTE — Progress Notes (Addendum)
 Betadine dressing under sutured trach has been removed by respiratory therapy ENT provider, ICU provider and ICU change nurse have been notified.

## 2024-03-12 NOTE — Progress Notes (Signed)
 PHARMACY CONSULT NOTE - ELECTROLYTES  Pharmacy Consult for Electrolyte Monitoring and Replacement   Recent Labs: Height: 5' 6 (167.6 cm) Weight: 74.5 kg (164 lb 3.9 oz) IBW/kg (Calculated) : 59.3 Estimated Creatinine Clearance: 79.1 mL/min (by C-G formula based on SCr of 0.72 mg/dL).  Potassium (mmol/L)  Date Value  03/12/2024 4.6  11/12/2013 4.1   Magnesium  (mg/dL)  Date Value  98/71/7973 2.1   Calcium  (mg/dL)  Date Value  98/71/7973 9.4   Calcium , Total (mg/dL)  Date Value  90/69/7984 9.0   Albumin (g/dL)  Date Value  98/76/7973 3.5  05/12/2020 4.5  11/12/2013 3.5   Phosphorus (mg/dL)  Date Value  98/71/7973 3.3   Sodium (mmol/L)  Date Value  03/12/2024 144  12/06/2022 139  11/12/2013 141   Assessment  Erica Gomez is a 59 y.o. female presenting with respiratory failure. PMH significant for COPD, asthma, CAD, seizures, and bipolar disorder. Pt has been hyperkalemic up to 6.1 on 1/17. Pharmacy has been consulted to monitor and replace electrolytes.  Diet: Tube feeds 34mL/hr + free water  30mL Q4H  MIVF: NS @ 17mL/hr  Goal of Therapy: Electrolytes WNL  Plan:  --No electrolyte replacement indicated today --Follow-up electrolytes with AM labs tomorrow  Thank you for allowing pharmacy to be a part of this patient's care.  [name] 03/12/2024 7:49 AM

## 2024-03-12 NOTE — Plan of Care (Signed)
" °  Problem: Education: Goal: Knowledge of General Education information will improve Description: Including pain rating scale, medication(s)/side effects and non-pharmacologic comfort measures Outcome: Progressing   Problem: Health Behavior/Discharge Planning: Goal: Ability to manage health-related needs will improve Outcome: Progressing   Problem: Clinical Measurements: Goal: Ability to maintain clinical measurements within normal limits will improve Outcome: Progressing Goal: Will remain free from infection Outcome: Progressing Goal: Diagnostic test results will improve Outcome: Progressing Goal: Respiratory complications will improve Outcome: Progressing Goal: Cardiovascular complication will be avoided Outcome: Progressing   Problem: Activity: Goal: Risk for activity intolerance will decrease Outcome: Progressing   Problem: Nutrition: Goal: Adequate nutrition will be maintained Outcome: Progressing   Problem: Coping: Goal: Level of anxiety will decrease Outcome: Progressing   Problem: Elimination: Goal: Will not experience complications related to bowel motility Outcome: Progressing Goal: Will not experience complications related to urinary retention Outcome: Progressing   Problem: Pain Managment: Goal: General experience of comfort will improve and/or be controlled Outcome: Progressing   Problem: Safety: Goal: Ability to remain free from injury will improve Outcome: Progressing   Problem: Skin Integrity: Goal: Risk for impaired skin integrity will decrease Outcome: Progressing   Problem: Education: Goal: Ability to describe self-care measures that may prevent or decrease complications (Diabetes Survival Skills Education) will improve Outcome: Progressing Goal: Individualized Educational Video(s) Outcome: Progressing   Problem: Coping: Goal: Ability to adjust to condition or change in health will improve Outcome: Progressing   Problem: Fluid  Volume: Goal: Ability to maintain a balanced intake and output will improve Outcome: Progressing   Problem: Health Behavior/Discharge Planning: Goal: Ability to identify and utilize available resources and services will improve Outcome: Progressing Goal: Ability to manage health-related needs will improve Outcome: Progressing   Problem: Metabolic: Goal: Ability to maintain appropriate glucose levels will improve Outcome: Progressing   Problem: Nutritional: Goal: Maintenance of adequate nutrition will improve Outcome: Progressing Goal: Progress toward achieving an optimal weight will improve Outcome: Progressing   Problem: Skin Integrity: Goal: Risk for impaired skin integrity will decrease Outcome: Progressing   Problem: Tissue Perfusion: Goal: Adequacy of tissue perfusion will improve Outcome: Progressing   Problem: Activity: Goal: Ability to tolerate increased activity will improve Outcome: Progressing   Problem: Respiratory: Goal: Ability to maintain a clear airway and adequate ventilation will improve Outcome: Progressing   Problem: Role Relationship: Goal: Method of communication will improve Outcome: Progressing   Problem: Activity: Goal: Ability to tolerate increased activity will improve Outcome: Progressing   Problem: Clinical Measurements: Goal: Ability to maintain a body temperature in the normal range will improve Outcome: Progressing   Problem: Respiratory: Goal: Ability to maintain adequate ventilation will improve Outcome: Progressing Goal: Ability to maintain a clear airway will improve Outcome: Progressing   "

## 2024-03-12 NOTE — Op Note (Signed)
..  03/12/2024 12:40 PM  Erica Gomez [id number]  Pre-Op Dx: Respiratory failure with need for long term ventilatory support, multifocal pneumonia  Post-Op Dx:  same  Proc:  Tracheostomy  Surg:  Carolee Hunter  Assistant:  Glendia Dolly  Anes:  GOT  EBL:  <56ml  Comp:  none  Findings:  Size 6 shiley cuffed tracheostomy tube placed between tracheal rings 2 and 3.  Procedure:  The patient was brought from the intensive care unit to the operating room and transferred to an operating table.  Anesthesia was administered per indwelling orotracheal tube.   Neck extension was achieved as possible anda shoulder rolke was placed.  The lower neck was palpated with the findings as described above.  1% Xylocaine  with 1:100,000 epinephrine , 5 cc's, was infiltrated into the surgical field for intraoperative hemostasis.  Several minutes were allowed for this to take effect. The patient was prepped in a sterile fashion with a surgical prep from the chin down to the upper chest.  Sterile draping was accomplished in the standard fashion.  A  4 cm horizontal incision was made sharply a finger's breadth above the sternal notch, and extended through skin and subcutaneous fat.  Using cautery, the superficial layer of the deep cervical fascia was lysed.  Additional dissection revealed the strap muscles.  The midline raphe was divided in two layers and the muscles retracted laterally.  The pretracheal plane was visualized.  This was entered bluntly.  The thyroid  isthmus was isolated and divided with the Harmonic scalpel.  The thyroid  gland was retracted to either side.  The anterior face of the trachea was cleared.  In the  2nd-3rd interspace, a transverse incision was made between cartilage rings into the tracheal lumen.  A 6 mm wide inferiorly based flap was generated and secured to the lower wound with a 4-0 vicryl suture.   A previously tested  # 6 Shiley cuffed tracheostomy tube was brought into the field.   With the endotracheal tube under direct visualization through the tracheostomy, it was gently backed up.  The tracheostomy tube was inserted into the tracheal lumen.  Hemostasis was observed. The cuff was inflated and observed to be intact and containing pressure. The inner cannula was placed and ventilation assumed per tracheostomy tube.  Good chest wall motion was observed, and CO2 was documented per anesthesia.  The trach tube was secured in the standard fashion with trach ties. A 2-0 Nylon suture was used to secure the trach tube to the skin on both sides.  Hemostasis was observed again.  When satisfactory ventilation was assured, the orotracheal tube was removed.  At this point the procedure was completed.  The patient was returned to anesthesia, awakened as possible, and transferred back to the intensive care unit in stable condition.  Comment: 59 y.o. female with prolonged ventilation was the indication for today's procedure.  Anticipate a routine postoperative recovery including standard tracheal hygiene.  The sutures should be removed in 5 days.  When the patient no longer requires ventilator or pressure support, the cuff should be deflated.  Changing to an uncuffed tube and downsizing will be according to the clinical condition of the patient.   [name]  12:40 PM 03/12/2024

## 2024-03-12 NOTE — Anesthesia Preprocedure Evaluation (Signed)
"                                    Anesthesia Evaluation  Patient identified by MRN, date of birth, ID band Patient unresponsive  General Assessment Comment:Intubated and sedated  Reviewed: Allergy & Precautions, NPO status , Patient's Chart, lab work & pertinent test results  History of Anesthesia Complications Negative for: history of anesthetic complications  Airway Mallampati: Intubated       Dental   Pulmonary asthma , pneumonia, COPD, Current Smoker and Patient abstained from smoking.    + wheezing      Cardiovascular + CAD   Rhythm:Regular Rate:Normal  ECG 03/10/24:  Sinus tachycardia  RBBB and LAFB  Probable left ventricular hypertrophy 12 Lead; Mason-Likar   Neuro/Psych Seizures -,  PSYCHIATRIC DISORDERS Anxiety Depression Bipolar Disorder      GI/Hepatic negative GI ROS,,,  Endo/Other  diabetes, Type 2    Renal/GU negative Renal ROS     Musculoskeletal   Abdominal   Peds  Hematology  (+) Blood dyscrasia, anemia   Anesthesia Other Findings   Reproductive/Obstetrics                              Anesthesia Physical Anesthesia Plan  ASA: 3  Anesthesia Plan: General   Post-op Pain Management:    Induction: Intravenous  PONV Risk Score and Plan: 2 and Ondansetron , Dexamethasone and Treatment may vary due to age or medical condition  Airway Management Planned: Oral ETT  Additional Equipment:   Intra-op Plan:   Post-operative Plan: Post-operative intubation/ventilation  Informed Consent: I have reviewed the patients History and Physical, chart, labs and discussed the procedure including the risks, benefits and alternatives for the proposed anesthesia with the patient or authorized representative who has indicated his/her understanding and acceptance.     Dental advisory given and Consent reviewed with POA  Plan Discussed with: CRNA  Anesthesia Plan Comments: (History and consent obtained via phone from  patient's husband Vendetta Pittinger.  Patient's husband consented for risks of anesthesia including but not limited to:  - adverse reactions to medications - damage to eyes, teeth, lips or other oral mucosa - nerve damage due to positioning  - sore throat or hoarseness - damage to heart, brain, nerves, lungs, other parts of body or loss of life  Patient's husband voiced understanding.)         Anesthesia Quick Evaluation  "

## 2024-03-12 NOTE — Progress Notes (Signed)
 "  NAME:  Erica Gomez, MRN:  [id number], DOB:  [birth year], LOS: 12 ADMISSION DATE:  02/29/2024, CONSULTATION DATE:  02/29/24 REFERRING MD:  Dr. Willo, CHIEF COMPLAINT:  Acute Respiratory Distress    Brief Pt Description / Synopsis:  59 y.o. female admitted with Acute Hypoxic and Hypercapnic Respiratory Failure due to Strep Pneumoniae Pneumonia, severe COPD/Asthma Exacerbation requiring intubation and mechanical ventilation.  With failure to wean from vent, ultimately requiring Tracheostomy placement on 03/12/24.  History of Present Illness:  59 y/o F with PMH sig for asthma, COPD, tobacco use disorder, bipolar disorder, seizure, polysubstance abuse, anxiety/depression, and HLD presented to ED BIB [facility] from home for acute respiratory distress.   On Chart review, patient follows with pulmonologist; last visit with Dr. Isadora on 10/25. Meds prescribed at visit included fluticasone  furoate (Arnuity Ellipta ) 200 mcg 1 puff daily and umeclidinium-vilanterol (Anoro Ellipta ) 62.5/25 mcg 1 puff daily. Per husband, patient has been taking medications as prescribed. EMS tx included MgSO? 2 g IV, Solu-Medrol  125 mg IV, DuoNeb x1, and albuterol  x1. During transport, pt became combative/confused and received Haldol 4 mg IM and Versed  5 mg IM. Initially A&O with EMS. SpO? 94% on RA.   ED Course: Initial VS: BP 211/120, HR 142, SpO? 94% on RA. Patient was unresponsive with labored/heaving respirations and in severe resp distress. Concern for hypercapnic resp failure contributing to agitation and AMS. Pt subsequently lost ability to protect airway. Given worsening MS and resp failure, decision made for emergent intubation. Pt received ketamine  and rocuronium  and was intubated successfully without complication.   Labs/Imaging: Labs: WBC 10.7, Hgb 12.7, Hct 40.9, Plt 313. BMP: Na 141, K 4.9, Cl 106, CO? 25, BUN 14, Cr 0.81, Glu 147, Ca 8.9. LFTs WNL: AST 23, ALT 20, Alk Phos 90, Tbili <0.2, Alb 4.6. VBG c/w resp  acidosis/hypercapnia (HCO? 29.8), O? sat 98.3. Lactate 1.7. CXR: no acute cardiopulm process; ETT in mid trachea, enteric tube in stomach. CT head ordered and pending for AMS. PCCM asked to admit for further workup and treatment.  Please see Significant Hospital Events section below for full detailed hospital course.   Pertinent  Medical History   Past Medical History:  Diagnosis Date   Allergy    Anxiety    Asthma    Bilateral carpal tunnel syndrome 12/31/2018   Bipolar affective, manic (HCC) 1996   COPD (chronic obstructive pulmonary disease) (HCC)    Depression    HLD (hyperlipidemia) 03/12/2018   Lung nodule, multiple 09/26/2016   Manic depressive illness (HCC) 1996   Seizures (HCC)    Substance abuse (HCC)    Tobacco abuse 10/04/2016    Micro Data:  02/29/24 SARS-CoV-2 PCR ? negative 02/29/24 Flu PCR ? negative 02/29/24 RVP ? negative 02/29/24 BCx2 ? no growth 02/29/24 MRSA PCR ? negative 02/29/24 Strep pneumo Ur Ag + 02/29/24 Legionella Ur Ag ?  negative 02/29/24: Respiratory viral panel>> negative 03/01/24: Tracheal aspirate>>normal flora 03/05/24: Repeat tracheal aspirate>>normal flora   Antimicrobials:   Anti-infectives (From admission, onward)    Start     Dose/Rate Route Frequency Ordered Stop   03/09/24 0800  ceFEPIme  (MAXIPIME ) 2 g in sodium chloride  0.9 % 100 mL IVPB        2 g 200 mL/hr over 30 Minutes Intravenous Every 8 hours 03/09/24 0635 03/11/24 0925   03/08/24 2015  ceFEPIme  (MAXIPIME ) 2 g in sodium chloride  0.9 % 100 mL IVPB  Status:  Discontinued        2 g  200 mL/hr over 30 Minutes Intravenous Every 12 hours 03/08/24 1205 03/09/24 0635   03/06/24 0000  ceFEPIme  (MAXIPIME ) 2 g in sodium chloride  0.9 % 100 mL IVPB  Status:  Discontinued        2 g 200 mL/hr over 30 Minutes Intravenous Every 8 hours 03/05/24 2329 03/08/24 1205   03/05/24 0200  cefTRIAXone  (ROCEPHIN ) 2 g in sodium chloride  0.9 % 100 mL IVPB  Status:  Discontinued        2 g 200  mL/hr over 30 Minutes Intravenous Every 24 hours 03/05/24 0050 03/05/24 2330   03/01/24 1000  doxycycline  (VIBRAMYCIN ) 100 mg in sodium chloride  0.9 % 250 mL IVPB  Status:  Discontinued        100 mg 125 mL/hr over 120 Minutes Intravenous Every 12 hours 03/01/24 0826 03/01/24 1003   03/01/24 0200  cefTRIAXone  (ROCEPHIN ) 1 g in sodium chloride  0.9 % 100 mL IVPB  Status:  Discontinued        1 g 200 mL/hr over 30 Minutes Intravenous Every 24 hours 03/01/24 0041 03/05/24 0050       Significant Hospital Events: Including procedures, antibiotic start and stop dates in addition to other pertinent events   1/16: Admitted to ICU for asthma/COPD overlap exacerbation with hypercapnic respiratory failure requiring emergent intubation. 03/01/24- patient failed SBT today, remains on mV. She has AKI.   Strep pneumoniae +, will continue Rocephin .  Husband at bedside I met with him and we reviewed medical plan.  03/02/24- patient had resp distress overnight.  She still has AKI but there is an interval improvement.  03/03/24- patient for SBT today.  AKI has improved this am.  She requires heavy sedation. She has family coming in this afternoon who wishes to be present for SBT. 03/04/24- patient passed SBT and was extubated but after 1 hour started to have respiratory distress with severe hypoxemia. Her daughter was present and requested re-intubation. We continue to treat her underlying Asthma COPD exacerbation with strep pneumoniae respiratory infection.  03/05/24- Patient failed SBT today with tachypnea/tachycardia/resp distress 03/06/24- Patient failed SBT and husband was present during evaluation, she was tachypneic and tachycardic. 03/07/24- Asynchronous with vent, with obstructive process/bronchospasm.  Steroids increased to 80 mg BID, Ketamine  gtt started, place A-line for frequent ABG's. 03/08/24 - paralyzed with worsening hypercapnia. Not a candidate for ECMO. Ketamine  increased to 3 mg/hour.  03/10/24-  Overnight pt became hypoxic with O2 sats decreasing to 60% requiring 10 mg of iv vecuronium  x1 dose.  Due to inability to liberate from ventilator ENT consulted for possible tracheostomy placement.  CTA Chest obtained which was negative for acute PE or aortic dissection but suspicious for multifocal pneumonia  03/11/24: Remains mechanically intubated.  Overnight required vecuronium  x1 dose due to vent dyssynchrony.  Husband at bedside currently performing WUA and SBT if she fails will need tracheostomy which is scheduled for 01/28  03/12/24:  No acute events overnight, hemodynamically stable, no vasopressors. Tracheostomy performed by ENT.  Interim History / Subjective:  As outlined above under Significant Hospital Events section  Objective   Blood pressure (!) 170/80, pulse 74, temperature 98.2 F (36.8 C), temperature source Rectal, resp. rate (!) 24, height 5' 6 (1.676 m), weight 74.5 kg, SpO2 100%.    Vent Mode: PRVC FiO2 (%):  [40 %] 40 % Set Rate:  [24 bmp] 24 bmp Vt Set:  [420 mL] 420 mL PEEP:  [5 cmH20] 5 cmH20 Pressure Support:  [10 cmH20] 10 cmH20 Plateau Pressure:  [  19 cmH20-22 cmH20] 19 cmH20   Intake/Output Summary (Last 24 hours) at 03/12/2024 9196 Last data filed at 03/12/2024 0700 Gross per 24 hour  Intake 1232.45 ml  Output 1725 ml  Net -492.55 ml   Filed Weights   03/09/24 0407 03/10/24 0420  Weight: 74.5 kg 74.5 kg    Examination: General: Critically ill-appearing female, lying in bed, intubated and sedated HENT: Atraumatic, normocephalic, neck supple, no JVD, orally intubated Lungs: Faint Expiratory expiratory wheezing throughout, even, non labored, synchronous  Cardiovascular: Regular rate and rhythm, S1-S2, no murmurs, rubs, gallops Abdomen: +BS xr4, Soft, nontender, nondistended, no guarding or tenderness Extremities: Normal bulk and tone, no deformities, no edema Neuro: Sedated, withdraws from pain, currently not following commands, PERRLA GU: Foley  catheter in place  Resolved Hospital Problem list     Assessment & Plan:   #Acute Metabolic Encephalopathy #Sedation needs in setting of mechanical ventilation PMHx: Seizure disorder, anxiety, depression, bipolar disorder, polysubstance abuse  CT Head 01/26: Normal head CT. No acute intracranial abnormality.  -Maintain a RASS goal of -1 to -2 -Dilaudid  and Propofol  to maintain RASS goal -Avoid sedating medications as able -Daily wake up assessment -Continue home Lamictal  100 mg daily  #Acute Hypoxic & Hypercapnic Respiratory Failure due to ... #Strep Pneumoniae Pneumonia #COPD/Asthma Exacerbation  PMHx: Tobacco use  CTA Chest 03/10/24:  Negative for acute pulmonary embolus or aortic dissection. Bilateral lower lobe partial consolidations with diffuse bilateral peribronchovascular mild ground-glass disease and small nodules, findings are suspicious for multifocal pneumonia. Imaging follow-up to resolution is recommended Multi vessel coronary vascular calcification. - Status post Tracheostomy 03/12/24 - Full vent support, implement lung protective strategies - Plateau pressures less than 30 cm H20 - Wean FiO2 & PEEP as tolerated to maintain O2 sats 88 to 92% - Follow intermittent Chest X-ray & ABG as needed - Spontaneous Breathing Trials when respiratory parameters met and mental status permits - Implement VAP Bundle - Scheduled and prn bronchodilator therapy  - IV Steroids (increase to 80 mg BID on 1/23) - Plan for Tracheostomy 1/28  #Sepsis ~ RESOLVED #Strep Pneumoniae Pneumonia ~ TREATED  #Leukocytosis - Trend WBC's and monitor fever curve  - Follow cultures as above - Completed 7 day course of Ceftriaxone /cefepime    #Anemia without signs of active bleeding  - Trend CBC  - Monitor for s/sx of bleeding  - Transfuse for hgb <7  #Hyperglycemia - CBG's q4h; Target range of 140 to 180 - SSI - Follow ICU Hypo/Hyperglycemia protocol   Best Practice (right click and  Reselect all SmartList Selections daily)   Diet/type: tubefeeds and NPO DVT prophylaxis: SCD's ([facility] on hold for Trach 1/28) GI prophylaxis: PPI Lines: Left radial arterial line  Foley:  Yes, and it is still needed Code Status:  full code Last date of multidisciplinary goals of care discussion [03/12/24]  1/28: Pts spouse updated at bedside by Dr. Isadora regarding pts condition and current plan of care  Labs   CBC: Recent Labs  Lab 03/08/24 0504 03/09/24 0321 03/10/24 1108 03/11/24 0404 03/12/24 0419  WBC 13.9* 10.2 16.8* 17.9* 17.4*  NEUTROABS  --   --  14.7*  --   --   HGB 10.8* 9.3* 10.4* 9.3* 9.3*  HCT 36.0 30.5* 33.0* 30.0* 29.6*  MCV 99.4 96.5 92.7 96.2 93.4  PLT 302 267 330 289 284    Basic Metabolic Panel: Recent Labs  Lab 03/08/24 0504 03/08/24 1716 03/09/24 0321 03/10/24 0159 03/11/24 0404 03/12/24 0419  NA 142  --  142 144 144 144  K 5.4* 5.0 5.1 4.7 4.8 4.6  CL 106  --  108 110 110 111  CO2 28  --  26 27 27 27   GLUCOSE 170*  --  148* 116* 125* 123*  BUN 35*  --  26* 30* 35* 35*  CREATININE 1.15*  --  0.79 0.76 0.70 0.72  CALCIUM  9.4  --  9.2 8.9 9.5 9.4  MG 3.2*  --  3.0* 2.4 2.1 2.1  PHOS 5.4*  --  2.6 2.3* 3.1 3.3   GFR: Estimated Creatinine Clearance: 79.1 mL/min (by C-G formula based on SCr of 0.72 mg/dL). Recent Labs  Lab 03/06/24 0225 03/07/24 0413 03/09/24 0321 03/10/24 1108 03/11/24 0404 03/12/24 0419  PROCALCITON <0.10  --   --   --   --   --   WBC 10.5   < > 10.2 16.8* 17.9* 17.4*   < > = values in this interval not displayed.    Liver Function Tests: Recent Labs  Lab 03/07/24 0410  AST 26  ALT 28  ALKPHOS 57  BILITOT <0.2  PROT 6.2*  ALBUMIN 3.5   No results for input(s): LIPASE, AMYLASE in the last 168 hours. No results for input(s): AMMONIA in the last 168 hours.  ABG    Component Value Date/Time   PHART 7.44 03/12/2024 0500   PCO2ART 44 03/12/2024 0500   PO2ART 122 (H) 03/12/2024 0500   HCO3 29.9 (H)  03/12/2024 0500   ACIDBASEDEF 0.3 03/09/2024 1644   O2SAT 99.2 03/12/2024 0500     Coagulation Profile: No results for input(s): INR, PROTIME in the last 168 hours.  Cardiac Enzymes: Recent Labs  Lab 03/09/24 1644  CKTOTAL 93    HbA1C: Hemoglobin A1C  Date/Time Value Ref Range Status  09/21/2021 02:00 PM 6.4 (A) 4.0 - 5.6 % Final  03/22/2021 09:52 AM 6.2 (A) 4.0 - 5.6 % Final  01/15/2014 12:00 AM 6.8  Final   HbA1c, POC (prediabetic range)  Date/Time Value Ref Range Status  05/11/2020 10:30 AM 0 (A) 5.7 - 6.4 % Final   HbA1c, POC (controlled diabetic range)  Date/Time Value Ref Range Status  05/11/2020 10:30 AM 0.0 0.0 - 7.0 % Final   HbA1c POC (<> result, manual entry)  Date/Time Value Ref Range Status  05/11/2020 10:30 AM 6.5 4.0 - 5.6 % Final   Hgb A1c MFr Bld  Date/Time Value Ref Range Status  03/01/2024 02:02 AM 6.4 (H) 4.8 - 5.6 % Final    Comment:    (NOTE) Diagnosis of Diabetes The following HbA1c ranges recommended by the American Diabetes Association (ADA) may be used as an aid in the diagnosis of diabetes mellitus.  Hemoglobin             Suggested A1C NGSP%              Diagnosis  <5.7                   Non Diabetic  5.7-6.4                Pre-Diabetic  >6.4                   Diabetic  <7.0                   Glycemic control for                       adults  with diabetes.    12/24/2019 11:08 AM 6.7 (H) 4.8 - 5.6 % Final    Comment:             Prediabetes: 5.7 - 6.4          Diabetes: >6.4          Glycemic control for adults with diabetes: <7.0     CBG: Recent Labs  Lab 03/11/24 1137 03/11/24 1613 03/11/24 1929 03/11/24 2317 03/12/24 0329  GLUCAP 118* 109* 103* 97 120*    Review of Systems:   Unable to assess due to intubation/sedation/critical illness   Past Medical History:  She,  has a past medical history of Allergy, Anxiety, Asthma, Bilateral carpal tunnel syndrome (12/31/2018), Bipolar affective, manic (HCC)  (1996), COPD (chronic obstructive pulmonary disease) (HCC), Depression, HLD (hyperlipidemia) (03/12/2018), Lung nodule, multiple (09/26/2016), Manic depressive illness (HCC) (1996), Seizures (HCC), Substance abuse (HCC), and Tobacco abuse (10/04/2016).   Surgical History:   Past Surgical History:  Procedure Laterality Date   ABDOMINAL HYSTERECTOMY     CARPAL TUNNEL RELEASE Right 01/15/2019   Procedure: RIGHTCARPAL TUNNEL RELEASE, LEFT DEQUERVAINS INJECTION;  Surgeon: Jerri Kay HERO, MD;  Location: [facility];  Service: Orthopedics;  Laterality: Right;   CARPAL TUNNEL RELEASE Left 02/26/2019   Procedure: LEFT CARPAL TUNNEL RELEASE;  Surgeon: Jerri Kay HERO, MD;  Location:  SURGERY CENTER;  Service: Orthopedics;  Laterality: Left;  Bier block   COLONOSCOPY WITH PROPOFOL  N/A 11/28/2018   Procedure: COLONOSCOPY WITH PROPOFOL ;  Surgeon: Therisa Bi, MD;  Location[facility] ENDOSCOPY;  Service: Gastroenterology;  Laterality: N/A;   TUBAL LIGATION       Social History:   reports that she has been smoking cigarettes. Her smokeless tobacco use includes chew and snuff. She reports that she does not currently use alcohol. She reports that she does not use drugs.   Family History:  Her family history includes Bipolar disorder in her brother and brother; CAD in her father; Heart attack in her father; Other in her mother; Schizophrenia in her brother and brother; Stroke in her mother.   Allergies Allergies[1]   Home Medications  Prior to Admission medications  Medication Sig Start Date End Date Taking? Authorizing Provider  albuterol  (PROVENTIL  HFA) 108 (90 Base) MCG/ACT inhaler Inhale 2 puffs into the lungs every 4-6 hours as needed. 09/21/21  Yes Iloabachie, Chioma E, NP  amphetamine-dextroamphetamine (ADDERALL XR) 10 MG 24 hr capsule Take 10 mg by mouth daily.   Yes [provider]  citalopram  (CELEXA ) 40 MG tablet Take 1 tablet (40 mg total) by mouth daily. 11/21/22  Yes    Fluticasone  Furoate (ARNUITY ELLIPTA ) 200 MCG/ACT AEPB Inhale 1 puff into the lungs daily. 12/06/23  Yes Dgayli, Belva, MD  hydrOXYzine  (ATARAX ) 50 MG tablet Take 1-1.5 tablets (50-75 mg total) by mouth at bedtime as needed for sleep 11/21/22  Yes   lamoTRIgine  (LAMICTAL ) 100 MG tablet Take 1 tablet (100 mg total) by mouth daily. 11/21/22  Yes   rosuvastatin  (CRESTOR ) 10 MG tablet Take 10 mg by mouth at bedtime. 08/13/23  Yes [provider]  traMADol (ULTRAM) 50 MG tablet Take 1 tablet by mouth every 6 (six) hours as needed.   Yes [provider]  umeclidinium-vilanterol (ANORO ELLIPTA ) 62.5-25 MCG/ACT AEPB Inhale 1 puff into the lungs daily. 12/06/23  Yes Dgayli, Belva, MD  ipratropium-albuterol  (DUONEB) 0.5-2.5 (3) MG/3ML SOLN INHALE CONTENTS OF ONE VIAL ( TOTAL) USING NEBULIZER  ONCE EVERY 6 HOURS. 11/02/20 12/06/23  Iloabachie, Chioma E, NP  atomoxetine  (STRATTERA ) 25 MG capsule Take 1 capsule (25 mg total) by mouth once every evening after dinner. Patient not taking: Reported on 12/23/2020 10/26/20 03/09/21    loratadine  (CLARITIN ) 10 MG tablet Take 1 tablet (10 mg total) by mouth daily as needed for allergies. 12/16/19 12/25/19  Iloabachie, Chioma E, NP     Critical care time: 40 minutes    Inge Lecher, AGACNP-BC [facility] Prefer epic messenger for cross cover needs If after hours, please call E-link           [1]  Allergies Allergen Reactions   Flax Seed Oil [Flax Seed Oil] Hives    Pt. Self reported   Other Hives    Pt. Self reported   Trazodone Other (See Comments)    Bed wetting  Other Reaction(s): Other (See Comments)    Bed wetting   "

## 2024-03-12 NOTE — Progress Notes (Signed)
 Pt taken to OR at  1200 for a trach.  While pt is in surgery a heated wire circuit was placed on her vent.  Circuit was tested and passed

## 2024-03-13 ENCOUNTER — Inpatient Hospital Stay: Payer: MEDICAID

## 2024-03-13 ENCOUNTER — Encounter: Payer: Self-pay | Admitting: Otolaryngology

## 2024-03-13 DIAGNOSIS — I10 Essential (primary) hypertension: Secondary | ICD-10-CM

## 2024-03-13 LAB — GLUCOSE, CAPILLARY
Glucose-Capillary: 116 mg/dL — ABNORMAL HIGH (ref 70–99)
Glucose-Capillary: 143 mg/dL — ABNORMAL HIGH (ref 70–99)
Glucose-Capillary: 145 mg/dL — ABNORMAL HIGH (ref 70–99)
Glucose-Capillary: 162 mg/dL — ABNORMAL HIGH (ref 70–99)
Glucose-Capillary: 96 mg/dL (ref 70–99)
Glucose-Capillary: 98 mg/dL (ref 70–99)

## 2024-03-13 LAB — BLOOD GAS, ARTERIAL
Acid-Base Excess: 5.9 mmol/L — ABNORMAL HIGH (ref 0.0–2.0)
Bicarbonate: 29.7 mmol/L — ABNORMAL HIGH (ref 20.0–28.0)
FIO2: 40 %
MECHVT: 440 mL
Mechanical Rate: 24
O2 Saturation: 95.1 %
PEEP: 5 cmH2O
Patient temperature: 37
pCO2 arterial: 39 mmHg (ref 32–48)
pH, Arterial: 7.49 — ABNORMAL HIGH (ref 7.35–7.45)
pO2, Arterial: 74 mmHg — ABNORMAL LOW (ref 83–108)

## 2024-03-13 LAB — RENAL FUNCTION PANEL
Albumin: 3.2 g/dL — ABNORMAL LOW (ref 3.5–5.0)
Anion gap: 11 (ref 5–15)
BUN: 31 mg/dL — ABNORMAL HIGH (ref 6–20)
CO2: 26 mmol/L (ref 22–32)
Calcium: 10 mg/dL (ref 8.9–10.3)
Chloride: 107 mmol/L (ref 98–111)
Creatinine, Ser: 0.73 mg/dL (ref 0.44–1.00)
GFR, Estimated: >60 mL/min
Glucose, Bld: 100 mg/dL — ABNORMAL HIGH (ref 70–99)
Phosphorus: 2 mg/dL — ABNORMAL LOW (ref 2.5–4.6)
Potassium: 4.3 mmol/L (ref 3.5–5.1)
Sodium: 144 mmol/L (ref 135–145)

## 2024-03-13 LAB — CBC
HCT: 33.7 % — ABNORMAL LOW (ref 36.0–46.0)
Hemoglobin: 10.5 g/dL — ABNORMAL LOW (ref 12.0–15.0)
MCH: 28.6 pg (ref 26.0–34.0)
MCHC: 31.2 g/dL (ref 30.0–36.0)
MCV: 91.8 fL (ref 80.0–100.0)
Platelets: 358 10*3/uL (ref 150–400)
RBC: 3.67 MIL/uL — ABNORMAL LOW (ref 3.87–5.11)
RDW: 14.3 % (ref 11.5–15.5)
WBC: 20.3 10*3/uL — ABNORMAL HIGH (ref 4.0–10.5)
nRBC: 0 % (ref 0.0–0.2)

## 2024-03-13 LAB — MAGNESIUM: Magnesium: 1.9 mg/dL (ref 1.7–2.4)

## 2024-03-13 LAB — PHOSPHORUS: Phosphorus: 1.8 mg/dL — ABNORMAL LOW (ref 2.5–4.6)

## 2024-03-13 MED ORDER — AMLODIPINE BESYLATE 10 MG PO TABS
10.0000 mg | ORAL_TABLET | Freq: Every day | ORAL | Status: AC
  Administered 2024-03-13 – 2024-03-21 (×9): 10 mg
  Filled 2024-03-13 (×10): qty 1

## 2024-03-13 MED ORDER — HYDROMORPHONE HCL 1 MG/ML IJ SOLN
0.5000 mg | INTRAMUSCULAR | Status: DC | PRN
  Administered 2024-03-14 (×2): 0.5 mg via INTRAVENOUS
  Filled 2024-03-13 (×2): qty 1

## 2024-03-13 MED ORDER — VITAL 1.5 CAL PO LIQD
1000.0000 mL | ORAL | Status: AC
  Administered 2024-03-13 – 2024-03-20 (×8): 1000 mL

## 2024-03-13 MED ORDER — SODIUM CHLORIDE 0.9% FLUSH: 10.0000 mL | INTRAVENOUS | Status: AC | PRN

## 2024-03-13 MED ORDER — HYDROMORPHONE HCL 1 MG/ML IJ SOLN
0.5000 mg | INTRAMUSCULAR | Status: DC | PRN
  Administered 2024-03-13 (×4): 1 mg via INTRAVENOUS
  Filled 2024-03-13 (×4): qty 1

## 2024-03-13 MED ORDER — OXYCODONE HCL 5 MG PO TABS
10.0000 mg | ORAL_TABLET | Freq: Three times a day (TID) | ORAL | Status: AC
  Administered 2024-03-13 (×3): 10 mg via ORAL
  Filled 2024-03-13 (×3): qty 2

## 2024-03-13 MED ORDER — SODIUM CHLORIDE 0.9% FLUSH
10.0000 mL | Freq: Two times a day (BID) | INTRAVENOUS | Status: AC
  Administered 2024-03-13 – 2024-03-15 (×6): 10 mL
  Administered 2024-03-16: 30 mL
  Administered 2024-03-16 – 2024-03-21 (×11): 10 mL

## 2024-03-13 MED ORDER — OXYCODONE HCL 5 MG PO TABS
5.0000 mg | ORAL_TABLET | Freq: Three times a day (TID) | ORAL | Status: DC
  Administered 2024-03-14: 5 mg via ORAL
  Filled 2024-03-13: qty 1

## 2024-03-13 MED ORDER — PROSOURCE TF20 ENFIT COMPATIBL EN LIQD
60.0000 mL | Freq: Every day | ENTERAL | Status: AC
  Administered 2024-03-14 – 2024-03-21 (×7): 60 mL
  Filled 2024-03-13 (×8): qty 60

## 2024-03-13 MED ORDER — K PHOS MONO-SOD PHOS DI & MONO 155-852-130 MG PO TABS
500.0000 mg | ORAL_TABLET | Freq: Two times a day (BID) | ORAL | Status: AC
  Administered 2024-03-13 (×2): 500 mg
  Filled 2024-03-13 (×2): qty 2

## 2024-03-13 MED ORDER — METHYLPREDNISOLONE SODIUM SUCC 40 MG IJ SOLR
40.0000 mg | Freq: Every day | INTRAMUSCULAR | Status: AC
  Administered 2024-03-14: 40 mg via INTRAVENOUS
  Filled 2024-03-13: qty 1

## 2024-03-13 MED ORDER — NICARDIPINE HCL IN NACL 20-0.86 MG/200ML-% IV SOLN
3.0000 mg/h | INTRAVENOUS | Status: DC
  Administered 2024-03-13: 10 mg/h via INTRAVENOUS
  Administered 2024-03-13 (×3): 7.5 mg/h via INTRAVENOUS
  Administered 2024-03-13 (×2): 10 mg/h via INTRAVENOUS
  Administered 2024-03-13: 5 mg/h via INTRAVENOUS
  Administered 2024-03-14: 15 mg/h via INTRAVENOUS
  Administered 2024-03-14: 10 mg/h via INTRAVENOUS
  Administered 2024-03-14: 15 mg/h via INTRAVENOUS
  Administered 2024-03-14 (×3): 10 mg/h via INTRAVENOUS
  Administered 2024-03-14: 7.5 mg/h via INTRAVENOUS
  Administered 2024-03-14: 15 mg/h via INTRAVENOUS
  Administered 2024-03-14 – 2024-03-15 (×4): 10 mg/h via INTRAVENOUS
  Administered 2024-03-15: 7.5 mg/h via INTRAVENOUS
  Administered 2024-03-15: 10 mg/h via INTRAVENOUS
  Administered 2024-03-15: 5 mg/h via INTRAVENOUS
  Filled 2024-03-13 (×23): qty 200

## 2024-03-13 MED ORDER — MIDAZOLAM HCL (PF) 2 MG/2ML IJ SOLN
2.0000 mg | Freq: Once | INTRAMUSCULAR | Status: AC
  Administered 2024-03-13: 2 mg via INTRAVENOUS
  Filled 2024-03-13: qty 2

## 2024-03-13 NOTE — Progress Notes (Signed)
 "  NAME:  Erica Gomez, MRN:  [id number], DOB:  January 15, 1966, LOS: 13 ADMISSION DATE:  02/29/2024, CONSULTATION DATE:  02/29/24 REFERRING MD:  Dr. Willo, CHIEF COMPLAINT:  Acute Respiratory Distress    Brief Pt Description / Synopsis:  59 y.o. female admitted with Acute Hypoxic and Hypercapnic Respiratory Failure due to Strep Pneumoniae Pneumonia, severe COPD/Asthma Exacerbation requiring intubation and mechanical ventilation.  With failure to wean from vent, ultimately requiring Tracheostomy placement on 03/12/24.  History of Present Illness:  59 y/o F with PMH sig for asthma, COPD, tobacco use disorder, bipolar disorder, seizure, polysubstance abuse, anxiety/depression, and HLD presented to ED BIB [facility] from home for acute respiratory distress.   On Chart review, patient follows with pulmonologist; last visit with Dr. Isadora on 10/25. Meds prescribed at visit included fluticasone  furoate (Arnuity Ellipta ) 200 mcg 1 puff daily and umeclidinium-vilanterol (Anoro Ellipta ) 62.5/25 mcg 1 puff daily. Per husband, patient has been taking medications as prescribed. EMS tx included MgSO? 2 g IV, Solu-Medrol  125 mg IV, DuoNeb x1, and albuterol  x1. During transport, pt became combative/confused and received Haldol 4 mg IM and Versed  5 mg IM. Initially A&O with EMS. SpO? 94% on RA.   ED Course: Initial VS: BP 211/120, HR 142, SpO? 94% on RA. Patient was unresponsive with labored/heaving respirations and in severe resp distress. Concern for hypercapnic resp failure contributing to agitation and AMS. Pt subsequently lost ability to protect airway. Given worsening MS and resp failure, decision made for emergent intubation. Pt received ketamine  and rocuronium  and was intubated successfully without complication.   Labs/Imaging: Labs: WBC 10.7, Hgb 12.7, Hct 40.9, Plt 313. BMP: Na 141, K 4.9, Cl 106, CO? 25, BUN 14, Cr 0.81, Glu 147, Ca 8.9. LFTs WNL: AST 23, ALT 20, Alk Phos 90, Tbili <0.2, Alb 4.6. VBG c/w resp  acidosis/hypercapnia (HCO? 29.8), O? sat 98.3. Lactate 1.7. CXR: no acute cardiopulm process; ETT in mid trachea, enteric tube in stomach. CT head ordered and pending for AMS. PCCM asked to admit for further workup and treatment.  Please see Significant Hospital Events section below for full detailed hospital course.   Pertinent  Medical History   Past Medical History:  Diagnosis Date   Allergy    Anxiety    Asthma    Bilateral carpal tunnel syndrome 12/31/2018   Bipolar affective, manic (HCC) 1996   COPD (chronic obstructive pulmonary disease) (HCC)    Depression    HLD (hyperlipidemia) 03/12/2018   Lung nodule, multiple 09/26/2016   Manic depressive illness (HCC) 1996   Seizures (HCC)    Substance abuse (HCC)    Tobacco abuse 10/04/2016    Micro Data:  02/29/24 SARS-CoV-2 PCR ? negative 02/29/24 Flu PCR ? negative 02/29/24 RVP ? negative 02/29/24 BCx2 ? no growth 02/29/24 MRSA PCR ? negative 02/29/24 Strep pneumo Ur Ag + 02/29/24 Legionella Ur Ag ?  negative 02/29/24: Respiratory viral panel>> negative 03/01/24: Tracheal aspirate>>normal flora 03/05/24: Repeat tracheal aspirate>>normal flora   Antimicrobials:   Anti-infectives (From admission, onward)    Start     Dose/Rate Route Frequency Ordered Stop   03/09/24 0800  ceFEPIme  (MAXIPIME ) 2 g in sodium chloride  0.9 % 100 mL IVPB        2 g 200 mL/hr over 30 Minutes Intravenous Every 8 hours 03/09/24 0635 03/11/24 0925   03/08/24 2015  ceFEPIme  (MAXIPIME ) 2 g in sodium chloride  0.9 % 100 mL IVPB  Status:  Discontinued        2 g  200 mL/hr over 30 Minutes Intravenous Every 12 hours 03/08/24 1205 03/09/24 0635   03/06/24 0000  ceFEPIme  (MAXIPIME ) 2 g in sodium chloride  0.9 % 100 mL IVPB  Status:  Discontinued        2 g 200 mL/hr over 30 Minutes Intravenous Every 8 hours 03/05/24 2329 03/08/24 1205   03/05/24 0200  cefTRIAXone  (ROCEPHIN ) 2 g in sodium chloride  0.9 % 100 mL IVPB  Status:  Discontinued        2 g 200  mL/hr over 30 Minutes Intravenous Every 24 hours 03/05/24 0050 03/05/24 2330   03/01/24 1000  doxycycline  (VIBRAMYCIN ) 100 mg in sodium chloride  0.9 % 250 mL IVPB  Status:  Discontinued        100 mg 125 mL/hr over 120 Minutes Intravenous Every 12 hours 03/01/24 0826 03/01/24 1003   03/01/24 0200  cefTRIAXone  (ROCEPHIN ) 1 g in sodium chloride  0.9 % 100 mL IVPB  Status:  Discontinued        1 g 200 mL/hr over 30 Minutes Intravenous Every 24 hours 03/01/24 0041 03/05/24 0050       Significant Hospital Events: Including procedures, antibiotic start and stop dates in addition to other pertinent events   1/16: Admitted to ICU for asthma/COPD overlap exacerbation with hypercapnic respiratory failure requiring emergent intubation. 03/01/24- patient failed SBT today, remains on mV. She has AKI.   Strep pneumoniae +, will continue Rocephin .  Husband at bedside I met with him and we reviewed medical plan.  03/02/24- patient had resp distress overnight.  She still has AKI but there is an interval improvement.  03/03/24- patient for SBT today.  AKI has improved this am.  She requires heavy sedation. She has family coming in this afternoon who wishes to be present for SBT. 03/04/24- patient passed SBT and was extubated but after 1 hour started to have respiratory distress with severe hypoxemia. Her daughter was present and requested re-intubation. We continue to treat her underlying Asthma COPD exacerbation with strep pneumoniae respiratory infection.  03/05/24- Patient failed SBT today with tachypnea/tachycardia/resp distress 03/06/24- Patient failed SBT and husband was present during evaluation, she was tachypneic and tachycardic. 03/07/24- Asynchronous with vent, with obstructive process/bronchospasm.  Steroids increased to 80 mg BID, Ketamine  gtt started, place A-line for frequent ABG's. 03/08/24 - paralyzed with worsening hypercapnia. Not a candidate for ECMO. Ketamine  increased to 3 mg/hour.  03/10/24-  Overnight pt became hypoxic with O2 sats decreasing to 60% requiring 10 mg of iv vecuronium  x1 dose.  Due to inability to liberate from ventilator ENT consulted for possible tracheostomy placement.  CTA Chest obtained which was negative for acute PE or aortic dissection but suspicious for multifocal pneumonia  03/11/24: Remains mechanically intubated.  Overnight required vecuronium  x1 dose due to vent dyssynchrony.  Husband at bedside currently performing WUA and SBT if she fails will need tracheostomy which is scheduled for 01/28  03/12/24:  No acute events overnight, hemodynamically stable, no vasopressors. Tracheostomy performed by ENT. 03/13/24: No acute events overnight.  Plan for WUA/SBT as tolerated.  Hypertensive with WUA requiring Nicardipine  gtt.  With low grade fever, remove foley, no change in secretions.  Wean steroids to 40 mg daily.  Interim History / Subjective:  As outlined above under Significant Hospital Events section  Objective   Blood pressure (!) 152/82, pulse (!) 115, temperature (!) 101.5 F (38.6 C), resp. rate 20, height 5' 6 (1.676 m), weight 74.5 kg, SpO2 95%.    Vent Mode: PRVC FiO2 (%):  [  40 %] 40 % Set Rate:  [24 bmp] 24 bmp Vt Set:  [420 mL] 420 mL PEEP:  [5 cmH20] 5 cmH20 Plateau Pressure:  [21 cmH20-40 cmH20] 23 cmH20   Intake/Output Summary (Last 24 hours) at 03/13/2024 9066 Last data filed at 03/13/2024 9362 Gross per 24 hour  Intake 911.18 ml  Output 1400 ml  Net -488.82 ml   Filed Weights   03/09/24 0407 03/10/24 0420  Weight: 74.5 kg 74.5 kg    Examination: General: Acutely ill-appearing female, lying in bed, sedated, on MV via tracheostomy HENT: Atraumatic, normocephalic, neck supple, no JVD, Tracheostomy midline clean and intact Lungs: Mechanical breath sounds throughout, even, non labored, synchronous  Cardiovascular: Tachycardia, Regular rhythm, S1-S2, no murmurs, rubs, gallops Abdomen: +BS xr4, Soft, nontender, nondistended, no guarding  or tenderness Extremities: Normal bulk and tone, no deformities, no edema Neuro: Sedated, withdraws from pain, currently not following commands, PERRLA GU: Foley catheter in place  Resolved Hospital Problem list     Assessment & Plan:   #Acute Metabolic Encephalopathy #Sedation needs in setting of mechanical ventilation PMHx: Seizure disorder, anxiety, depression, bipolar disorder, polysubstance abuse  CT Head 01/26: Normal head CT. No acute intracranial abnormality.  -Maintain a RASS goal of 0 to -1 -Propofol  to maintain RASS goal -Avoid sedating medications as able -Daily wake up assessment -Continue home Lamictal  100 mg daily -Continue Oxycodone  via NGT  #Acute Hypoxic & Hypercapnic Respiratory Failure due to ... #Strep Pneumoniae Pneumonia #COPD/Asthma Exacerbation  PMHx: Tobacco use  CTA Chest 03/10/24:  Negative for acute pulmonary embolus or aortic dissection. Bilateral lower lobe partial consolidations with diffuse bilateral peribronchovascular mild ground-glass disease and small nodules, findings are suspicious for multifocal pneumonia. Imaging follow-up to resolution is recommended Multi vessel coronary vascular calcification. - Status post Tracheostomy 03/12/24 - Full vent support, implement lung protective strategies - Plateau pressures less than 30 cm H20 - Wean FiO2 & PEEP as tolerated to maintain O2 sats 88 to 92% - Follow intermittent Chest X-ray & ABG as needed - Spontaneous Breathing Trials when respiratory parameters met and mental status permits - Implement VAP Bundle - Scheduled and prn bronchodilator therapy  - IV Steroids (decrease to 40 mg daily on 1/29)  #Sepsis ~ RESOLVED #Strep Pneumoniae Pneumonia ~ TREATED  #Leukocytosis - Trend WBC's and monitor fever curve  - Follow cultures as above - Completed 7 day course of Ceftriaxone /cefepime    #Anemia without signs of active bleeding  - Trend CBC  - Monitor for s/sx of bleeding  - Transfuse for hgb  <7  #Hyperglycemia - CBG's q4h; Target range of 140 to 180 - SSI - Follow ICU Hypo/Hyperglycemia protocol   Best Practice (right click and Reselect all SmartList Selections daily)   Diet/type: tubefeeds and NPO DVT prophylaxis: SCD's GI prophylaxis: PPI Lines: Left radial arterial line  Foley:  Yes, and it is still needed Code Status:  full code Last date of multidisciplinary goals of care discussion [03/13/24]  1/29: Pts spouse updated at bedside by Dr. Isadora regarding pts condition and current plan of care  Labs   CBC: Recent Labs  Lab 03/09/24 0321 03/10/24 1108 03/11/24 0404 03/12/24 0419 03/13/24 0517  WBC 10.2 16.8* 17.9* 17.4* 20.3*  NEUTROABS  --  14.7*  --   --   --   HGB 9.3* 10.4* 9.3* 9.3* 10.5*  HCT 30.5* 33.0* 30.0* 29.6* 33.7*  MCV 96.5 92.7 96.2 93.4 91.8  PLT 267 330 289 284 358    Basic Metabolic Panel:  Recent Labs  Lab 03/09/24 0321 03/10/24 0159 03/11/24 0404 03/12/24 0419 03/13/24 0517  NA 142 144 144 144 144  K 5.1 4.7 4.8 4.6 4.3  CL 108 110 110 111 107  CO2 26 27 27 27 26   GLUCOSE 148* 116* 125* 123* 100*  BUN 26* 30* 35* 35* 31*  CREATININE 0.79 0.76 0.70 0.72 0.73  CALCIUM  9.2 8.9 9.5 9.4 10.0  MG 3.0* 2.4 2.1 2.1 1.9  PHOS 2.6 2.3* 3.1 3.3 1.8*  2.0*   GFR: Estimated Creatinine Clearance: 79.1 mL/min (by C-G formula based on SCr of 0.73 mg/dL). Recent Labs  Lab 03/10/24 1108 03/11/24 0404 03/12/24 0419 03/13/24 0517  WBC 16.8* 17.9* 17.4* 20.3*    Liver Function Tests: Recent Labs  Lab 03/07/24 0410 03/13/24 0517  AST 26  --   ALT 28  --   ALKPHOS 57  --   BILITOT <0.2  --   PROT 6.2*  --   ALBUMIN 3.5 3.2*   No results for input(s): LIPASE, AMYLASE in the last 168 hours. No results for input(s): AMMONIA in the last 168 hours.  ABG    Component Value Date/Time   PHART 7.49 (H) 03/13/2024 0347   PCO2ART 39 03/13/2024 0347   PO2ART 74 (L) 03/13/2024 0347   HCO3 29.7 (H) 03/13/2024 0347    ACIDBASEDEF 0.3 03/09/2024 1644   O2SAT 95.1 03/13/2024 0347     Coagulation Profile: No results for input(s): INR, PROTIME in the last 168 hours.  Cardiac Enzymes: Recent Labs  Lab 03/09/24 1644  CKTOTAL 93    HbA1C: Hemoglobin A1C  Date/Time Value Ref Range Status  09/21/2021 02:00 PM 6.4 (A) 4.0 - 5.6 % Final  03/22/2021 09:52 AM 6.2 (A) 4.0 - 5.6 % Final  01/15/2014 12:00 AM 6.8  Final   HbA1c, POC (prediabetic range)  Date/Time Value Ref Range Status  05/11/2020 10:30 AM 0 (A) 5.7 - 6.4 % Final   HbA1c, POC (controlled diabetic range)  Date/Time Value Ref Range Status  05/11/2020 10:30 AM 0.0 0.0 - 7.0 % Final   HbA1c POC (<> result, manual entry)  Date/Time Value Ref Range Status  05/11/2020 10:30 AM 6.5 4.0 - 5.6 % Final   Hgb A1c MFr Bld  Date/Time Value Ref Range Status  03/01/2024 02:02 AM 6.4 (H) 4.8 - 5.6 % Final    Comment:    (NOTE) Diagnosis of Diabetes The following HbA1c ranges recommended by the American Diabetes Association (ADA) may be used as an aid in the diagnosis of diabetes mellitus.  Hemoglobin             Suggested A1C NGSP%              Diagnosis  <5.7                   Non Diabetic  5.7-6.4                Pre-Diabetic  >6.4                   Diabetic  <7.0                   Glycemic control for                       adults with diabetes.    12/24/2019 11:08 AM 6.7 (H) 4.8 - 5.6 % Final    Comment:  Prediabetes: 5.7 - 6.4          Diabetes: >6.4          Glycemic control for adults with diabetes: <7.0     CBG: Recent Labs  Lab 03/12/24 1747 03/12/24 1927 03/12/24 2314 03/13/24 0520 03/13/24 0734  GLUCAP 124* 112* 100* 96 98    Review of Systems:   Unable to assess due to intubation/sedation/critical illness   Past Medical History:  She,  has a past medical history of Allergy, Anxiety, Asthma, Bilateral carpal tunnel syndrome (12/31/2018), Bipolar affective, manic (HCC) (1996), COPD (chronic  obstructive pulmonary disease) (HCC), Depression, HLD (hyperlipidemia) (03/12/2018), Lung nodule, multiple (09/26/2016), Manic depressive illness (HCC) (1996), Seizures (HCC), Substance abuse (HCC), and Tobacco abuse (10/04/2016).   Surgical History:   Past Surgical History:  Procedure Laterality Date   ABDOMINAL HYSTERECTOMY     CARPAL TUNNEL RELEASE Right 01/15/2019   Procedure: RIGHTCARPAL TUNNEL RELEASE, LEFT DEQUERVAINS INJECTION;  Surgeon: Jerri Kay HERO, MD;  Location: [facility];  Service: Orthopedics;  Laterality: Right;   CARPAL TUNNEL RELEASE Left 02/26/2019   Procedure: LEFT CARPAL TUNNEL RELEASE;  Surgeon: Jerri Kay HERO, MD;  Location: [facility];  Service: Orthopedics;  Laterality: Left;  Bier block   COLONOSCOPY WITH PROPOFOL  N/A 11/28/2018   Procedure: COLONOSCOPY WITH PROPOFOL ;  Surgeon: Therisa Bi, MD;  Location: [facility];  Service: Gastroenterology;  Laterality: N/A;   TUBAL LIGATION       Social History:   reports that she has been smoking cigarettes. Her smokeless tobacco use includes chew and snuff. She reports that she does not currently use alcohol. She reports that she does not use drugs.   Family History:  Her family history includes Bipolar disorder in her brother and brother; CAD in her father; Heart attack in her father; Other in her mother; Schizophrenia in her brother and brother; Stroke in her mother.   Allergies Allergies[1]   Home Medications  Prior to Admission medications  Medication Sig Start Date End Date Taking? Authorizing Provider  albuterol  (PROVENTIL  HFA) 108 (90 Base) MCG/ACT inhaler Inhale 2 puffs into the lungs every 4-6 hours as needed. 09/21/21  Yes Iloabachie, Chioma E, NP  amphetamine-dextroamphetamine (ADDERALL XR) 10 MG 24 hr capsule Take 10 mg by mouth daily.   Yes [provider]  citalopram  (CELEXA ) 40 MG tablet Take 1 tablet (40 mg total) by mouth daily. 11/21/22  Yes   Fluticasone  Furoate  (ARNUITY ELLIPTA ) 200 MCG/ACT AEPB Inhale 1 puff into the lungs daily. 12/06/23  Yes Dgayli, Belva, MD  hydrOXYzine  (ATARAX ) 50 MG tablet Take 1-1.5 tablets (50-75 mg total) by mouth at bedtime as needed for sleep 11/21/22  Yes   lamoTRIgine  (LAMICTAL ) 100 MG tablet Take 1 tablet (100 mg total) by mouth daily. 11/21/22  Yes   rosuvastatin  (CRESTOR ) 10 MG tablet Take 10 mg by mouth at bedtime. 08/13/23  Yes [provider]  traMADol (ULTRAM) 50 MG tablet Take 1 tablet by mouth every 6 (six) hours as needed.   Yes [provider]  umeclidinium-vilanterol (ANORO ELLIPTA ) 62.5-25 MCG/ACT AEPB Inhale 1 puff into the lungs daily. 12/06/23  Yes Dgayli, Belva, MD  ipratropium-albuterol  (DUONEB) 0.5-2.5 (3) MG/3ML SOLN INHALE CONTENTS OF ONE VIAL ( TOTAL) USING NEBULIZER  ONCE EVERY 6 HOURS. 11/02/20 12/06/23  Iloabachie, Chioma E, NP  atomoxetine  (STRATTERA ) 25 MG capsule Take 1 capsule (25 mg total) by mouth once every evening after dinner. Patient not taking: Reported on 12/23/2020 10/26/20  03/09/21    loratadine  (CLARITIN ) 10 MG tablet Take 1 tablet (10 mg total) by mouth daily as needed for allergies. 12/16/19 12/25/19  Iloabachie, Chioma E, NP     Critical care time: 40 minutes    Inge Lecher, AGACNP-BC [facility] Prefer epic messenger for cross cover needs If after hours, please call E-link            [1]  Allergies Allergen Reactions   Flax Seed Oil [Flax Seed Oil] Hives    Pt. Self reported   Other Hives    Pt. Self reported   Trazodone Other (See Comments)    Bed wetting  Other Reaction(s): Other (See Comments)    Bed wetting   "

## 2024-03-13 NOTE — Progress Notes (Signed)
 Nutrition Follow Up Note   DOCUMENTATION CODES:   Not applicable  INTERVENTION:   Change Vital 1.5@55ml /hr- Initiate at 41ml/hr and increase by 10ml/hr q 8 hours until goal rate is reached.   ProSource TF 20- Give 60ml daily via tube, each supplement provides 80kcal and 20g of protein.   Free water  flushes 30ml q4 hours to maintain tube patency   Regimen provides 2060kcal/day, 109g/day protein and 112ml/day of free water .   Continue thiamine  100mg  daily via tube  Pt remains at high refeed risk; recommend monitor potassium, magnesium  and phosphorus labs daily until stable  Daily weights   NUTRITION DIAGNOSIS:   Inadequate oral intake related to inability to eat (pt sedated and ventilated) as evidenced by NPO status. -ongoing   GOAL:   Provide needs based on ASPEN/SCCM guidelines -not met   MONITOR:   TF tolerance, Vent status, Labs, Weight trends, Skin, I & O's  ASSESSMENT:   59 y/o female with h/o DM, COPD, seizures, lung nodules, CAD, anxiety, MDD, bipolar disorder, HLD and substance abuse who is admitted with AMS, COPD, exacerbation, PNA and sepsis.  -Pt s/p tracheostomy 1/28 -Pt s/p dobhoff tube placement today   Pt remains sedated and ventilated via tracheostomy. Dobhoff tube placed today. No more tan colored secretions noted. Will begin increasing tube feeds to goal rate. Pt remains at high refeed risk. Pt is having bowel function. No new weight since 1/26. IR G-tube is pending.   Medications reviewed and include: celexa , solu-medrol , MVI, nicotine , oxycodone , protonix , Kphos, miralax , senokot, thiamine , propofol     Labs reviewed: K 4.3 wnl, BUN 31(H), P 1.8(L), Mg 1.9 wnl Wbc- 20.3H), Hgb 10.5(L), Hct 33.7(L) Cbgs- 116, 98, 96 x 24 hrs   Patient is currently intubated on ventilator support MV: 12.1 L/min Temp (24hrs), Avg:99.7 F (37.6 C), Min:97.5 F (36.4 C), Max:101.5 F (38.6 C)  Propofol : 4.47 ml/hr- provides 118kcal/day  MAP >   UOP-    Diet Order:    Diet Order     None      EDUCATION NEEDS:   No education needs have been identified at this time  Skin:  Skin Assessment: Reviewed RN Assessment  Last BM:  1/28- TYPE 7  Height:   Ht Readings from Last 1 Encounters:  02/29/24 5' 6 (1.676 m)    Weight:   Wt Readings from Last 1 Encounters:  03/10/24 74.5 kg    Ideal Body Weight:  59 kg  BMI:  Body mass index is 26.51 kg/m.  Estimated Nutritional Needs:   Kcal:  1800-2100kcal/day  Protein:  105-120g/day  Fluid:  1.6-1.8L/day  Augustin Shams MS, RD, LDN If unable to be reached, please send secure chat to RD inpatient available from 8:00a-4:00p daily

## 2024-03-13 NOTE — Plan of Care (Signed)
" °  Problem: Education: Goal: Knowledge of General Education information will improve Description: Including pain rating scale, medication(s)/side effects and non-pharmacologic comfort measures Outcome: Progressing   Problem: Health Behavior/Discharge Planning: Goal: Ability to manage health-related needs will improve Outcome: Progressing   Problem: Clinical Measurements: Goal: Ability to maintain clinical measurements within normal limits will improve Outcome: Progressing Goal: Will remain free from infection Outcome: Progressing Goal: Diagnostic test results will improve Outcome: Progressing Goal: Respiratory complications will improve Outcome: Progressing Goal: Cardiovascular complication will be avoided Outcome: Progressing   Problem: Activity: Goal: Risk for activity intolerance will decrease Outcome: Progressing   Problem: Nutrition: Goal: Adequate nutrition will be maintained Outcome: Progressing   Problem: Coping: Goal: Level of anxiety will decrease Outcome: Progressing   Problem: Elimination: Goal: Will not experience complications related to bowel motility Outcome: Progressing Goal: Will not experience complications related to urinary retention Outcome: Progressing   Problem: Pain Managment: Goal: General experience of comfort will improve and/or be controlled Outcome: Progressing   Problem: Safety: Goal: Ability to remain free from injury will improve Outcome: Progressing   Problem: Skin Integrity: Goal: Risk for impaired skin integrity will decrease Outcome: Progressing   Problem: Education: Goal: Ability to describe self-care measures that may prevent or decrease complications (Diabetes Survival Skills Education) will improve Outcome: Progressing Goal: Individualized Educational Video(s) Outcome: Progressing   Problem: Coping: Goal: Ability to adjust to condition or change in health will improve Outcome: Progressing   Problem: Fluid  Volume: Goal: Ability to maintain a balanced intake and output will improve Outcome: Progressing   Problem: Health Behavior/Discharge Planning: Goal: Ability to identify and utilize available resources and services will improve Outcome: Progressing Goal: Ability to manage health-related needs will improve Outcome: Progressing   Problem: Metabolic: Goal: Ability to maintain appropriate glucose levels will improve Outcome: Progressing   Problem: Nutritional: Goal: Maintenance of adequate nutrition will improve Outcome: Progressing Goal: Progress toward achieving an optimal weight will improve Outcome: Progressing   Problem: Skin Integrity: Goal: Risk for impaired skin integrity will decrease Outcome: Progressing   Problem: Tissue Perfusion: Goal: Adequacy of tissue perfusion will improve Outcome: Progressing   Problem: Activity: Goal: Ability to tolerate increased activity will improve Outcome: Progressing   Problem: Respiratory: Goal: Ability to maintain a clear airway and adequate ventilation will improve Outcome: Progressing   Problem: Role Relationship: Goal: Method of communication will improve Outcome: Progressing   Problem: Activity: Goal: Ability to tolerate increased activity will improve Outcome: Progressing   Problem: Clinical Measurements: Goal: Ability to maintain a body temperature in the normal range will improve Outcome: Progressing   Problem: Respiratory: Goal: Ability to maintain adequate ventilation will improve Outcome: Progressing Goal: Ability to maintain a clear airway will improve Outcome: Progressing   "

## 2024-03-13 NOTE — TOC Progression Note (Signed)
 Transition of Care [facility]) - Progression Note    Patient Details  Name: Erica Gomez MRN: [id number] Date of Birth: 07/22/1965  [facility] of [facility]) CM/SW Contact  [name], LCSW Phone Number: 03/13/2024, 1:58 PM  Clinical Narrative:    Chart reviewed. Spoke with the patient husband and daughter about LTACH rec. The patient family was accepting and consented to [facility] referral.                      Expected Discharge Plan and Services                                               Social Drivers of Health (SDOH) Interventions SDOH Screenings   Food Insecurity: Patient Unable To Answer (02/29/2024)  Housing: Patient Unable To Answer (02/29/2024)  Transportation Needs: Patient Unable To Answer (02/29/2024)  Utilities: Patient Unable To Answer (02/29/2024)  Tobacco Use: High Risk (02/29/2024)    Readmission Risk Interventions     No data to display

## 2024-03-13 NOTE — Progress Notes (Signed)

## 2024-03-13 NOTE — Progress Notes (Signed)
 Patient failed SBT due to increased respirations, HR and Blood pressure.

## 2024-03-13 NOTE — Progress Notes (Signed)
 Pt unresponsive at the beginning of the shift. Started to go down on sedation. Around midnight propofol  was at 30mcg/hr and dilaudid  at 67ml/hr and patient started to responds to pain, however, she is unable to follow commands and track people in the room. When dilaudid  turned off and propofol  at 10mcg/hr pt became dyssynchronous with vent. Patient seems agitated with RASS+3,HR 100-130, SBP 200's, breathing is shallow and tachypnea.  PRN hydralazine  and labetalol  given. When attempting to give bath and suction patient,  patient became very agitated. Dilaudid  prn given. Cardene  started but IV infiltrated, so IV team to start second IV. Propofol  increased to 20mcg/hr, however, patient still agitated with hypertension, tachycardia, and tachypnea, so I was unable to complete bath and suction patient. Karlie, PA aware of patient condition throughout the night. New orders given, see MAR for care.

## 2024-03-13 NOTE — Anesthesia Postprocedure Evaluation (Signed)
"   Anesthesia Post Note  Patient: Erica Gomez  Procedure(s) Performed: CREATION, TRACHEOSTOMY (Neck)  Patient location during evaluation: ICU Anesthesia Type: General Level of consciousness: sedated Pain management: pain level controlled Vital Signs Assessment: post-procedure vital signs reviewed and stable Respiratory status: patient on ventilator - see flowsheet for VS Anesthetic complications: no   No notable events documented.   Last Vitals:  Vitals:   03/13/24 0600 03/13/24 0615  BP:    Pulse: 99 99  Resp: 20 (!) 21  Temp: (!) 38.4 C (!) 38.4 C  SpO2: 96% 95%    Last Pain:  Vitals:   03/13/24 0530  TempSrc: Rectal                 Alfonso Ruths      "

## 2024-03-13 NOTE — Progress Notes (Signed)
 PHARMACY CONSULT NOTE - ELECTROLYTES  Pharmacy Consult for Electrolyte Monitoring and Replacement   Recent Labs: Height: 5' 6 (167.6 cm) Weight: 74.5 kg (164 lb 3.9 oz) IBW/kg (Calculated) : 59.3 Estimated Creatinine Clearance: 79.1 mL/min (by C-G formula based on SCr of 0.73 mg/dL).  Potassium (mmol/L)  Date Value  03/13/2024 4.3  11/12/2013 4.1   Magnesium  (mg/dL)  Date Value  98/70/7973 1.9   Calcium  (mg/dL)  Date Value  98/70/7973 10.0   Calcium , Total (mg/dL)  Date Value  90/69/7984 9.0   Albumin (g/dL)  Date Value  98/70/7973 3.2 (L)  05/12/2020 4.5  11/12/2013 3.5   Phosphorus (mg/dL)  Date Value  98/70/7973 1.8 (L)  03/13/2024 2.0 (L)   Sodium (mmol/L)  Date Value  03/13/2024 144  12/06/2022 139  11/12/2013 141   Assessment  Erica Gomez is a 59 y.o. female presenting with respiratory failure. PMH significant for COPD, asthma, CAD, seizures, and bipolar disorder. Pt has been hyperkalemic up to 6.1 on 1/17. Pharmacy has been consulted to monitor and replace electrolytes.  Diet: Tube feeds 69mL/hr + free water  30mL Q4H  MIVF: none  Goal of Therapy: Electrolytes WNL  Plan:  --K phos  500mg  per tube x2 --Follow-up electrolytes with AM labs tomorrow  Thank you for allowing pharmacy to be a part of this patient's care.  Belvie Macintosh, PharmD Candidate 03/13/2024 8:04 AM

## 2024-03-14 ENCOUNTER — Inpatient Hospital Stay: Payer: MEDICAID

## 2024-03-14 ENCOUNTER — Encounter: Payer: Self-pay | Admitting: Otolaryngology

## 2024-03-14 DIAGNOSIS — J154 Pneumonia due to other streptococci: Secondary | ICD-10-CM

## 2024-03-14 DIAGNOSIS — G929 Unspecified toxic encephalopathy: Secondary | ICD-10-CM

## 2024-03-14 LAB — RENAL FUNCTION PANEL
Albumin: 3.2 g/dL — ABNORMAL LOW (ref 3.5–5.0)
Anion gap: 11 (ref 5–15)
BUN: 26 mg/dL — ABNORMAL HIGH (ref 6–20)
CO2: 25 mmol/L (ref 22–32)
Calcium: 9.2 mg/dL (ref 8.9–10.3)
Chloride: 109 mmol/L (ref 98–111)
Creatinine, Ser: 0.71 mg/dL (ref 0.44–1.00)
GFR, Estimated: >60 mL/min
Glucose, Bld: 152 mg/dL — ABNORMAL HIGH (ref 70–99)
Phosphorus: 3.3 mg/dL (ref 2.5–4.6)
Potassium: 3.8 mmol/L (ref 3.5–5.1)
Sodium: 144 mmol/L (ref 135–145)

## 2024-03-14 LAB — URINALYSIS, COMPLETE (UACMP) WITH MICROSCOPIC
Bilirubin Urine: NEGATIVE
Glucose, UA: NEGATIVE mg/dL
Ketones, ur: NEGATIVE mg/dL
Leukocytes,Ua: NEGATIVE
Nitrite: NEGATIVE
Protein, ur: NEGATIVE mg/dL
Specific Gravity, Urine: 1.011 (ref 1.005–1.030)
pH: 5 (ref 5.0–8.0)

## 2024-03-14 LAB — CBC
HCT: 32.8 % — ABNORMAL LOW (ref 36.0–46.0)
Hemoglobin: 10.5 g/dL — ABNORMAL LOW (ref 12.0–15.0)
MCH: 29 pg (ref 26.0–34.0)
MCHC: 32 g/dL (ref 30.0–36.0)
MCV: 90.6 fL (ref 80.0–100.0)
Platelets: 354 10*3/uL (ref 150–400)
RBC: 3.62 MIL/uL — ABNORMAL LOW (ref 3.87–5.11)
RDW: 14.3 % (ref 11.5–15.5)
WBC: 18.7 10*3/uL — ABNORMAL HIGH (ref 4.0–10.5)
nRBC: 0 % (ref 0.0–0.2)

## 2024-03-14 LAB — BASIC METABOLIC PANEL WITH GFR
Anion gap: 11 (ref 5–15)
Anion gap: 12 (ref 5–15)
BUN: 27 mg/dL — ABNORMAL HIGH (ref 6–20)
BUN: 27 mg/dL — ABNORMAL HIGH (ref 6–20)
CO2: 22 mmol/L (ref 22–32)
CO2: 24 mmol/L (ref 22–32)
Calcium: 9.1 mg/dL (ref 8.9–10.3)
Calcium: 9.2 mg/dL (ref 8.9–10.3)
Chloride: 108 mmol/L (ref 98–111)
Chloride: 108 mmol/L (ref 98–111)
Creatinine, Ser: 0.79 mg/dL (ref 0.44–1.00)
Creatinine, Ser: 0.83 mg/dL (ref 0.44–1.00)
GFR, Estimated: >60 mL/min
GFR, Estimated: >60 mL/min
Glucose, Bld: 177 mg/dL — ABNORMAL HIGH (ref 70–99)
Glucose, Bld: 262 mg/dL — ABNORMAL HIGH (ref 70–99)
Potassium: 3.6 mmol/L (ref 3.5–5.1)
Potassium: 4.3 mmol/L (ref 3.5–5.1)
Sodium: 141 mmol/L (ref 135–145)
Sodium: 143 mmol/L (ref 135–145)

## 2024-03-14 LAB — GLUCOSE, CAPILLARY
Glucose-Capillary: 184 mg/dL — ABNORMAL HIGH (ref 70–99)
Glucose-Capillary: 162 mg/dL — ABNORMAL HIGH (ref 70–99)
Glucose-Capillary: 173 mg/dL — ABNORMAL HIGH (ref 70–99)
Glucose-Capillary: 228 mg/dL — ABNORMAL HIGH (ref 70–99)
Glucose-Capillary: 222 mg/dL — ABNORMAL HIGH (ref 70–99)
Glucose-Capillary: 202 mg/dL — ABNORMAL HIGH (ref 70–99)

## 2024-03-14 LAB — MAGNESIUM: Magnesium: 2 mg/dL (ref 1.7–2.4)

## 2024-03-14 MED ORDER — MIDAZOLAM HCL (PF) 2 MG/2ML IJ SOLN
2.0000 mg | Freq: Once | INTRAMUSCULAR | Status: AC
  Administered 2024-03-15: 2 mg via INTRAVENOUS
  Filled 2024-03-14: qty 2

## 2024-03-14 MED ORDER — HYDROMORPHONE HCL 1 MG/ML IJ SOLN
2.0000 mg | INTRAMUSCULAR | Status: AC
  Administered 2024-03-14: 2 mg via INTRAVENOUS
  Filled 2024-03-14: qty 2

## 2024-03-14 MED ORDER — VANCOMYCIN HCL 1750 MG/350ML IV SOLN
1750.0000 mg | Freq: Once | INTRAVENOUS | Status: AC
  Administered 2024-03-14: 1750 mg via INTRAVENOUS
  Filled 2024-03-14: qty 350

## 2024-03-14 MED ORDER — PREDNISONE 20 MG PO TABS: 20.0000 mg | ORAL_TABLET | Freq: Every day | ORAL | Status: DC

## 2024-03-14 MED ORDER — FUROSEMIDE 10 MG/ML IJ SOLN
40.0000 mg | Freq: Once | INTRAMUSCULAR | Status: AC
  Administered 2024-03-14: 40 mg via INTRAVENOUS
  Filled 2024-03-14: qty 4

## 2024-03-14 MED ORDER — PREDNISONE 20 MG PO TABS: 30.0000 mg | ORAL_TABLET | Freq: Every day | ORAL | Status: DC

## 2024-03-14 MED ORDER — ENOXAPARIN SODIUM 40 MG/0.4ML IJ SOSY
40.0000 mg | PREFILLED_SYRINGE | INTRAMUSCULAR | Status: AC
  Administered 2024-03-14 – 2024-03-21 (×6): 40 mg via SUBCUTANEOUS
  Filled 2024-03-14 (×7): qty 0.4

## 2024-03-14 MED ORDER — VANCOMYCIN HCL 1750 MG/350ML IV SOLN
1750.0000 mg | INTRAVENOUS | Status: DC
  Administered 2024-03-15 – 2024-03-16 (×2): 1750 mg via INTRAVENOUS
  Filled 2024-03-14 (×2): qty 350

## 2024-03-14 MED ORDER — POTASSIUM CHLORIDE 20 MEQ PO PACK
40.0000 meq | PACK | Freq: Once | ORAL | Status: AC
  Administered 2024-03-14: 40 meq
  Filled 2024-03-14: qty 2

## 2024-03-14 MED ORDER — HYDROMORPHONE HCL 1 MG/ML IJ SOLN
1.0000 mg | INTRAMUSCULAR | Status: AC | PRN
  Administered 2024-03-14 – 2024-03-18 (×7): 1 mg via INTRAVENOUS
  Filled 2024-03-14 (×7): qty 1

## 2024-03-14 MED ORDER — HYDROMORPHONE HCL 1 MG/ML IJ SOLN: 2.0000 mg | Freq: Once | INTRAMUSCULAR | Status: DC

## 2024-03-14 MED ORDER — OXYCODONE HCL 5 MG PO TABS
10.0000 mg | ORAL_TABLET | Freq: Three times a day (TID) | ORAL | Status: DC
  Administered 2024-03-14 – 2024-03-16 (×7): 10 mg via ORAL
  Filled 2024-03-14 (×7): qty 2

## 2024-03-14 MED ORDER — ALBUTEROL SULFATE (2.5 MG/3ML) 0.083% IN NEBU
2.5000 mg | INHALATION_SOLUTION | RESPIRATORY_TRACT | Status: DC
  Administered 2024-03-14 – 2024-03-15 (×7): 2.5 mg via RESPIRATORY_TRACT
  Filled 2024-03-14 (×7): qty 3

## 2024-03-14 MED ORDER — PREDNISONE 10 MG PO TABS: 10.0000 mg | ORAL_TABLET | Freq: Every day | ORAL | Status: DC

## 2024-03-14 MED ORDER — ACETAMINOPHEN 10 MG/ML IV SOLN
1000.0000 mg | Freq: Once | INTRAVENOUS | Status: AC
  Administered 2024-03-14: 1000 mg via INTRAVENOUS
  Filled 2024-03-14: qty 100

## 2024-03-14 MED ORDER — PIPERACILLIN-TAZOBACTAM 3.375 G IVPB
3.3750 g | Freq: Three times a day (TID) | INTRAVENOUS | Status: DC
  Administered 2024-03-14 – 2024-03-16 (×5): 3.375 g via INTRAVENOUS
  Filled 2024-03-14 (×5): qty 50

## 2024-03-14 MED ORDER — PREDNISONE 20 MG PO TABS
40.0000 mg | ORAL_TABLET | Freq: Every day | ORAL | Status: DC
  Administered 2024-03-15: 40 mg
  Filled 2024-03-14 (×2): qty 2

## 2024-03-14 NOTE — TOC Progression Note (Signed)
 Transition of Care [facility]) - Progression Note    Patient Details  Name: Erica Gomez MRN: [id number] Date of Birth: Aug 28, 1965  Transition of Care [facility]) CM/SW Contact  [name], LCSW Phone Number: 03/14/2024, 3:37 PM  Clinical Narrative:    Chart reviewed. Spoke with DJ, [facility] rep and he reports that the patient will need to reinstate [employer] insurance and then will need auth.   Will continue to follow up with [facility].                      Expected Discharge Plan and Services                                               Social Drivers of Health (SDOH) Interventions SDOH Screenings   Food Insecurity: Patient Unable To Answer (02/29/2024)  Housing: Patient Unable To Answer (02/29/2024)  Transportation Needs: Patient Unable To Answer (02/29/2024)  Utilities: Patient Unable To Answer (02/29/2024)  Tobacco Use: High Risk (02/29/2024)    Readmission Risk Interventions     No data to display

## 2024-03-14 NOTE — Plan of Care (Signed)
" °  Problem: Education: Goal: Knowledge of General Education information will improve Description: Including pain rating scale, medication(s)/side effects and non-pharmacologic comfort measures Outcome: Not Progressing   Problem: Health Behavior/Discharge Planning: Goal: Ability to manage health-related needs will improve Outcome: Not Progressing   Problem: Clinical Measurements: Goal: Ability to maintain clinical measurements within normal limits will improve Outcome: Not Progressing Goal: Will remain free from infection Outcome: Not Progressing Goal: Diagnostic test results will improve Outcome: Progressing Goal: Respiratory complications will improve Outcome: Not Progressing Goal: Cardiovascular complication will be avoided Outcome: Progressing   Problem: Activity: Goal: Risk for activity intolerance will decrease Outcome: Not Progressing   Problem: Nutrition: Goal: Adequate nutrition will be maintained Outcome: Not Progressing   Problem: Coping: Goal: Level of anxiety will decrease Outcome: Not Progressing   Problem: Elimination: Goal: Will not experience complications related to bowel motility Outcome: Progressing Goal: Will not experience complications related to urinary retention Outcome: Progressing   Problem: Pain Managment: Goal: General experience of comfort will improve and/or be controlled Outcome: Not Progressing   Problem: Safety: Goal: Ability to remain free from injury will improve Outcome: Progressing   Problem: Skin Integrity: Goal: Risk for impaired skin integrity will decrease Outcome: Progressing   Problem: Education: Goal: Ability to describe self-care measures that may prevent or decrease complications (Diabetes Survival Skills Education) will improve Outcome: Not Progressing Goal: Individualized Educational Video(s) Outcome: Not Progressing   Problem: Coping: Goal: Ability to adjust to condition or change in health will  improve Outcome: Not Progressing   Problem: Fluid Volume: Goal: Ability to maintain a balanced intake and output will improve Outcome: Progressing   Problem: Health Behavior/Discharge Planning: Goal: Ability to identify and utilize available resources and services will improve Outcome: Not Progressing Goal: Ability to manage health-related needs will improve Outcome: Not Progressing   Problem: Metabolic: Goal: Ability to maintain appropriate glucose levels will improve Outcome: Progressing   Problem: Nutritional: Goal: Maintenance of adequate nutrition will improve Outcome: Progressing Goal: Progress toward achieving an optimal weight will improve Outcome: Progressing   Problem: Skin Integrity: Goal: Risk for impaired skin integrity will decrease Outcome: Progressing   Problem: Tissue Perfusion: Goal: Adequacy of tissue perfusion will improve Outcome: Progressing   Problem: Respiratory: Goal: Ability to maintain a clear airway and adequate ventilation will improve Outcome: Not Progressing   Problem: Role Relationship: Goal: Method of communication will improve Outcome: Not Progressing   Problem: Activity: Goal: Ability to tolerate increased activity will improve Outcome: Not Progressing   Problem: Clinical Measurements: Goal: Ability to maintain a body temperature in the normal range will improve Outcome: Not Progressing   Problem: Respiratory: Goal: Ability to maintain adequate ventilation will improve Outcome: Not Progressing Goal: Ability to maintain a clear airway will improve Outcome: Not Progressing   "

## 2024-03-14 NOTE — Progress Notes (Signed)
 Pharmacy Antibiotic Note  Erica Gomez is a 59 y.o. female w/ PMH of COPD, asthma, CAD, seizures, and bipolar disorder admitted on 02/29/2024 with respiratory failure.  Pharmacy has been consulted for vancomycin  and Zosyn  dosing.  Plan:  1) start Zosyn  3.375g IV q8h (4 hour infusion).  2) start vancomycin  1750 mg IV x 1 then  1750 mg IV every 24 hours Goal AUC 400-550. Expected AUC: 506.7 SCr used: 0.80 mg/dL   Height: 5' 6 (832.3 cm) Weight: 74.5 kg (164 lb 3.9 oz) IBW/kg (Calculated) : 59.3  Temp (24hrs), Avg:101.2 F (38.4 C), Min:100.6 F (38.1 C), Max:101.8 F (38.8 C)  Recent Labs  Lab 03/10/24 1108 03/11/24 0404 03/12/24 0419 03/13/24 0517 03/14/24 0045 03/14/24 1022  WBC 16.8* 17.9* 17.4* 20.3* 18.7*  --   CREATININE  --  0.70 0.72 0.73 0.71 0.79    Estimated Creatinine Clearance: 79.1 mL/min (by C-G formula based on SCr of 0.79 mg/dL).    Allergies[1]  Antimicrobials this admission: 01/17 ceftriaxone   >> 01/21 01/22 cefepime  >> 01/27 01/30 vancomycin  >> 01/30 Zosyn  >>  Microbiology results: 01/26 BCx: NG 01/30 RCx: pending  01/21 RCx: rGNR / NRF 01/16 MRSA PCR: negative  Thank you for allowing pharmacy to be a part of this patients care.  Erica Gomez 03/14/2024 7:21 PM     [1]  Allergies Allergen Reactions   Flax Seed Oil [Flax Seed Oil] Hives    Pt. Self reported   Other Hives    Pt. Self reported   Trazodone Other (See Comments)    Bed wetting  Other Reaction(s): Other (See Comments)    Bed wetting

## 2024-03-14 NOTE — Progress Notes (Signed)
 PHARMACY CONSULT NOTE - ELECTROLYTES  Pharmacy Consult for Electrolyte Monitoring and Replacement   Recent Labs: Height: 5' 6 (167.6 cm) Weight: 74.5 kg (164 lb 3.9 oz) IBW/kg (Calculated) : 59.3 Estimated Creatinine Clearance: 79.1 mL/min (by C-G formula based on SCr of 0.71 mg/dL).  Potassium (mmol/L)  Date Value  03/14/2024 3.8  11/12/2013 4.1   Magnesium  (mg/dL)  Date Value  98/69/7973 2.0   Calcium  (mg/dL)  Date Value  98/69/7973 9.2   Calcium , Total (mg/dL)  Date Value  90/69/7984 9.0   Albumin (g/dL)  Date Value  98/69/7973 3.2 (L)  05/12/2020 4.5  11/12/2013 3.5   Phosphorus (mg/dL)  Date Value  98/69/7973 3.3   Sodium (mmol/L)  Date Value  03/14/2024 144  12/06/2022 139  11/12/2013 141   Assessment  Erica Gomez is a 59 y.o. female presenting with respiratory failure. PMH significant for COPD, asthma, CAD, seizures, and bipolar disorder. Pt has been hyperkalemic up to 6.1 on 1/17. Pharmacy has been consulted to monitor and replace electrolytes.  Diet: Tube feeds 55 mL/hr + free water  30mL Q4H  Goal of Therapy: Electrolytes WNL  Plan:  --No electrolyte replacement indicated at this time --Follow-up electrolytes with AM labs tomorrow  Thank you for allowing pharmacy to be a part of this patient's care.  [name] 03/14/2024 8:05 AM

## 2024-03-14 NOTE — Progress Notes (Addendum)
 0800 Patient trached and on ventilator. Tracheostomy care done. Inner canula changed. Site clean and dry. Very little drainage noted. Patient's blood pressure,heart rate and respiratory rate are all higher than normal. Dr Malka notified. Assessed per MD. Orders received. 1300 B/P elevated again. Given prn x 2 for blood pressure and 1 for pain. Narcardipine drip restarted.  1320 Nicardipine  drip increased to 10 mg/hr. 1400 No change. MD notified of patient's blood pressure and medications.  1445 Orders received. Patient not tachypenic but respiratory effort is very exaggerated.Nicardipine  drip increased to 15 mg/hr. 1530 Respiratory rate mid to upper 20s. B/P better.Respiratory effort is still exaggerated but patient looks better overall. 1800 Remains in [place]. And ventilated.  Drainage around tracheostomy is foul smelling.  Given prn for pain. Urine,sputum and blood cultures repeated. IV Tylenol  given to reduce fever.

## 2024-03-15 ENCOUNTER — Inpatient Hospital Stay: Payer: MEDICAID

## 2024-03-15 LAB — RENAL FUNCTION PANEL
Albumin: 3.1 g/dL — ABNORMAL LOW (ref 3.5–5.0)
Anion gap: 12 (ref 5–15)
BUN: 26 mg/dL — ABNORMAL HIGH (ref 6–20)
CO2: 24 mmol/L (ref 22–32)
Calcium: 9.5 mg/dL (ref 8.9–10.3)
Chloride: 109 mmol/L (ref 98–111)
Creatinine, Ser: 0.78 mg/dL (ref 0.44–1.00)
GFR, Estimated: >60 mL/min
Glucose, Bld: 152 mg/dL — ABNORMAL HIGH (ref 70–99)
Phosphorus: 3.4 mg/dL (ref 2.5–4.6)
Potassium: 4.2 mmol/L (ref 3.5–5.1)
Sodium: 145 mmol/L (ref 135–145)

## 2024-03-15 LAB — BLOOD GAS, ARTERIAL
Acid-base deficit: 1.3 mmol/L (ref 0.0–2.0)
Bicarbonate: 22.6 mmol/L (ref 20.0–28.0)
FIO2: 50 %
MECHVT: 420 mL
Mechanical Rate: 18
O2 Saturation: 99 %
PEEP: 5 cmH2O
Patient temperature: 37
pCO2 arterial: 34 mmHg (ref 32–48)
pH, Arterial: 7.43 (ref 7.35–7.45)
pO2, Arterial: 149 mmHg — ABNORMAL HIGH (ref 83–108)

## 2024-03-15 LAB — MAGNESIUM: Magnesium: 2.1 mg/dL (ref 1.7–2.4)

## 2024-03-15 LAB — CBC
HCT: 33.4 % — ABNORMAL LOW (ref 36.0–46.0)
Hemoglobin: 10.8 g/dL — ABNORMAL LOW (ref 12.0–15.0)
MCH: 29 pg (ref 26.0–34.0)
MCHC: 32.3 g/dL (ref 30.0–36.0)
MCV: 89.5 fL (ref 80.0–100.0)
Platelets: 366 10*3/uL (ref 150–400)
RBC: 3.73 MIL/uL — ABNORMAL LOW (ref 3.87–5.11)
RDW: 14.6 % (ref 11.5–15.5)
WBC: 23.1 10*3/uL — ABNORMAL HIGH (ref 4.0–10.5)
nRBC: 0 % (ref 0.0–0.2)

## 2024-03-15 LAB — GLUCOSE, CAPILLARY
Glucose-Capillary: 126 mg/dL — ABNORMAL HIGH (ref 70–99)
Glucose-Capillary: 144 mg/dL — ABNORMAL HIGH (ref 70–99)
Glucose-Capillary: 149 mg/dL — ABNORMAL HIGH (ref 70–99)
Glucose-Capillary: 182 mg/dL — ABNORMAL HIGH (ref 70–99)
Glucose-Capillary: 192 mg/dL — ABNORMAL HIGH (ref 70–99)
Glucose-Capillary: 205 mg/dL — ABNORMAL HIGH (ref 70–99)

## 2024-03-15 LAB — TRIGLYCERIDES: Triglycerides: 149 mg/dL

## 2024-03-15 LAB — MRSA NEXT GEN BY PCR, NASAL: MRSA by PCR Next Gen: DETECTED — AB

## 2024-03-15 LAB — PHOSPHORUS: Phosphorus: 3.4 mg/dL (ref 2.5–4.6)

## 2024-03-15 MED ORDER — METHYLPREDNISOLONE SODIUM SUCC 40 MG IJ SOLR
40.0000 mg | Freq: Every day | INTRAMUSCULAR | Status: DC
  Administered 2024-03-15 – 2024-03-19 (×5): 40 mg via INTRAVENOUS
  Filled 2024-03-15 (×5): qty 1

## 2024-03-15 MED ORDER — ALBUTEROL SULFATE (2.5 MG/3ML) 0.083% IN NEBU
10.0000 mg | INHALATION_SOLUTION | Freq: Once | RESPIRATORY_TRACT | Status: AC
  Administered 2024-03-15: 10 mg via RESPIRATORY_TRACT
  Filled 2024-03-15: qty 12

## 2024-03-15 MED ORDER — ROCURONIUM BROMIDE 10 MG/ML (PF) SYRINGE
60.0000 mg | PREFILLED_SYRINGE | INTRAVENOUS | Status: AC
  Administered 2024-03-15: 60 mg via INTRAVENOUS
  Filled 2024-03-15: qty 10

## 2024-03-15 MED ORDER — MIDAZOLAM HCL (PF) 2 MG/2ML IJ SOLN: 2.0000 mg | Freq: Once | INTRAMUSCULAR | Status: DC

## 2024-03-15 MED ORDER — VECURONIUM BROMIDE 10 MG IV SOLR
10.0000 mg | Freq: Once | INTRAVENOUS | Status: AC
  Administered 2024-03-15: 10 mg via INTRAVENOUS
  Filled 2024-03-15: qty 10

## 2024-03-15 MED ORDER — NICARDIPINE HCL IN NACL 40-0.83 MG/200ML-% IV SOLN
3.0000 mg/h | INTRAVENOUS | Status: DC
  Administered 2024-03-15: 15 mg/h via INTRAVENOUS
  Administered 2024-03-15: 5 mg/h via INTRAVENOUS
  Administered 2024-03-16: 15 mg/h via INTRAVENOUS
  Administered 2024-03-16 (×2): 10 mg/h via INTRAVENOUS
  Administered 2024-03-16: 15 mg/h via INTRAVENOUS
  Administered 2024-03-16: 10 mg/h via INTRAVENOUS
  Administered 2024-03-17: 8 mg/h via INTRAVENOUS
  Filled 2024-03-15 (×9): qty 200

## 2024-03-15 MED ORDER — HYDROMORPHONE HCL-NACL 50-0.9 MG/50ML-% IV SOLN
1.0000 mg/h | INTRAVENOUS | Status: DC
  Administered 2024-03-15: 1 mg/h via INTRAVENOUS
  Administered 2024-03-15 – 2024-03-17 (×2): 2 mg/h via INTRAVENOUS
  Administered 2024-03-19: 1 mg/h via INTRAVENOUS
  Filled 2024-03-15 (×7): qty 50

## 2024-03-15 MED ORDER — ALBUTEROL SULFATE (2.5 MG/3ML) 0.083% IN NEBU
2.5000 mg | INHALATION_SOLUTION | RESPIRATORY_TRACT | Status: DC
  Administered 2024-03-15 – 2024-03-16 (×9): 2.5 mg via RESPIRATORY_TRACT
  Filled 2024-03-15 (×8): qty 3

## 2024-03-15 MED ORDER — ROCURONIUM BROMIDE 10 MG/ML (PF) SYRINGE
60.0000 mg | PREFILLED_SYRINGE | INTRAVENOUS | Status: DC | PRN
  Administered 2024-03-15 – 2024-03-17 (×8): 60 mg via INTRAVENOUS
  Filled 2024-03-15 (×8): qty 10

## 2024-03-15 MED ORDER — ROCURONIUM BROMIDE 10 MG/ML (PF) SYRINGE
60.0000 mg | PREFILLED_SYRINGE | Freq: Once | INTRAVENOUS | Status: AC
  Administered 2024-03-15: 60 mg via INTRAVENOUS
  Filled 2024-03-15: qty 10

## 2024-03-15 NOTE — Progress Notes (Signed)
 Pt has struggle with breathing throughout the night. Pt is tachycardic, hypertensive, tachypnea with copious secretions. Prn dilaudid  givenx2; propofol  increased to 80mg c/hr. RT at bedside increasing FIO2 to 70% since sats has dropped to 82% on 3 different occasions. Almarie, NP at bedside to evaluate pt and she gave order to give vecoronium. Labetalol  also given for SBP 200's. After all interventions pt appears more relax and in synchrony with vent with sats 95%. See VS for reference.

## 2024-03-15 NOTE — Progress Notes (Signed)
 9272 Trach care with vigorous cleaning of inside and outside tracheostomy. Erica Gomez ties changed. Vigorous mouth care done as well.Given prn Labetalol  10 mg IVP and dilaudid  1 mg IVP. Patient asynchronous with ventilator. 0900 Husband in complaining about patient's mouth care. Also complaining to nurse that patient's head position is not appropriate. Very adimant that he knows what is best for his wife.. Nurse attempted to explain mouth care routine to husband but he continued to yell at nurse.and verbally threaten the nurse. Husband stated You will have an understanding before I leave here today of how you will treat my wife! 1000 Ventilator asyndrony still present-reported to MD Orders received 1016 Dilaudid  drip started per orders 1032 Patient remain asynchronous with ventilator.Extreme use of accessory muscles noted. Paralyzed with Rocuronium  60 mg per orders  1037 Patient calming.Patient's work of breathing lessening. 1200 Patient in less distress than earlier. 1421 Asynchronus with ventilator. Given prn Rocuronium .

## 2024-03-15 NOTE — Plan of Care (Signed)
" °  Problem: Education: Goal: Knowledge of General Education information will improve Description: Including pain rating scale, medication(s)/side effects and non-pharmacologic comfort measures Outcome: Not Progressing   Problem: Health Behavior/Discharge Planning: Goal: Ability to manage health-related needs will improve Outcome: Not Progressing   Problem: Clinical Measurements: Goal: Ability to maintain clinical measurements within normal limits will improve Outcome: Not Progressing Goal: Will remain free from infection Outcome: Not Progressing Goal: Diagnostic test results will improve Outcome: Not Progressing Goal: Respiratory complications will improve Outcome: Not Progressing Goal: Cardiovascular complication will be avoided Outcome: Progressing   Problem: Activity: Goal: Risk for activity intolerance will decrease Outcome: Not Progressing   Problem: Nutrition: Goal: Adequate nutrition will be maintained Outcome: Progressing   Problem: Coping: Goal: Level of anxiety will decrease Outcome: Not Progressing   Problem: Elimination: Goal: Will not experience complications related to bowel motility Outcome: Progressing Goal: Will not experience complications related to urinary retention Outcome: Progressing   Problem: Pain Managment: Goal: General experience of comfort will improve and/or be controlled Outcome: Not Progressing   Problem: Safety: Goal: Ability to remain free from injury will improve Outcome: Progressing   Problem: Skin Integrity: Goal: Risk for impaired skin integrity will decrease Outcome: Progressing   Problem: Education: Goal: Ability to describe self-care measures that may prevent or decrease complications (Diabetes Survival Skills Education) will improve Outcome: Not Progressing Goal: Individualized Educational Video(s) Outcome: Not Progressing   Problem: Coping: Goal: Ability to adjust to condition or change in health will  improve Outcome: Not Progressing   Problem: Fluid Volume: Goal: Ability to maintain a balanced intake and output will improve Outcome: Progressing   Problem: Health Behavior/Discharge Planning: Goal: Ability to identify and utilize available resources and services will improve Outcome: Not Progressing Goal: Ability to manage health-related needs will improve Outcome: Not Progressing   Problem: Metabolic: Goal: Ability to maintain appropriate glucose levels will improve Outcome: Progressing   Problem: Nutritional: Goal: Maintenance of adequate nutrition will improve Outcome: Progressing Goal: Progress toward achieving an optimal weight will improve Outcome: Not Progressing   Problem: Skin Integrity: Goal: Risk for impaired skin integrity will decrease Outcome: Progressing   Problem: Tissue Perfusion: Goal: Adequacy of tissue perfusion will improve Outcome: Progressing   Problem: Activity: Goal: Ability to tolerate increased activity will improve Outcome: Not Progressing   Problem: Respiratory: Goal: Ability to maintain a clear airway and adequate ventilation will improve Outcome: Not Progressing   Problem: Role Relationship: Goal: Method of communication will improve Outcome: Not Progressing   Problem: Activity: Goal: Ability to tolerate increased activity will improve Outcome: Not Progressing   Problem: Clinical Measurements: Goal: Ability to maintain a body temperature in the normal range will improve Outcome: Not Progressing   Problem: Respiratory: Goal: Ability to maintain adequate ventilation will improve Outcome: Not Progressing Goal: Ability to maintain a clear airway will improve Outcome: Not Progressing   "

## 2024-03-15 NOTE — Progress Notes (Signed)
 PHARMACY CONSULT NOTE - ELECTROLYTES  Pharmacy Consult for Electrolyte Monitoring and Replacement   Recent Labs: Height: 5' 6 (167.6 cm) Weight: 74.5 kg (164 lb 3.9 oz) IBW/kg (Calculated) : 59.3 Estimated Creatinine Clearance: 79.1 mL/min (by C-G formula based on SCr of 0.78 mg/dL).  Potassium (mmol/L)  Date Value  03/15/2024 4.2  11/12/2013 4.1   Magnesium  (mg/dL)  Date Value  98/68/7973 2.1   Calcium  (mg/dL)  Date Value  98/68/7973 9.5   Calcium , Total (mg/dL)  Date Value  90/69/7984 9.0   Albumin (g/dL)  Date Value  98/68/7973 3.1 (L)  05/12/2020 4.5  11/12/2013 3.5   Phosphorus (mg/dL)  Date Value  98/68/7973 3.4  03/15/2024 3.4   Sodium (mmol/L)  Date Value  03/15/2024 145  12/06/2022 139  11/12/2013 141   Assessment  Erica Gomez is a 59 y.o. female presenting with respiratory failure. PMH significant for COPD, asthma, CAD, seizures, and bipolar disorder. Pt has been hyperkalemic up to 6.1 on 1/17. Pharmacy has been consulted to monitor and replace electrolytes.  Diet: Tube feeds 55 mL/hr + free water  30mL Q4H  Goal of Therapy: Electrolytes WNL  Plan:  --No electrolyte replacement indicated at this time --Follow-up electrolytes with AM labs tomorrow  Thank you for allowing pharmacy to be a part of this patient's care.  [name] 03/15/2024 7:15 AM

## 2024-03-15 NOTE — Progress Notes (Signed)
 "  NAME:  Erica Gomez, MRN:  [id number], DOB:  15-Dec-1965, LOS: 15 ADMISSION DATE:  02/29/2024, CONSULTATION DATE:  02/29/24 REFERRING MD:  Dr. Willo, CHIEF COMPLAINT:  Acute Respiratory Distress    Brief Pt Description / Synopsis:  59 y.o. female admitted with Acute Hypoxic and Hypercapnic Respiratory Failure due to Strep Pneumoniae Pneumonia, severe COPD/Asthma Exacerbation requiring intubation and mechanical ventilation.  With failure to wean from vent, ultimately requiring Tracheostomy placement on 03/12/24.  History of Present Illness:  59 y/o F with PMH sig for asthma, COPD, tobacco use disorder, bipolar disorder, seizure, polysubstance abuse, anxiety/depression, and HLD presented to ED BIB [facility] from home for acute respiratory distress.   On Chart review, patient follows with pulmonologist; last visit with Dr. Isadora on 10/25. Meds prescribed at visit included fluticasone  furoate (Arnuity Ellipta ) 200 mcg 1 puff daily and umeclidinium-vilanterol (Anoro Ellipta ) 62.5/25 mcg 1 puff daily. Per husband, patient has been taking medications as prescribed. EMS tx included MgSO? 2 g IV, Solu-Medrol  125 mg IV, DuoNeb x1, and albuterol  x1. During transport, pt became combative/confused and received Haldol 4 mg IM and Versed  5 mg IM. Initially A&O with EMS. SpO? 94% on RA.   ED Course: Initial VS: BP 211/120, HR 142, SpO? 94% on RA. Patient was unresponsive with labored/heaving respirations and in severe resp distress. Concern for hypercapnic resp failure contributing to agitation and AMS. Pt subsequently lost ability to protect airway. Given worsening MS and resp failure, decision made for emergent intubation. Pt received ketamine  and rocuronium  and was intubated successfully without complication.   Labs/Imaging: Labs: WBC 10.7, Hgb 12.7, Hct 40.9, Plt 313. BMP: Na 141, K 4.9, Cl 106, CO? 25, BUN 14, Cr 0.81, Glu 147, Ca 8.9. LFTs WNL: AST 23, ALT 20, Alk Phos 90, Tbili <0.2, Alb 4.6. VBG c/w resp  acidosis/hypercapnia (HCO? 29.8), O? sat 98.3. Lactate 1.7. CXR: no acute cardiopulm process; ETT in mid trachea, enteric tube in stomach. CT head ordered and pending for AMS. PCCM asked to admit for further workup and treatment.  Please see Significant Hospital Events section below for full detailed hospital course.   Pertinent  Medical History   Past Medical History:  Diagnosis Date   Allergy    Anxiety    Asthma    Bilateral carpal tunnel syndrome 12/31/2018   Bipolar affective, manic (HCC) 1996   COPD (chronic obstructive pulmonary disease) (HCC)    Depression    HLD (hyperlipidemia) 03/12/2018   Lung nodule, multiple 09/26/2016   Manic depressive illness (HCC) 1996   Seizures (HCC)    Substance abuse (HCC)    Tobacco abuse 10/04/2016    Micro Data:  02/29/24 SARS-CoV-2 PCR ? negative 02/29/24 Flu PCR ? negative 02/29/24 RVP ? negative 02/29/24 BCx2 ? no growth 02/29/24 MRSA PCR ? negative 02/29/24 Strep pneumo Ur Ag + 02/29/24 Legionella Ur Ag ?  negative 02/29/24: Respiratory viral panel>> negative 03/01/24: Tracheal aspirate>>normal flora 03/05/24: Repeat tracheal aspirate>>normal flora  03/14/24: Blood x2>>negative  03/14/24: MRSA PCR>>positive  03/14/24: Resp>>abundant gram positive cocci   Antimicrobials:   Anti-infectives (From admission, onward)    Start     Dose/Rate Route Frequency Ordered Stop   03/15/24 2200  vancomycin  (VANCOREADY) IVPB 1750 mg/350 mL        1,750 mg 175 mL/hr over 120 Minutes Intravenous Every 24 hours 03/14/24 1927     03/14/24 2030  vancomycin  (VANCOREADY) IVPB 1750 mg/350 mL        1,750 mg 175 mL/hr  over 120 Minutes Intravenous  Once 03/14/24 1925 03/14/24 2311   03/14/24 2000  piperacillin -tazobactam (ZOSYN ) IVPB 3.375 g        3.375 g 12.5 mL/hr over 240 Minutes Intravenous Every 8 hours 03/14/24 1921     03/09/24 0800  ceFEPIme  (MAXIPIME ) 2 g in sodium chloride  0.9 % 100 mL IVPB        2 g 200 mL/hr over 30 Minutes  Intravenous Every 8 hours 03/09/24 0635 03/11/24 0925   03/08/24 2015  ceFEPIme  (MAXIPIME ) 2 g in sodium chloride  0.9 % 100 mL IVPB  Status:  Discontinued        2 g 200 mL/hr over 30 Minutes Intravenous Every 12 hours 03/08/24 1205 03/09/24 0635   03/06/24 0000  ceFEPIme  (MAXIPIME ) 2 g in sodium chloride  0.9 % 100 mL IVPB  Status:  Discontinued        2 g 200 mL/hr over 30 Minutes Intravenous Every 8 hours 03/05/24 2329 03/08/24 1205   03/05/24 0200  cefTRIAXone  (ROCEPHIN ) 2 g in sodium chloride  0.9 % 100 mL IVPB  Status:  Discontinued        2 g 200 mL/hr over 30 Minutes Intravenous Every 24 hours 03/05/24 0050 03/05/24 2330   03/01/24 1000  doxycycline  (VIBRAMYCIN ) 100 mg in sodium chloride  0.9 % 250 mL IVPB  Status:  Discontinued        100 mg 125 mL/hr over 120 Minutes Intravenous Every 12 hours 03/01/24 0826 03/01/24 1003   03/01/24 0200  cefTRIAXone  (ROCEPHIN ) 1 g in sodium chloride  0.9 % 100 mL IVPB  Status:  Discontinued        1 g 200 mL/hr over 30 Minutes Intravenous Every 24 hours 03/01/24 0041 03/05/24 0050       Significant Hospital Events: Including procedures, antibiotic start and stop dates in addition to other pertinent events   1/16: Admitted to ICU for asthma/COPD overlap exacerbation with hypercapnic respiratory failure requiring emergent intubation. 03/01/24- patient failed SBT today, remains on mV. She has AKI.   Strep pneumoniae +, will continue Rocephin .  Husband at bedside I met with him and we reviewed medical plan.  03/02/24- patient had resp distress overnight.  She still has AKI but there is an interval improvement.  03/03/24- patient for SBT today.  AKI has improved this am.  She requires heavy sedation. She has family coming in this afternoon who wishes to be present for SBT. 03/04/24- patient passed SBT and was extubated but after 1 hour started to have respiratory distress with severe hypoxemia. Her daughter was present and requested re-intubation. We continue  to treat her underlying Asthma COPD exacerbation with strep pneumoniae respiratory infection.  03/05/24- Patient failed SBT today with tachypnea/tachycardia/resp distress 03/06/24- Patient failed SBT and husband was present during evaluation, she was tachypneic and tachycardic. 03/07/24- Asynchronous with vent, with obstructive process/bronchospasm.  Steroids increased to 80 mg BID, Ketamine  gtt started, place A-line for frequent ABG's. 03/08/24 - paralyzed with worsening hypercapnia. Not a candidate for ECMO. Ketamine  increased to 3 mg/hour.  03/10/24- Overnight pt became hypoxic with O2 sats decreasing to 60% requiring 10 mg of iv vecuronium  x1 dose.  Due to inability to liberate from ventilator ENT consulted for possible tracheostomy placement.  CTA Chest obtained which was negative for acute PE or aortic dissection but suspicious for multifocal pneumonia  03/11/24: Remains mechanically intubated.  Overnight required vecuronium  x1 dose due to vent dyssynchrony.  Husband at bedside currently performing WUA and SBT if she fails will  need tracheostomy which is scheduled for 01/28  03/12/24:  No acute events overnight, hemodynamically stable, no vasopressors. Tracheostomy performed by ENT. 03/13/24: No acute events overnight.  Plan for WUA/SBT as tolerated.  Hypertensive with WUA requiring Nicardipine  gtt.  With low grade fever, remove foley, no change in secretions.  Wean steroids to 40 mg daily. 03/14/24: Pt febrile and with worsening leukocytosis vancomycin  and zosyn  started  03/15/24: Pt remains mechanically intubated with vent dyssynchrony and high peak pressures in severe respiratory distress despite propofol  gtt requiring addition of dilaudid  gtt and vecuronium  x1 dose   Interim History / Subjective:  As outlined above under Significant Hospital Events section  Objective   Blood pressure (!) 172/73, pulse 90, temperature 100.2 F (37.9 C), resp. rate (!) 21, height 5' 6 (1.676 m), weight 74.5 kg,  SpO2 95%.    Vent Mode: PRVC FiO2 (%):  [40 %-70 %] 60 % Set Rate:  [20 bmp] 20 bmp Vt Set:  [420 mL] 420 mL PEEP:  [5 cmH20-8 cmH20] 5 cmH20   Intake/Output Summary (Last 24 hours) at 03/15/2024 1140 Last data filed at 03/15/2024 9068 Gross per 24 hour  Intake 4265.05 ml  Output 3800 ml  Net 465.05 ml   Filed Weights   03/09/24 0407 03/10/24 0420  Weight: 74.5 kg 74.5 kg    Examination: General: Acutely ill-appearing female, lying in bed, sedated, on MV via tracheostomy HENT: Atraumatic, normocephalic, neck supple, no JVD, Tracheostomy midline clean and intact Lungs: Faint expiratory wheezes throughout, dyssynchronous with accessory muscle use   Cardiovascular: Sinus rhythm, Regular rhythm, S1-S2, no murmurs, rubs, gallops, 2+ radial/2+ distal pulses/trace generalized edema  Abdomen: +BS x4, soft, nontender, non distended Extremities: Normal bulk and tone, no deformities Neuro: Sedated, withdraws from pain, currently not following commands, PERRL GU: External catheter draining yellow urine   Resolved Hospital Problem list     Assessment & Plan:   #Acute Metabolic Encephalopathy #Sedation needs in setting of mechanical ventilation PMHx: Seizure disorder, anxiety, depression, bipolar disorder, polysubstance abuse  CT Head 01/26: Normal head CT. No acute intracranial abnormality.  - Maintain a RASS goal of 0 to -1 - PAD protocol to maintain RASS goal: propofol  and dilaudid  gtt  - Avoid sedating medications as able - Daily wake up assessment - Continue home lamictal  100 mg daily - Continue oxycodone  via NGT  #HTN Hx: HLD  - Continuous telemetry monitoring  - Continue scheduled amlodipine  - Prn iv push labetalol /hydralazine  and/or nicardipine  gtt for bp management   #Acute Hypoxic & Hypercapnic Respiratory Failure due to ... #Strep Pneumoniae Pneumonia #COPD/Asthma Exacerbation  PMHx: Tobacco use  CTA Chest 03/10/24:  Negative for acute pulmonary embolus or aortic  dissection. Bilateral lower lobe partial consolidations with diffuse bilateral peribronchovascular mild ground-glass disease and small nodules, findings are suspicious for multifocal pneumonia. Imaging follow-up to resolution is recommended Multi vessel coronary vascular calcification. - Status post Tracheostomy 03/12/24 - Full vent support, implement lung protective strategies - Plateau pressures less than 30 cm H20 - Wean FiO2 & PEEP as tolerated to maintain O2 sats 88 to 92% - Follow intermittent Chest X-ray & ABG as needed - Spontaneous Breathing Trials when respiratory parameters met and mental status permits - Implement VAP Bundle - Increased frequency of bronchodilators from q4hrs to q3hrs and prn bronchodilator therapy  - Prednisone  taper   #Strep Pneumoniae Pneumonia~TREATED  #Leukocytosis~worsening  - Trend WBC's and monitor fever curve  - Follow cultures as above - Continue zosyn  and vancomycin  pending culture results  and sensitivities   #Anemia without signs of active bleeding  - Trend CBC  - Monitor for s/sx of bleeding  - Transfuse for hgb <7  #Hyperglycemia - CBG's q4h; Target range of 140 to 180 - SSI - Follow ICU Hypo/Hyperglycemia protocol   Best Practice (right click and Reselect all SmartList Selections daily)   Diet/type: tubefeeds and NPO DVT prophylaxis: SCD's GI prophylaxis: PPI Lines: N/A Foley:  N/A Code Status:  full code Last date of multidisciplinary goals of care discussion [03/15/24]  1/31: Pts spouse updated at bedside multiple times regarding pts condition and current plan of care.  All questions answered  Labs   CBC: Recent Labs  Lab 03/10/24 1108 03/11/24 0404 03/12/24 0419 03/13/24 0517 03/14/24 0045 03/15/24 0245  WBC 16.8* 17.9* 17.4* 20.3* 18.7* 23.1*  NEUTROABS 14.7*  --   --   --   --   --   HGB 10.4* 9.3* 9.3* 10.5* 10.5* 10.8*  HCT 33.0* 30.0* 29.6* 33.7* 32.8* 33.4*  MCV 92.7 96.2 93.4 91.8 90.6 89.5  PLT 330 289 284  358 354 366    Basic Metabolic Panel: Recent Labs  Lab 03/11/24 0404 03/12/24 0419 03/13/24 0517 03/14/24 0045 03/14/24 1022 03/14/24 2054 03/15/24 0245  NA 144 144 144 144 141 143 145  K 4.8 4.6 4.3 3.8 3.6 4.3 4.2  CL 110 111 107 109 108 108 109  CO2 27 27 26 25 22 24 24   GLUCOSE 125* 123* 100* 152* 177* 262* 152*  BUN 35* 35* 31* 26* 27* 27* 26*  CREATININE 0.70 0.72 0.73 0.71 0.79 0.83 0.78  CALCIUM  9.5 9.4 10.0 9.2 9.1 9.2 9.5  MG 2.1 2.1 1.9 2.0  --   --  2.1  PHOS 3.1 3.3 1.8*  2.0* 3.3  --   --  3.4  3.4   GFR: Estimated Creatinine Clearance: 79.1 mL/min (by C-G formula based on SCr of 0.78 mg/dL). Recent Labs  Lab 03/12/24 0419 03/13/24 0517 03/14/24 0045 03/15/24 0245  WBC 17.4* 20.3* 18.7* 23.1*    Liver Function Tests: Recent Labs  Lab 03/13/24 0517 03/14/24 0045 03/15/24 0245  ALBUMIN 3.2* 3.2* 3.1*   No results for input(s): LIPASE, AMYLASE in the last 168 hours. No results for input(s): AMMONIA in the last 168 hours.  ABG    Component Value Date/Time   PHART 7.51 (H) 03/13/2024 1427   PCO2ART 35 03/13/2024 1427   PO2ART 73 (L) 03/13/2024 1427   HCO3 27.9 03/13/2024 1427   ACIDBASEDEF 0.3 03/09/2024 1644   O2SAT 97.6 03/13/2024 1427     Coagulation Profile: No results for input(s): INR, PROTIME in the last 168 hours.  Cardiac Enzymes: Recent Labs  Lab 03/09/24 1644  CKTOTAL 93    HbA1C: Hemoglobin A1C  Date/Time Value Ref Range Status  09/21/2021 02:00 PM 6.4 (A) 4.0 - 5.6 % Final  03/22/2021 09:52 AM 6.2 (A) 4.0 - 5.6 % Final  01/15/2014 12:00 AM 6.8  Final   HbA1c, POC (prediabetic range)  Date/Time Value Ref Range Status  05/11/2020 10:30 AM 0 (A) 5.7 - 6.4 % Final   HbA1c, POC (controlled diabetic range)  Date/Time Value Ref Range Status  05/11/2020 10:30 AM 0.0 0.0 - 7.0 % Final   HbA1c POC (<> result, manual entry)  Date/Time Value Ref Range Status  05/11/2020 10:30 AM 6.5 4.0 - 5.6 % Final   Hgb A1c  MFr Bld  Date/Time Value Ref Range Status  03/01/2024 02:02 AM 6.4 (H) 4.8 -  5.6 % Final    Comment:    (NOTE) Diagnosis of Diabetes The following HbA1c ranges recommended by the American Diabetes Association (ADA) may be used as an aid in the diagnosis of diabetes mellitus.  Hemoglobin             Suggested A1C NGSP%              Diagnosis  <5.7                   Non Diabetic  5.7-6.4                Pre-Diabetic  >6.4                   Diabetic  <7.0                   Glycemic control for                       adults with diabetes.    12/24/2019 11:08 AM 6.7 (H) 4.8 - 5.6 % Final    Comment:             Prediabetes: 5.7 - 6.4          Diabetes: >6.4          Glycemic control for adults with diabetes: <7.0     CBG: Recent Labs  Lab 03/14/24 1620 03/14/24 1914 03/14/24 2311 03/15/24 0302 03/15/24 0734  GLUCAP 228* 222* 202* 126* 144*    Review of Systems:   Unable to assess due to intubation/sedation/critical illness   Past Medical History:  She,  has a past medical history of Allergy, Anxiety, Asthma, Bilateral carpal tunnel syndrome (12/31/2018), Bipolar affective, manic (HCC) (1996), COPD (chronic obstructive pulmonary disease) (HCC), Depression, HLD (hyperlipidemia) (03/12/2018), Lung nodule, multiple (09/26/2016), Manic depressive illness (HCC) (1996), Seizures (HCC), Substance abuse (HCC), and Tobacco abuse (10/04/2016).   Surgical History:   Past Surgical History:  Procedure Laterality Date   ABDOMINAL HYSTERECTOMY     CARPAL TUNNEL RELEASE Right 01/15/2019   Procedure: RIGHTCARPAL TUNNEL RELEASE, LEFT DEQUERVAINS INJECTION;  Surgeon: Jerri Kay HERO, MD;  Location: [facility];  Service: Orthopedics;  Laterality: Right;   CARPAL TUNNEL RELEASE Left 02/26/2019   Procedure: LEFT CARPAL TUNNEL RELEASE;  Surgeon: Jerri Kay HERO, MD;  Location: [facility];  Service: Orthopedics;  Laterality: Left;  Bier block   COLONOSCOPY WITH PROPOFOL   N/A 11/28/2018   Procedure: COLONOSCOPY WITH PROPOFOL ;  Surgeon: Therisa Bi, MD;  Location: [facility];  Service: Gastroenterology;  Laterality: N/A;   TRACHEOSTOMY TUBE PLACEMENT N/A 03/12/2024   Procedure: CREATION, TRACHEOSTOMY;  Surgeon: Milissa Hamming, MD;  Location: [facility]S;  Service: ENT;  Laterality: N/A;  Trach in place   TUBAL LIGATION       Social History:   reports that she has been smoking cigarettes. Her smokeless tobacco use includes chew and snuff. She reports that she does not currently use alcohol. She reports that she does not use drugs.   Family History:  Her family history includes Bipolar disorder in her brother and brother; CAD in her father; Heart attack in her father; Other in her mother; Schizophrenia in her brother and brother; Stroke in her mother.   Allergies Allergies[1]   Home Medications  Prior to Admission medications  Medication Sig Start Date End Date Taking? Authorizing Provider  albuterol  (PROVENTIL  HFA) 108 (90 Base) MCG/ACT inhaler Inhale 2  puffs into the lungs every 4-6 hours as needed. 09/21/21  Yes Iloabachie, Chioma E, NP  amphetamine-dextroamphetamine (ADDERALL XR) 10 MG 24 hr capsule Take 10 mg by mouth daily.   Yes [provider]  citalopram  (CELEXA ) 40 MG tablet Take 1 tablet (40 mg total) by mouth daily. 11/21/22  Yes   Fluticasone  Furoate (ARNUITY ELLIPTA ) 200 MCG/ACT AEPB Inhale 1 puff into the lungs daily. 12/06/23  Yes Dgayli, Belva, MD  hydrOXYzine  (ATARAX ) 50 MG tablet Take 1-1.5 tablets (50-75 mg total) by mouth at bedtime as needed for sleep 11/21/22  Yes   lamoTRIgine  (LAMICTAL ) 100 MG tablet Take 1 tablet (100 mg total) by mouth daily. 11/21/22  Yes   rosuvastatin  (CRESTOR ) 10 MG tablet Take 10 mg by mouth at bedtime. 08/13/23  Yes [provider]  traMADol (ULTRAM) 50 MG tablet Take 1 tablet by mouth every 6 (six) hours as needed.   Yes [provider]  umeclidinium-vilanterol (ANORO ELLIPTA ) 62.5-25  MCG/ACT AEPB Inhale 1 puff into the lungs daily. 12/06/23  Yes Dgayli, Belva, MD  ipratropium-albuterol  (DUONEB) 0.5-2.5 (3) MG/3ML SOLN INHALE CONTENTS OF ONE VIAL ( TOTAL) USING NEBULIZER  ONCE EVERY 6 HOURS. 11/02/20 12/06/23  Iloabachie, Chioma E, NP  atomoxetine  (STRATTERA ) 25 MG capsule Take 1 capsule (25 mg total) by mouth once every evening after dinner. Patient not taking: Reported on 12/23/2020 10/26/20 03/09/21    loratadine  (CLARITIN ) 10 MG tablet Take 1 tablet (10 mg total) by mouth daily as needed for allergies. 12/16/19 12/25/19  Iloabachie, Chioma E, NP     Critical care time: 60 minutes    Lonell Moose, AGNP  Pulmonary/Critical Care Pager [phone] (please enter 7 digits) PCCM Consult Pager [phone] (please enter 7 digits)            [1]  Allergies Allergen Reactions   Flax Seed Oil [Flax Seed Oil] Hives    Pt. Self reported   Other Hives    Pt. Self reported   Trazodone Other (See Comments)    Bed wetting  Other Reaction(s): Other (See Comments)    Bed wetting   "

## 2024-03-16 DIAGNOSIS — Z720 Tobacco use: Secondary | ICD-10-CM

## 2024-03-16 LAB — BLOOD CULTURE ID PANEL (REFLEXED) - BCID2

## 2024-03-16 LAB — RENAL FUNCTION PANEL
Albumin: 3.1 g/dL — ABNORMAL LOW (ref 3.5–5.0)
Anion gap: 12 (ref 5–15)
BUN: 39 mg/dL — ABNORMAL HIGH (ref 6–20)
CO2: 23 mmol/L (ref 22–32)
Calcium: 9.8 mg/dL (ref 8.9–10.3)
Chloride: 108 mmol/L (ref 98–111)
Creatinine, Ser: 0.98 mg/dL (ref 0.44–1.00)
GFR, Estimated: >60 mL/min
Glucose, Bld: 259 mg/dL — ABNORMAL HIGH (ref 70–99)
Phosphorus: 4.2 mg/dL (ref 2.5–4.6)
Potassium: 5 mmol/L (ref 3.5–5.1)
Sodium: 144 mmol/L (ref 135–145)

## 2024-03-16 LAB — BLOOD GAS, ARTERIAL
Acid-base deficit: 1.9 mmol/L (ref 0.0–2.0)
Bicarbonate: 25 mmol/L (ref 20.0–28.0)
FIO2: 50 %
MECHVT: 450 mL
O2 Saturation: 98.9 %
PEEP: 8 cmH2O
Patient temperature: 37
RATE: 12 {breaths}/min
pCO2 arterial: 52 mmHg — ABNORMAL HIGH (ref 32–48)
pH, Arterial: 7.29 — ABNORMAL LOW (ref 7.35–7.45)
pO2, Arterial: 147 mmHg — ABNORMAL HIGH (ref 83–108)

## 2024-03-16 LAB — MAGNESIUM: Magnesium: 2.2 mg/dL (ref 1.7–2.4)

## 2024-03-16 LAB — CBC
HCT: 33 % — ABNORMAL LOW (ref 36.0–46.0)
Hemoglobin: 10.2 g/dL — ABNORMAL LOW (ref 12.0–15.0)
MCH: 29.4 pg (ref 26.0–34.0)
MCHC: 30.9 g/dL (ref 30.0–36.0)
MCV: 95.1 fL (ref 80.0–100.0)
Platelets: 365 10*3/uL (ref 150–400)
RBC: 3.47 MIL/uL — ABNORMAL LOW (ref 3.87–5.11)
RDW: 15.1 % (ref 11.5–15.5)
WBC: 24.8 10*3/uL — ABNORMAL HIGH (ref 4.0–10.5)
nRBC: 0 % (ref 0.0–0.2)

## 2024-03-16 LAB — GLUCOSE, CAPILLARY
Glucose-Capillary: 182 mg/dL — ABNORMAL HIGH (ref 70–99)
Glucose-Capillary: 185 mg/dL — ABNORMAL HIGH (ref 70–99)
Glucose-Capillary: 205 mg/dL — ABNORMAL HIGH (ref 70–99)
Glucose-Capillary: 205 mg/dL — ABNORMAL HIGH (ref 70–99)
Glucose-Capillary: 258 mg/dL — ABNORMAL HIGH (ref 70–99)
Glucose-Capillary: 263 mg/dL — ABNORMAL HIGH (ref 70–99)

## 2024-03-16 LAB — CULTURE, RESPIRATORY W GRAM STAIN

## 2024-03-16 MED ORDER — FUROSEMIDE 10 MG/ML IJ SOLN
40.0000 mg | Freq: Once | INTRAMUSCULAR | Status: AC
  Administered 2024-03-16: 40 mg via INTRAVENOUS
  Filled 2024-03-16: qty 4

## 2024-03-16 MED ORDER — ALBUTEROL SULFATE (2.5 MG/3ML) 0.083% IN NEBU
2.5000 mg | INHALATION_SOLUTION | RESPIRATORY_TRACT | Status: DC
  Administered 2024-03-16 – 2024-03-19 (×33): 2.5 mg via RESPIRATORY_TRACT
  Filled 2024-03-16 (×31): qty 3

## 2024-03-16 NOTE — Progress Notes (Signed)
 PHARMACY - PHYSICIAN COMMUNICATION CRITICAL VALUE ALERT - BLOOD CULTURE IDENTIFICATION (BCID)  Erica Gomez is an 59 y.o. female who presented to [facility] on 02/29/2024 with a chief complaint of respiratory failure.    Assessment:  Staph epi in 1 of 3 bottles, no resistance.  Most likely contaminant.  (include suspected source if known)  Name of physician (or Provider) Contacted: Almarie Nose, NP   Current antibiotics: Vanc and Zosyn    Changes to prescribed antibiotics recommended:  Patient is on recommended antibiotics - No changes needed  Results for orders placed or performed during the hospital encounter of 02/29/24  Blood Culture ID Panel (Reflexed) (Collected: 03/14/2024 10:20 AM)  Result Value Ref Range   Enterococcus faecalis NOT DETECTED NOT DETECTED   Enterococcus Faecium NOT DETECTED NOT DETECTED   Listeria monocytogenes NOT DETECTED NOT DETECTED   Staphylococcus species DETECTED (A) NOT DETECTED   Staphylococcus aureus (BCID) NOT DETECTED NOT DETECTED   Staphylococcus epidermidis DETECTED (A) NOT DETECTED   Staphylococcus lugdunensis NOT DETECTED NOT DETECTED   Streptococcus species NOT DETECTED NOT DETECTED   Streptococcus agalactiae NOT DETECTED NOT DETECTED   Streptococcus pneumoniae NOT DETECTED NOT DETECTED   Streptococcus pyogenes NOT DETECTED NOT DETECTED   A.calcoaceticus-baumannii NOT DETECTED NOT DETECTED   Bacteroides fragilis NOT DETECTED NOT DETECTED   Enterobacterales NOT DETECTED NOT DETECTED   Enterobacter cloacae complex NOT DETECTED NOT DETECTED   Escherichia coli NOT DETECTED NOT DETECTED   Klebsiella aerogenes NOT DETECTED NOT DETECTED   Klebsiella oxytoca NOT DETECTED NOT DETECTED   Klebsiella pneumoniae NOT DETECTED NOT DETECTED   Proteus species NOT DETECTED NOT DETECTED   Salmonella species NOT DETECTED NOT DETECTED   Serratia marcescens NOT DETECTED NOT DETECTED   Haemophilus influenzae NOT DETECTED NOT DETECTED   Neisseria  meningitidis NOT DETECTED NOT DETECTED   Pseudomonas aeruginosa NOT DETECTED NOT DETECTED   Stenotrophomonas maltophilia NOT DETECTED NOT DETECTED   Candida albicans NOT DETECTED NOT DETECTED   Candida auris NOT DETECTED NOT DETECTED   Candida glabrata NOT DETECTED NOT DETECTED   Candida krusei NOT DETECTED NOT DETECTED   Candida parapsilosis NOT DETECTED NOT DETECTED   Candida tropicalis NOT DETECTED NOT DETECTED   Cryptococcus neoformans/gattii NOT DETECTED NOT DETECTED   Methicillin resistance mecA/C NOT DETECTED NOT DETECTED    Braelen Sproule D 03/16/2024  3:17 AM

## 2024-03-16 NOTE — Progress Notes (Signed)
 "  NAME:  Erica Gomez, MRN:  [id number], DOB:  03/10/1965, LOS: 16 ADMISSION DATE:  02/29/2024 History of Present Illness:  59 y/o F with PMH sig for asthma, COPD, tobacco use disorder, bipolar disorder, seizure, polysubstance abuse, anxiety/depression, and HLD presented to ED BIB [facility] from home for acute respiratory distress.   On Chart review, patient follows with pulmonologist; last visit with Dr. Isadora on 10/25. Meds prescribed at visit included fluticasone  furoate (Arnuity Ellipta ) 200 mcg 1 puff daily and umeclidinium-vilanterol (Anoro Ellipta ) 62.5/25 mcg 1 puff daily. Per husband, patient has been taking medications as prescribed. EMS tx included MgSO? 2 g IV, Solu-Medrol  125 mg IV, DuoNeb x1, and albuterol  x1. During transport, pt became combative/confused and received Haldol 4 mg IM and Versed  5 mg IM. Initially A&O with EMS. SpO? 94% on RA.   ED Course: Initial VS: BP 211/120, HR 142, SpO? 94% on RA. Patient was unresponsive with labored/heaving respirations and in severe resp distress. Concern for hypercapnic resp failure contributing to agitation and AMS. Pt subsequently lost ability to protect airway. Given worsening MS and resp failure, decision made for emergent intubation. Pt received ketamine  and rocuronium  and was intubated successfully without complication.   Labs/Imaging: Labs: WBC 10.7, Hgb 12.7, Hct 40.9, Plt 313. BMP: Na 141, K 4.9, Cl 106, CO? 25, BUN 14, Cr 0.81, Glu 147, Ca 8.9. LFTs WNL: AST 23, ALT 20, Alk Phos 90, Tbili <0.2, Alb 4.6. VBG c/w resp acidosis/hypercapnia (HCO? 29.8), O? sat 98.3. Lactate 1.7. CXR: no acute cardiopulm process; ETT in mid trachea, enteric tube in stomach. CT head ordered and pending for AMS. PCCM asked to admit for further workup and treatment.   Please see Significant Hospital Events section below for full detailed hospital course.  Significant Hospital Events: Including procedures, antibiotic start and stop dates in addition to other  pertinent events   1/16: Admitted to ICU for asthma/COPD overlap exacerbation with hypercapnic respiratory failure requiring emergent intubation. 03/01/24- patient failed SBT today, remains on mV. She has AKI.   Strep pneumoniae +, will continue Rocephin .  Husband at bedside I met with him and we reviewed medical plan.  03/02/24- patient had resp distress overnight.  She still has AKI but there is an interval improvement.  03/03/24- patient for SBT today.  AKI has improved this am.  She requires heavy sedation. She has family coming in this afternoon who wishes to be present for SBT. 03/04/24- patient passed SBT and was extubated but after 1 hour started to have respiratory distress with severe hypoxemia. Her daughter was present and requested re-intubation. We continue to treat her underlying Asthma COPD exacerbation with strep pneumoniae respiratory infection.  03/05/24- Patient failed SBT today with tachypnea/tachycardia/resp distress 03/06/24- Patient failed SBT and husband was present during evaluation, she was tachypneic and tachycardic. 03/07/24- Asynchronous with vent, with obstructive process/bronchospasm.  Steroids increased to 80 mg BID, Ketamine  gtt started, place A-line for frequent ABG's. 03/08/24 - paralyzed with worsening hypercapnia. Not a candidate for ECMO. Ketamine  increased to 3 mg/hour.  03/10/24- Overnight pt became hypoxic with O2 sats decreasing to 60% requiring 10 mg of iv vecuronium  x1 dose.  Due to inability to liberate from ventilator ENT consulted for possible tracheostomy placement.  CTA Chest obtained which was negative for acute PE or aortic dissection but suspicious for multifocal pneumonia  03/11/24: Remains mechanically intubated.  Overnight required vecuronium  x1 dose due to vent dyssynchrony.  Husband at bedside currently performing WUA and SBT if she fails  will need tracheostomy which is scheduled for 01/28  03/12/24:  No acute events overnight, hemodynamically stable, no  vasopressors. Tracheostomy performed by ENT. 03/13/24: No acute events overnight.  Plan for WUA/SBT as tolerated.  Hypertensive with WUA requiring Nicardipine  gtt.  With low grade fever, remove foley, no change in secretions.  Wean steroids to 40 mg daily. 03/14/24: Pt febrile and with worsening leukocytosis vancomycin  and zosyn  started  03/15/24: Pt remains mechanically intubated with vent dyssynchrony and high peak pressures in severe respiratory distress despite propofol  gtt requiring addition of dilaudid  gtt and vecuronium  x1 dose   Interim History / Subjective:  -- Patient with respiratory distress this am and extremely dyssynchronus with the vent. Significantly prolonged expiratory phase.  -- New VAP - Sputum with MRSA.   Objective    Blood pressure (!) 149/67, pulse (!) 109, temperature 99.1 F (37.3 C), resp. rate 11, height 5' 6 (1.676 m), weight 74.5 kg, SpO2 95%.    Vent Mode: PRVC FiO2 (%):  [50 %-55 %] 55 % Set Rate:  [18 bmp] 18 bmp Vt Set:  [420 mL] 420 mL PEEP:  [5 cmH20] 5 cmH20   Intake/Output Summary (Last 24 hours) at 03/16/2024 1317 Last data filed at 03/16/2024 1020 Gross per 24 hour  Intake 4559.22 ml  Output 2300 ml  Net 2259.22 ml   Filed Weights   03/09/24 0407 03/10/24 0420 03/16/24 0500  Weight: 74.5 kg 74.5 kg 74.5 kg    Examination: General: In distress, intubated and deeply sedated.  HEENT: [place]/AT, moist mucous membranes, sclera anicteric Neuro: A&O x 3, moving all extremities CV: rrr, s1s2, no murmurs PULM: diffuse wheezing, prolonged expiratory phase.  GI: soft, non-tender, non-distended, BS+ Extremities: warm, no edema   Assessment and Plan  #Acute Hypoxic and Hypercapnic Respiratory Failure #Status Asthmaticus #COPD/Asthma overlap with what appears to be poor baseline to begin with.  #S. Pneumo Pneumonia #Now with VAP with MRSA pneumonia  #Toxic Metabolic Encephalopathy #Tobacco use.    Neurology : Propofol  from RASS -3 to -4. Dilaudid   drip. Intermittent paralysis. dc Oxycodone  10mg  PO Q8H scheduled for now. Lamotrigine  100mg  per tube daily.   Cardiovascular - Hypertensive as we are weaning her sedation. Started Nicardipine  for BP control. Nicardipine  Drip. Amlodipine  10. Hydralazine  and labetalol  PRN.  Pulmonary - Status asthmaticus evidence by prolonged expiratory phase over the past week. This has improved drastically with high dose steroids, nebulizers, and brief paralytics. Her gas exchange is also improved. Given prolonged intubation and need for continued mechanical ventilation, she underwent tracheostomy tube placement which she tolerated well. Will continue with standing nebs, and attempt PSV today.  -continue steroids, IV methylpred 40mg  daily.               -CT/PE -ve for PE             - Albuterol  neb q2H Scheduled             - Back on the Vent. Peak 28. Maintain a low resp rate of < 15 to allow for a prolong exp phase and risk of autopeep. PH < 7.2.   Gastrointestinal - continue tube feeds, PPI for SUP. Will need PEG, IR consulted  Renal - kidney function is improved further.   Endocrine - ICU glycemic protocol  Hem/Onc - DVT prophylaxis with SubQ heparin   ID - Received Empiric Cefipime for 7 days for management of pneumonia. Now with MRSA Pneumonia             -  Vancomycin .   Other - family (husband and daughter) updated at the bedside today. Explained the role of tracheostomy in weaning her off the ventilator, and the next steps in therapy in terms of ventilator weaning. Also discussed need for PEG tube to facilitate feeding. Explained that most patients will have both PEG and trach removed with improvement in her underlying condition.  Critical care time: 60 minutes     Darrin Barn, MD [facility] 03/16/2024 1:28 PM    "

## 2024-03-16 NOTE — Progress Notes (Addendum)
 PHARMACY CONSULT NOTE - ELECTROLYTES  Pharmacy Consult for Electrolyte Monitoring and Replacement   Recent Labs: Height: 5' 6 (167.6 cm) Weight: 74.5 kg (164 lb 3.9 oz) (unable to weigh) IBW/kg (Calculated) : 59.3 Estimated Creatinine Clearance: 64.6 mL/min (by C-G formula based on SCr of 0.98 mg/dL).  Potassium (mmol/L)  Date Value  03/16/2024 5.0  11/12/2013 4.1   Magnesium  (mg/dL)  Date Value  97/98/7973 2.2   Calcium  (mg/dL)  Date Value  97/98/7973 9.8   Calcium , Total (mg/dL)  Date Value  90/69/7984 9.0   Albumin (g/dL)  Date Value  97/98/7973 3.1 (L)  05/12/2020 4.5  11/12/2013 3.5   Phosphorus (mg/dL)  Date Value  97/98/7973 4.2   Sodium (mmol/L)  Date Value  03/16/2024 144  12/06/2022 139  11/12/2013 141   Assessment  Erica Gomez is a 59 y.o. female presenting with respiratory failure. PMH significant for COPD, asthma, CAD, seizures, and bipolar disorder. Pt has been hyperkalemic up to 6.1 on 1/17. Pharmacy has been consulted to monitor and replace electrolytes.  Diet: Tube feeds 55 mL/hr + free water  30mL Q4H  Diuretics: furosemide  40 mg IV x 1  Goal of Therapy: Electrolytes WNL  Plan:  --No electrolyte replacement indicated at this time --Follow-up electrolytes with AM labs tomorrow  Thank you for allowing pharmacy to be a part of this patient's care.  [name] 03/16/2024 7:24 AM

## 2024-03-16 NOTE — Plan of Care (Signed)
 Patient remains ventilator-dependent with tracheostomy. RASS of -3/-4 while on propofol  and dilaudid ; not following commands, no tracking and no purposeful movement. Patient is very asynchronous on ventilator, with irregular breathing, and accessory muscle use. Oxygen saturation dropping into the 70's during big turns and patient needs to be bagged to bring SATs up. PRN rocuronium  given to help with ventilator synchrony. Foley, PIVs, and art line intact.   Problem: Respiratory: Goal: Ability to maintain adequate ventilation will improve Outcome: Not Progressing Goal: Ability to maintain a clear airway will improve Outcome: Not Progressing   Problem: Clinical Measurements: Goal: Ability to maintain a body temperature in the normal range will improve Outcome: Progressing   Problem: Activity: Goal: Ability to tolerate increased activity will improve Outcome: Not Progressing   Problem: Nutritional: Goal: Maintenance of adequate nutrition will improve Outcome: Progressing Goal: Progress toward achieving an optimal weight will improve Outcome: Progressing   Problem: Skin Integrity: Goal: Risk for impaired skin integrity will decrease Outcome: Progressing

## 2024-03-17 LAB — RENAL FUNCTION PANEL
Albumin: 3 g/dL — ABNORMAL LOW (ref 3.5–5.0)
Anion gap: 10 (ref 5–15)
BUN: 44 mg/dL — ABNORMAL HIGH (ref 6–20)
CO2: 25 mmol/L (ref 22–32)
Calcium: 9.9 mg/dL (ref 8.9–10.3)
Chloride: 113 mmol/L — ABNORMAL HIGH (ref 98–111)
Creatinine, Ser: 0.96 mg/dL (ref 0.44–1.00)
GFR, Estimated: >60 mL/min
Glucose, Bld: 239 mg/dL — ABNORMAL HIGH (ref 70–99)
Phosphorus: 3.2 mg/dL (ref 2.5–4.6)
Potassium: 4.9 mmol/L (ref 3.5–5.1)
Sodium: 148 mmol/L — ABNORMAL HIGH (ref 135–145)

## 2024-03-17 LAB — CBC
HCT: 30 % — ABNORMAL LOW (ref 36.0–46.0)
Hemoglobin: 9.1 g/dL — ABNORMAL LOW (ref 12.0–15.0)
MCH: 29.7 pg (ref 26.0–34.0)
MCHC: 30.3 g/dL (ref 30.0–36.0)
MCV: 98 fL (ref 80.0–100.0)
Platelets: 361 10*3/uL (ref 150–400)
RBC: 3.06 MIL/uL — ABNORMAL LOW (ref 3.87–5.11)
RDW: 15.2 % (ref 11.5–15.5)
WBC: 19.2 10*3/uL — ABNORMAL HIGH (ref 4.0–10.5)
nRBC: 0 % (ref 0.0–0.2)

## 2024-03-17 LAB — GLUCOSE, CAPILLARY
Glucose-Capillary: 212 mg/dL — ABNORMAL HIGH (ref 70–99)
Glucose-Capillary: 156 mg/dL — ABNORMAL HIGH (ref 70–99)
Glucose-Capillary: 192 mg/dL — ABNORMAL HIGH (ref 70–99)
Glucose-Capillary: 215 mg/dL — ABNORMAL HIGH (ref 70–99)
Glucose-Capillary: 154 mg/dL — ABNORMAL HIGH (ref 70–99)
Glucose-Capillary: 177 mg/dL — ABNORMAL HIGH (ref 70–99)

## 2024-03-17 LAB — BLOOD GAS, ARTERIAL
Acid-Base Excess: 4.9 mmol/L — ABNORMAL HIGH (ref 0.0–2.0)
Bicarbonate: 27.9 mmol/L (ref 20.0–28.0)
O2 Saturation: 97.6 %
PEEP: 5 cmH2O
Patient temperature: 37
Pressure support: 8 cmH2O
pCO2 arterial: 35 mmHg (ref 32–48)
pH, Arterial: 7.51 — ABNORMAL HIGH (ref 7.35–7.45)
pO2, Arterial: 73 mmHg — ABNORMAL LOW (ref 83–108)

## 2024-03-17 LAB — MAGNESIUM: Magnesium: 2.4 mg/dL (ref 1.7–2.4)

## 2024-03-17 MED ORDER — LABETALOL HCL 5 MG/ML IV SOLN
10.0000 mg | INTRAVENOUS | Status: AC | PRN
  Administered 2024-03-18 (×5): 10 mg via INTRAVENOUS
  Administered 2024-03-18 – 2024-03-19 (×2): 20 mg via INTRAVENOUS
  Administered 2024-03-19 – 2024-03-20 (×2): 10 mg via INTRAVENOUS
  Filled 2024-03-17 (×7): qty 4

## 2024-03-17 MED ORDER — CLONIDINE HCL 0.1 MG PO TABS
0.1000 mg | ORAL_TABLET | Freq: Three times a day (TID) | ORAL | Status: AC
  Administered 2024-03-17 – 2024-03-21 (×13): 0.1 mg
  Filled 2024-03-17 (×13): qty 1

## 2024-03-17 MED ORDER — POLYETHYLENE GLYCOL 3350 17 G PO PACK
17.0000 g | PACK | Freq: Two times a day (BID) | ORAL | Status: AC
  Administered 2024-03-17 – 2024-03-21 (×3): 17 g
  Filled 2024-03-17 (×5): qty 1

## 2024-03-17 MED ORDER — VANCOMYCIN HCL 1500 MG/300ML IV SOLN
1500.0000 mg | INTRAVENOUS | Status: DC
  Administered 2024-03-17 – 2024-03-18 (×2): 1500 mg via INTRAVENOUS
  Filled 2024-03-17 (×2): qty 300

## 2024-03-17 MED ORDER — ROCURONIUM BROMIDE 10 MG/ML (PF) SYRINGE
60.0000 mg | PREFILLED_SYRINGE | Freq: Four times a day (QID) | INTRAVENOUS | Status: DC | PRN
  Administered 2024-03-17: 60 mg via INTRAVENOUS
  Filled 2024-03-17: qty 10

## 2024-03-17 MED ORDER — CEFAZOLIN SODIUM-DEXTROSE 2-4 GM/100ML-% IV SOLN
2.0000 g | INTRAVENOUS | Status: AC
  Filled 2024-03-17: qty 100

## 2024-03-17 NOTE — Progress Notes (Signed)
 PHARMACY CONSULT NOTE - ELECTROLYTES  Pharmacy Consult for Electrolyte Monitoring and Replacement   Recent Labs: Height: 5' 6 (167.6 cm) Weight: 74.5 kg (164 lb 3.9 oz) (unable to get accurate weight due to weight malfunction) IBW/kg (Calculated) : 59.3 Estimated Creatinine Clearance: 65.9 mL/min (by C-G formula based on SCr of 0.96 mg/dL).  Potassium (mmol/L)  Date Value  03/17/2024 4.9  11/12/2013 4.1   Magnesium  (mg/dL)  Date Value  97/97/7973 2.4   Calcium  (mg/dL)  Date Value  97/97/7973 9.9   Calcium , Total (mg/dL)  Date Value  90/69/7984 9.0   Albumin (g/dL)  Date Value  97/97/7973 3.0 (L)  05/12/2020 4.5  11/12/2013 3.5   Phosphorus (mg/dL)  Date Value  97/97/7973 3.2   Sodium (mmol/L)  Date Value  03/17/2024 148 (H)  12/06/2022 139  11/12/2013 141   Assessment  Erica Gomez is a 59 y.o. female presenting with respiratory failure. PMH significant for COPD, asthma, CAD, seizures, and bipolar disorder. Pt has been hyperkalemic up to 6.1 on 1/17. Pharmacy has been consulted to monitor and replace electrolytes.  Diet: Tube feeds 55 mL/hr + free water  30mL Q4H  Goal of Therapy: Electrolytes WNL  Plan:  --Na 148, likely secondary to intravascular depletion. Lasix  was given yesterday. Continue to monitor --Follow-up electrolytes with AM labs tomorrow  Thank you for allowing pharmacy to be a part of this patient's care.  [name] 03/17/2024 7:52 AM

## 2024-03-17 NOTE — Consult Note (Signed)
 "     Chief Complaint: Patient was seen in consultation today for PEG placement   Referring Physician(s): Dr. Belva November  Supervising Physician: Hughes Simmonds  Patient Status: [facility] - In-pt  History of Present Illness: [facility] is a 59 y.o. female with a past medical history of asthma, COPD, bipolar disorder, seizures, polysubstance use, who presented to [facility] on 02/29/2024 from home for acute respiratory distress and was ultimately intubated and sedated. Patient has had a complicated hospitalization course with failed attempts of SBT and vent dyssynchrony requiring paralysis, MRSA pneumonia, and ongoing status asthmaticus. Patient had a tracheostomy placed on 03/12/2024.   Interventional radiology consulted for PEG placement. Patient is receiving nutrition via feeding tube. CT abdomen performed on 03/13/2024 with anatomy approved by Dr. Philip. Over the last few days, patient remained febrile with an increase in her leukocytosis. Ok to proceed per Dr. Hughes.   Patient is a FULL CODE. She received Lovenox  injection today. Plan for PEG tube placement tomorrow. Patient seen and examined. She is on a ventilator via trach with dilaudid  and propofol  infusing. She is unresponsive. Family not at bedside.   Past Medical History:  Diagnosis Date   Allergy    Anxiety    Asthma    Bilateral carpal tunnel syndrome 12/31/2018   Bipolar affective, manic (HCC) 1996   COPD (chronic obstructive pulmonary disease) (HCC)    Depression    HLD (hyperlipidemia) 03/12/2018   Lung nodule, multiple 09/26/2016   Manic depressive illness (HCC) 1996   Seizures (HCC)    Substance abuse (HCC)    Tobacco abuse 10/04/2016    Past Surgical History:  Procedure Laterality Date   ABDOMINAL HYSTERECTOMY     CARPAL TUNNEL RELEASE Right 01/15/2019   Procedure: RIGHTCARPAL TUNNEL RELEASE, LEFT DEQUERVAINS INJECTION;  Surgeon: Jerri Kay HERO, MD;  Location: [facility];  Service: Orthopedics;  Laterality:  Right;   CARPAL TUNNEL RELEASE Left 02/26/2019   Procedure: LEFT CARPAL TUNNEL RELEASE;  Surgeon: Jerri Kay HERO, MD;  Location: [facility];  Service: Orthopedics;  Laterality: Left;  Bier block   COLONOSCOPY WITH PROPOFOL  N/A 11/28/2018   Procedure: COLONOSCOPY WITH PROPOFOL ;  Surgeon: Therisa Bi, MD;  Location: [facility];  Service: Gastroenterology;  Laterality: N/A;   TRACHEOSTOMY TUBE PLACEMENT N/A 03/12/2024   Procedure: CREATION, TRACHEOSTOMY;  Surgeon: Milissa Hamming, MD;  Location: [facility] ORS;  Service: ENT;  Laterality: N/A;  Trach in place   TUBAL LIGATION      Allergies: Flax seed oil [flax seed oil], Other, and Trazodone  Medications: Prior to Admission medications  Medication Sig Start Date End Date Taking? Authorizing Provider  albuterol  (PROVENTIL  HFA) 108 (90 Base) MCG/ACT inhaler Inhale 2 puffs into the lungs every 4-6 hours as needed. 09/21/21  Yes Iloabachie, Chioma E, NP  amphetamine-dextroamphetamine (ADDERALL XR) 10 MG 24 hr capsule Take 10 mg by mouth daily.   Yes [provider]  citalopram  (CELEXA ) 40 MG tablet Take 1 tablet (40 mg total) by mouth daily. 11/21/22  Yes   Fluticasone  Furoate (ARNUITY ELLIPTA ) 200 MCG/ACT AEPB Inhale 1 puff into the lungs daily. 12/06/23  Yes Dgayli, Belva, MD  hydrOXYzine  (ATARAX ) 50 MG tablet Take 1-1.5 tablets (50-75 mg total) by mouth at bedtime as needed for sleep 11/21/22  Yes   lamoTRIgine  (LAMICTAL ) 100 MG tablet Take 1 tablet (100 mg total) by mouth daily. 11/21/22  Yes   rosuvastatin  (CRESTOR ) 10 MG tablet Take 10 mg by mouth at  bedtime. 08/13/23  Yes [provider]  traMADol (ULTRAM) 50 MG tablet Take 1 tablet by mouth every 6 (six) hours as needed.   Yes [provider]  umeclidinium-vilanterol (ANORO ELLIPTA ) 62.5-25 MCG/ACT AEPB Inhale 1 puff into the lungs daily. 12/06/23  Yes Dgayli, Belva, MD  ipratropium-albuterol  (DUONEB) 0.5-2.5 (3) MG/3ML SOLN INHALE CONTENTS OF ONE VIAL  ( TOTAL) USING NEBULIZER  ONCE EVERY 6 HOURS. 11/02/20 12/06/23  Iloabachie, Chioma E, NP  atomoxetine  (STRATTERA ) 25 MG capsule Take 1 capsule (25 mg total) by mouth once every evening after dinner. Patient not taking: Reported on 12/23/2020 10/26/20 03/09/21    loratadine  (CLARITIN ) 10 MG tablet Take 1 tablet (10 mg total) by mouth daily as needed for allergies. 12/16/19 12/25/19  Iloabachie, Chioma E, NP     Family History  Problem Relation Age of Onset   Stroke Mother    Other Mother        blood clot   Heart attack Father    CAD Father    Bipolar disorder Brother    Schizophrenia Brother    Schizophrenia Brother    Bipolar disorder Brother     Social History   Socioeconomic History   Marital status: Married    Spouse name: Not on file   Number of children: 2   Years of education: taking some medical billing classes now   Highest education level: Not on file  Occupational History   Occupation: Caretaker  Tobacco Use   Smoking status: Every Day    Current packs/day: 0.50    Types: Cigarettes   Smokeless tobacco: Current    Types: Chew, Snuff    Last attempt to quit: 2018   Tobacco comments:    Smokes 0.5 PPD- khj 12/06/2023        Using nicotine  pouch to try and help her quit smoking.   Vaping Use   Vaping status: Former   Quit date: 08/11/2016  Substance and Sexual Activity   Alcohol use: Not Currently    Comment: rare   Drug use: No    Types: Crack cocaine    Comment: history 2013   Sexual activity: Yes    Birth control/protection: Condom, None  Other Topics Concern   Not on file  Social History Narrative   Lives with her husband.   Right-handed.   Caffeine use: 2 cups daily.   Social Drivers of Health   Tobacco Use: High Risk (02/29/2024)   Patient History    Smoking Tobacco Use: Every Day    Smokeless Tobacco Use: Current    Passive Exposure: Not on file  Financial Resource Strain: Not on file  Food Insecurity: Patient Unable To Answer  (02/29/2024)   Epic    Worried About Programme Researcher, Broadcasting/film/video in the Last Year: Patient unable to answer    Ran Out of Food in the Last Year: Patient unable to answer  Transportation Needs: Patient Unable To Answer (02/29/2024)   Epic    Lack of Transportation (Medical): Patient unable to answer    Lack of Transportation (Non-Medical): Patient unable to answer  Physical Activity: Not on file  Stress: Not on file  Social Connections: Not on file  Depression (EYV7-0): Not on file  Alcohol Screen: Not on file  Housing: Patient Unable To Answer (02/29/2024)   Epic    Unable to Pay for Housing in the Last Year: Patient unable to answer    Number of Times Moved in the Last Year: Not on file  Homeless in the Last Year: Patient unable to answer  Utilities: Patient Unable To Answer (02/29/2024)   Epic    Threatened with loss of utilities: Patient unable to answer  Health Literacy: Not on file     Review of Systems: A 12 point ROS discussed and pertinent positives are indicated in the HPI above.  All other systems are negative.  Review of Systems  Reason unable to perform ROS: Patient is sedated.    Vital Signs: BP (!) 149/67   Pulse 90   Temp 98.4 F (36.9 C)   Resp 15   Ht 5' 6 (1.676 m)   Wt 164 lb 3.9 oz (74.5 kg) Comment: unable to get accurate weight due to weight malfunction  SpO2 96%   BMI 26.51 kg/m   Physical Exam Vitals reviewed.  Constitutional:      Appearance: Normal appearance.  HENT:     Nose:     Comments: NG in place     Mouth/Throat:     Comments: Tracheostomy in place Cardiovascular:     Rate and Rhythm: Normal rate.  Abdominal:     Comments: Abdomen round and firm with mild distention  Musculoskeletal:     Right lower leg: No edema.     Left lower leg: No edema.  Skin:    General: Skin is warm and dry.  Neurological:     Comments: Unable to assess. Sedated.       Imaging: DG Chest Port 1 View Result Date: 03/15/2024 CLINICAL DATA:  [id number]  Acute hypoxic respiratory failure [facility]) [id number] EXAM: PORTABLE CHEST 1 VIEW COMPARISON:  March 14, 2024 FINDINGS: The cardiomediastinal silhouette is unchanged in contour.Tracheostomy. The enteric tube courses through the chest to the abdomen beyond the field-of-view. No pleural effusion. No pneumothorax. Similar heterogeneous airspace opacity at the RIGHT lung base compared to prior. Gaseous distension of bowel beneath the LEFT hemidiaphragm. IMPRESSION: Similar heterogeneous airspace opacity at the RIGHT lung base with differential considerations including infection, atelectasis or aspiration. Electronically Signed   By: Corean Salter M.D.   On: 03/15/2024 11:48   US  Venous Img Lower Bilateral (DVT) Result Date: 03/14/2024 CLINICAL DATA:  Edema EXAM: Bilateral Lower Extremity Venous Doppler Ultrasound TECHNIQUE: Gray-scale sonography with compression, as well as color and duplex ultrasound, were performed to evaluate the deep venous system(s) from the level of the common femoral vein through the popliteal and proximal calf veins. COMPARISON:  None available FINDINGS: VENOUS Normal compressibility of the common femoral, superficial femoral, and popliteal veins, as well as the visualized calf veins. Visualized portions of profunda femoral vein and great saphenous vein unremarkable. No filling defects to suggest DVT on grayscale or color Doppler imaging. Doppler waveforms show normal direction of venous flow, normal respiratory plasticity and response to augmentation. OTHER None. Limitations: none IMPRESSION: No lower extremity DVT. Electronically Signed   By: Aliene Lloyd M.D.   On: 03/14/2024 17:26   DG Chest Port 1 View Result Date: 03/14/2024 CLINICAL DATA:  Acute respiratory failure with hypoxia. EXAM: PORTABLE CHEST 1 VIEW COMPARISON:  03/13/2024 FINDINGS: Tracheostomy tube. Feeding tube extends into the abdomen but the tip is beyond the image. New linear and hazy densities in the right perihilar  region and right lower lung. Left lung is clear. Heart size is normal. Negative for pneumothorax. IMPRESSION: 1. New linear and hazy densities in the right perihilar region and right lower lung. Findings are suggestive for atelectasis and possible airspace disease. 2. Support apparatuses as described. Electronically Signed  By: Juliene Balder M.D.   On: 03/14/2024 09:37   DG Chest Port 1 View Result Date: 03/13/2024 CLINICAL DATA:  Acute respiratory failure with hypoxia. EXAM: PORTABLE CHEST 1 VIEW COMPARISON:  March 09, 2024 FINDINGS: Since the prior study the previously noted endotracheal tube is been removed. A new tracheostomy tube is in place with its distal tip seen at the level of the clavicles. There is stable enteric tube positioning. The heart size and mediastinal contours are within normal limits. Mild, stable bibasilar atelectasis and/or infiltrate is seen. Mild hazy atelectatic changes are scattered throughout the remainder of both lungs. No pleural effusion or pneumothorax is identified. The visualized skeletal structures are unremarkable. IMPRESSION: 1. Interval endotracheal tube removal with new tracheostomy tube in place. 2. Mild, stable bibasilar atelectasis and/or infiltrate. Electronically Signed   By: Suzen Dials M.D.   On: 03/13/2024 14:19   CT ABDOMEN WO CONTRAST Result Date: 03/13/2024 CLINICAL DATA:  59 year old female, anatomic evaluation prior to possible percutaneous gastrostomy tube placement. EXAM: CT ABDOMEN WITHOUT CONTRAST TECHNIQUE: Multidetector CT imaging of the abdomen was performed following the standard protocol without IV contrast. RADIATION DOSE REDUCTION: This exam was performed according to the departmental dose-optimization program which includes automated exposure control, adjustment of the mA and/or kV according to patient size and/or use of iterative reconstruction technique. COMPARISON:  None Available. FINDINGS: Lower chest: Trace, right greater than left  pleural effusions with bibasilar subsegmental dependent atelectasis. Hepatobiliary: No focal liver abnormality is seen. No gallstones, gallbladder wall thickening, or biliary dilatation. Pancreas: Unremarkable. No pancreatic ductal dilatation or surrounding inflammatory changes. Spleen: Normal in size without focal abnormality. Adrenals/Urinary Tract: Adrenal glands are unremarkable. Kidneys are normal, without renal calculi, focal lesion, or hydronephrosis. Stomach/Bowel: Stomach is within normal limits. Body of stomach guarded by overlying liver parenchyma and transverse colon. Scattered colonic diverticula. No evidence of bowel wall thickening, distention, or inflammatory changes. Vascular/Lymphatic: Aortic atherosclerosis. No enlarged abdominal lymph nodes. Other: No abdominal wall hernia or abnormality. Musculoskeletal: No acute or significant osseous findings. IMPRESSION: 1. Marginal anatomy for percutaneous gastrostomy tube placement which may improve with gastric insufflation. 2. Trace, right greater than left pleural effusions with bibasilar subsegmental dependent atelectasis. 3. Aortic atherosclerosis (ICD10-I70.0). Ester Sides, MD [facility] Electronically Signed   By: Ester Sides M.D.   On: 03/13/2024 12:08   DG Abd 1 View Result Date: 03/13/2024 EXAM: 1 VIEW XRAY OF THE ABDOMEN 03/13/2024 05:00:00 AM COMPARISON: 03/06/2024 CLINICAL HISTORY: Encounter for imaging study to confirm nasogastric tube placement. FINDINGS: LINES, TUBES AND DEVICES: Weighted enteric tube in place with tip in distal stomach. LUNG BASES: Mild atelectasis at lung bases. BOWEL: Nonobstructive bowel gas pattern. SOFT TISSUES: No abnormal calcifications. BONES: No acute fracture. IMPRESSION: 1. Weighted enteric tube in place with tip in distal stomach. Electronically signed by: Waddell Calk MD 03/13/2024 05:33 AM EST RP Workstation: [id number]   CT HEAD WO CONTRAST  ( ) Result Date: 03/10/2024 CLINICAL DATA:  Initial evaluation for acute encephalopathy. EXAM: CT HEAD WITHOUT CONTRAST TECHNIQUE: Contiguous axial images were obtained from the base of the skull through the vertex without intravenous contrast. RADIATION DOSE REDUCTION: This exam was performed according to the departmental dose-optimization program which includes automated exposure control, adjustment of the mA and/or kV according to patient size and/or use of iterative reconstruction technique. COMPARISON:  Prior study from 02/29/2024. FINDINGS: Brain: Cerebral volume within normal limits for patient age. No acute intracranial hemorrhage. No acute large vessel territory infarct.  No mass lesion, midline shift, or mass effect. Ventricles are normal in size without hydrocephalus. No extra-axial fluid collection. Vascular: No abnormal hyperdense vessel. Skull: Scalp soft tissues demonstrate no acute abnormality. Calvarium intact. Sinuses/Orbits: Globes and orbital soft tissues within normal limits. Moderate mucosal thickening with scattered air-fluid levels about the visualized sphenoid ethmoidal sinuses. Mastoid air cells are largely clear. IMPRESSION: 1. Normal head CT. No acute intracranial abnormality. 2. Moderate mucosal thickening with scattered air-fluid levels about the visualized sphenoid ethmoidal sinuses. Clinical correlation for possible acute sinusitis recommended. Electronically Signed   By: Morene Hoard M.D.   On: 03/10/2024 18:15   CT Angio Chest Pulmonary Embolism (PE) W or WO Contrast Result Date: 03/10/2024 CLINICAL DATA:  Respiratory failure EXAM: CT ANGIOGRAPHY CHEST WITH CONTRAST TECHNIQUE: Multidetector CT imaging of the chest was performed using the standard protocol during bolus administration of intravenous contrast. Multiplanar CT image reconstructions and MIPs were obtained to evaluate the vascular anatomy. RADIATION DOSE REDUCTION: This exam was performed according to the  departmental dose-optimization program which includes automated exposure control, adjustment of the mA and/or kV according to patient size and/or use of iterative reconstruction technique. CONTRAST:  75mL OMNIPAQUE  IOHEXOL  350 MG/ML SOLN COMPARISON:  Chest x-ray 03/09/2024, chest CT 06/09/2020 FINDINGS: Cardiovascular: Satisfactory opacification of the pulmonary arteries to the segmental level. No evidence of pulmonary embolism. 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 No aneurysm or dissection. Multi vessel coronary vascular calcification. Normal cardiac size. No pericardial effusion Mediastinum/Nodes: Endotracheal tube tip terminates above the carina. Enteric tube tip is in the stomach. No suspicious lymph nodes. No dominant thyroid  mass Lungs/Pleura: Bilateral lower lobe partial consolidations. No pleural effusion. Diffuse bilateral peribronchovascular mild ground-glass disease and small nodules. Upper Abdomen: No acute finding Musculoskeletal: No acute osseous abnormality Review of the MIP images confirms the above findings. IMPRESSION: 1. Negative for acute pulmonary embolus or aortic dissection. 2. Bilateral lower lobe partial consolidations with diffuse bilateral peribronchovascular mild ground-glass disease and small nodules, findings are suspicious for multifocal pneumonia. Imaging follow-up to resolution is recommended 3. Multi vessel coronary vascular calcification. Electronically Signed   By: Luke Bun M.D.   On: 03/10/2024 17:07   US  Venous Img Upper Bilat (DVT) Result Date: 03/09/2024 EXAM: US  Duplex bilateral Upper Extremity Veins. TECHNIQUE: Real-time ultrasound scan of the veins of the bilateral upper extremity with color Doppler flow, spectral waveform analysis and compression. COMPARISON: None available. CLINICAL HISTORY: Swelling. ICD10: [id number] Injury to blood vessels of thorax without mention of open wound into cavity. FINDINGS: SUPERFICIAL VEINS: The  cephalic veins bilaterally demonstrate focal thrombus at the level of the antecubital fossa. Findings are consistent with superficial venous thrombosis bilaterally. The basilic veins are compressible, and demonstrate normal color Doppler flow. DEEP VEINS: The internal jugular, subclavian, axillary and brachial veins are compressible, and demonstrate normal color Doppler flow. SOFT TISSUES: No acute finding. IMPRESSION: 1. Superficial venous thrombosis in the cephalic veins bilaterally at the level of the antecubital fossa. 2. No deep venous thrombosis in the bilateral upper extremities. Electronically signed by: Elsie Gravely MD 03/09/2024 05:36 PM EST RP Workstation: [id number]   DG Chest Port 1 View Result Date: 03/09/2024 CLINICAL DATA:  Respiratory failure with hypoxia. EXAM: PORTABLE CHEST 1 VIEW COMPARISON:  03/08/2024 FINDINGS: Endotracheal tube tip is 3.2 cm above the base of the carina. NG tube tip is in the stomach. Interval improvement in basilar aeration with persistent atelectasis or infiltrate bilaterally and tiny bilateral pleural effusions. NASCET a congestion again  noted in underlying component of mild interstitial edema not excluded. Telemetry leads overlie the chest. IMPRESSION: Interval improvement in basilar aeration with persistent atelectasis or infiltrate bilaterally and tiny bilateral pleural effusions. Electronically Signed   By: Camellia Candle M.D.   On: 03/09/2024 06:59   DG Chest Port 1 View Result Date: 03/08/2024 EXAM: 1 VIEW(S) XRAY OF THE CHEST 03/08/2024 06:28:36 PM COMPARISON: 03/08/2024 CLINICAL HISTORY: Respiratory failure with hypoxia. HCC [id number]. FINDINGS: LINES, TUBES AND DEVICES: Endotracheal tube in place with tip 3.5 cm above the carina. Enteric tube in place extending below the diaphragm with tip beyond the inferior margin of the image. LUNGS AND PLEURA: Bibasilar atelectasis or opacities present. Small pleural effusions. No pneumothorax. HEART AND MEDIASTINUM: No  acute abnormality of the cardiac and mediastinal silhouettes. BONES AND SOFT TISSUES: No acute osseous abnormality. IMPRESSION: 1. Bibasilar atelectasis or opacities. 2. Endotracheal tube and enteric tube in appropriate position. 3. Small pleural effusions. Electronically signed by: Elsie Gravely MD 03/08/2024 06:32 PM EST RP Workstation: [id number]   DG Chest Port 1 View Result Date: 03/08/2024 EXAM: 1 VIEW(S) XRAY OF THE CHEST 03/08/2024 09:44:49 AM COMPARISON: 03/07/2024 CLINICAL HISTORY: Hypoxia. FINDINGS: LINES, TUBES AND DEVICES: Enteric tube in place with tip projecting over the left upper quadrant consistent with location in the upper stomach. ET tube in place 3 cm above carina. LUNGS AND PLEURA: Increased patchy bibasilar opacities. No pleural effusion. No pneumothorax. HEART AND MEDIASTINUM: No acute abnormality of the cardiac and mediastinal silhouettes. BONES AND SOFT TISSUES: No acute osseous abnormality. IMPRESSION: 1. Increased patchy bibasilar opacities. 2. Endotracheal tube tip approximately 3 cm above the carina. 3. Enteric tube tip projects over the upper stomach. Electronically signed by: Elsie Gravely MD 03/08/2024 04:07 PM EST RP Workstation: [id number]   DG Chest Port 1 View Result Date: 03/07/2024 CLINICAL DATA:  Respiratory failure with hypoxia EXAM: PORTABLE CHEST 1 VIEW COMPARISON:  Yesterday FINDINGS: The heart size and mediastinal contours are within normal limits. Endotracheal and nasogastric tubes are unchanged. Minimal bibasilar subsegmental atelectasis is noted. The visualized skeletal structures are unremarkable. IMPRESSION: Minimal bibasilar subsegmental atelectasis. Electronically Signed   By: Lynwood Landy Raddle M.D.   On: 03/07/2024 08:02   DG Chest Port 1 View Result Date: 03/06/2024 EXAM: 1 VIEW(S) XRAY OF THE CHEST 03/06/2024 11:45:13 AM COMPARISON: 03/05/2024 CLINICAL HISTORY: Shortness of breath. FINDINGS: LINES, TUBES AND DEVICES: Endotracheal tube in place with  tip 3.3 cm above the carina. Enteric tube in place terminating in the gastric fundus. LUNGS AND PLEURA: Minimal bibasilar atelectasis. No focal consolidation. No pleural effusion. No pneumothorax. HEART AND MEDIASTINUM: No acute abnormality of the cardiac and mediastinal silhouettes. BONES AND SOFT TISSUES: No acute osseous abnormality. IMPRESSION: 1. Lower lung volumes. Streaky bibasilar atelectasis. No pleural effusion or pneumothorax. 2. Endotracheal and enteric tubes in appropriate position. Electronically signed by: Rogelia Myers MD 03/06/2024 12:47 PM EST RP Workstation: [id number]   DG Abd 1 View Result Date: 03/06/2024 CLINICAL DATA:  OG tube placement EXAM: ABDOMEN - 1 VIEW COMPARISON:  03/05/2024 FINDINGS: Enteric tube tip and side port overlies the proximal stomach. Mild crinkled appearance of the tube distal to the side port. Visible gas pattern is unobstructed IMPRESSION: Enteric tube tip and side port overlie the proximal stomach. Electronically Signed   By: Luke Bun M.D.   On: 03/06/2024 01:55   DG Abd 1 View Result Date: 03/05/2024 EXAM: 1 VIEW XRAY OF THE ABDOMEN 03/05/2024 08:21:00 PM COMPARISON: 03/04/2024. CLINICAL HISTORY: [id number] Encounter for orogastric (OG)  tube placement [id number] Encounter for orogastric (OG) tube placement FINDINGS: LINES, TUBES AND DEVICES: OG tube tip is in the stomach, stable since the prior study. BOWEL: Nonobstructive bowel gas pattern. SOFT TISSUES: No abnormal calcifications. BONES: No acute fracture. IMPRESSION: 1. OG tube tip is in the stomach, stable since the prior study. Electronically signed by: Franky Crease MD 03/05/2024 08:32 PM EST RP Workstation: [id number]   ECHOCARDIOGRAM COMPLETE Result Date: 03/05/2024    ECHOCARDIOGRAM REPORT   Patient Name:   ZELLIE JENNING Date of Exam: 03/05/2024 Medical Rec #:  [id number]       Height:       66.0 in Accession #:    [id number]      Weight:       164.2 lb Date of Birth:  [birth year]       BSA:          1.839  m Patient Age:    58 years        BP:           152/86 mmHg Patient Gender: F               HR:           103 bpm. Exam Location:  [facility] Procedure: 2D Echo, Cardiac Doppler and Color Doppler (Both Spectral and Color            Flow Doppler were utilized during procedure). Indications:     Acute respiratory distress  History:         Patient has no prior history of Echocardiogram examinations.                  CAD, COPD; Risk Factors:Diabetes and Dyslipidemia.  Sonographer:     Philomena Daring Referring Phys:  [id number] LONELL KANDICE MOOSE Diagnosing Phys: Evalene Lunger MD  Sonographer Comments: Image acquisition challenging due to respiratory motion. IMPRESSIONS  1. Left ventricular ejection fraction, by estimation, is 60 to 65%. Left ventricular ejection fraction by PLAX is 63 %. The left ventricle has normal function. The left ventricle has no regional wall motion abnormalities. Left ventricular diastolic parameters were normal.  2. Right ventricular systolic function is normal. The right ventricular size is normal.  3. The mitral valve is normal in structure. No evidence of mitral valve regurgitation. No evidence of mitral stenosis.  4. The aortic valve is normal in structure. Aortic valve regurgitation is not visualized. Aortic valve sclerosis is present, with no evidence of aortic valve stenosis.  5. The inferior vena cava is normal in size with greater than 50% respiratory variability, suggesting right atrial pressure of 3 mmHg. FINDINGS  Left Ventricle: Left ventricular ejection fraction, by estimation, is 60 to 65%. Left ventricular ejection fraction by PLAX is 63 %. The left ventricle has normal function. The left ventricle has no regional wall motion abnormalities. Strain was performed and the global longitudinal strain is indeterminate. The left ventricular internal cavity size was normal in size. There is no left ventricular hypertrophy. Left ventricular diastolic parameters were normal. Right Ventricle: The right  ventricular size is normal. No increase in right ventricular wall thickness. Right ventricular systolic function is normal. Left Atrium: Left atrial size was normal in size. Right Atrium: Right atrial size was normal in size. Pericardium: There is no evidence of pericardial effusion. Mitral Valve: The mitral valve is normal in structure. No evidence of mitral valve regurgitation. No evidence of mitral valve stenosis. Tricuspid Valve: The tricuspid valve is normal in structure.  Tricuspid valve regurgitation is not demonstrated. No evidence of tricuspid stenosis. Aortic Valve: The aortic valve is normal in structure. Aortic valve regurgitation is not visualized. Aortic valve sclerosis is present, with no evidence of aortic valve stenosis. Pulmonic Valve: The pulmonic valve was normal in structure. Pulmonic valve regurgitation is not visualized. No evidence of pulmonic stenosis. Aorta: The aortic root is normal in size and structure. Venous: The inferior vena cava is normal in size with greater than 50% respiratory variability, suggesting right atrial pressure of 3 mmHg. IAS/Shunts: No atrial level shunt detected by color flow Doppler. Additional Comments: 3D was performed not requiring image post processing on an independent workstation and was indeterminate.  LEFT VENTRICLE PLAX 2D LV EF:         Left            Diastology                ventricular     LV e' lateral: 11.20 cm/s                ejection                fraction by                PLAX is 63                %. LVIDd:         3.90 cm LVIDs:         2.60 cm LV PW:         0.80 cm LV IVS:        1.00 cm LVOT diam:     2.10 cm LV SV:         69 LV SV Index:   37 LVOT Area:     3.46 cm  RIGHT VENTRICLE             IVC RV Basal diam:  2.70 cm     IVC diam: 2.30 cm RV Mid diam:    2.00 cm RV S prime:     11.20 cm/s TAPSE (M-mode): 1.9 cm LEFT ATRIUM             Index        RIGHT ATRIUM          Index LA diam:        2.80 cm 1.52 cm/m   RA Area:     8.59 cm  LA Vol (A2C):   29.3 ml 15.93 ml/m  RA Volume:   14.80 ml 8.05 ml/m LA Vol (A4C):   25.7 ml 13.97 ml/m LA Biplane Vol: 28.1 ml 15.28 ml/m  AORTIC VALVE LVOT Vmax:   122.00 cm/s LVOT Vmean:  87.400 cm/s LVOT VTI:    0.198 m  AORTA Ao Root diam: 2.80 cm  SHUNTS Systemic VTI:  0.20 m Systemic Diam: 2.10 cm Evalene Lunger MD Electronically signed by Evalene Lunger MD Signature Date/Time: 03/05/2024/4:39:53 PM    Final    DG Chest Port 1 View Result Date: 03/05/2024 CLINICAL DATA:  Acute respiratory failure. EXAM: PORTABLE CHEST 1 VIEW COMPARISON:  Chest radiograph dated 03/04/2024. FINDINGS: Endotracheal tube above the carina in similar position. Enteric tube extends below the diaphragm with tip beyond the inferior margin of the image. Mild diffuse hazy density in the right lung and interstitial prominence may be related to superimposition of the right breast. Asymmetric edema or pneumonia are less likely. No consolidative changes. No pleural effusion pneumothorax.  Stable cardiac silhouette. No acute osseous pathology. IMPRESSION: 1. Endotracheal tube above the carina in similar position. 2. No focal consolidation. Electronically Signed   By: Vanetta Chou M.D.   On: 03/05/2024 11:07   DG Abd 1 View Result Date: 03/04/2024 CLINICAL DATA:  NG placement. EXAM: ABDOMEN - 1 VIEW COMPARISON:  Radiograph dated 02/29/2024. FINDINGS: Enteric tube with tip in the left hemiabdomen likely in the body of the stomach. No bowel dilatation. Air is noted in the colon. No acute osseous pathology. IMPRESSION: Enteric tube with tip in the body of the stomach. Electronically Signed   By: Vanetta Chou M.D.   On: 03/04/2024 13:12   DG Chest Port 1 View Result Date: 03/04/2024 CLINICAL DATA:  Status post intubation and OG tube placement. EXAM: PORTABLE CHEST 1 VIEW COMPARISON:  March 04, 2024 (9:58 a.m.) FINDINGS: Since the prior study there is been interval placement of an endotracheal tube with its distal tip  approximately 7.4 cm from the carina. Interval enteric tube placement is also seen with its distal tip just beyond the expected region of the gastroesophageal junction. The distal side hole is approximately 4.7 cm proximal to the gastroesophageal junction. The heart size and mediastinal contours are within normal limits. Mild left perihilar airspace disease is suspected. No pleural effusion or pneumothorax is identified. No acute osseous abnormalities are identified. IMPRESSION: 1. Interval endotracheal tube and enteric tube placement, as described above. Advancement of the enteric tube by approximately 10 cm is recommended to decrease the risk of aspiration. 2. Mild left perihilar airspace disease. Electronically Signed   By: Suzen Dials M.D.   On: 03/04/2024 11:32   DG Chest Port 1 View Result Date: 03/04/2024 EXAM: 1 VIEW(S) XRAY OF THE CHEST 03/04/2024 10:00:00 AM COMPARISON: 03/03/2024 CLINICAL HISTORY: 08790 Atelectasis 91209 91209 Atelectasis 91209 91209 Atelectasis 91209 Atelectasis 91209 Atelectasis 91209 FINDINGS: LINES, TUBES AND DEVICES: Endotracheal and enteric tubes removed. LUNGS AND PLEURA: Minor bibasilar atelectasis. No pleural effusion. No pneumothorax. HEART AND MEDIASTINUM: No acute abnormality of the cardiac and mediastinal silhouettes. BONES AND SOFT TISSUES: No acute osseous abnormality. IMPRESSION: 1. Minimal bibasilar opacities likely reflecting atelectasis, decreased from prior. 2. Interval removal of endotracheal and enteric tubes. Electronically signed by: Donnice Mania MD 03/04/2024 10:23 AM EST RP Workstation: [id number]   DG Chest Port 1 View Result Date: 03/03/2024 CLINICAL DATA:  Pneumonia. EXAM: PORTABLE CHEST 1 VIEW COMPARISON:  Radiographs 03/02/2024 and 02/29/2024. Chest CT 06/09/2020. FINDINGS: 1014 hours. The endotracheal and enteric tubes appear unchanged, tip of the latter not visualized. The heart size and mediastinal contours are stable. There is improved  aeration of both lung bases with mild residual atelectasis. No confluent airspace disease, pneumothorax or significant pleural effusion. The bones appear unchanged. IMPRESSION: Improved aeration of both lung bases with mild residual atelectasis. No new findings. Electronically Signed   By: Elsie Perone M.D.   On: 03/03/2024 16:13   DG Chest Port 1 View Result Date: 03/02/2024 EXAM: 1 VIEW(S) XRAY OF THE CHEST 03/02/2024 02:38:00 PM COMPARISON: 02/29/2024 CLINICAL HISTORY: Infiltrate of both lungs present on imaging study. FINDINGS: LINES, TUBES AND DEVICES: Endotracheal tube in place with tip 6.5 cm above the carina. Enteric tube in place with tip coursing below the diaphragm with tip and side port colimated off view. LUNGS AND PLEURA: Increased bibasilar patchy opacities. Trace bilateral pleural effusions. No pneumothorax. HEART AND MEDIASTINUM: No acute abnormality of the cardiac and mediastinal silhouettes. BONES AND SOFT TISSUES: No acute osseous abnormality. IMPRESSION: 1. Increased  bibasilar patchy opacities. 2. Trace bilateral pleural effusions. 3. Lines and tubes as above. Electronically signed by: Morgane Naveau MD 03/02/2024 03:37 PM EST RP Workstation: [id number]   US  RENAL Result Date: 03/01/2024 EXAM: RETROPERITONEAL ULTRASOUND OF THE KIDNEYS 03/01/2024 01:31:34 PM TECHNIQUE: Real-time ultrasonography of the retroperitoneum, specifically the kidneys and urinary bladder, was performed. COMPARISON: None available. CLINICAL HISTORY: AKI (acute kidney injury). FINDINGS: RIGHT KIDNEY: Right kidney measures 10.8 x 4.4 x 4.8 cm. Right kidney volume equals 119 ml. Normal cortical echogenicity. No hydronephrosis. No calculus. No mass. LEFT KIDNEY: Left kidney measures 9.9 x 5.3 x 4.3 cm. Left kidney volume equals 113 ml. Normal cortical echogenicity. No hydronephrosis. No calculus. No mass. BLADDER: Bladder collapsed around foley catheter. IMPRESSION: 1. No sonographic abnormality of the kidneys.  Electronically signed by: Norleen Boxer MD 03/01/2024 05:37 PM EST RP Workstation: [id number]   CT Head Wo Contrast Result Date: 02/29/2024 CLINICAL DATA:  Mental status change EXAM: CT HEAD WITHOUT CONTRAST TECHNIQUE: Contiguous axial images were obtained from the base of the skull through the vertex without intravenous contrast. RADIATION DOSE REDUCTION: This exam was performed according to the departmental dose-optimization program which includes automated exposure control, adjustment of the mA and/or kV according to patient size and/or use of iterative reconstruction technique. COMPARISON:  CT 12/07/2019, MRI 02/20/2020 FINDINGS: Brain: No acute territorial infarction, hemorrhage or intracranial mass. The ventricles are nonenlarged Vascular: No hyperdense vessels.  No unexpected calcification Skull: No fracture Sinuses/Orbits: No acute finding. Other: Incompletely visualized endotracheal and enteric tubes. IMPRESSION: Negative non contrasted CT appearance of the brain. Electronically Signed   By: Luke Bun M.D.   On: 02/29/2024 20:03   DG Abdomen 1 View Result Date: 02/29/2024 EXAM: 1 VIEW XRAY OF THE ABDOMEN 02/29/2024 06:59:11 PM COMPARISON: None available. CLINICAL HISTORY: POST INTUBATION FINDINGS: LINES, TUBES AND DEVICES: NG tube tip is in the stomach. BOWEL: Nonobstructive bowel gas pattern. SOFT TISSUES: No abnormal calcifications. BONES: No acute fracture. IMPRESSION: 1. Enteric tube tip terminates in the stomach. 2. Nonobstructive bowel gas pattern. Electronically signed by: Franky Crease MD 02/29/2024 07:23 PM EST RP Workstation: [id number]   DG Chest Portable 1 View Result Date: 02/29/2024 EXAM: 1 VIEW XRAY OF THE CHEST 02/29/2024 06:59:11 PM COMPARISON: None available. CLINICAL HISTORY: SOB SOB SOB FINDINGS: LINES, TUBES AND DEVICES: Endotracheal tube tip in the mid trachea. NG tube enters the stomach. LUNGS AND PLEURA: No confluent airspace opacities. No effusions. No pneumothorax. HEART  AND MEDIASTINUM: No acute abnormality of the cardiac and mediastinal silhouettes. BONES AND SOFT TISSUES: No acute osseous abnormality. IMPRESSION: 1. No acute cardiopulmonary process. 2. Endotracheal tube tip in the mid trachea and enteric tube courses into the stomach. Electronically signed by: Franky Crease MD 02/29/2024 07:22 PM EST RP Workstation: [id number]    Labs:  CBC: Recent Labs    03/14/24 0045 03/15/24 0245 03/16/24 0431 03/17/24 0357  WBC 18.7* 23.1* 24.8* 19.2*  HGB 10.5* 10.8* 10.2* 9.1*  HCT 32.8* 33.4* 33.0* 30.0*  PLT 354 366 365 361    BMP: Recent Labs    03/14/24 2054 03/15/24 0245 03/16/24 0431 03/17/24 0357  NA 143 145 144 148*  K 4.3 4.2 5.0 4.9  CL 108 109 108 113*  CO2 24 24 23 25   GLUCOSE 262* 152* 259* 239*  BUN 27* 26* 39* 44*  CALCIUM  9.2 9.5 9.8 9.9  CREATININE 0.83 0.78 0.98 0.96  GFRNONAA >60 >60 >60 >60    LIVER FUNCTION TESTS: Recent Labs    02/29/24  8166 03/07/24 0410 03/13/24 0517 03/14/24 0045 03/15/24 0245 03/16/24 0431 03/17/24 0357  BILITOT <0.2 <0.2  --   --   --   --   --   AST 23 26  --   --   --   --   --   ALT 20 28  --   --   --   --   --   ALKPHOS 90 57  --   --   --   --   --   PROT 7.1 6.2*  --   --   --   --   --   ALBUMIN 4.6 3.5   < > 3.2* 3.1* 3.1* 3.0*   < > = values in this interval not displayed.    Assessment and Plan: PEG Tube Placement for long-term nutrition Erica Gomez is a 59 year old female with a PMHx of asthma, COPD, bipolar, seizures, and polysubstance use who has been hospitalized since 02/29/2024. She has had a complicated Gomez course with difficulty weaning from ventilator requiring tracheostomy placement. IR consulted for PEG tube placement. She has been receiving nutrition via feeding tube and will need long term nutrition for future LTACH placement. Dr. Hughes reviewed case and will plan for tomorrow. Spoke with Norleen (spouse) via telephone who consented for procedure. Risks and benefits  were discussed with Norleen and all of his questions were answered. Consent on IR white board. Lovenox  and tube feeds will be held tomorrow - reflected in the [facility] and updated primary team.   Thank you for this interesting consult.  I greatly enjoyed meeting DAISEE CENTNER and look forward to participating in their care.  A copy of this report was sent to the requesting provider on this date.  Electronically Signed: Thersia LULLA Rummer, PA 03/17/2024, 11:07 AM   I spent a total of 40 Minutes   in face to face in clinical consultation, greater than 50% of which was counseling/coordinating care for PEG tube placement   "

## 2024-03-17 NOTE — Progress Notes (Signed)
 "  NAME:  Erica SCHMIEDER, MRN:  [id number], DOB:  December 30, 1965, LOS: 17 ADMISSION DATE:  02/29/2024 History of Present Illness:  59 y/o F with PMH sig for asthma, COPD, tobacco use disorder, bipolar disorder, seizure, polysubstance abuse, anxiety/depression, and HLD presented to ED BIB [facility] from home for acute respiratory distress.   On Chart review, patient follows with pulmonologist; last visit with Dr. Isadora on 10/25. Meds prescribed at visit included fluticasone  furoate (Arnuity Ellipta ) 200 mcg 1 puff daily and umeclidinium-vilanterol (Anoro Ellipta ) 62.5/25 mcg 1 puff daily. Per husband, patient has been taking medications as prescribed. EMS tx included MgSO? 2 g IV, Solu-Medrol  125 mg IV, DuoNeb x1, and albuterol  x1. During transport, pt became combative/confused and received Haldol 4 mg IM and Versed  5 mg IM. Initially A&O with EMS. SpO? 94% on RA.   ED Course: Initial VS: BP 211/120, HR 142, SpO? 94% on RA. Patient was unresponsive with labored/heaving respirations and in severe resp distress. Concern for hypercapnic resp failure contributing to agitation and AMS. Pt subsequently lost ability to protect airway. Given worsening MS and resp failure, decision made for emergent intubation. Pt received ketamine  and rocuronium  and was intubated successfully without complication.   Labs/Imaging: Labs: WBC 10.7, Hgb 12.7, Hct 40.9, Plt 313. BMP: Na 141, K 4.9, Cl 106, CO? 25, BUN 14, Cr 0.81, Glu 147, Ca 8.9. LFTs WNL: AST 23, ALT 20, Alk Phos 90, Tbili <0.2, Alb 4.6. VBG c/w resp acidosis/hypercapnia (HCO? 29.8), O? sat 98.3. Lactate 1.7. CXR: no acute cardiopulm process; ETT in mid trachea, enteric tube in stomach. CT head ordered and pending for AMS. PCCM asked to admit for further workup and treatment.   Please see Significant Hospital Events section below for full detailed hospital course.  Significant Hospital Events: Including procedures, antibiotic start and stop dates in addition to other  pertinent events   1/16: Admitted to ICU for asthma/COPD overlap exacerbation with hypercapnic respiratory failure requiring emergent intubation. 03/01/24- patient failed SBT today, remains on mV. She has AKI.   Strep pneumoniae +, will continue Rocephin .  Husband at bedside I met with him and we reviewed medical plan.  03/02/24- patient had resp distress overnight.  She still has AKI but there is an interval improvement.  03/03/24- patient for SBT today.  AKI has improved this am.  She requires heavy sedation. She has family coming in this afternoon who wishes to be present for SBT. 03/04/24- patient passed SBT and was extubated but after 1 hour started to have respiratory distress with severe hypoxemia. Her daughter was present and requested re-intubation. We continue to treat her underlying Asthma COPD exacerbation with strep pneumoniae respiratory infection.  03/05/24- Patient failed SBT today with tachypnea/tachycardia/resp distress 03/06/24- Patient failed SBT and husband was present during evaluation, she was tachypneic and tachycardic. 03/07/24- Asynchronous with vent, with obstructive process/bronchospasm.  Steroids increased to 80 mg BID, Ketamine  gtt started, place A-line for frequent ABG's. 03/08/24 - paralyzed with worsening hypercapnia. Not a candidate for ECMO. Ketamine  increased to 3 mg/hour.  03/10/24- Overnight pt became hypoxic with O2 sats decreasing to 60% requiring 10 mg of iv vecuronium  x1 dose.  Due to inability to liberate from ventilator ENT consulted for possible tracheostomy placement.  CTA Chest obtained which was negative for acute PE or aortic dissection but suspicious for multifocal pneumonia  03/11/24: Remains mechanically intubated.  Overnight required vecuronium  x1 dose due to vent dyssynchrony.  Husband at bedside currently performing WUA and SBT if she fails  will need tracheostomy which is scheduled for 01/28  03/12/24:  No acute events overnight, hemodynamically stable, no  vasopressors. Tracheostomy performed by ENT. 03/13/24: No acute events overnight.  Plan for WUA/SBT as tolerated.  Hypertensive with WUA requiring Nicardipine  gtt.  With low grade fever, remove foley, no change in secretions.  Wean steroids to 40 mg daily. 03/14/24: Pt febrile and with worsening leukocytosis vancomycin  and zosyn  started  03/15/24: Pt remains mechanically intubated with vent dyssynchrony and high peak pressures in severe respiratory distress despite propofol  gtt requiring addition of dilaudid  gtt and vecuronium  x1 dose   Interim History / Subjective:  -- Patient respiratory status slightly improved. Remains vented and deeply sedated. Peak Pressures 28. Wheezing on exam but improved.  -- Sputum with abundant MRSA.  -- Secretions improved.   Objective    Blood pressure (!) 149/67, pulse 82, temperature 97.9 F (36.6 C), resp. rate 15, height 5' 6 (1.676 m), weight 74.5 kg, SpO2 92%.    Vent Mode: PRVC FiO2 (%):  [40 %-50 %] 40 % Set Rate:  [12 bmp] 12 bmp Vt Set:  [450 mL] 450 mL PEEP:  [8 cmH20] 8 cmH20   Intake/Output Summary (Last 24 hours) at 03/17/2024 1242 Last data filed at 03/17/2024 1223 Gross per 24 hour  Intake 4038.86 ml  Output 1250 ml  Net 2788.86 ml   Filed Weights   03/10/24 0420 03/16/24 0500 03/17/24 0355  Weight: 74.5 kg 74.5 kg 74.5 kg    Examination: General: Trached and deeply sedated.  HEENT: Reactive pupils  CV: rrr, s1s2, no murmurs PULM: diffuse wheezing, prolonged expiratory phase but improved slightly from yesterday  GI: soft, non-tender, non-distended, BS+ Extremities: warm, no edema   Assessment and Plan  #Acute Hypoxic and Hypercapnic Respiratory Failure #Status Asthmaticus #COPD/Asthma overlap with what appears to be poor baseline to begin with.  #S. Pneumo Pneumonia #Now with VAP with MRSA pneumonia  #Toxic Metabolic Encephalopathy #Tobacco use.    Neurology : Propofol  from RASS -3 to -4. Dilaudid  drip. Intermittent  paralysis. dc Oxycodone  10mg  PO Q8H scheduled for now. Lamotrigine  100mg  per tube daily.   Cardiovascular - Hypertensive as we are weaning her sedation. Off nicardipine  drip. Amlodipine  10. Hydralazine  and labetalol  PRN.  Pulmonary - Status asthmaticus evidence by prolonged expiratory phase over the past week. This has improved drastically with high dose steroids, nebulizers, and brief paralytics. Her gas exchange is also improved. Given prolonged intubation and need for continued mechanical ventilation, she underwent tracheostomy tube placement which she tolerated well. Will continue with standing nebs, and attempt PSV today.  -continue steroids, IV methylpred 40mg  daily.               -CT/PE -ve for PE             - Albuterol  neb q2H Scheduled             - Back on the Vent. Peak 28. Maintain a low resp rate of < 15 to allow for a prolong exp phase and risk of autopeep. PH < 7.2.   Gastrointestinal - continue tube feeds, PPI for SUP. Will need PEG once improved from a resp stand point.   Renal - kidney function is improved further.   Endocrine - ICU glycemic protocol  Hem/Onc - DVT prophylaxis with SubQ heparin   ID - Received Empiric Cefipime for 7 days for management of pneumonia. Now with MRSA Pneumonia             - Vancomycin   for 10 days.   Other - family (husband and daughter) updated at the bedside today. Explained the role of tracheostomy in weaning her off the ventilator, and the next steps in therapy in terms of ventilator weaning. Also discussed need for PEG tube to facilitate feeding. Explained that most patients will have both PEG and trach removed with improvement in her underlying condition.  Critical care time: 50 minutes     Darrin Barn, MD [facility] 03/17/2024 12:42 PM    "

## 2024-03-17 NOTE — Plan of Care (Signed)
" °  Problem: Health Behavior/Discharge Planning: Goal: Ability to manage health-related needs will improve Outcome: Progressing   Problem: Clinical Measurements: Goal: Ability to maintain clinical measurements within normal limits will improve Outcome: Progressing Goal: Will remain free from infection Outcome: Progressing Goal: Diagnostic test results will improve Outcome: Progressing   Problem: Activity: Goal: Risk for activity intolerance will decrease Outcome: Progressing   Problem: Nutrition: Goal: Adequate nutrition will be maintained Outcome: Progressing   Problem: Pain Managment: Goal: General experience of comfort will improve and/or be controlled Outcome: Progressing   "

## 2024-03-18 ENCOUNTER — Inpatient Hospital Stay: Payer: MEDICAID

## 2024-03-18 ENCOUNTER — Inpatient Hospital Stay: Payer: MEDICAID | Admitting: Radiology

## 2024-03-18 LAB — TRIGLYCERIDES: Triglycerides: 134 mg/dL

## 2024-03-18 LAB — BLOOD GAS, ARTERIAL
Acid-Base Excess: 8.6 mmol/L — ABNORMAL HIGH (ref 0.0–2.0)
Bicarbonate: 34.1 mmol/L — ABNORMAL HIGH (ref 20.0–28.0)
FIO2: 35 %
MECHVT: 450 mL
O2 Saturation: 98.5 %
PEEP: 5 cmH2O
Patient temperature: 37
RATE: 12 {breaths}/min
pCO2 arterial: 49 mmHg — ABNORMAL HIGH (ref 32–48)
pH, Arterial: 7.45 (ref 7.35–7.45)
pO2, Arterial: 115 mmHg — ABNORMAL HIGH (ref 83–108)

## 2024-03-18 LAB — BASIC METABOLIC PANEL WITH GFR
Anion gap: 8 (ref 5–15)
BUN: 46 mg/dL — ABNORMAL HIGH (ref 6–20)
CO2: 28 mmol/L (ref 22–32)
Calcium: 10.1 mg/dL (ref 8.9–10.3)
Chloride: 116 mmol/L — ABNORMAL HIGH (ref 98–111)
Creatinine, Ser: 0.87 mg/dL (ref 0.44–1.00)
GFR, Estimated: >60 mL/min
Glucose, Bld: 182 mg/dL — ABNORMAL HIGH (ref 70–99)
Potassium: 5.1 mmol/L (ref 3.5–5.1)
Sodium: 152 mmol/L — ABNORMAL HIGH (ref 135–145)

## 2024-03-18 LAB — CBC
HCT: 29.7 % — ABNORMAL LOW (ref 36.0–46.0)
Hemoglobin: 9.1 g/dL — ABNORMAL LOW (ref 12.0–15.0)
MCH: 29.8 pg (ref 26.0–34.0)
MCHC: 30.6 g/dL (ref 30.0–36.0)
MCV: 97.4 fL (ref 80.0–100.0)
Platelets: 392 10*3/uL (ref 150–400)
RBC: 3.05 MIL/uL — ABNORMAL LOW (ref 3.87–5.11)
RDW: 15.3 % (ref 11.5–15.5)
WBC: 18 10*3/uL — ABNORMAL HIGH (ref 4.0–10.5)
nRBC: 0.1 % (ref 0.0–0.2)

## 2024-03-18 LAB — PROTIME-INR
INR: 1.1 (ref 0.8–1.2)
Prothrombin Time: 14.7 s (ref 11.4–15.2)

## 2024-03-18 LAB — CULTURE, BLOOD (ROUTINE X 2)
Culture  Setup Time: NO GROWTH
Special Requests: ADEQUATE

## 2024-03-18 LAB — MAGNESIUM: Magnesium: 2.4 mg/dL (ref 1.7–2.4)

## 2024-03-18 LAB — PHOSPHORUS: Phosphorus: 1.4 mg/dL — ABNORMAL LOW (ref 2.5–4.6)

## 2024-03-18 LAB — GLUCOSE, CAPILLARY
Glucose-Capillary: 131 mg/dL — ABNORMAL HIGH (ref 70–99)
Glucose-Capillary: 133 mg/dL — ABNORMAL HIGH (ref 70–99)
Glucose-Capillary: 137 mg/dL — ABNORMAL HIGH (ref 70–99)
Glucose-Capillary: 151 mg/dL — ABNORMAL HIGH (ref 70–99)
Glucose-Capillary: 154 mg/dL — ABNORMAL HIGH (ref 70–99)
Glucose-Capillary: 91 mg/dL (ref 70–99)

## 2024-03-18 MED ORDER — GLUCAGON HCL RDNA (DIAGNOSTIC) 1 MG IJ SOLR
INTRAMUSCULAR | Status: AC | PRN
  Administered 2024-03-18 (×2): .5 mg via INTRAVENOUS

## 2024-03-18 MED ORDER — ACETAMINOPHEN 650 MG RE SUPP
650.0000 mg | Freq: Four times a day (QID) | RECTAL | Status: DC | PRN
  Administered 2024-03-18: 650 mg via RECTAL
  Filled 2024-03-18: qty 1

## 2024-03-18 MED ORDER — GLUCAGON HCL RDNA (DIAGNOSTIC) 1 MG IJ SOLR
INTRAMUSCULAR | Status: AC
  Filled 2024-03-18: qty 1

## 2024-03-18 MED ORDER — IOHEXOL 300 MG/ML  SOLN
10.0000 mL | Freq: Once | INTRAMUSCULAR | Status: AC | PRN
  Administered 2024-03-18: 10 mL

## 2024-03-18 MED ORDER — CEFAZOLIN SODIUM-DEXTROSE 1-4 GM/50ML-% IV SOLN
INTRAVENOUS | Status: AC | PRN
  Administered 2024-03-18: 2 g via INTRAVENOUS

## 2024-03-18 MED ORDER — FREE WATER
100.0000 mL | Status: DC
  Administered 2024-03-19: 100 mL

## 2024-03-18 MED ORDER — POTASSIUM PHOSPHATES 15 MMOLE/5ML IV SOLN
15.0000 mmol | Freq: Once | INTRAVENOUS | Status: AC
  Administered 2024-03-18: 15 mmol via INTRAVENOUS
  Filled 2024-03-18: qty 5

## 2024-03-18 MED ORDER — K PHOS MONO-SOD PHOS DI & MONO 155-852-130 MG PO TABS
500.0000 mg | ORAL_TABLET | Freq: Three times a day (TID) | ORAL | Status: DC
  Administered 2024-03-18: 500 mg
  Filled 2024-03-18 (×3): qty 2

## 2024-03-18 MED ORDER — FUROSEMIDE 10 MG/ML IJ SOLN
40.0000 mg | Freq: Once | INTRAMUSCULAR | Status: AC
  Administered 2024-03-18: 40 mg via INTRAVENOUS
  Filled 2024-03-18: qty 4

## 2024-03-18 MED ORDER — CEFAZOLIN SODIUM-DEXTROSE 2-4 GM/100ML-% IV SOLN
INTRAVENOUS | Status: AC
  Filled 2024-03-18: qty 100

## 2024-03-18 MED ORDER — LIDOCAINE HCL 1 % IJ SOLN
10.0000 mL | Freq: Once | INTRAMUSCULAR | Status: AC
  Administered 2024-03-18: 10 mL via INTRADERMAL

## 2024-03-18 MED ORDER — STERILE WATER FOR INJECTION IJ SOLN
INTRAMUSCULAR | Status: AC
  Filled 2024-03-18: qty 10

## 2024-03-18 MED ORDER — LIDOCAINE HCL 1 % IJ SOLN
INTRAMUSCULAR | Status: AC
  Filled 2024-03-18: qty 20

## 2024-03-18 MED ORDER — ACETAMINOPHEN 325 MG PO TABS: 650.0000 mg | ORAL_TABLET | Freq: Four times a day (QID) | ORAL | Status: AC | PRN

## 2024-03-18 NOTE — Plan of Care (Signed)
  Problem: Clinical Measurements: Goal: Diagnostic test results will improve Outcome: Progressing Goal: Respiratory complications will improve Outcome: Progressing Goal: Cardiovascular complication will be avoided Outcome: Progressing   Problem: Elimination: Goal: Will not experience complications related to bowel motility Outcome: Progressing Goal: Will not experience complications related to urinary retention Outcome: Progressing   Problem: Pain Managment: Goal: General experience of comfort will improve and/or be controlled Outcome: Progressing   Problem: Skin Integrity: Goal: Risk for impaired skin integrity will decrease Outcome: Progressing

## 2024-03-18 NOTE — Progress Notes (Signed)
 PHARMACY CONSULT NOTE - ELECTROLYTES  Pharmacy Consult for Electrolyte Monitoring and Replacement   Recent Labs: Height: 5' 6 (167.6 cm) Weight: 75.4 kg (166 lb 3.6 oz) (unable to get accuate weight due to weight malfunction on the bed) IBW/kg (Calculated) : 59.3 Estimated Creatinine Clearance: 73.1 mL/min (by C-G formula based on SCr of 0.87 mg/dL).  Potassium (mmol/L)  Date Value  03/18/2024 5.1  11/12/2013 4.1   Magnesium  (mg/dL)  Date Value  97/96/7973 2.4   Calcium  (mg/dL)  Date Value  97/96/7973 10.1   Calcium , Total (mg/dL)  Date Value  90/69/7984 9.0   Albumin (g/dL)  Date Value  97/97/7973 3.0 (L)  05/12/2020 4.5  11/12/2013 3.5   Phosphorus (mg/dL)  Date Value  97/96/7973 1.4 (L)   Sodium (mmol/L)  Date Value  03/18/2024 152 (H)  12/06/2022 139  11/12/2013 141   Assessment  Erica Gomez is a 59 y.o. female presenting with respiratory failure. PMH significant for COPD, asthma, CAD, seizures, and bipolar disorder. Pt has been hyperkalemic up to 6.1 on 1/17. Pharmacy has been consulted to monitor and replace electrolytes.  Diet: Tube feeds 55 mL/hr + free water  30mL Q4H  Goal of Therapy: Electrolytes WNL  Plan:  --Na 152, consider increasing free water  flushes --Phos 1.4, K Phos  Neutral 500 mg per tube TID x 3 doses --Follow-up electrolytes with AM labs tomorrow  Thank you for allowing pharmacy to be a part of this patient's care.  Odysseus Cada B Shayma Pfefferle 03/18/2024 7:55 AM

## 2024-03-18 NOTE — Plan of Care (Signed)
" °  Problem: Education: Goal: Knowledge of General Education information will improve Description: Including pain rating scale, medication(s)/side effects and non-pharmacologic comfort measures Outcome: Not Progressing   Problem: Clinical Measurements: Goal: Will remain free from infection Outcome: Not Progressing Goal: Diagnostic test results will improve Outcome: Not Progressing Goal: Respiratory complications will improve Outcome: Not Progressing Goal: Cardiovascular complication will be avoided Outcome: Not Progressing   Problem: Nutrition: Goal: Adequate nutrition will be maintained Outcome: Not Progressing   Problem: Coping: Goal: Level of anxiety will decrease Outcome: Not Progressing   "

## 2024-03-19 LAB — BASIC METABOLIC PANEL WITH GFR
Anion gap: 9 (ref 5–15)
Anion gap: 10 (ref 5–15)
BUN: 41 mg/dL — ABNORMAL HIGH (ref 6–20)
BUN: 38 mg/dL — ABNORMAL HIGH (ref 6–20)
CO2: 30 mmol/L (ref 22–32)
CO2: 30 mmol/L (ref 22–32)
Calcium: 10 mg/dL (ref 8.9–10.3)
Calcium: 10.2 mg/dL (ref 8.9–10.3)
Chloride: 113 mmol/L — ABNORMAL HIGH (ref 98–111)
Chloride: 115 mmol/L — ABNORMAL HIGH (ref 98–111)
Creatinine, Ser: 0.71 mg/dL (ref 0.44–1.00)
Creatinine, Ser: 0.68 mg/dL (ref 0.44–1.00)
GFR, Estimated: >60 mL/min
GFR, Estimated: >60 mL/min
Glucose, Bld: 135 mg/dL — ABNORMAL HIGH (ref 70–99)
Glucose, Bld: 121 mg/dL — ABNORMAL HIGH (ref 70–99)
Potassium: 5.3 mmol/L — ABNORMAL HIGH (ref 3.5–5.1)
Potassium: 5 mmol/L (ref 3.5–5.1)
Sodium: 151 mmol/L — ABNORMAL HIGH (ref 135–145)
Sodium: 154 mmol/L — ABNORMAL HIGH (ref 135–145)

## 2024-03-19 LAB — GLUCOSE, CAPILLARY
Glucose-Capillary: 127 mg/dL — ABNORMAL HIGH (ref 70–99)
Glucose-Capillary: 110 mg/dL — ABNORMAL HIGH (ref 70–99)
Glucose-Capillary: 142 mg/dL — ABNORMAL HIGH (ref 70–99)
Glucose-Capillary: 250 mg/dL — ABNORMAL HIGH (ref 70–99)
Glucose-Capillary: 185 mg/dL — ABNORMAL HIGH (ref 70–99)
Glucose-Capillary: 148 mg/dL — ABNORMAL HIGH (ref 70–99)

## 2024-03-19 LAB — BLOOD GAS, ARTERIAL
Acid-Base Excess: 5.3 mmol/L — ABNORMAL HIGH (ref 0.0–2.0)
Bicarbonate: 31.1 mmol/L — ABNORMAL HIGH (ref 20.0–28.0)
FIO2: 35 %
MECHVT: 450 mL
O2 Saturation: 97.1 %
PEEP: 5 cmH2O
Patient temperature: 37
RATE: 12 {breaths}/min
pCO2 arterial: 49 mmHg — ABNORMAL HIGH (ref 32–48)
pH, Arterial: 7.41 (ref 7.35–7.45)
pO2, Arterial: 76 mmHg — ABNORMAL LOW (ref 83–108)

## 2024-03-19 LAB — CBC
HCT: 31.1 % — ABNORMAL LOW (ref 36.0–46.0)
Hemoglobin: 9.5 g/dL — ABNORMAL LOW (ref 12.0–15.0)
MCH: 29 pg (ref 26.0–34.0)
MCHC: 30.5 g/dL (ref 30.0–36.0)
MCV: 94.8 fL (ref 80.0–100.0)
Platelets: 416 10*3/uL — ABNORMAL HIGH (ref 150–400)
RBC: 3.28 MIL/uL — ABNORMAL LOW (ref 3.87–5.11)
RDW: 14.9 % (ref 11.5–15.5)
WBC: 19 10*3/uL — ABNORMAL HIGH (ref 4.0–10.5)
nRBC: 0 % (ref 0.0–0.2)

## 2024-03-19 LAB — CULTURE, BLOOD (ROUTINE X 2)
Culture: NO GROWTH
Special Requests: ADEQUATE

## 2024-03-19 LAB — MAGNESIUM: Magnesium: 2.4 mg/dL (ref 1.7–2.4)

## 2024-03-19 LAB — PHOSPHORUS: Phosphorus: 4.3 mg/dL (ref 2.5–4.6)

## 2024-03-19 MED ORDER — FREE WATER
200.0000 mL | Status: DC
  Administered 2024-03-19 (×2): 200 mL

## 2024-03-19 MED ORDER — METHYLPREDNISOLONE SODIUM SUCC 40 MG IJ SOLR: 20.0000 mg | Freq: Every day | INTRAMUSCULAR | Status: AC

## 2024-03-19 MED ORDER — FREE WATER
200.0000 mL | Status: DC
  Administered 2024-03-19 – 2024-03-21 (×19): 200 mL

## 2024-03-19 MED ORDER — PREDNISONE 10 MG PO TABS: 10.0000 mg | ORAL_TABLET | Freq: Every day | ORAL | Status: AC

## 2024-03-19 MED ORDER — VANCOMYCIN HCL IN DEXTROSE 1-5 GM/200ML-% IV SOLN
1000.0000 mg | Freq: Two times a day (BID) | INTRAVENOUS | Status: AC
  Administered 2024-03-19 – 2024-03-21 (×5): 1000 mg via INTRAVENOUS
  Filled 2024-03-19 (×6): qty 200

## 2024-03-19 MED ORDER — METHYLPREDNISOLONE SODIUM SUCC 40 MG IJ SOLR
30.0000 mg | Freq: Every day | INTRAMUSCULAR | Status: AC
  Administered 2024-03-20 – 2024-03-21 (×2): 30 mg via INTRAVENOUS
  Filled 2024-03-19 (×2): qty 1

## 2024-03-19 MED ORDER — DEXTROSE 50 % IV SOLN
25.0000 mL | Freq: Once | INTRAVENOUS | Status: AC
  Administered 2024-03-19: 25 mL via INTRAVENOUS
  Filled 2024-03-19: qty 50

## 2024-03-19 MED ORDER — SODIUM ZIRCONIUM CYCLOSILICATE 5 G PO PACK
10.0000 g | PACK | Freq: Once | ORAL | Status: AC
  Administered 2024-03-19: 10 g
  Filled 2024-03-19: qty 2

## 2024-03-19 MED ORDER — MUPIROCIN 2 % EX OINT
1.0000 | TOPICAL_OINTMENT | Freq: Two times a day (BID) | CUTANEOUS | Status: AC
  Administered 2024-03-19 – 2024-03-21 (×6): 1 via NASAL
  Filled 2024-03-19: qty 22

## 2024-03-19 MED ORDER — INSULIN ASPART 100 UNIT/ML IV SOLN
5.0000 [IU] | Freq: Once | INTRAVENOUS | Status: AC
  Administered 2024-03-19: 5 [IU] via INTRAVENOUS
  Filled 2024-03-19: qty 5

## 2024-03-19 MED ORDER — ALBUTEROL SULFATE (2.5 MG/3ML) 0.083% IN NEBU
2.5000 mg | INHALATION_SOLUTION | RESPIRATORY_TRACT | Status: AC
  Administered 2024-03-19 – 2024-03-21 (×15): 2.5 mg via RESPIRATORY_TRACT
  Filled 2024-03-19 (×15): qty 3

## 2024-03-19 NOTE — Progress Notes (Signed)
 PHARMACY CONSULT NOTE - ELECTROLYTES  Pharmacy Consult for Electrolyte Monitoring and Replacement   Recent Labs: Height: 5' 6 (167.6 cm) Weight: 82.4 kg (181 lb 10.5 oz) IBW/kg (Calculated) : 59.3 Estimated Creatinine Clearance: 82.9 mL/min (by C-G formula based on SCr of 0.71 mg/dL).  Potassium (mmol/L)  Date Value  03/19/2024 5.3 (H)  11/12/2013 4.1   Magnesium  (mg/dL)  Date Value  97/95/7973 2.4   Calcium  (mg/dL)  Date Value  97/95/7973 10.0   Calcium , Total (mg/dL)  Date Value  90/69/7984 9.0   Albumin (g/dL)  Date Value  97/97/7973 3.0 (L)  05/12/2020 4.5  11/12/2013 3.5   Phosphorus (mg/dL)  Date Value  97/95/7973 4.3   Sodium (mmol/L)  Date Value  03/19/2024 151 (H)  12/06/2022 139  11/12/2013 141   Assessment  Erica Gomez is a 59 y.o. female presenting with respiratory failure. PMH significant for COPD, asthma, CAD, seizures, and bipolar disorder. Pt has been hyperkalemic up to 6.1 on 1/17. Pharmacy has been consulted to monitor and replace electrolytes.  Diet: Tube feeds 55 mL/hr + free water  30mL q4h >> 100 mL q4h  Goal of Therapy: Electrolytes WNL  Plan:  --Na 151, consider increasing free water  flushes --K 5.3, Lokelma  10 g per tube x 1 dose --Follow-up electrolytes with AM labs tomorrow  Thank you for allowing pharmacy to be a part of this patient's care.  [name] 03/19/2024 7:38 AM

## 2024-03-19 NOTE — Evaluation (Signed)
 Physical Therapy Evaluation Patient Details Name: Erica Gomez MRN: [id number] DOB: 06-15-1965 Today's Date: 03/19/2024  History of Present Illness  Pt is a 59 year old female admitted with Acute Hypoxic and Hypercapnic Respiratory Failure, Status Asthmaticus, COPD/Asthma overlap with what appears to be poor baseline to begin with., S. Pneumo Pneumonia, Now with VAP with MRSA pneumonia, Toxic Metabolic Encephalopathy; Intubated 02/29/24, s/p trach 03/12/24    PMH significant for asthma, COPD, tobacco use disorder, bipolar disorder, seizure, polysubstance abuse, anxiety/depression, and HLD  Clinical Impression  Patient supine in bed, resting with cervical rotation towards L (vent side); eyes open, but generally unresponsive to all external stimuli.  Does not visually acknowledge therapist or track anything in external environment throughout session. Patient grossly flaccid throughout all extremities on evaluation (suspect impacted by medications); generally hypotonic and unresponsive to deep pressure, pain or kinesthetic/proprioceptive input to extremities.  ROM remains grossly WFL; no obvious contractures or skin breakdown noted to extremities. Patient currently dep/total assist +2 for all attempts at rolling, repositioning and scooting up in bed.  Unsafe/unable to tolerate progressive mobilization, OOB attempts at this time.  Will continue to assess/progress as alertness improves and patient medically appropriate to initiate.  Patient did participate with initiation of verticalization therapy this date.  Tolerated verticalization to 27 degrees x20 minutes, 12kg/13% body weight offloaded during activity.  VSS (BP 150s/90s, HR 80s, SaO2 90% on vent at FiO2 35%, PEEP 5, RR 12-14) throughout session.  Patient intermittently with eyes opened, but does not visually engage with environment despite maximal multi-modal stimulation this date.  While verticalized, did participate with cervical  rotation/repositioning; tolerating rotation to neutral without difficulty.  Additional pillows placed behind head for improved position in A/P plane.  Would benefit from skilled PT to address above deficits and promote optimal return to PLOF.; recommend post-acute PT follow up as indicated by interdisciplinary care team.          If plan is discharge home, recommend the following: Two people to help with walking and/or transfers;Two people to help with bathing/dressing/bathroom   Can travel by private vehicle        Equipment Recommendations    Recommendations for Other Services       Functional Status Assessment Patient has had a recent decline in their functional status and/or demonstrates limited ability to make significant improvements in function in a reasonable and predictable amount of time     Precautions / Restrictions Precautions Precautions: Fall Recall of Precautions/Restrictions: Impaired Precaution/Restrictions Comments: trach/vent, gtube/NPO Restrictions Weight Bearing Restrictions Per Provider Order: No      Mobility  Bed Mobility   Bed Mobility: Rolling Rolling: Total assist, +2 for physical assistance, +2 for safety/equipment              Transfers                   General transfer comment: unsafe/unable    Ambulation/Gait               General Gait Details: unsafe/unable  Stairs            Wheelchair Mobility     Tilt Bed    Modified Rankin (Stroke Patients Only)       Balance  Pertinent Vitals/Pain Pain Assessment Pain Assessment: CPOT Facial Expression: Relaxed, neutral Body Movements: Absence of movements Muscle Tension: Relaxed Compliance with ventilator (intubated pts.): N/A Vocalization (extubated pts.): N/A CPOT Total: 0    Home Living Family/patient expects to be discharged to::  [facility])                   Additional  Comments: Patient unable to provide; family not available.  Will verify with family as available    Prior Function               Mobility Comments: Patient unable to provide; family not available. Will verify with family as available       Extremity/Trunk Assessment   Upper Extremity Assessment Upper Extremity Assessment: Generalized weakness (no active movement detected bilat UEs (will continue to assess as alertness improves); 2+ edema bilat UEs; passive ROM grossly WFL)    Lower Extremity Assessment Lower Extremity Assessment: Generalized weakness (no active movement detected bilat LEs (will continue to assess as alertness improves); passive ROM grossly WFL, bilat ankles to neutral.  No clonus, generally hypotonic.  No skin redness noted to bony prominences of LEs)       Communication   Communication Communication: Impaired Factors Affecting Communication: Trach/intubated    Cognition Arousal: Suspect due to medications, Obtunded     PT - Cognitive impairments: No family/caregiver present to determine baseline                         Following commands: Impaired Following commands impaired:  (does not respond to or follow commands)     Cueing Cueing Techniques: Verbal cues, Tactile cues, Visual cues     General Comments      Exercises Other Exercises Other Exercises: Participated with passive ROM to bilat LEs and passive cervical repositioning/rotation towards R to maintain flexibility and ROM.  No purposeful response to deep pressure, pain or kinesthetic/proprioceptive stimulation during task. Other Exercises: Participated with progressive verticalization in [employer] bed--27 degrees x20 minutes, 12kg/13% body weight offloaded during activity.  Patient intermittently with eyes opened, but does not visually attend/acknowledge therapist, does not visually track external environment.   Assessment/Plan    PT Assessment Patient needs continued PT services   PT Problem List Decreased strength;Decreased range of motion;Decreased activity tolerance;Decreased balance;Decreased mobility;Decreased coordination;Decreased cognition;Decreased knowledge of use of DME;Decreased safety awareness;Decreased knowledge of precautions;Cardiopulmonary status limiting activity;Impaired tone       PT Treatment Interventions DME instruction;Gait training;Stair training;Functional mobility training;Therapeutic exercise;Neuromuscular re-education;Cognitive remediation;Therapeutic activities;Patient/family education;Balance training    PT Goals (Current goals can be found in the Care Plan section)  Acute Rehab PT Goals PT Goal Formulation: Patient unable to participate in goal setting Time For Goal Achievement: 04/02/24 Potential to Achieve Goals: Fair Additional Goals Additional Goal #1: Patient to tolerate progressive increase in vertcalization (up to 90 degrees) for pulmonary hygiene, pressure relief, joint approximation, functional alertness. Additional Goal #2: Assess and establish goals for sitting and OOB as medically appropriate and availabl.e    Frequency Min 2X/week     Co-evaluation PT/OT/SLP Co-Evaluation/Treatment: Yes Reason for Co-Treatment: Complexity of the patient's impairments (multi-system involvement);For patient/therapist safety PT goals addressed during session: Mobility/safety with mobility OT goals addressed during session: ADL's and self-care       AM-PAC PT 6 Clicks Mobility  Outcome Measure Help needed turning from your back to your side while in a flat bed without using bedrails?: Total Help needed moving from lying on your  back to sitting on the side of a flat bed without using bedrails?: Total Help needed moving to and from a bed to a chair (including a wheelchair)?: Total Help needed standing up from a chair using your arms (e.g., wheelchair or bedside chair)?: Total Help needed to walk in hospital room?: Total Help needed  climbing 3-5 steps with a railing? : Total 6 Click Score: 6    End of Session   Activity Tolerance: Patient tolerated treatment well;Patient limited by lethargy;Treatment limited secondary to medical complications (Comment) Patient left: in bed;with bed alarm set;with nursing/sitter in room Nurse Communication: Mobility status (tolerance to verticalization) PT Visit Diagnosis: Difficulty in walking, not elsewhere classified (R26.2);Muscle weakness (generalized) (M62.81);Other symptoms and signs involving the nervous system (R29.898)    Time: 8655-8570 PT Time Calculation (min) (ACUTE ONLY): 45 min   Charges:   PT Evaluation $PT Eval High Complexity: 1 High   PT General Charges $$ ACUTE PT VISIT: 1 Visit       Jocilyn Trego H. Delores, PT, DPT, NCS 03/19/24, 11:01 PM [phone]

## 2024-03-19 NOTE — Evaluation (Signed)
 Occupational Therapy Evaluation Patient Details Name: Erica Gomez MRN: [id number] DOB: 1965/12/06 Today's Date: 03/19/2024   History of Present Illness   Pt is a 59 year old female admitted with Acute Hypoxic and Hypercapnic Respiratory Failure, Status Asthmaticus, COPD/Asthma overlap with what appears to be poor baseline to begin with., S. Pneumo Pneumonia, Now with VAP with MRSA pneumonia, Toxic Metabolic Encephalopathy; Intubated 02/29/24, s/p trach 03/12/24    PMH significant for asthma, COPD, tobacco use disorder, bipolar disorder, seizure, polysubstance abuse, anxiety/depression, and HLD     Clinical Impressions Chart reviewed to date, pt greeted semi supine in bed. She does not follow commands on this date. Suspect this is due to sedative medications. Use of tilt bed to facilitate alertenss for functional activity, respiratory benefits, off loading for skin integrity, and weight bearing through BLE to promote joint approximation and muscle activation. Patient assessed with multi-modal sensory stimulation techniques, as indicated below, to optimize alertness and arousal during skilled therapy session. Pt is left in care of nurse, semi supine, all needs met. OT will continue to follow to facilitate optimal ADL/functional mobility engagement.   BP 145/75 in semi supine, 149/90 after 20 minutes in 27 degrees tilt,  VSS on vent via trach Fio2 35% PEEP 5. Pt with continuous drips of propofol  and dilaudid , weaning.   System Stimulus Response  Auditory     Calling name  No change   Command following  Does not follow commands   Tactile Light touch  No change    Noxious stimuli  Does not respond to pain   Vestibular Elevating HOB  Tolerated Tilt bed at    Rolling in bed  Tolerates, vss  Proprioceptive Passive ROM  Tolerates, vss, no change in alertness     Joint approximation   To BLE in verticalization bed- eyes open after returning to supine             If plan is discharge  home, recommend the following:   Two people to help with walking and/or transfers;Two people to help with bathing/dressing/bathroom;Supervision due to cognitive status     Functional Status Assessment   Patient has had a recent decline in their functional status and demonstrates the ability to make significant improvements in function in a reasonable and predictable amount of time.     Equipment Recommendations   None recommended by OT (defer to next venue of care)     Recommendations for Other Services         Precautions/Restrictions   Precautions Precautions: Fall Recall of Precautions/Restrictions: Impaired Precaution/Restrictions Comments: trach/vent, gtube     Mobility Bed Mobility Overal bed mobility: Needs Assistance Bed Mobility: Rolling Rolling: Total assist, +2 for physical assistance, +2 for safety/equipment              Transfers                          Balance                                           ADL either performed or assessed with clinical judgement   ADL Overall ADL's : Needs assistance/impaired  General ADL Comments: TOTAL A +2 for all ADLs at this time     Vision   Vision Assessment?: Vision impaired- to be further tested in functional context Additional Comments: no visual focus on this date     Perception         Praxis         Pertinent Vitals/Pain Pain Assessment Pain Assessment: CPOT Facial Expression: Relaxed, neutral Body Movements: Absence of movements Muscle Tension: Relaxed Compliance with ventilator (intubated pts.): Tolerating ventilator or movement Vocalization (extubated pts.): N/A CPOT Total: 0 Pain Intervention(s): Monitored during session     Extremity/Trunk Assessment Upper Extremity Assessment Upper Extremity Assessment: Generalized weakness;RUE deficits/detail;LUE deficits/detail RUE Deficits / Details:  2+ edema in B hands; PROM appears grossly WFL LUE Deficits / Details: 2+ edema in B hands; PROM appears grossly [facility]   Lower Extremity Assessment Lower Extremity Assessment: Generalized weakness       Communication Communication Communication: Impaired Factors Affecting Communication: Trach/intubated   Cognition Arousal: Suspect due to medications   Cognition: Difficult to assess             OT - Cognition Comments: Pt does not track/visually focus during session. Her eyes are open post verticalization but does not focus on any stimuli, pt is sedated with dilauded and propofol , but weaning                 Following commands: Impaired Following commands impaired: Follows one step commands inconsistently (does not follow command)     Cueing  General Comments   Cueing Techniques: Verbal cues;Tactile cues;Visual cues  pt tolerated 20 minutes in tilt bed at 30 degrees. MAP >65 throughout. Use of tilt bed to facilitate alertenss for functional activity, respiratory benefits, off loading for skin integrity, and weight bearing through BLE to promote joint approximation and muscle activation.   Exercises     Shoulder Instructions      Home Living Family/patient expects to be discharged to::  [facility]) Living Arrangements: Spouse/significant other                               Additional Comments: unsure      Prior Functioning/Environment Prior Level of Function : Patient poor historian/Family not available                    OT Problem List: Decreased strength;Decreased range of motion;Decreased activity tolerance;Impaired balance (sitting and/or standing);Decreased safety awareness;Decreased cognition;Decreased coordination;Impaired vision/perception;Decreased knowledge of use of DME or AE;Cardiopulmonary status limiting activity;Impaired UE functional use;Increased edema   OT Treatment/Interventions: Self-care/ADL training;Therapeutic  exercise;Neuromuscular education;Energy conservation;DME and/or AE instruction;Manual therapy;Patient/family education;Visual/perceptual remediation/compensation;Cognitive remediation/compensation;Therapeutic activities;Splinting;Modalities;Balance training      OT Goals(Current goals can be found in the care plan section)   Acute Rehab OT Goals OT Goal Formulation: Patient unable to participate in goal setting ADL Goals Pt Will Perform Grooming: with max assist;bed level Additional ADL Goal #1: Pt will improve ADL engagement as evidenced by visually focusing on stimuli for approx 5 seconds, 3 attempts   OT Frequency:  Min 2X/week    Co-evaluation PT/OT/SLP Co-Evaluation/Treatment: Yes     OT goals addressed during session: ADL's and self-care      AM-PAC OT 6 Clicks Daily Activity     Outcome Measure Help from another person eating meals?: Total Help from another person taking care of personal grooming?: Total Help from another person toileting, which includes using toliet, bedpan, or  urinal?: Total Help from another person bathing (including washing, rinsing, drying)?: Total Help from another person to put on and taking off regular upper body clothing?: Total Help from another person to put on and taking off regular lower body clothing?: Total 6 Click Score: 6   End of Session Equipment Utilized During Treatment: Other (comment) (tilt bed) Nurse Communication: Mobility status  Activity Tolerance:   Patient left: in bed;with call bell/phone within reach;with nursing/sitter in room  OT Visit Diagnosis: Other abnormalities of gait and mobility (R26.89);Muscle weakness (generalized) (M62.81);Other symptoms and signs involving the nervous system (R29.898);Other symptoms and signs involving cognitive function                Time: 1400-1430 OT Time Calculation (min): 30 min Charges:  OT General Charges $OT Visit: 1 Visit OT Evaluation $OT Eval High Complexity: 1 High  Therisa Sheffield, OTD OTR/L  03/19/24, 3:12 PM

## 2024-03-19 NOTE — Progress Notes (Signed)
 Pharmacy Antibiotic Note  Erica Gomez is a 59 y.o. female w/ PMH of COPD, asthma, CAD, seizures, and bipolar disorder admitted on 02/29/2024 with respiratory failure.  Pharmacy has been consulted for vancomycin  dosing.  Plan:  Adjust vancomycin  1000 mg every 12 hours Goal AUC 400-550 Expected AUC: 527, Cmin: 15.6 SCr used: 0.8, IBW, Vd 0.72  Height: 5' 6 (167.6 cm) Weight: 82.4 kg (181 lb 10.5 oz) IBW/kg (Calculated) : 59.3  Temp (24hrs), Avg:99.2 F (37.3 C), Min:98.6 F (37 C), Max:100.1 F (37.8 C)  Recent Labs  Lab 03/15/24 0245 03/16/24 0431 03/17/24 0357 03/18/24 0400 03/19/24 0345  WBC 23.1* 24.8* 19.2* 18.0* 19.0*  CREATININE 0.78 0.98 0.96 0.87 0.71    Estimated Creatinine Clearance: 82.9 mL/min (by C-G formula based on SCr of 0.71 mg/dL).    Allergies[1]  Antimicrobials this admission: 01/17 ceftriaxone   >> 01/21 01/22 cefepime  >> 01/27 01/30 vancomycin  >> 01/30 Zosyn  >> 2/1  Microbiology results: 01/26 BCx: NGTD 01/30 RCx: MRSA 01/21 RCx: rGNR / NRF 01/16 MRSA PCR: negative  Thank you for allowing pharmacy to be a part of this patients care.  Erica Gomez B Erica Gomez 03/19/2024 7:45 AM    [1]  Allergies Allergen Reactions   Flax Seed Oil [Flax Seed Oil] Hives    Pt. Self reported   Other Hives    Pt. Self reported   Trazodone Other (See Comments)    Bed wetting  Other Reaction(s): Other (See Comments)    Bed wetting

## 2024-03-19 NOTE — Plan of Care (Signed)

## 2024-03-19 NOTE — Progress Notes (Signed)
 Removed sutures from trach site. Patient tolerated well. Suture site was red with some drainage, notified nurse and doctor.

## 2024-03-19 NOTE — Progress Notes (Signed)
 "  NAME:  ADALEA HANDLER, MRN:  [id number], DOB:  [birth year], LOS: 19 ADMISSION DATE:  02/29/2024 History of Present Illness:  59 y/o F with PMH sig for asthma, COPD, tobacco use disorder, bipolar disorder, seizure, polysubstance abuse, anxiety/depression, and HLD presented to ED BIB [facility] from home for acute respiratory distress.   On Chart review, patient follows with pulmonologist; last visit with Dr. Isadora on 10/25. Meds prescribed at visit included fluticasone  furoate (Arnuity Ellipta ) 200 mcg 1 puff daily and umeclidinium-vilanterol (Anoro Ellipta ) 62.5/25 mcg 1 puff daily. Per husband, patient has been taking medications as prescribed. EMS tx included MgSO? 2 g IV, Solu-Medrol  125 mg IV, DuoNeb x1, and albuterol  x1. During transport, pt became combative/confused and received Haldol 4 mg IM and Versed  5 mg IM. Initially A&O with EMS. SpO? 94% on RA.   ED Course: Initial VS: BP 211/120, HR 142, SpO? 94% on RA. Patient was unresponsive with labored/heaving respirations and in severe resp distress. Concern for hypercapnic resp failure contributing to agitation and AMS. Pt subsequently lost ability to protect airway. Given worsening MS and resp failure, decision made for emergent intubation. Pt received ketamine  and rocuronium  and was intubated successfully without complication.   Labs/Imaging: Labs: WBC 10.7, Hgb 12.7, Hct 40.9, Plt 313. BMP: Na 141, K 4.9, Cl 106, CO? 25, BUN 14, Cr 0.81, Glu 147, Ca 8.9. LFTs WNL: AST 23, ALT 20, Alk Phos 90, Tbili <0.2, Alb 4.6. VBG c/w resp acidosis/hypercapnia (HCO? 29.8), O? sat 98.3. Lactate 1.7. CXR: no acute cardiopulm process; ETT in mid trachea, enteric tube in stomach. CT head ordered and pending for AMS. PCCM asked to admit for further workup and treatment.   Please see Significant Hospital Events section below for full detailed hospital course.  Significant Hospital Events: Including procedures, antibiotic start and stop dates in addition to other  pertinent events   1/16: Admitted to ICU for asthma/COPD overlap exacerbation with hypercapnic respiratory failure requiring emergent intubation. 03/01/24- patient failed SBT today, remains on mV. She has AKI.   Strep pneumoniae +, will continue Rocephin .  Husband at bedside I met with him and we reviewed medical plan.  03/02/24- patient had resp distress overnight.  She still has AKI but there is an interval improvement.  03/03/24- patient for SBT today.  AKI has improved this am.  She requires heavy sedation. She has family coming in this afternoon who wishes to be present for SBT. 03/04/24- patient passed SBT and was extubated but after 1 hour started to have respiratory distress with severe hypoxemia. Her daughter was present and requested re-intubation. We continue to treat her underlying Asthma COPD exacerbation with strep pneumoniae respiratory infection.  03/05/24- Patient failed SBT today with tachypnea/tachycardia/resp distress 03/06/24- Patient failed SBT and husband was present during evaluation, she was tachypneic and tachycardic. 03/07/24- Asynchronous with vent, with obstructive process/bronchospasm.  Steroids increased to 80 mg BID, Ketamine  gtt started, place A-line for frequent ABG's. 03/08/24 - paralyzed with worsening hypercapnia. Not a candidate for ECMO. Ketamine  increased to 3 mg/hour.  03/10/24- Overnight pt became hypoxic with O2 sats decreasing to 60% requiring 10 mg of iv vecuronium  x1 dose.  Due to inability to liberate from ventilator ENT consulted for possible tracheostomy placement.  CTA Chest obtained which was negative for acute PE or aortic dissection but suspicious for multifocal pneumonia  03/11/24: Remains mechanically intubated.  Overnight required vecuronium  x1 dose due to vent dyssynchrony.  Husband at bedside currently performing WUA and SBT if she fails  will need tracheostomy which is scheduled for 01/28  03/12/24:  No acute events overnight, hemodynamically stable, no  vasopressors. Tracheostomy performed by ENT. 03/13/24: No acute events overnight.  Plan for WUA/SBT as tolerated.  Hypertensive with WUA requiring Nicardipine  gtt.  With low grade fever, remove foley, no change in secretions.  Wean steroids to 40 mg daily. 03/14/24: Pt febrile and with worsening leukocytosis vancomycin  and zosyn  started  03/15/24: Pt remains mechanically intubated with vent dyssynchrony and high peak pressures in severe respiratory distress despite propofol  gtt requiring addition of dilaudid  gtt and vecuronium  x1 dose   Interim History / Subjective:  -- Patient respiratory status improved. Still with prolonged expiratory phase. Peak pressure improved. Secretions have improved.  -- Sputum with abundant MRSA. Afebrile in the last 12 hours.   Objective    Blood pressure 133/76, pulse 83, temperature 98.5 F (36.9 C), resp. rate 13, height 5' 6 (1.676 m), weight 82.4 kg, SpO2 96%.    Vent Mode: PRVC FiO2 (%):  [35 %] 35 % Set Rate:  [12 bmp] 12 bmp Vt Set:  [45 mL-450 mL] 45 mL PEEP:  [5 cmH20] 5 cmH20 Plateau Pressure:  [20 cmH20-21 cmH20] 21 cmH20   Intake/Output Summary (Last 24 hours) at 03/19/2024 1346 Last data filed at 03/19/2024 1340 Gross per 24 hour  Intake 1611.49 ml  Output 2450 ml  Net -838.51 ml   Filed Weights   03/17/24 0355 03/18/24 0500 03/19/24 0436  Weight: 74.5 kg 75.4 kg 82.4 kg    Examination: General: Trached and deeply sedated.  HEENT: Reactive pupils  CV: rrr, s1s2, no murmurs PULM: diffuse wheezing, prolonged expiratory phase but improved slightly from yesterday  GI: soft, non-tender, non-distended, BS+ Extremities: warm, no edema   Assessment and Plan  #Acute Hypoxic and Hypercapnic Respiratory Failure #Status Asthmaticus #COPD/Asthma overlap with what appears to be poor baseline to begin with.  #S. Pneumo Pneumonia #Now with VAP with MRSA pneumonia  #Toxic Metabolic Encephalopathy #Tobacco use.    Neurology : Propofol  from RASS -3  to -4. Dilaudid  drip. Intermittent paralysis. dc Oxycodone  10mg  PO Q8H scheduled for now. Lamotrigine  100mg  per tube daily.   Cardiovascular - Hypertensive as we are weaning her sedation. Amlodipine  10. Hydralazine  and labetalol  PRN.  Pulmonary - Status asthmaticus evidence by prolonged expiratory phase over the past week. This has improved drastically with high dose steroids, nebulizers, and brief paralytics. Her gas exchange is also improved. Given prolonged intubation and need for continued mechanical ventilation, she underwent tracheostomy tube placement which she tolerated well. Will continue with standing nebs, and attempt PSV today.  -continue steroids, IV methylpred 40mg  daily. Start taper to wean by 10mg  every 3 days.               -CT/PE -ve for PE             - Albuterol  neb q2H Scheduled             - Back on the Vent. Peak 28. Maintain a low resp rate of < 15 to allow  for a prolong exp phase and risk of autopeep. PH < 7.2.   - Spot lasix .    Gastrointestinal - continue tube feeds, PPI for SUP. S/p PEG tube placement.   Renal - kidney function is improved further.   Endocrine - ICU glycemic protocol  Hem/Onc - DVT prophylaxis with SubQ heparin   ID - Received Empiric Cefipime for 7 days for management of pneumonia. Now with MRSA Pneumonia             -  Vancomycin  for 10 days.   Critical care time: 43 minutes     Darrin Barn, MD [facility] 03/19/2024 1:46 PM    "

## 2024-03-19 NOTE — Progress Notes (Signed)
 Updated pts spouse and daughter at bedside regarding pts condition and current plan of care.  All questions were answered.  They were both appreciative to receive an update.  Will continue to monitor and assess pt.   Lonell Moose, AGNP  Pulmonary/Critical Care Pager [phone] (please enter 7 digits) PCCM Consult Pager [phone] (please enter 7 digits)

## 2024-03-20 LAB — CBC
HCT: 37.4 % (ref 36.0–46.0)
Hemoglobin: 10.9 g/dL — ABNORMAL LOW (ref 12.0–15.0)
MCH: 28.5 pg (ref 26.0–34.0)
MCHC: 29.1 g/dL — ABNORMAL LOW (ref 30.0–36.0)
MCV: 97.7 fL (ref 80.0–100.0)
Platelets: 359 10*3/uL (ref 150–400)
RBC: 3.83 MIL/uL — ABNORMAL LOW (ref 3.87–5.11)
RDW: 14.9 % (ref 11.5–15.5)
WBC: 11.5 10*3/uL — ABNORMAL HIGH (ref 4.0–10.5)
nRBC: 0 % (ref 0.0–0.2)

## 2024-03-20 LAB — MAGNESIUM: Magnesium: 2.5 mg/dL — ABNORMAL HIGH (ref 1.7–2.4)

## 2024-03-20 LAB — BASIC METABOLIC PANEL WITH GFR
Anion gap: 11 (ref 5–15)
BUN: 34 mg/dL — ABNORMAL HIGH (ref 6–20)
CO2: 28 mmol/L (ref 22–32)
Calcium: 9.8 mg/dL (ref 8.9–10.3)
Chloride: 111 mmol/L (ref 98–111)
Creatinine, Ser: 0.65 mg/dL (ref 0.44–1.00)
GFR, Estimated: >60 mL/min
Glucose, Bld: 201 mg/dL — ABNORMAL HIGH (ref 70–99)
Potassium: 4.9 mmol/L (ref 3.5–5.1)
Sodium: 150 mmol/L — ABNORMAL HIGH (ref 135–145)

## 2024-03-20 LAB — GLUCOSE, CAPILLARY
Glucose-Capillary: 171 mg/dL — ABNORMAL HIGH (ref 70–99)
Glucose-Capillary: 174 mg/dL — ABNORMAL HIGH (ref 70–99)
Glucose-Capillary: 194 mg/dL — ABNORMAL HIGH (ref 70–99)
Glucose-Capillary: 202 mg/dL — ABNORMAL HIGH (ref 70–99)
Glucose-Capillary: 152 mg/dL — ABNORMAL HIGH (ref 70–99)
Glucose-Capillary: 162 mg/dL — ABNORMAL HIGH (ref 70–99)

## 2024-03-20 LAB — PHOSPHORUS: Phosphorus: 2.4 mg/dL — ABNORMAL LOW (ref 2.5–4.6)

## 2024-03-20 LAB — PROCALCITONIN: Procalcitonin: 0.92 ng/mL

## 2024-03-20 MED ORDER — DEXMEDETOMIDINE HCL IN NACL 400 MCG/100ML IV SOLN: 0.0000 ug/kg/h | INTRAVENOUS | Status: DC

## 2024-03-20 MED ORDER — OXYCODONE HCL 5 MG PO TABS: 10.0000 mg | ORAL_TABLET | Freq: Four times a day (QID) | ORAL | Status: DC

## 2024-03-20 MED ORDER — DEXMEDETOMIDINE HCL IN NACL 400 MCG/100ML IV SOLN
0.0000 ug/kg/h | INTRAVENOUS | Status: AC
  Administered 2024-03-20 (×2): 0.4 ug/kg/h via INTRAVENOUS
  Administered 2024-03-21: 1 ug/kg/h via INTRAVENOUS
  Administered 2024-03-22: 0.4 ug/kg/h via INTRAVENOUS
  Filled 2024-03-20 (×3): qty 100

## 2024-03-20 MED ORDER — K PHOS MONO-SOD PHOS DI & MONO 155-852-130 MG PO TABS
500.0000 mg | ORAL_TABLET | Freq: Two times a day (BID) | ORAL | Status: AC
  Administered 2024-03-20 (×2): 500 mg
  Filled 2024-03-20 (×2): qty 2

## 2024-03-20 MED ORDER — OXYCODONE HCL 5 MG PO TABS
10.0000 mg | ORAL_TABLET | Freq: Four times a day (QID) | ORAL | Status: AC
  Administered 2024-03-20 – 2024-03-21 (×7): 10 mg
  Filled 2024-03-20 (×7): qty 2

## 2024-03-20 MED ORDER — QUETIAPINE FUMARATE 25 MG PO TABS
50.0000 mg | ORAL_TABLET | Freq: Two times a day (BID) | ORAL | Status: AC
  Administered 2024-03-20 – 2024-03-21 (×4): 50 mg
  Filled 2024-03-20 (×4): qty 2

## 2024-03-20 NOTE — Progress Notes (Signed)
 PHARMACY CONSULT NOTE - ELECTROLYTES  Pharmacy Consult for Electrolyte Monitoring and Replacement   Recent Labs: Height: 5' 6 (167.6 cm) Weight: 82.3 kg (181 lb 7 oz) IBW/kg (Calculated) : 59.3 Estimated Creatinine Clearance: 82.9 mL/min (by C-G formula based on SCr of 0.65 mg/dL).  Potassium (mmol/L)  Date Value  03/20/2024 4.9  11/12/2013 4.1   Magnesium  (mg/dL)  Date Value  97/94/7973 2.5 (H)   Calcium  (mg/dL)  Date Value  97/94/7973 9.8   Calcium , Total (mg/dL)  Date Value  90/69/7984 9.0   Albumin (g/dL)  Date Value  97/97/7973 3.0 (L)  05/12/2020 4.5  11/12/2013 3.5   Phosphorus (mg/dL)  Date Value  97/94/7973 2.4 (L)   Sodium (mmol/L)  Date Value  03/20/2024 150 (H)  12/06/2022 139  11/12/2013 141   Assessment  Erica Gomez is a 59 y.o. female presenting with respiratory failure. PMH significant for COPD, asthma, CAD, seizures, and bipolar disorder. Pt has been hyperkalemic up to 6.1 on 1/17. Pharmacy has been consulted to monitor and replace electrolytes.  Diet: Tube feeds 55 mL/hr + free water  30mL q4h >> 100 mL q4h >> 200 mL q4h >> 200 mL q2h  Goal of Therapy: Electrolytes WNL  Plan:  --Na 150, improving with increased free water  --Hyperkalemia resolved s/p Lokelma  yesterday --Phos 2.4, K Phos  Neutral 500 mg per tube BID x 2 doses --Follow-up electrolytes with AM labs tomorrow  Thank you for allowing pharmacy to be a part of this patient's care.  Rayshun Kandler B Keinan Brouillet 03/20/2024 7:46 AM

## 2024-03-20 NOTE — Progress Notes (Signed)
 Occupational Therapy Treatment Patient Details Name: Erica Gomez MRN: [id number] DOB: 06-30-1965 Today's Date: 03/20/2024   History of present illness Pt is a 59 year old female admitted with Acute Hypoxic and Hypercapnic Respiratory Failure, Status Asthmaticus, COPD/Asthma overlap with what appears to be poor baseline to begin with., S. Pneumo Pneumonia, Now with VAP with MRSA pneumonia, Toxic Metabolic Encephalopathy; Intubated 02/29/24, s/p trach 03/12/24    PMH significant for asthma, COPD, tobacco use disorder, bipolar disorder, seizure, polysubstance abuse, anxiety/depression, and HLD   OT comments  Chart reviewed, pt seen for OT tx session on this date. Pt tolerated 40 degrees tilt for 20 minutes. Patient was seen for tilt bed for verticalization to promote upright positioning for increased awareness, weight bearing/joint approximation in LE to promote muscle activation, for skin integrity and off loading, respiratory benefits, etc. No change in alertness during upright positioning, eyes do stay open throughout session on this date.  She does not follow commands, suspect this is due to sedative medications. See further details below. VSS throughout. OT will continue to follow.   System Stimulus Response  Auditory        Calling name   No change    Command following   Does not follow commands   Tactile Light touch   No change     Noxious stimuli   Does not respond to pain   Vestibular Elevating HOB   Tolerated Tilt bed at     Rolling in bed   Tolerates, vss  Proprioceptive Passive ROM   Tolerates, vss, no change in alertness        Joint approximation    To BLE in verticalization bed- eyes open after returning to supine          If plan is discharge home, recommend the following:  Two people to help with walking and/or transfers;Two people to help with bathing/dressing/bathroom;Supervision due to cognitive status   Equipment Recommendations  None recommended by OT (defer)     Recommendations for Other Services      Precautions / Restrictions Precautions Precautions: Fall Recall of Precautions/Restrictions: Impaired Precaution/Restrictions Comments: trach/vent, gtube/NPO Restrictions Weight Bearing Restrictions Per Provider Order: No       Mobility Bed Mobility Overal bed mobility: Needs Assistance Bed Mobility: Rolling Rolling: Total assist, +2 for physical assistance, +2 for safety/equipment         General bed mobility comments: Tolerated tilt bed for 20 minutes in 40 degrees, no change in alertness with positionchanges    Transfers                         Balance                                           ADL either performed or assessed with clinical judgement   ADL Overall ADL's : Needs assistance/impaired                                       General ADL Comments: TOTAL A +2 for all ADLs at this time    Extremity/Trunk Assessment Upper Extremity Assessment RUE Deficits / Details: 2+ edema in B hands; PROM appears grossly WFL, improved with PROM LUE Deficits / Details: 2+ edema in B hands; PROM appears  grossly WFL; improved with PROM            Vision       Perception     Praxis     Communication Communication Communication: Impaired Factors Affecting Communication: Trach/intubated   Cognition Arousal: Lethargic, Suspect due to medications Behavior During Therapy: Flat affect Cognition: Difficult to assess             OT - Cognition Comments: Pt does not track/visually focus during session but open throughout session; pt is sedated with dilauded and precedex                  Following commands: Impaired Following commands impaired:  (unable to follow commands)      Cueing   Cueing Techniques: Verbal cues, Gestural cues, Tactile cues, Visual cues  Exercises      Shoulder Instructions       General Comments vitals monitored throughout session     Pertinent Vitals/ Pain       Pain Assessment Pain Assessment: CPOT Facial Expression: Relaxed, neutral Body Movements: Absence of movements Muscle Tension: Relaxed Compliance with ventilator (intubated pts.): Tolerating ventilator or movement Vocalization (extubated pts.): N/A CPOT Total: 0 Pain Intervention(s): Monitored during session  Home Living                                          Prior Functioning/Environment              Frequency  Min 2X/week        Progress Toward Goals  OT Goals(current goals can now be found in the care plan section)  Progress towards OT goals: Progressing toward goals  Acute Rehab OT Goals OT Goal Formulation: Patient unable to participate in goal setting  Plan      Co-evaluation    PT/OT/SLP Co-Evaluation/Treatment: Yes Reason for Co-Treatment: Complexity of the patient's impairments (multi-system involvement);For patient/therapist safety PT goals addressed during session: Mobility/safety with mobility OT goals addressed during session: ADL's and self-care      AM-PAC OT 6 Clicks Daily Activity     Outcome Measure   Help from another person eating meals?: Total Help from another person taking care of personal grooming?: Total Help from another person toileting, which includes using toliet, bedpan, or urinal?: Total Help from another person bathing (including washing, rinsing, drying)?: Total Help from another person to put on and taking off regular upper body clothing?: Total Help from another person to put on and taking off regular lower body clothing?: Total 6 Click Score: 6    End of Session Equipment Utilized During Treatment: Other (comment) (tilt bed)  OT Visit Diagnosis: Other abnormalities of gait and mobility (R26.89);Muscle weakness (generalized) (M62.81);Other symptoms and signs involving the nervous system (R29.898);Other symptoms and signs involving cognitive function   Activity  Tolerance Patient tolerated treatment well   Patient Left in bed;with call bell/phone within reach;with nursing/sitter in room   Nurse Communication Mobility status        Time: 1351-1430 OT Time Calculation (min): 39 min  Charges: OT General Charges $OT Visit: 1 Visit OT Treatments $Therapeutic Activity: 23-37 mins Therisa Sheffield, OTD OTR/L  03/20/24, 4:26 PM

## 2024-03-20 NOTE — Progress Notes (Addendum)
 Nutrition Follow-up  DOCUMENTATION CODES:   Not applicable  INTERVENTION:    TF via PEG:    Vital 1.5 @ 55 ml/hr.    60 ml Prosource TF20 daily   200 ml free water  flush every 2 hours   Tube feeding regimen provides 2060 kcal (100% of needs), 109 grams of protein, and 1008 ml of H2O. Total free water : 3408 ml daily  NUTRITION DIAGNOSIS:   Inadequate oral intake related to inability to eat (pt sedated and ventilated) as evidenced by NPO status.  Ongoing  GOAL:   Provide needs based on ASPEN/SCCM guidelines  Met with TF  MONITOR:   TF tolerance, Vent status, Labs, Weight trends, Skin, I & O's  REASON FOR ASSESSMENT:   Ventilator    ASSESSMENT:   59 y/o female with h/o DM, COPD, seizures, lung nodules, CAD, anxiety, MDD, bipolar disorder, HLD and substance abuse who is admitted with AMS, COPD, exacerbation, PNA and sepsis.  1/28- s/p trach 1/29- s/p NGT placement (KUB reveals tip of tube in stomach), failed SBT 2/3- s/p FLUORO 18FR BALLOON RETENTION GTUBE   2/4- sutures removed from trach site  Patient is on ventilator support via trach MV: 10.7 L/min Temp (24hrs), Avg:99.4 F (37.4 C), Min:99 F (37.2 C), Max:99.9 F (37.7 C)  Reviewed I/O's: +2 L x 24 hours and +10 L since 03/06/24  UOP: 1.6 L x 24 hours  Case discussed with RN, who reports patient is doing well today. Patient was able to interact with family members earlier today. She confirmed PEG is placed and working well. Patient receiving Vital 1.5 @ 55 ml/hr via PEG.   Reviewed weights; weights have ranged from 74.5-82.3 kg over the past 7 days.   Water  flushes increased due to hypernatremia.   Per TOC notes, plan for SNF placement at discharge.   Medications reviewed and include celexa , lovenox , solumedrol, prendisone, protonix , phosphorus, miralax , and senokot.   Labs reviewed: Na: 150, Phos: 2.4 (on supplementation), CBGS: 142-250 (inpatient orders for glycemic control are none).    Diet  Order:   Diet Order             Diet NPO time specified Except for: Ice Chips  Diet effective now                   EDUCATION NEEDS:   No education needs have been identified at this time  Skin:  Skin Assessment: Reviewed RN Assessment (extravasation R arm, incision neck)  Last BM:  2/5/126 (type 6)  Height:   Ht Readings from Last 1 Encounters:  02/29/24 5' 6 (1.676 m)    Weight:   Wt Readings from Last 1 Encounters:  03/20/24 82.3 kg    Ideal Body Weight:  59 kg  BMI:  Body mass index is 29.28 kg/m.  Estimated Nutritional Needs:   Kcal:  1800-2100kcal/day  Protein:  105-120g/day  Fluid:  1.6-1.8L/day    Margery ORN, RD, LDN, CDCES Registered Dietitian III Certified Diabetes Care and Education Specialist If unable to reach this RD, please use RD Inpatient group chat on secure chat between hours of 8am-4 pm daily

## 2024-03-20 NOTE — Progress Notes (Signed)
 "  NAME:  Erica Gomez, MRN:  [id number], DOB:  16-Feb-1965, LOS: 20 ADMISSION DATE:  02/29/2024, CONSULTATION DATE:  02/29/24 REFERRING MD:  Dr. Willo, CHIEF COMPLAINT:  Acute Respiratory Distress    Brief Pt Description / Synopsis:  60 y.o. female admitted with Acute Hypoxic and Hypercapnic Respiratory Failure due to Strep Pneumoniae Pneumonia, severe COPD/Asthma Exacerbation requiring intubation and mechanical ventilation.  With failure to wean from vent, ultimately requiring Tracheostomy placement on 03/12/24.  History of Present Illness:  59 y/o F with PMH sig for asthma, COPD, tobacco use disorder, bipolar disorder, seizure, polysubstance abuse, anxiety/depression, and HLD presented to ED BIB [facility] from home for acute respiratory distress.   On Chart review, patient follows with pulmonologist; last visit with Dr. Isadora on 10/25. Meds prescribed at visit included fluticasone  furoate (Arnuity Ellipta ) 200 mcg 1 puff daily and umeclidinium-vilanterol (Anoro Ellipta ) 62.5/25 mcg 1 puff daily. Per husband, patient has been taking medications as prescribed. EMS tx included MgSO? 2 g IV, Solu-Medrol  125 mg IV, DuoNeb x1, and albuterol  x1. During transport, pt became combative/confused and received Haldol 4 mg IM and Versed  5 mg IM. Initially A&O with EMS. SpO? 94% on RA.   ED Course: Initial VS: BP 211/120, HR 142, SpO? 94% on RA. Patient was unresponsive with labored/heaving respirations and in severe resp distress. Concern for hypercapnic resp failure contributing to agitation and AMS. Pt subsequently lost ability to protect airway. Given worsening MS and resp failure, decision made for emergent intubation. Pt received ketamine  and rocuronium  and was intubated successfully without complication.   Labs/Imaging: Labs: WBC 10.7, Hgb 12.7, Hct 40.9, Plt 313. BMP: Na 141, K 4.9, Cl 106, CO? 25, BUN 14, Cr 0.81, Glu 147, Ca 8.9. LFTs WNL: AST 23, ALT 20, Alk Phos 90, Tbili <0.2, Alb 4.6. VBG c/w resp  acidosis/hypercapnia (HCO? 29.8), O? sat 98.3. Lactate 1.7. CXR: no acute cardiopulm process; ETT in mid trachea, enteric tube in stomach. CT head ordered and pending for AMS. PCCM asked to admit for further workup and treatment.  Please see Significant Hospital Events section below for full detailed hospital course.   Pertinent  Medical History   Past Medical History:  Diagnosis Date   Allergy    Anxiety    Asthma    Bilateral carpal tunnel syndrome 12/31/2018   Bipolar affective, manic (HCC) 1996   COPD (chronic obstructive pulmonary disease) (HCC)    Depression    HLD (hyperlipidemia) 03/12/2018   Lung nodule, multiple 09/26/2016   Manic depressive illness (HCC) 1996   Seizures (HCC)    Substance abuse (HCC)    Tobacco abuse 10/04/2016    Micro Data:  02/29/24 SARS-CoV-2 PCR ? negative 02/29/24 Flu PCR ? negative 02/29/24 RVP ? negative 02/29/24 BCx2 ? no growth 02/29/24 MRSA PCR ? negative 02/29/24 Strep pneumo Ur Ag + 02/29/24 Legionella Ur Ag ?  negative 02/29/24: Respiratory viral panel>> negative 03/01/24: Tracheal aspirate>>normal flora 03/05/24: Repeat tracheal aspirate>>normal flora  03/14/24: Blood x2>>negative  03/14/24: MRSA PCR>>positive  03/14/24: Resp>>abundant gram positive cocci   Antimicrobials:   Anti-infectives (From admission, onward)    Start     Dose/Rate Route Frequency Ordered Stop   03/19/24 1600  vancomycin  (VANCOCIN ) IVPB 1000 mg/200 mL premix        1,000 mg 200 mL/hr over 60 Minutes Intravenous Every 12 hours 03/19/24 0744 03/23/24 2359   03/18/24 1400  ceFAZolin  (ANCEF ) IVPB 2g/100 mL premix        2 g 200  mL/hr over 30 Minutes Intravenous To Radiology 03/17/24 1412 03/18/24 1543   03/18/24 1350  ceFAZolin  (ANCEF ) IVPB 1 g/50 mL premix        over 30 Minutes  Continuous PRN 03/18/24 1359 03/18/24 1350   03/17/24 2200  vancomycin  (VANCOREADY) IVPB 1500 mg/300 mL  Status:  Discontinued        1,500 mg 150 mL/hr over 120 Minutes  Intravenous Every 24 hours 03/17/24 0850 03/19/24 0744   03/15/24 2200  vancomycin  (VANCOREADY) IVPB 1750 mg/350 mL  Status:  Discontinued        1,750 mg 175 mL/hr over 120 Minutes Intravenous Every 24 hours 03/14/24 1927 03/17/24 0850   03/14/24 2030  vancomycin  (VANCOREADY) IVPB 1750 mg/350 mL        1,750 mg 175 mL/hr over 120 Minutes Intravenous  Once 03/14/24 1925 03/14/24 2311   03/14/24 2000  piperacillin -tazobactam (ZOSYN ) IVPB 3.375 g  Status:  Discontinued        3.375 g 12.5 mL/hr over 240 Minutes Intravenous Every 8 hours 03/14/24 1921 03/16/24 1326   03/09/24 0800  ceFEPIme  (MAXIPIME ) 2 g in sodium chloride  0.9 % 100 mL IVPB        2 g 200 mL/hr over 30 Minutes Intravenous Every 8 hours 03/09/24 0635 03/11/24 0925   03/08/24 2015  ceFEPIme  (MAXIPIME ) 2 g in sodium chloride  0.9 % 100 mL IVPB  Status:  Discontinued        2 g 200 mL/hr over 30 Minutes Intravenous Every 12 hours 03/08/24 1205 03/09/24 0635   03/06/24 0000  ceFEPIme  (MAXIPIME ) 2 g in sodium chloride  0.9 % 100 mL IVPB  Status:  Discontinued        2 g 200 mL/hr over 30 Minutes Intravenous Every 8 hours 03/05/24 2329 03/08/24 1205   03/05/24 0200  cefTRIAXone  (ROCEPHIN ) 2 g in sodium chloride  0.9 % 100 mL IVPB  Status:  Discontinued        2 g 200 mL/hr over 30 Minutes Intravenous Every 24 hours 03/05/24 0050 03/05/24 2330   03/01/24 1000  doxycycline  (VIBRAMYCIN ) 100 mg in sodium chloride  0.9 % 250 mL IVPB  Status:  Discontinued        100 mg 125 mL/hr over 120 Minutes Intravenous Every 12 hours 03/01/24 0826 03/01/24 1003   03/01/24 0200  cefTRIAXone  (ROCEPHIN ) 1 g in sodium chloride  0.9 % 100 mL IVPB  Status:  Discontinued        1 g 200 mL/hr over 30 Minutes Intravenous Every 24 hours 03/01/24 0041 03/05/24 0050       Significant Hospital Events: Including procedures, antibiotic start and stop dates in addition to other pertinent events   1/16: Admitted to ICU for asthma/COPD overlap exacerbation with  hypercapnic respiratory failure requiring emergent intubation. 03/01/24- patient failed SBT today, remains on mV. She has AKI.   Strep pneumoniae +, will continue Rocephin .  Husband at bedside I met with him and we reviewed medical plan.  03/02/24- patient had resp distress overnight.  She still has AKI but there is an interval improvement.  03/03/24- patient for SBT today.  AKI has improved this am.  She requires heavy sedation. She has family coming in this afternoon who wishes to be present for SBT. 03/04/24- patient passed SBT and was extubated but after 1 hour started to have respiratory distress with severe hypoxemia. Her daughter was present and requested re-intubation. We continue to treat her underlying Asthma COPD exacerbation with strep pneumoniae respiratory infection.  03/05/24-  Patient failed SBT today with tachypnea/tachycardia/resp distress 03/06/24- Patient failed SBT and husband was present during evaluation, she was tachypneic and tachycardic. 03/07/24- Asynchronous with vent, with obstructive process/bronchospasm.  Steroids increased to 80 mg BID, Ketamine  gtt started, place A-line for frequent ABG's. 03/08/24 - paralyzed with worsening hypercapnia. Not a candidate for ECMO. Ketamine  increased to 3 mg/hour.  03/10/24- Overnight pt became hypoxic with O2 sats decreasing to 60% requiring 10 mg of iv vecuronium  x1 dose.  Due to inability to liberate from ventilator ENT consulted for possible tracheostomy placement.  CTA Chest obtained which was negative for acute PE or aortic dissection but suspicious for multifocal pneumonia  03/11/24: Remains mechanically intubated.  Overnight required vecuronium  x1 dose due to vent dyssynchrony.  Husband at bedside currently performing WUA and SBT if she fails will need tracheostomy which is scheduled for 01/28  03/12/24:  No acute events overnight, hemodynamically stable, no vasopressors. Tracheostomy performed by ENT. 03/13/24: No acute events overnight.  Plan  for WUA/SBT as tolerated.  Hypertensive with WUA requiring Nicardipine  gtt.  With low grade fever, remove foley, no change in secretions.  Wean steroids to 40 mg daily. 03/14/24: Pt febrile and with worsening leukocytosis vancomycin  and zosyn  started  03/15/24: Pt remains mechanically intubated with vent dyssynchrony and high peak pressures in severe respiratory distress despite propofol  gtt requiring addition of dilaudid  gtt and vecuronium  x1 dose  03/20/24: Pt sedated with precedex  gtt and currently tolerating SBT 15/5   Interim History / Subjective:  As outlined above under Significant Hospital Events section  Objective   Blood pressure (!) 155/82, pulse (!) 104, temperature 98.1 F (36.7 C), temperature source Oral, resp. rate 20, height 5' 6 (1.676 m), weight 82.3 kg, SpO2 98%.    Vent Mode: PSV FiO2 (%):  [35 %] 35 % Set Rate:  [12 bmp] 12 bmp Vt Set:  [450 mL] 450 mL PEEP:  [5 cmH20] 5 cmH20 Pressure Support:  [10 cmH20] 10 cmH20 Plateau Pressure:  [14 cmH20-21 cmH20] 14 cmH20   Intake/Output Summary (Last 24 hours) at 03/20/2024 1123 Last data filed at 03/20/2024 1006 Gross per 24 hour  Intake 3878.49 ml  Output 1550 ml  Net 2328.49 ml   Filed Weights   03/18/24 0500 03/19/24 0436 03/20/24 0500  Weight: 75.4 kg 82.4 kg 82.3 kg    Examination: General: Acutely ill-appearing female, lying in bed, sedated, on MV via tracheostomy HENT: Atraumatic, normocephalic, neck supple, no JVD, Tracheostomy midline clean and intact Lungs: Faint rhonchi throughout, even, non labored   Cardiovascular: Sinus tachycardia, no m/r/g, 2+ radial/2+ distal pulses, 1+ bilateral upper extremity edema  Abdomen: +BS x4, soft, nontender, non distended Extremities: Normal bulk and tone, no deformities Neuro: Sedated, withdraws from pain, opens eyes spontaneously but not following commands, PERRL GU: External catheter draining yellow urine   Resolved Hospital Problem list     Assessment & Plan:    #Acute metabolic encephalopathy #Sedation needs in setting of mechanical ventilation PMHx: Seizure disorder, anxiety, depression, bipolar disorder, polysubstance abuse  CT Head 01/26: Normal head CT. No acute intracranial abnormality.  - Maintain a RASS goal of 0 to -1 - PAD protocol to maintain RASS goal: will discontinue propofol  gtt and start precedex  gtt and continue dilaudid  gtt  - Avoid sedating medications as able - Daily wake up assessment - Continue home lamictal  100 mg daily - Continue oxycodone  and seroquel  per tube   #HTN Hx: HLD  - Continuous telemetry monitoring  - Continue scheduled amlodipine  and  clonidine   - Prn iv push labetalol  and/or hydralazine  for bp management   #Acute hypoxic hypercapnic respiratory failure #Strep Pneumoniae pneumonia~treated  #VAP: MRSA pneumonia #COPD/Asthma exacerbation  PMHx: Tobacco use  CTA Chest 03/10/24:  Negative for acute pulmonary embolus or aortic dissection. Bilateral lower lobe partial consolidations with diffuse bilateral peribronchovascular mild ground-glass disease and small nodules, findings are suspicious for multifocal pneumonia. Imaging follow-up to resolution is recommended Multi vessel coronary vascular calcification. - Status post Tracheostomy 03/12/24 - Full vent support, implement lung protective strategies - Plateau pressures less than 30 cm H20 - Wean FiO2 & PEEP as tolerated to maintain O2 sats 88 to 92% - Follow intermittent Chest X-ray & ABG as needed - SBT or TCT when respiratory parameters met and mental status permits - Implement VAP Bundle - Continue scheduled bronchodilator therapy  - Continue steroid taper   #Strep Pneumoniae Pneumonia~TREATED  #MRSA pneumonia  - Trend WBC's and monitor fever curve  - Follow cultures as above - Continue vancomycin  stop date 02/08  #Anemia without signs of active bleeding  - Trend CBC  - Monitor for s/sx of bleeding  - Transfuse for hgb <7  #Hyperglycemia -  CBG's q4h; Target range of 140 to 180 - SSI - Follow ICU Hypo/Hyperglycemia protocol  Best Practice (right click and Reselect all SmartList Selections daily)   Diet/type: tubefeeds and NPO DVT prophylaxis: SCD's GI prophylaxis: PPI Lines: N/A Foley:  N/A Code Status:  full code Last date of multidisciplinary goals of care discussion [03/20/24]  02/05: Pts spouse updated at bedside regarding pts condition and current plan of care  Labs   CBC: Recent Labs  Lab 03/16/24 0431 03/17/24 0357 03/18/24 0400 03/19/24 0345 03/20/24 0420  WBC 24.8* 19.2* 18.0* 19.0* 11.5*  HGB 10.2* 9.1* 9.1* 9.5* 10.9*  HCT 33.0* 30.0* 29.7* 31.1* 37.4  MCV 95.1 98.0 97.4 94.8 97.7  PLT 365 361 392 416* 359    Basic Metabolic Panel: Recent Labs  Lab 03/16/24 0431 03/17/24 0357 03/18/24 0400 03/19/24 0345 03/19/24 0753 03/20/24 0420  NA 144 148* 152* 151* 154* 150*  K 5.0 4.9 5.1 5.3* 5.0 4.9  CL 108 113* 116* 113* 115* 111  CO2 23 25 28 30 30 28   GLUCOSE 259* 239* 182* 135* 121* 201*  BUN 39* 44* 46* 41* 38* 34*  CREATININE 0.98 0.96 0.87 0.71 0.68 0.65  CALCIUM  9.8 9.9 10.1 10.0 10.2 9.8  MG 2.2 2.4 2.4 2.4  --  2.5*  PHOS 4.2 3.2 1.4* 4.3  --  2.4*   GFR: Estimated Creatinine Clearance: 82.9 mL/min (by C-G formula based on SCr of 0.65 mg/dL). Recent Labs  Lab 03/17/24 0357 03/18/24 0400 03/19/24 0345 03/20/24 0420  PROCALCITON  --   --   --  0.92  WBC 19.2* 18.0* 19.0* 11.5*    Liver Function Tests: Recent Labs  Lab 03/14/24 0045 03/15/24 0245 03/16/24 0431 03/17/24 0357  ALBUMIN 3.2* 3.1* 3.1* 3.0*   No results for input(s): LIPASE, AMYLASE in the last 168 hours. No results for input(s): AMMONIA in the last 168 hours.  ABG    Component Value Date/Time   PHART 7.41 03/19/2024 0931   PCO2ART 49 (H) 03/19/2024 0931   PO2ART 76 (L) 03/19/2024 0931   HCO3 31.1 (H) 03/19/2024 0931   ACIDBASEDEF 1.9 03/16/2024 1143   O2SAT 97.1 03/19/2024 0931      Coagulation Profile: Recent Labs  Lab 03/18/24 0400  INR 1.1    Cardiac Enzymes: No results for  input(s): CKTOTAL, CKMB, CKMBINDEX, TROPONINI in the last 168 hours.   HbA1C: Hemoglobin A1C  Date/Time Value Ref Range Status  09/21/2021 02:00 PM 6.4 (A) 4.0 - 5.6 % Final  03/22/2021 09:52 AM 6.2 (A) 4.0 - 5.6 % Final  01/15/2014 12:00 AM 6.8  Final   HbA1c, POC (prediabetic range)  Date/Time Value Ref Range Status  05/11/2020 10:30 AM 0 (A) 5.7 - 6.4 % Final   HbA1c, POC (controlled diabetic range)  Date/Time Value Ref Range Status  05/11/2020 10:30 AM 0.0 0.0 - 7.0 % Final   HbA1c POC (<> result, manual entry)  Date/Time Value Ref Range Status  05/11/2020 10:30 AM 6.5 4.0 - 5.6 % Final   Hgb A1c MFr Bld  Date/Time Value Ref Range Status  03/01/2024 02:02 AM 6.4 (H) 4.8 - 5.6 % Final    Comment:    (NOTE) Diagnosis of Diabetes The following HbA1c ranges recommended by the American Diabetes Association (ADA) may be used as an aid in the diagnosis of diabetes mellitus.  Hemoglobin             Suggested A1C NGSP%              Diagnosis  <5.7                   Non Diabetic  5.7-6.4                Pre-Diabetic  >6.4                   Diabetic  <7.0                   Glycemic control for                       adults with diabetes.    12/24/2019 11:08 AM 6.7 (H) 4.8 - 5.6 % Final    Comment:             Prediabetes: 5.7 - 6.4          Diabetes: >6.4          Glycemic control for adults with diabetes: <7.0     CBG: Recent Labs  Lab 03/19/24 1921 03/19/24 2309 03/20/24 0314 03/20/24 0729 03/20/24 1120  GLUCAP 185* 148* 171* 174* 194*    Review of Systems:   Unable to assess due to intubation/sedation/critical illness   Past Medical History:  She,  has a past medical history of Allergy, Anxiety, Asthma, Bilateral carpal tunnel syndrome (12/31/2018), Bipolar affective, manic (HCC) (1996), COPD (chronic obstructive pulmonary disease) (HCC),  Depression, HLD (hyperlipidemia) (03/12/2018), Lung nodule, multiple (09/26/2016), Manic depressive illness (HCC) (1996), Seizures (HCC), Substance abuse (HCC), and Tobacco abuse (10/04/2016).   Surgical History:   Past Surgical History:  Procedure Laterality Date   ABDOMINAL HYSTERECTOMY     CARPAL TUNNEL RELEASE Right 01/15/2019   Procedure: RIGHTCARPAL TUNNEL RELEASE, LEFT DEQUERVAINS INJECTION;  Surgeon: Jerri Kay HERO, MD;  Location: [facility];  Service: Orthopedics;  Laterality: Right;   CARPAL TUNNEL RELEASE Left 02/26/2019   Procedure: LEFT CARPAL TUNNEL RELEASE;  Surgeon: Jerri Kay HERO, MD;  Location: [facility];  Service: Orthopedics;  Laterality: Left;  Bier block   COLONOSCOPY WITH PROPOFOL  N/A 11/28/2018   Procedure: COLONOSCOPY WITH PROPOFOL ;  Surgeon: Therisa Bi, MD;  Location: [facility];  Service: Gastroenterology;  Laterality: N/A;   IR GASTROSTOMY TUBE MOD SED  03/18/2024  TRACHEOSTOMY TUBE PLACEMENT N/A 03/12/2024   Procedure: CREATION, TRACHEOSTOMY;  Surgeon: Milissa Hamming, MD;  Location: [facility];  Service: ENT;  Laterality: N/A;  Trach in place   TUBAL LIGATION       Social History:   reports that she has been smoking cigarettes. Her smokeless tobacco use includes chew and snuff. She reports that she does not currently use alcohol. She reports that she does not use drugs.   Family History:  Her family history includes Bipolar disorder in her brother and brother; CAD in her father; Heart attack in her father; Other in her mother; Schizophrenia in her brother and brother; Stroke in her mother.   Allergies Allergies[1]   Home Medications  Prior to Admission medications  Medication Sig Start Date End Date Taking? Authorizing Provider  albuterol  (PROVENTIL  HFA) 108 (90 Base) MCG/ACT inhaler Inhale 2 puffs into the lungs every 4-6 hours as needed. 09/21/21  Yes Iloabachie, Chioma E, NP  amphetamine-dextroamphetamine (ADDERALL XR) 10 MG 24  hr capsule Take 10 mg by mouth daily.   Yes [provider]  citalopram  (CELEXA ) 40 MG tablet Take 1 tablet (40 mg total) by mouth daily. 11/21/22  Yes   Fluticasone  Furoate (ARNUITY ELLIPTA ) 200 MCG/ACT AEPB Inhale 1 puff into the lungs daily. 12/06/23  Yes Dgayli, Belva, MD  hydrOXYzine  (ATARAX ) 50 MG tablet Take 1-1.5 tablets (50-75 mg total) by mouth at bedtime as needed for sleep 11/21/22  Yes   lamoTRIgine  (LAMICTAL ) 100 MG tablet Take 1 tablet (100 mg total) by mouth daily. 11/21/22  Yes   rosuvastatin  (CRESTOR ) 10 MG tablet Take 10 mg by mouth at bedtime. 08/13/23  Yes [provider]  traMADol (ULTRAM) 50 MG tablet Take 1 tablet by mouth every 6 (six) hours as needed.   Yes [provider]  umeclidinium-vilanterol (ANORO ELLIPTA ) 62.5-25 MCG/ACT AEPB Inhale 1 puff into the lungs daily. 12/06/23  Yes Dgayli, Belva, MD  ipratropium-albuterol  (DUONEB) 0.5-2.5 (3) MG/3ML SOLN INHALE CONTENTS OF ONE VIAL ( TOTAL) USING NEBULIZER  ONCE EVERY 6 HOURS. 11/02/20 12/06/23  Iloabachie, Chioma E, NP  atomoxetine  (STRATTERA ) 25 MG capsule Take 1 capsule (25 mg total) by mouth once every evening after dinner. Patient not taking: Reported on 12/23/2020 10/26/20 03/09/21    loratadine  (CLARITIN ) 10 MG tablet Take 1 tablet (10 mg total) by mouth daily as needed for allergies. 12/16/19 12/25/19  Iloabachie, Chioma E, NP     Critical care time: 40 minutes    Lonell Moose, AGNP  Pulmonary/Critical Care Pager [phone] (please enter 7 digits) PCCM Consult Pager [phone] (please enter 7 digits)            [1]  Allergies Allergen Reactions   Flax Seed Oil [Flax Seed Oil] Hives    Pt. Self reported   Other Hives    Pt. Self reported   Trazodone Other (See Comments)    Bed wetting  Other Reaction(s): Other (See Comments)    Bed wetting   "

## 2024-03-20 NOTE — TOC Progression Note (Signed)
 Transition of Care [facility]) - Progression Note    Patient Details  Name: Erica Gomez MRN: [id number] Date of Birth: [birth year]  [facility] CM/SW Contact  [name], LCSW Phone Number: 03/20/2024, 1:15 PM  Clinical Narrative:    Chart reviewed. SW was able to speak with the patient husband at bedside today and followed up in patient insurance. The patient husband reports that LTACH isnt an option any more  due to insurance. The patient reports that he is trying to place the patient in a SNF for long term care. The patient husband reports that h e has spoken to [facility] and the application is processing. The patient husband informed me that the patient has gotten declined by medicaid in the past.   The patient husband reports that he would like to get POA over the patient but it has been challenging without the patient being able to speak. The patient husband reports he would like to speak with a chaplin.   SW will reach out to the on duty chaplin to visit the patient room when available.                      Expected Discharge Plan and Services                                               Social Drivers of Health (SDOH) Interventions SDOH Screenings   Food Insecurity: Patient Unable To Answer (02/29/2024)  Housing: Patient Unable To Answer (02/29/2024)  Transportation Needs: Patient Unable To Answer (02/29/2024)  Utilities: Patient Unable To Answer (02/29/2024)  Tobacco Use: High Risk (02/29/2024)    Readmission Risk Interventions     No data to display

## 2024-03-20 NOTE — Progress Notes (Signed)
 Called to evaluate drainage and erythema from tracheostomy site.  Per nursing, it was previously foul smelling.  Patient has been diagnosed with MRSA in sputum since tracheostomy was placed.  Exam: GEN-  supine in NAD, responds to light NECK-  tracheostomy tube in place.  Clear secretions with scant purulence.  Mild erythema from previous stitch sites.  Culture taken.  Impression:  s/p Tracheostomy with some mild erythema and drainage  Plan:  I think the drainage has improved since yesterday/this morning as amount is somewhat normal.  I suspect there was possibly a coexisting MRSA infection that has improved with the Vancomycin  treating the MRSA in her sputum.  Will recheck again tomorrow and follow culture results.

## 2024-03-20 NOTE — Progress Notes (Signed)
 Physical Therapy Treatment Patient Details Name: ANASTAZJA ISAAC MRN: [id number] DOB: 1965/08/19 Today's Date: 03/20/2024   History of Present Illness Pt is a 59 year old female admitted with Acute Hypoxic and Hypercapnic Respiratory Failure, Status Asthmaticus, COPD/Asthma overlap with what appears to be poor baseline to begin with., S. 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 The patient continues to have low level of alertness despite multi modal sensory stimulation provided with ROM of UE, upright positioning, voice, light touch, etc. Recommend to continue PT to maximize independence and decrease caregiver burden. PT follow up recommended after this hospital stay.    If plan is discharge home, recommend the following: Two people to help with walking and/or transfers;Two people to help with bathing/dressing/bathroom   Can travel by private vehicle        Equipment Recommendations   (TBD at next level of care)    Recommendations for Other Services       Precautions / Restrictions Precautions Precautions: Fall Recall of Precautions/Restrictions: Impaired Precaution/Restrictions Comments: trach/vent, gtube/NPO Restrictions Weight Bearing Restrictions Per Provider Order: No     Mobility  Bed Mobility               General bed mobility comments: Total A for all mobility efforts. Patient placed on tilt bed for verticalization to promote alertness, upright positioning, respiratory benefits,  skin integrity, and LE weight bearing/joint approximation for muscle activation. multi modal sensory stimulation provided with tilt at 40 degrees for 20 minutes. No significant increased in alertess with multi modal sensory stimulation Start Time: 1355 Angle: 40 degrees Total Minutes in Angle: 20 minutes Patient Response: Flat affect  Transfers                        Ambulation/Gait                   Stairs             Wheelchair Mobility     Tilt Bed Tilt Bed Patient on Tilt Bed?: Yes Start Time: 1355 Angle: 40 degrees Total Minutes in Angle: 20 minutes Patient Response: Flat affect  Modified Rankin (Stroke Patients Only)       Balance                                            Communication Communication Communication: Impaired Factors Affecting Communication: Trach/intubated  Cognition Arousal: Lethargic, Suspect due to medications Behavior During Therapy: Flat affect   PT - Cognitive impairments: Difficult to assess Difficult to assess due to: Level of arousal, Tracheostomy                     PT - Cognition Comments: Patient is unable to follow commands. No attempts at communication. Following commands: Impaired Following commands impaired:  (unable to follow commands)    Cueing Cueing Techniques: Verbal cues, Gestural cues, Tactile cues, Visual cues  Exercises  General Comments General comments (skin integrity, edema, etc.): vitals monitored throughout session      Pertinent Vitals/Pain Pain Assessment Facial Expression: Relaxed, neutral Body Movements: Absence of movements Muscle Tension: Relaxed Compliance with ventilator (intubated pts.): Tolerating ventilator or movement Vocalization (extubated pts.): N/A CPOT Total: 0    Home Living                          Prior Function            PT Goals (current goals can now be found in the care plan section) Acute Rehab PT  Goals Patient Stated Goal: unable to participate with goal setting PT Goal Formulation: Patient unable to participate in goal setting Time For Goal Achievement: 04/02/24 Potential to Achieve Goals: Fair Progress towards PT goals: Progressing toward goals    Frequency    Min 2X/week      PT Plan      Co-evaluation   Reason for Co-Treatment: Complexity of the patient's impairments (multi-system involvement);For patient/therapist safety PT goals addressed during session: Mobility/safety with mobility OT goals addressed during session: ADL's and self-care      AM-PAC PT 6 Clicks Mobility   Outcome Measure  Help needed turning from your back to your side while in a flat bed without using bedrails?: Total Help needed moving from lying on your back to sitting on the side of a flat bed without using bedrails?: Total Help needed moving to and from a bed to a chair (including a wheelchair)?: Total Help needed standing up from a chair using your arms (e.g., wheelchair or bedside chair)?: Total Help needed to walk in hospital room?: Total Help needed climbing 3-5 steps with a railing? : Total 6 Click Score: 6    End of Session Equipment Utilized During Treatment:  (trach/vent) Activity Tolerance: Patient tolerated treatment well;Patient limited by lethargy;Treatment limited secondary to medical complications (Comment) Patient left: in bed;with nursing/sitter in room;with SCD's reapplied Nurse Communication: Mobility status PT Visit Diagnosis: Difficulty in walking, not elsewhere classified (R26.2);Muscle weakness (generalized) (M62.81);Other symptoms and signs involving the nervous system (R29.898)     Time: 1352-1430 PT Time Calculation (min) (ACUTE ONLY): 38 min  Charges:    $Therapeutic Activity: 8-22 mins PT General Charges $$ ACUTE PT VISIT: 1 Visit                     Randine Essex, PT, MPT   Randine LULLA Essex 03/20/2024, 3:00 PM

## 2024-03-20 NOTE — Progress Notes (Addendum)
 66 Husband in for patient's wake up assessment. Husband did more mouth care and requested tracheostomy gauze be changed. Husband also ask for 2 towels to prop patient's head in the middle position. 0900 Patient opened eyes and made eye contact with daughter and husband. 0930 D.Nelson NP in to talk with husband. 1000 Patient making eye contact with husband and daughter. 1400 Per PT - tilt bed used-see PT note. Patient non interactive with flat affect. And eyes closed. 1600 All of respiratory equipment changed out from tracheostomy inner canula trach ties,trach dressings to ventilator and suction tubing. Patient bagged per second RT while ventilator tubings changed. 1715 Husband in to visit.

## 2024-03-21 ENCOUNTER — Other Ambulatory Visit: Payer: Self-pay

## 2024-03-21 LAB — CBC
HCT: 32.1 % — ABNORMAL LOW (ref 36.0–46.0)
Hemoglobin: 10.4 g/dL — ABNORMAL LOW (ref 12.0–15.0)
MCH: 28.9 pg (ref 26.0–34.0)
MCHC: 32.4 g/dL (ref 30.0–36.0)
MCV: 89.2 fL (ref 80.0–100.0)
Platelets: 426 10*3/uL — ABNORMAL HIGH (ref 150–400)
RBC: 3.6 MIL/uL — ABNORMAL LOW (ref 3.87–5.11)
RDW: 13.5 % (ref 11.5–15.5)
WBC: 12.3 10*3/uL — ABNORMAL HIGH (ref 4.0–10.5)
nRBC: 0.2 % (ref 0.0–0.2)

## 2024-03-21 LAB — BASIC METABOLIC PANEL WITH GFR
Anion gap: 12 (ref 5–15)
BUN: 24 mg/dL — ABNORMAL HIGH (ref 6–20)
CO2: 30 mmol/L (ref 22–32)
Calcium: 9.5 mg/dL (ref 8.9–10.3)
Chloride: 98 mmol/L (ref 98–111)
Creatinine, Ser: 0.62 mg/dL (ref 0.44–1.00)
GFR, Estimated: >60 mL/min
Glucose, Bld: 256 mg/dL — ABNORMAL HIGH (ref 70–99)
Potassium: 4.7 mmol/L (ref 3.5–5.1)
Sodium: 140 mmol/L (ref 135–145)

## 2024-03-21 LAB — GLUCOSE, CAPILLARY
Glucose-Capillary: 165 mg/dL — ABNORMAL HIGH (ref 70–99)
Glucose-Capillary: 169 mg/dL — ABNORMAL HIGH (ref 70–99)
Glucose-Capillary: 178 mg/dL — ABNORMAL HIGH (ref 70–99)
Glucose-Capillary: 180 mg/dL — ABNORMAL HIGH (ref 70–99)
Glucose-Capillary: 246 mg/dL — ABNORMAL HIGH (ref 70–99)
Glucose-Capillary: 195 mg/dL — ABNORMAL HIGH (ref 70–99)
Glucose-Capillary: 193 mg/dL — ABNORMAL HIGH (ref 70–99)
Glucose-Capillary: 158 mg/dL — ABNORMAL HIGH (ref 70–99)

## 2024-03-21 LAB — CULTURE, BLOOD (ROUTINE X 2)
Culture: NO GROWTH
Culture: NO GROWTH

## 2024-03-21 LAB — AEROBIC/ANAEROBIC CULTURE W GRAM STAIN (SURGICAL/DEEP WOUND)
Gram Stain: NONE SEEN
Special Requests: NORMAL

## 2024-03-21 LAB — PHOSPHORUS: Phosphorus: 3.1 mg/dL (ref 2.5–4.6)

## 2024-03-21 LAB — MAGNESIUM: Magnesium: 2 mg/dL (ref 1.7–2.4)

## 2024-03-21 LAB — PROCALCITONIN: Procalcitonin: 0.73 ng/mL

## 2024-03-21 MED ORDER — NYSTATIN 100000 UNIT/ML MT SUSP
5.0000 mL | Freq: Four times a day (QID) | OROMUCOSAL | Status: AC
  Administered 2024-03-21 (×2): 500000 [IU] via ORAL
  Filled 2024-03-21 (×3): qty 5

## 2024-03-21 MED ORDER — SODIUM CHLORIDE 0.9% FLUSH
10.0000 mL | Freq: Two times a day (BID) | INTRAVENOUS | Status: AC
  Administered 2024-03-21: 20 mL

## 2024-03-21 MED ORDER — FUROSEMIDE 10 MG/ML IJ SOLN
40.0000 mg | Freq: Once | INTRAMUSCULAR | Status: AC
  Administered 2024-03-21: 40 mg via INTRAVENOUS
  Filled 2024-03-21: qty 4

## 2024-03-21 MED ORDER — CHLORHEXIDINE GLUCONATE CLOTH 2 % EX PADS
6.0000 | MEDICATED_PAD | Freq: Every day | CUTANEOUS | Status: AC
  Administered 2024-03-21: 6 via TOPICAL

## 2024-03-21 MED ORDER — SODIUM CHLORIDE 0.9% FLUSH: 10.0000 mL | INTRAVENOUS | Status: AC | PRN

## 2024-03-21 MED ORDER — FREE WATER
100.0000 mL | Status: AC
  Administered 2024-03-21 (×2): 100 mL

## 2024-03-21 NOTE — Progress Notes (Signed)
 Peripherally Inserted Central Catheter Placement  The IV Nurse has discussed with the patient and/or persons authorized to consent for the patient, the purpose of this procedure and the potential benefits and risks involved with this procedure.  The benefits include less needle sticks, lab draws from the catheter, and the patient may be discharged home with the catheter. Risks include, but not limited to, infection, bleeding, blood clot (thrombus formation), and puncture of an artery; nerve damage and irregular heartbeat and possibility to perform a PICC exchange if needed/ordered by physician.  Alternatives to this procedure were also discussed.  Bard Power PICC patient education guide, fact sheet on infection prevention and patient information card has been provided to patient /or left at bedside.    PICC Placement Documentation  PICC Double Lumen 03/21/24 Left Basilic 44 cm 0 cm (Active)  Indication for Insertion or Continuance of Line Prolonged intravenous therapies 03/21/24 2015  Exposed Catheter (cm) 0 cm 03/21/24 2015  Site Assessment Clean, Dry, Intact 03/21/24 2015  Lumen #1 Status Blood return noted;Flushed;Saline locked 03/21/24 2015  Lumen #2 Status Blood return noted;Flushed;Saline locked 03/21/24 2015  Dressing Type Transparent;Securing device 03/21/24 2015  Dressing Status Antimicrobial disc/dressing in place 03/21/24 2015  Line Care Connections checked and tightened 03/21/24 2015  Line Adjustment (NICU/IV Team Only) No 03/21/24 2015  Dressing Intervention New dressing;Adhesive placed at insertion site (IV team only) 03/21/24 2015  Dressing Change Due 03/28/24 03/21/24 2015       Debby Salomon CROME 03/21/2024, 8:39 PM

## 2024-03-21 NOTE — Plan of Care (Signed)
" °  Problem: Education: Goal: Knowledge of General Education information will improve Description: Including pain rating scale, medication(s)/side effects and non-pharmacologic comfort measures Outcome: Progressing   Problem: Health Behavior/Discharge Planning: Goal: Ability to manage health-related needs will improve Outcome: Progressing   Problem: Clinical Measurements: Goal: Ability to maintain clinical measurements within normal limits will improve Outcome: Progressing Goal: Will remain free from infection Outcome: Progressing Goal: Diagnostic test results will improve Outcome: Progressing Goal: Respiratory complications will improve Outcome: Progressing Goal: Cardiovascular complication will be avoided Outcome: Progressing   Problem: Activity: Goal: Risk for activity intolerance will decrease Outcome: Progressing   Problem: Nutrition: Goal: Adequate nutrition will be maintained Outcome: Progressing   Problem: Coping: Goal: Level of anxiety will decrease Outcome: Progressing   Problem: Elimination: Goal: Will not experience complications related to bowel motility Outcome: Progressing Goal: Will not experience complications related to urinary retention Outcome: Progressing   Problem: Pain Managment: Goal: General experience of comfort will improve and/or be controlled Outcome: Progressing   Problem: Safety: Goal: Ability to remain free from injury will improve Outcome: Progressing   Problem: Skin Integrity: Goal: Risk for impaired skin integrity will decrease Outcome: Progressing   Problem: Education: Goal: Ability to describe self-care measures that may prevent or decrease complications (Diabetes Survival Skills Education) will improve Outcome: Progressing Goal: Individualized Educational Video(s) Outcome: Progressing   Problem: Coping: Goal: Ability to adjust to condition or change in health will improve Outcome: Progressing   Problem: Fluid  Volume: Goal: Ability to maintain a balanced intake and output will improve Outcome: Progressing   Problem: Health Behavior/Discharge Planning: Goal: Ability to identify and utilize available resources and services will improve Outcome: Progressing Goal: Ability to manage health-related needs will improve Outcome: Progressing   Problem: Metabolic: Goal: Ability to maintain appropriate glucose levels will improve Outcome: Progressing   Problem: Nutritional: Goal: Maintenance of adequate nutrition will improve Outcome: Progressing Goal: Progress toward achieving an optimal weight will improve Outcome: Progressing   Problem: Skin Integrity: Goal: Risk for impaired skin integrity will decrease Outcome: Progressing   Problem: Tissue Perfusion: Goal: Adequacy of tissue perfusion will improve Outcome: Progressing   Problem: Activity: Goal: Ability to tolerate increased activity will improve Outcome: Progressing   Problem: Respiratory: Goal: Ability to maintain a clear airway and adequate ventilation will improve Outcome: Progressing   Problem: Role Relationship: Goal: Method of communication will improve Outcome: Progressing   Problem: Activity: Goal: Ability to tolerate increased activity will improve Outcome: Progressing   Problem: Clinical Measurements: Goal: Ability to maintain a body temperature in the normal range will improve Outcome: Progressing   Problem: Respiratory: Goal: Ability to maintain adequate ventilation will improve Outcome: Progressing Goal: Ability to maintain a clear airway will improve Outcome: Progressing   "

## 2024-03-21 NOTE — Plan of Care (Signed)
" °  Problem: Clinical Measurements: Goal: Ability to maintain clinical measurements within normal limits will improve Outcome: Progressing Goal: Will remain free from infection Outcome: Progressing Goal: Diagnostic test results will improve Outcome: Progressing Goal: Respiratory complications will improve Outcome: Progressing Goal: Cardiovascular complication will be avoided Outcome: Progressing   Problem: Activity: Goal: Risk for activity intolerance will decrease Outcome: Progressing   Problem: Nutrition: Goal: Adequate nutrition will be maintained Outcome: Progressing   Problem: Coping: Goal: Level of anxiety will decrease Outcome: Progressing   Problem: Elimination: Goal: Will not experience complications related to bowel motility Outcome: Progressing Goal: Will not experience complications related to urinary retention Outcome: Progressing   Problem: Pain Managment: Goal: General experience of comfort will improve and/or be controlled Outcome: Progressing   Problem: Safety: Goal: Ability to remain free from injury will improve Outcome: Progressing   Problem: Skin Integrity: Goal: Risk for impaired skin integrity will decrease Outcome: Progressing   Problem: Fluid Volume: Goal: Ability to maintain a balanced intake and output will improve Outcome: Progressing   Problem: Metabolic: Goal: Ability to maintain appropriate glucose levels will improve Outcome: Progressing   Problem: Nutritional: Goal: Maintenance of adequate nutrition will improve Outcome: Progressing Goal: Progress toward achieving an optimal weight will improve Outcome: Progressing   Problem: Skin Integrity: Goal: Risk for impaired skin integrity will decrease Outcome: Progressing   Problem: Tissue Perfusion: Goal: Adequacy of tissue perfusion will improve Outcome: Progressing   Problem: Activity: Goal: Ability to tolerate increased activity will improve Outcome: Progressing   Problem:  Respiratory: Goal: Ability to maintain a clear airway and adequate ventilation will improve Outcome: Progressing   Problem: Activity: Goal: Ability to tolerate increased activity will improve Outcome: Progressing   Problem: Clinical Measurements: Goal: Ability to maintain a body temperature in the normal range will improve Outcome: Progressing   Problem: Respiratory: Goal: Ability to maintain adequate ventilation will improve Outcome: Progressing Goal: Ability to maintain a clear airway will improve Outcome: Progressing   "

## 2024-03-21 NOTE — TOC Progression Note (Signed)
 Transition of Care [facility]) - Progression Note    Patient Details  Name: Erica Gomez MRN: [id number] Date of Birth: [birth year]  Transition [facility] Care [facility]) CM/SW Contact  [name], LCSW Phone Number: 03/21/2024, 12:27 PM  Clinical Narrative:    Chart reviewed. SW has called potential SNF options that take patient with Trach.   Health Care: No bed availability until after the weekend.   [facility] does not accept patient with trach.   SW left a message [facility] [phone]) and [facility] [phone]) to call back.                       Expected Discharge Plan and Services                                               Social Drivers of Health (SDOH) Interventions SDOH Screenings   Food Insecurity: Patient Unable To Answer (02/29/2024)  Housing: Patient Unable To Answer (02/29/2024)  Transportation Needs: Patient Unable To Answer (02/29/2024)  Utilities: Patient Unable To Answer (02/29/2024)  Tobacco Use: High Risk (02/29/2024)    Readmission Risk Interventions     No data to display

## 2024-03-21 NOTE — Progress Notes (Addendum)
 Occupational Therapy Treatment Patient Details Name: Erica Gomez MRN: [id number] DOB: 06-06-1965 Today's Date: 03/21/2024   History of present illness Pt is a 59 year old female admitted with Acute Hypoxic and Hypercapnic Respiratory Failure, Status Asthmaticus, COPD/Asthma overlap with what appears to be poor baseline to begin with., S. 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 Pt tolerated 37 degrees in tilt for 13 minutes. Systolic in 140s pre tilt, to 879d after 13 minutes. HR to 130s bpm with coughing, 120s bpm during mobility, 109 bpm at rest. Patient was seen for tilt bed for verticalization to promote upright positioning for increased awareness, weight bearing/joint approximation in LE to promote muscle activation, for skin integrity and off loading, respiratory benefits, etc. No change in alertness during upright positioning, eyes are closed throughout. She does not follow commands, suspect this is due to sedative medications. See further details below. VSS throughout. Trach/vent, all lines/leads intact pre/post session. Tube feeds paused for mobility. OT will continue to follow.   System Stimulus Response  Auditory        Calling name   No change    Command following   Does not follow commands   Tactile Light touch   No change     Noxious stimuli   Does not respond to pain   Vestibular Elevating HOB   Tolerated Tilt bed at     Rolling in bed   Tolerates, vss  Proprioceptive Passive ROM   Tolerates, vss, no change in alertness        Joint approximation    To BLE in verticalization bed         If plan is discharge home, recommend the following:  Two people to help with walking  and/or transfers;Two people to help with bathing/dressing/bathroom;Supervision due to cognitive status   Equipment Recommendations  None recommended by OT (defer)    Recommendations for Other Services      Precautions / Restrictions Precautions Precautions: Fall Recall of Precautions/Restrictions: Impaired Precaution/Restrictions Comments: trach/vent, gtube/NPO Restrictions Weight Bearing Restrictions Per Provider Order: No       Mobility Bed Mobility Overal bed mobility: Needs Assistance Bed Mobility: Rolling Rolling: Total assist, +2 for physical assistance, +2 for safety/equipment         General bed mobility comments: tolerated tilt bed for 13 minutes in 37 degrees, no change in alertness with position changes    Transfers                         Balance                                           ADL either performed or assessed with clinical judgement   ADL Overall ADL's : Needs assistance/impaired                                       General ADL Comments: TOTAL A +2 for all ADLs at this time    Extremity/Trunk Assessment Upper  Extremity Assessment RUE Deficits / Details: 1+ edema in B hands LUE Deficits / Details: 1+ edema in B hands            Vision   Vision Assessment?: Vision impaired- to be further tested in functional context Additional Comments: no visual focus on this date   Perception     Praxis     Communication Communication Communication: Impaired Factors Affecting Communication: Trach/intubated   Cognition Arousal: Lethargic, Suspect due to medications Behavior During Therapy: Flat affect Cognition: Difficult to assess             OT - Cognition Comments: Pt does not track/visually focus during session but open throughout session; pt is sedated with dilauded and precedex                  Following commands: Impaired Following commands impaired:  (unable to follow  commands)      Cueing   Cueing Techniques: Verbal cues, Gestural cues, Tactile cues, Visual cues  Exercises      Shoulder Instructions       General Comments vitals monitored throughout session    Pertinent Vitals/ Pain       Pain Assessment Pain Assessment: CPOT Facial Expression: Relaxed, neutral Body Movements: Protection Muscle Tension: Relaxed Compliance with ventilator (intubated pts.): Coughing but tolerating Vocalization (extubated pts.): N/A CPOT Total: 2 Pain Intervention(s): Monitored during session  Home Living                                          Prior Functioning/Environment              Frequency  Min 2X/week        Progress Toward Goals  OT Goals(current goals can now be found in the care plan section)  Progress towards OT goals: Progressing toward goals  Acute Rehab OT Goals OT Goal Formulation: Patient unable to participate in goal setting  Plan      Co-evaluation    PT/OT/SLP Co-Evaluation/Treatment: Yes Reason for Co-Treatment: Complexity of the patient's impairments (multi-system involvement);For patient/therapist safety   OT goals addressed during session: ADL's and self-care      AM-PAC OT 6 Clicks Daily Activity     Outcome Measure   Help from another person eating meals?: Total Help from another person taking care of personal grooming?: Total Help from another person toileting, which includes using toliet, bedpan, or urinal?: Total Help from another person bathing (including washing, rinsing, drying)?: Total Help from another person to put on and taking off regular upper body clothing?: Total Help from another person to put on and taking off regular lower body clothing?: Total 6 Click Score: 6    End of Session Equipment Utilized During Treatment: Other (comment) (tilt bed)  OT Visit Diagnosis: Other abnormalities of gait and mobility (R26.89);Muscle weakness (generalized) (M62.81);Other  symptoms and signs involving the nervous system (R29.898);Other symptoms and signs involving cognitive function   Activity Tolerance Patient tolerated treatment well   Patient Left in bed;with call bell/phone within reach;with nursing/sitter in room   Nurse Communication Mobility status        Time: 1441-1520 OT Time Calculation (min): 39 min  Charges: OT General Charges $OT Visit: 1 Visit OT Treatments $Therapeutic Activity: 8-22 mins  Therisa Sheffield, OTD OTR/L  03/21/24, 3:49 PM

## 2024-03-21 NOTE — Progress Notes (Signed)
 Physical Therapy Treatment Patient Details Name: Erica Gomez MRN: [id number] DOB: [birth year] Today's Date: 03/21/2024   History of Present Illness Pt is a 59 year old female admitted with Acute Hypoxic and Hypercapnic Respiratory Failure, Status Asthmaticus, COPD/Asthma overlap with what appears to be poor baseline to begin with., S. 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 Did not open eyes to command, noxious stimuli. Eyes opened with coughing with bed tilted, eyes returned to closed. Pt tolerated 37 degrees in tilt for 13 minutes. Systolic in 140s pre tilt, to 879d after 13 minutes. HR to 130s bpm with coughing, 120s bpm during mobility, 109 bpm at rest. VSS throughout. Trach/vent, all lines/leads intact pre/post session. Tube feeds paused for mobility. TotalAx2 for all mobility.   System Stimulus Response  Auditory        Calling name   No change    Command following   Does not follow commands   Tactile Light touch   No change     Noxious stimuli   Does not respond to pain   Vestibular Elevating HOB   Tolerated Tilt bed at 37deg    Rolling in bed   Tolerates, vss  Proprioceptive Passive ROM   Tolerates, vss, no change in alertness        Joint approximation    To BLE in verticalization bed       If plan is discharge home, recommend the following: Two people to help with walking and/or transfers;Two people to help with bathing/dressing/bathroom   Can travel by private vehicle        Equipment Recommendations  Other (comment) (TBD)    Recommendations for Other Services       Precautions / Restrictions Precautions Precautions: Fall Recall of Precautions/Restrictions: Impaired Precaution/Restrictions  Comments: trach/vent, gtube/NPO Restrictions Weight Bearing Restrictions Per Provider Order: No     Mobility  Bed Mobility Overal bed mobility: Needs Assistance Bed Mobility: Rolling Rolling: Total assist, +2 for physical assistance, +2 for safety/equipment         General bed mobility comments: tolerated tilt bed for 13 minutes in 37 degrees, no change in alertness with position changes    Transfers                        Ambulation/Gait                   Stairs             Wheelchair Mobility     Tilt Bed    Modified Rankin (Stroke Patients Only)       Balance                                            Communication Communication Communication: Impaired Factors Affecting Communication: Trach/intubated  Cognition Arousal: Lethargic, Suspect due to medications Behavior During Therapy: Flat affect   PT - Cognitive impairments: Difficult to assess Difficult to assess due to: Level of arousal, Tracheostomy                       Following commands: Impaired Following commands  impaired:  (unable to follow commands)    Cueing Cueing Techniques: Verbal cues, Gestural cues, Tactile cues, Visual cues  Exercises      General Comments General comments (skin integrity, edema, etc.): vitals monitored throughout session      Pertinent Vitals/Pain Pain Assessment Pain Assessment: CPOT Facial Expression: Relaxed, neutral Body Movements: Protection Muscle Tension: Relaxed Compliance with ventilator (intubated pts.): Coughing but tolerating Vocalization (extubated pts.): N/A CPOT Total: 2 Pain Intervention(s): Monitored during session    Home Living                          Prior Function            PT Goals (current goals can now be found in the care plan section) Progress towards PT goals: Progressing toward goals    Frequency    Min 2X/week      PT Plan      Co-evaluation  PT/OT/SLP Co-Evaluation/Treatment: Yes Reason for Co-Treatment: Complexity of the patient's impairments (multi-system involvement);For patient/therapist safety PT goals addressed during session: Mobility/safety with mobility OT goals addressed during session: ADL's and self-care      AM-PAC PT 6 Clicks Mobility   Outcome Measure  Help needed turning from your back to your side while in a flat bed without using bedrails?: Total Help needed moving from lying on your back to sitting on the side of a flat bed without using bedrails?: Total Help needed moving to and from a bed to a chair (including a wheelchair)?: Total Help needed standing up from a chair using your arms (e.g., wheelchair or bedside chair)?: Total Help needed to walk in hospital room?: Total Help needed climbing 3-5 steps with a railing? : Total 6 Click Score: 6    End of Session   Activity Tolerance: Patient tolerated treatment well;Patient limited by lethargy Patient left: in bed;with SCD's reapplied (OT at bedside) Nurse Communication: Mobility status PT Visit Diagnosis: Difficulty in walking, not elsewhere classified (R26.2);Muscle weakness (generalized) (M62.81);Other symptoms and signs involving the nervous system (R29.898)     Time: 8558-8494 PT Time Calculation (min) (ACUTE ONLY): 24 min  Charges:    $Therapeutic Activity: 8-22 mins PT General Charges $$ ACUTE PT VISIT: 1 Visit                     Doyal Shams PT, DPT 4:03 PM,03/21/24

## 2024-03-21 NOTE — Progress Notes (Signed)
 "  NAME:  Erica Gomez, MRN:  [id number], DOB:  1966-02-13, LOS: 21 ADMISSION DATE:  02/29/2024, CONSULTATION DATE:  02/29/24 REFERRING MD:  Dr. Willo, CHIEF COMPLAINT:  Acute Respiratory Distress    Brief Pt Description / Synopsis:  59 y.o. female admitted with Acute Hypoxic and Hypercapnic Respiratory Failure due to Strep Pneumoniae Pneumonia, severe COPD/Asthma Exacerbation requiring intubation and mechanical ventilation.  With failure to wean from vent, ultimately requiring Tracheostomy placement on 03/12/24.  History of Present Illness:  59 y/o F with PMH sig for asthma, COPD, tobacco use disorder, bipolar disorder, seizure, polysubstance abuse, anxiety/depression, and HLD presented to ED BIB [facility] from home for acute respiratory distress.      03/21/24- patient being treated for VAP.  She has poor IV acess.  Unable to get approval for LTACH due to insurance.  Patient still critically ill on MV.  Her secretions are severe and she's struggling to wean down FiO2 due to inspissated secretions.    Pertinent  Medical History   Past Medical History:  Diagnosis Date   Allergy    Anxiety    Asthma    Bilateral carpal tunnel syndrome 12/31/2018   Bipolar affective, manic (HCC) 1996   COPD (chronic obstructive pulmonary disease) (HCC)    Depression    HLD (hyperlipidemia) 03/12/2018   Lung nodule, multiple 09/26/2016   Manic depressive illness (HCC) 1996   Seizures (HCC)    Substance abuse (HCC)    Tobacco abuse 10/04/2016    Micro Data:  02/29/24 SARS-CoV-2 PCR ? negative 02/29/24 Flu PCR ? negative 02/29/24 RVP ? negative 02/29/24 BCx2 ? no growth 02/29/24 MRSA PCR ? negative 02/29/24 Strep pneumo Ur Ag + 02/29/24 Legionella Ur Ag ?  negative 02/29/24: Respiratory viral panel>> negative 03/01/24: Tracheal aspirate>>normal flora 03/05/24: Repeat tracheal aspirate>>normal flora  03/14/24: Blood x2>>negative  03/14/24: MRSA PCR>>positive  03/14/24: Resp>>abundant gram positive cocci    Antimicrobials:   Anti-infectives (From admission, onward)    Start     Dose/Rate Route Frequency Ordered Stop   03/19/24 1600  vancomycin  (VANCOCIN ) IVPB 1000 mg/200 mL premix        1,000 mg 200 mL/hr over 60 Minutes Intravenous Every 12 hours 03/19/24 0744 03/23/24 2359   03/18/24 1400  ceFAZolin  (ANCEF ) IVPB 2g/100 mL premix        2 g 200 mL/hr over 30 Minutes Intravenous To Radiology 03/17/24 1412 03/18/24 1543   03/18/24 1350  ceFAZolin  (ANCEF ) IVPB 1 g/50 mL premix        over 30 Minutes  Continuous PRN 03/18/24 1359 03/18/24 1350   03/17/24 2200  vancomycin  (VANCOREADY) IVPB 1500 mg/300 mL  Status:  Discontinued        1,500 mg 150 mL/hr over 120 Minutes Intravenous Every 24 hours 03/17/24 0850 03/19/24 0744   03/15/24 2200  vancomycin  (VANCOREADY) IVPB 1750 mg/350 mL  Status:  Discontinued        1,750 mg 175 mL/hr over 120 Minutes Intravenous Every 24 hours 03/14/24 1927 03/17/24 0850   03/14/24 2030  vancomycin  (VANCOREADY) IVPB 1750 mg/350 mL        1,750 mg 175 mL/hr over 120 Minutes Intravenous  Once 03/14/24 1925 03/14/24 2311   03/14/24 2000  piperacillin -tazobactam (ZOSYN ) IVPB 3.375 g  Status:  Discontinued        3.375 g 12.5 mL/hr over 240 Minutes Intravenous Every 8 hours 03/14/24 1921 03/16/24 1326   03/09/24 0800  ceFEPIme  (MAXIPIME ) 2 g in sodium chloride  0.9 %  100 mL IVPB        2 g 200 mL/hr over 30 Minutes Intravenous Every 8 hours 03/09/24 0635 03/11/24 0925   03/08/24 2015  ceFEPIme  (MAXIPIME ) 2 g in sodium chloride  0.9 % 100 mL IVPB  Status:  Discontinued        2 g 200 mL/hr over 30 Minutes Intravenous Every 12 hours 03/08/24 1205 03/09/24 0635   03/06/24 0000  ceFEPIme  (MAXIPIME ) 2 g in sodium chloride  0.9 % 100 mL IVPB  Status:  Discontinued        2 g 200 mL/hr over 30 Minutes Intravenous Every 8 hours 03/05/24 2329 03/08/24 1205   03/05/24 0200  cefTRIAXone  (ROCEPHIN ) 2 g in sodium chloride  0.9 % 100 mL IVPB  Status:  Discontinued        2  g 200 mL/hr over 30 Minutes Intravenous Every 24 hours 03/05/24 0050 03/05/24 2330   03/01/24 1000  doxycycline  (VIBRAMYCIN ) 100 mg in sodium chloride  0.9 % 250 mL IVPB  Status:  Discontinued        100 mg 125 mL/hr over 120 Minutes Intravenous Every 12 hours 03/01/24 0826 03/01/24 1003   03/01/24 0200  cefTRIAXone  (ROCEPHIN ) 1 g in sodium chloride  0.9 % 100 mL IVPB  Status:  Discontinued        1 g 200 mL/hr over 30 Minutes Intravenous Every 24 hours 03/01/24 0041 03/05/24 0050       Significant Hospital Events: Including procedures, antibiotic start and stop dates in addition to other pertinent events   1/16: Admitted to ICU for asthma/COPD overlap exacerbation with hypercapnic respiratory failure requiring emergent intubation. 03/01/24- patient failed SBT today, remains on mV. She has AKI.   Strep pneumoniae +, will continue Rocephin .  Husband at bedside I met with him and we reviewed medical plan.  03/02/24- patient had resp distress overnight.  She still has AKI but there is an interval improvement.  03/03/24- patient for SBT today.  AKI has improved this am.  She requires heavy sedation. She has family coming in this afternoon who wishes to be present for SBT. 03/04/24- patient passed SBT and was extubated but after 1 hour started to have respiratory distress with severe hypoxemia. Her daughter was present and requested re-intubation. We continue to treat her underlying Asthma COPD exacerbation with strep pneumoniae respiratory infection.  03/05/24- Patient failed SBT today with tachypnea/tachycardia/resp distress 03/06/24- Patient failed SBT and husband was present during evaluation, she was tachypneic and tachycardic. 03/07/24- Asynchronous with vent, with obstructive process/bronchospasm.  Steroids increased to 80 mg BID, Ketamine  gtt started, place A-line for frequent ABG's. 03/08/24 - paralyzed with worsening hypercapnia. Not a candidate for ECMO. Ketamine  increased to 3 mg/hour.  03/10/24-  Overnight pt became hypoxic with O2 sats decreasing to 60% requiring 10 mg of iv vecuronium  x1 dose.  Due to inability to liberate from ventilator ENT consulted for possible tracheostomy placement.  CTA Chest obtained which was negative for acute PE or aortic dissection but suspicious for multifocal pneumonia  03/11/24: Remains mechanically intubated.  Overnight required vecuronium  x1 dose due to vent dyssynchrony.  Husband at bedside currently performing WUA and SBT if she fails will need tracheostomy which is scheduled for 01/28  03/12/24:  No acute events overnight, hemodynamically stable, no vasopressors. Tracheostomy performed by ENT. 03/13/24: No acute events overnight.  Plan for WUA/SBT as tolerated.  Hypertensive with WUA requiring Nicardipine  gtt.  With low grade fever, remove foley, no change in secretions.  Wean steroids to 40  mg daily. 03/14/24: Pt febrile and with worsening leukocytosis vancomycin  and zosyn  started  03/15/24: Pt remains mechanically intubated with vent dyssynchrony and high peak pressures in severe respiratory distress despite propofol  gtt requiring addition of dilaudid  gtt and vecuronium  x1 dose  03/20/24: Pt sedated with precedex  gtt and currently tolerating SBT 15/5   Interim History / Subjective:  As outlined above under Significant Hospital Events section  Objective   Blood pressure (!) 152/87, pulse (!) 102, temperature 99.7 F (37.6 C), temperature source Axillary, resp. rate (!) 23, height 5' 6 (1.676 m), weight 84.8 kg, SpO2 96%.    Vent Mode: PRVC FiO2 (%):  [30 %-35 %] 30 % Set Rate:  [12 bmp] 12 bmp Vt Set:  [450 mL] 450 mL PEEP:  [5 cmH20] 5 cmH20 Pressure Support:  [10 cmH20] 10 cmH20 Plateau Pressure:  [17 cmH20] 17 cmH20   Intake/Output Summary (Last 24 hours) at 03/21/2024 0805 Last data filed at 03/21/2024 0700 Gross per 24 hour  Intake 5436.04 ml  Output 1225 ml  Net 4211.04 ml   Filed Weights   03/19/24 0436 03/20/24 0500 03/21/24 0435   Weight: 82.4 kg 82.3 kg 84.8 kg    Examination: General: Acutely ill-appearing female, lying in bed, sedated, on MV via tracheostomy HENT: Atraumatic, normocephalic, neck supple, no JVD, Tracheostomy midline clean and intact Lungs: Faint rhonchi throughout, even, non labored   Cardiovascular: Sinus tachycardia, no m/r/g, 2+ radial/2+ distal pulses, 1+ bilateral upper extremity edema  Abdomen: +BS x4, soft, nontender, non distended Extremities: Normal bulk and tone, no deformities Neuro: Sedated, withdraws from pain, opens eyes spontaneously but not following commands, PERRL GU: External catheter draining yellow urine    Assessment & Plan:     #Acute hypoxic hypercapnic respiratory failure #Strep Pneumoniae pneumonia~treated  #VAP: MRSA pneumonia #COPD/Asthma exacerbation  PMHx: Tobacco use  CTA Chest 03/10/24:  Negative for acute pulmonary embolus or aortic dissection. Bilateral lower lobe partial consolidations with diffuse bilateral peribronchovascular mild ground-glass disease and small nodules, findings are suspicious for multifocal pneumonia. Imaging follow-up to resolution is recommended Multi vessel coronary vascular calcification. - Status post Tracheostomy 03/12/24 - Full vent support, implement lung protective strategies - Plateau pressures less than 30 cm H20 - Wean FiO2 & PEEP as tolerated to maintain O2 sats 88 to 92% - Follow intermittent Chest X-ray & ABG as needed - SBT or TCT when respiratory parameters met and mental status permits - Implement VAP Bundle - Continue scheduled bronchodilator therapy  - Continue steroid taper     PMHx: Seizure disorder, anxiety, depression, bipolar disorder, polysubstance abuse  CT Head 01/26: Normal head CT. No acute intracranial abnormality.  - Maintain a RASS goal of 0 to -1 - PAD protocol to maintain RASS goal: will discontinue propofol  gtt and start precedex  gtt and continue dilaudid  gtt  - Avoid sedating medications as  able - Daily wake up assessment - Continue home lamictal  100 mg daily - Continue oxycodone  and seroquel  per tube   #HTN Hx: HLD  - Continuous telemetry monitoring  - Continue scheduled amlodipine  and clonidine   - Prn iv push labetalol  and/or hydralazine  for bp management  #Strep Pneumoniae Pneumonia~TREATED  #MRSA pneumonia  - Trend WBC's and monitor fever curve  - Follow cultures as above - Continue vancomycin  stop date 02/08  #Anemia without signs of active bleeding  - Trend CBC  - Monitor for s/sx of bleeding  - Transfuse for hgb <7  #Hyperglycemia - CBG's q4h; Target range of 140 to  180 - SSI - Follow ICU Hypo/Hyperglycemia protocol  Best Practice (right click and Reselect all SmartList Selections daily)   Diet/type: tubefeeds and NPO DVT prophylaxis: SCD's GI prophylaxis: PPI Lines: N/A Foley:  N/A Code Status:  full code Last date of multidisciplinary goals of care discussion [03/20/24]   Labs   CBC: Recent Labs  Lab 03/16/24 0431 03/17/24 0357 03/18/24 0400 03/19/24 0345 03/20/24 0420  WBC 24.8* 19.2* 18.0* 19.0* 11.5*  HGB 10.2* 9.1* 9.1* 9.5* 10.9*  HCT 33.0* 30.0* 29.7* 31.1* 37.4  MCV 95.1 98.0 97.4 94.8 97.7  PLT 365 361 392 416* 359    Basic Metabolic Panel: Recent Labs  Lab 03/16/24 0431 03/17/24 0357 03/18/24 0400 03/19/24 0345 03/19/24 0753 03/20/24 0420  NA 144 148* 152* 151* 154* 150*  K 5.0 4.9 5.1 5.3* 5.0 4.9  CL 108 113* 116* 113* 115* 111  CO2 23 25 28 30 30 28   GLUCOSE 259* 239* 182* 135* 121* 201*  BUN 39* 44* 46* 41* 38* 34*  CREATININE 0.98 0.96 0.87 0.71 0.68 0.65  CALCIUM  9.8 9.9 10.1 10.0 10.2 9.8  MG 2.2 2.4 2.4 2.4  --  2.5*  PHOS 4.2 3.2 1.4* 4.3  --  2.4*   GFR: Estimated Creatinine Clearance: 84.1 mL/min (by C-G formula based on SCr of 0.65 mg/dL). Recent Labs  Lab 03/17/24 0357 03/18/24 0400 03/19/24 0345 03/20/24 0420  PROCALCITON  --   --   --  0.92  WBC 19.2* 18.0* 19.0* 11.5*    Liver  Function Tests: Recent Labs  Lab 03/15/24 0245 03/16/24 0431 03/17/24 0357  ALBUMIN 3.1* 3.1* 3.0*   No results for input(s): LIPASE, AMYLASE in the last 168 hours. No results for input(s): AMMONIA in the last 168 hours.  ABG    Component Value Date/Time   PHART 7.41 03/19/2024 0931   PCO2ART 49 (H) 03/19/2024 0931   PO2ART 76 (L) 03/19/2024 0931   HCO3 31.1 (H) 03/19/2024 0931   ACIDBASEDEF 1.9 03/16/2024 1143   O2SAT 97.1 03/19/2024 0931     Coagulation Profile: Recent Labs  Lab 03/18/24 0400  INR 1.1    Cardiac Enzymes: No results for input(s): CKTOTAL, CKMB, CKMBINDEX, TROPONINI in the last 168 hours.   HbA1C: Hemoglobin A1C  Date/Time Value Ref Range Status  09/21/2021 02:00 PM 6.4 (A) 4.0 - 5.6 % Final  03/22/2021 09:52 AM 6.2 (A) 4.0 - 5.6 % Final  01/15/2014 12:00 AM 6.8  Final   HbA1c, POC (prediabetic range)  Date/Time Value Ref Range Status  05/11/2020 10:30 AM 0 (A) 5.7 - 6.4 % Final   HbA1c, POC (controlled diabetic range)  Date/Time Value Ref Range Status  05/11/2020 10:30 AM 0.0 0.0 - 7.0 % Final   HbA1c POC (<> result, manual entry)  Date/Time Value Ref Range Status  05/11/2020 10:30 AM 6.5 4.0 - 5.6 % Final   Hgb A1c MFr Bld  Date/Time Value Ref Range Status  03/01/2024 02:02 AM 6.4 (H) 4.8 - 5.6 % Final    Comment:    (NOTE) Diagnosis of Diabetes The following HbA1c ranges recommended by the American Diabetes Association (ADA) may be used as an aid in the diagnosis of diabetes mellitus.  Hemoglobin             Suggested A1C NGSP%              Diagnosis  <5.7  Non Diabetic  5.7-6.4                Pre-Diabetic  >6.4                   Diabetic  <7.0                   Glycemic control for                       adults with diabetes.    12/24/2019 11:08 AM 6.7 (H) 4.8 - 5.6 % Final    Comment:             Prediabetes: 5.7 - 6.4          Diabetes: >6.4          Glycemic control for adults with  diabetes: <7.0     CBG: Recent Labs  Lab 03/20/24 1913 03/20/24 2303 03/21/24 0306 03/21/24 0348 03/21/24 0739  GLUCAP 152* 162* 165* 169* 178*    Review of Systems:   Unable to assess due to intubation/sedation/critical illness   Past Medical History:  She,  has a past medical history of Allergy, Anxiety, Asthma, Bilateral carpal tunnel syndrome (12/31/2018), Bipolar affective, manic (HCC) (1996), COPD (chronic obstructive pulmonary disease) (HCC), Depression, HLD (hyperlipidemia) (03/12/2018), Lung nodule, multiple (09/26/2016), Manic depressive illness (HCC) (1996), Seizures (HCC), Substance abuse (HCC), and Tobacco abuse (10/04/2016).   Surgical History:   Past Surgical History:  Procedure Laterality Date   ABDOMINAL HYSTERECTOMY     CARPAL TUNNEL RELEASE Right 01/15/2019   Procedure: RIGHTCARPAL TUNNEL RELEASE, LEFT DEQUERVAINS INJECTION;  Surgeon: Jerri Kay HERO, MD;  Location: [facility];  Service: Orthopedics;  Laterality: Right;   CARPAL TUNNEL RELEASE Left 02/26/2019   Procedure: LEFT CARPAL TUNNEL RELEASE;  Surgeon: Jerri Kay HERO, MD;  Location: [facility];  Service: Orthopedics;  Laterality: Left;  Bier block   COLONOSCOPY WITH PROPOFOL  N/A 11/28/2018   Procedure: COLONOSCOPY WITH PROPOFOL ;  Surgeon: Therisa Bi, MD;  Location: [facility];  Service: Gastroenterology;  Laterality: N/A;   IR GASTROSTOMY TUBE MOD SED  03/18/2024   TRACHEOSTOMY TUBE PLACEMENT N/A 03/12/2024   Procedure: CREATION, TRACHEOSTOMY;  Surgeon: Milissa Hamming, MD;  Location: [facility];  Service: ENT;  Laterality: N/A;  Trach in place   TUBAL LIGATION       Social History:   reports that she has been smoking cigarettes. Her smokeless tobacco use includes chew and snuff. She reports that she does not currently use alcohol. She reports that she does not use drugs.   Family History:  Her family history includes Bipolar disorder in her brother and brother; CAD in her  father; Heart attack in her father; Other in her mother; Schizophrenia in her brother and brother; Stroke in her mother.   Allergies Allergies[1]   Home Medications  Prior to Admission medications  Medication Sig Start Date End Date Taking? Authorizing Provider  albuterol  (PROVENTIL  HFA) 108 (90 Base) MCG/ACT inhaler Inhale 2 puffs into the lungs every 4-6 hours as needed. 09/21/21  Yes Iloabachie, Chioma E, NP  amphetamine-dextroamphetamine (ADDERALL XR) 10 MG 24 hr capsule Take 10 mg by mouth daily.   Yes [provider]  citalopram  (CELEXA ) 40 MG tablet Take 1 tablet (40 mg total) by mouth daily. 11/21/22  Yes   Fluticasone  Furoate (ARNUITY ELLIPTA ) 200 MCG/ACT AEPB Inhale 1 puff into the lungs daily. 12/06/23  Yes Isadora Hose, MD  hydrOXYzine  (ATARAX ) 50 MG tablet Take 1-1.5 tablets (50-75 mg total) by mouth at bedtime as needed for sleep 11/21/22  Yes   lamoTRIgine  (LAMICTAL ) 100 MG tablet Take 1 tablet (100 mg total) by mouth daily. 11/21/22  Yes   rosuvastatin  (CRESTOR ) 10 MG tablet Take 10 mg by mouth at bedtime. 08/13/23  Yes [provider]  traMADol (ULTRAM) 50 MG tablet Take 1 tablet by mouth every 6 (six) hours as needed.   Yes [provider]  umeclidinium-vilanterol (ANORO ELLIPTA ) 62.5-25 MCG/ACT AEPB Inhale 1 puff into the lungs daily. 12/06/23  Yes Dgayli, Belva, MD  ipratropium-albuterol  (DUONEB) 0.5-2.5 (3) MG/3ML SOLN INHALE CONTENTS OF ONE VIAL ( TOTAL) USING NEBULIZER  ONCE EVERY 6 HOURS. 11/02/20 12/06/23  Iloabachie, Chioma E, NP  atomoxetine  (STRATTERA ) 25 MG capsule Take 1 capsule (25 mg total) by mouth once every evening after dinner. Patient not taking: Reported on 12/23/2020 10/26/20 03/09/21    loratadine  (CLARITIN ) 10 MG tablet Take 1 tablet (10 mg total) by mouth daily as needed for allergies. 12/16/19 12/25/19  Iloabachie, Chioma E, NP     Critical care provider statement:   Total critical care time: 33 minutes   Performed by:  Parris MD   Critical care time was exclusive of separately billable procedures and treating other patients.   Critical care was necessary to treat or prevent imminent or life-threatening deterioration.   Critical care was time spent personally by me on the following activities: development of treatment plan with patient and/or surrogate as well as nursing, discussions with consultants, evaluation of patient's response to treatment, examination of patient, obtaining history from patient or surrogate, ordering and performing treatments and interventions, ordering and review of laboratory studies, ordering and review of radiographic studies, pulse oximetry and re-evaluation of patient's condition.    Katelin Kutsch, M.D.  Pulmonary & Critical Care Medicine               [1]  Allergies Allergen Reactions   Flax Seed Oil [Flax Seed Oil] Hives    Pt. Self reported   Other Hives    Pt. Self reported   Trazodone Other (See Comments)    Bed wetting  Other Reaction(s): Other (See Comments)    Bed wetting   "

## 2024-06-09 ENCOUNTER — Encounter

## 2024-06-09 ENCOUNTER — Ambulatory Visit: Admitting: Student in an Organized Health Care Education/Training Program
